# Patient Record
Sex: Male | Born: 1940
Health system: Southern US, Community
[De-identification: ages and names within clinical notes are randomized; demographics above are authoritative.]

## PROBLEM LIST (undated history)

## (undated) DIAGNOSIS — M549 Dorsalgia, unspecified: Secondary | ICD-10-CM

## (undated) DIAGNOSIS — J309 Allergic rhinitis, unspecified: Secondary | ICD-10-CM

## (undated) DIAGNOSIS — J449 Chronic obstructive pulmonary disease, unspecified: Secondary | ICD-10-CM

## (undated) DIAGNOSIS — N400 Enlarged prostate without lower urinary tract symptoms: Secondary | ICD-10-CM

## (undated) DIAGNOSIS — G4733 Obstructive sleep apnea (adult) (pediatric): Secondary | ICD-10-CM

## (undated) DIAGNOSIS — R220 Localized swelling, mass and lump, head: Secondary | ICD-10-CM

## (undated) DIAGNOSIS — R221 Localized swelling, mass and lump, neck: Secondary | ICD-10-CM

## (undated) DIAGNOSIS — D4102 Neoplasm of uncertain behavior of left kidney: Secondary | ICD-10-CM

## (undated) DIAGNOSIS — J45909 Unspecified asthma, uncomplicated: Secondary | ICD-10-CM

## (undated) DIAGNOSIS — M109 Gout, unspecified: Secondary | ICD-10-CM

## (undated) DIAGNOSIS — E119 Type 2 diabetes mellitus without complications: Secondary | ICD-10-CM

## (undated) DIAGNOSIS — I10 Essential (primary) hypertension: Secondary | ICD-10-CM

## (undated) DIAGNOSIS — C61 Malignant neoplasm of prostate: Secondary | ICD-10-CM

## (undated) DIAGNOSIS — R5381 Other malaise: Secondary | ICD-10-CM

## (undated) DIAGNOSIS — K219 Gastro-esophageal reflux disease without esophagitis: Secondary | ICD-10-CM

## (undated) DIAGNOSIS — R5383 Other fatigue: Secondary | ICD-10-CM

## (undated) DIAGNOSIS — Z86010 Personal history of colonic polyps: Secondary | ICD-10-CM

## (undated) DIAGNOSIS — E785 Hyperlipidemia, unspecified: Secondary | ICD-10-CM

## (undated) HISTORY — DX: Gastro-esophageal reflux disease without esophagitis: K21.9

## (undated) HISTORY — DX: Other malaise: R53.81

## (undated) HISTORY — DX: Chronic obstructive pulmonary disease, unspecified: J44.9

## (undated) HISTORY — DX: Allergic rhinitis, unspecified: J30.9

## (undated) HISTORY — DX: Type 2 diabetes mellitus without complications: E11.9

## (undated) HISTORY — PX: APPENDECTOMY: SHX54

## (undated) HISTORY — DX: Malignant neoplasm of prostate: C61

## (undated) HISTORY — DX: Hyperlipidemia, unspecified: E78.5

## (undated) HISTORY — DX: Other fatigue: R53.83

## (undated) HISTORY — DX: Essential (primary) hypertension: I10

## (undated) HISTORY — DX: Gout, unspecified: M10.9

## (undated) HISTORY — DX: Obstructive sleep apnea (adult) (pediatric): G47.33

## (undated) HISTORY — DX: Localized swelling, mass and lump, neck: R22.1

## (undated) HISTORY — DX: Personal history of colonic polyps: Z86.010

## (undated) HISTORY — DX: Benign prostatic hyperplasia without lower urinary tract symptoms: N40.0

## (undated) HISTORY — DX: Localized swelling, mass and lump, head: R22.0

## (undated) HISTORY — DX: Dorsalgia, unspecified: M54.9

## (undated) HISTORY — DX: Unspecified asthma, uncomplicated: J45.909

---

## 2003-11-19 ENCOUNTER — Encounter: Admission: RE | Admit: 2003-11-19 | Discharge: 2003-11-19 | Payer: Self-pay | Admitting: Internal Medicine

## 2003-11-21 ENCOUNTER — Encounter: Admission: RE | Admit: 2003-11-21 | Discharge: 2003-11-21 | Payer: Self-pay | Admitting: Internal Medicine

## 2004-04-01 ENCOUNTER — Emergency Department: Payer: Self-pay | Admitting: Emergency Medicine

## 2004-04-10 ENCOUNTER — Ambulatory Visit: Payer: Self-pay | Admitting: Gastroenterology

## 2004-04-24 ENCOUNTER — Ambulatory Visit: Payer: Self-pay | Admitting: Gastroenterology

## 2004-04-24 ENCOUNTER — Ambulatory Visit: Payer: Self-pay | Admitting: Internal Medicine

## 2004-05-16 ENCOUNTER — Emergency Department: Payer: Self-pay | Admitting: Emergency Medicine

## 2004-08-06 ENCOUNTER — Emergency Department: Payer: Self-pay | Admitting: Emergency Medicine

## 2004-09-09 ENCOUNTER — Emergency Department: Payer: Self-pay | Admitting: Emergency Medicine

## 2004-09-18 ENCOUNTER — Ambulatory Visit: Payer: Self-pay | Admitting: Internal Medicine

## 2005-01-02 ENCOUNTER — Emergency Department: Payer: Self-pay | Admitting: Emergency Medicine

## 2005-03-19 ENCOUNTER — Emergency Department: Payer: Self-pay | Admitting: Emergency Medicine

## 2005-03-25 ENCOUNTER — Ambulatory Visit: Payer: Self-pay | Admitting: Internal Medicine

## 2005-05-14 ENCOUNTER — Ambulatory Visit: Payer: Self-pay | Admitting: Internal Medicine

## 2005-07-02 ENCOUNTER — Ambulatory Visit: Payer: Self-pay | Admitting: Internal Medicine

## 2005-08-10 ENCOUNTER — Other Ambulatory Visit: Payer: Self-pay

## 2005-08-10 ENCOUNTER — Emergency Department: Payer: Self-pay | Admitting: Emergency Medicine

## 2005-09-25 ENCOUNTER — Ambulatory Visit: Payer: Self-pay | Admitting: Internal Medicine

## 2005-10-19 ENCOUNTER — Ambulatory Visit: Payer: Self-pay | Admitting: Internal Medicine

## 2005-11-24 ENCOUNTER — Ambulatory Visit: Payer: Self-pay | Admitting: Internal Medicine

## 2006-03-30 ENCOUNTER — Ambulatory Visit: Payer: Self-pay | Admitting: Internal Medicine

## 2006-03-30 LAB — CONVERTED CEMR LAB
ALT: 17 units/L (ref 0–40)
AST: 21 units/L (ref 0–37)
Albumin: 3.9 g/dL (ref 3.5–5.2)
Alkaline Phosphatase: 78 units/L (ref 39–117)
BUN: 21 mg/dL (ref 6–23)
Basophils Absolute: 0 10*3/uL (ref 0.0–0.1)
Basophils Relative: 1 % (ref 0.0–1.0)
CO2: 32 meq/L (ref 19–32)
Calcium: 9.5 mg/dL (ref 8.4–10.5)
Chloride: 105 meq/L (ref 96–112)
Chol/HDL Ratio, serum: 3.2
Cholesterol: 161 mg/dL (ref 0–200)
Creatinine, Ser: 0.9 mg/dL (ref 0.4–1.5)
Eosinophil percent: 6.6 % — ABNORMAL HIGH (ref 0.0–5.0)
GFR calc non Af Amer: 89 mL/min
Glomerular Filtration Rate, Af Am: 108 mL/min/{1.73_m2}
Glucose, Bld: 109 mg/dL — ABNORMAL HIGH (ref 70–99)
HCT: 42.9 % (ref 39.0–52.0)
HDL: 49.6 mg/dL (ref >39.0)
Hemoglobin: 13.8 g/dL (ref 13.0–17.0)
LDL Cholesterol: 102 mg/dL — ABNORMAL HIGH (ref 0–99)
Lymphocytes Relative: 35.9 % (ref 12.0–46.0)
MCHC: 32.2 g/dL (ref 30.0–36.0)
MCV: 91.6 fL (ref 78.0–100.0)
Monocytes Absolute: 0.5 10*3/uL (ref 0.2–0.7)
Monocytes Relative: 13.1 % — ABNORMAL HIGH (ref 3.0–11.0)
Neutro Abs: 1.5 10*3/uL (ref 1.4–7.7)
Neutrophils Relative %: 43.4 % (ref 43.0–77.0)
PSA: 2.11 ng/mL (ref 0.10–4.00)
Platelets: 240 10*3/uL (ref 150–400)
Potassium: 4.5 meq/L (ref 3.5–5.1)
RBC: 4.68 M/uL (ref 4.22–5.81)
RDW: 14.3 % (ref 11.5–14.6)
Sodium: 142 meq/L (ref 135–145)
TSH: 0.73 microintl units/mL (ref 0.35–5.50)
Total Bilirubin: 1 mg/dL (ref 0.3–1.2)
Total Protein: 6.4 g/dL (ref 6.0–8.3)
Triglyceride fasting, serum: 49 mg/dL (ref 0–149)
VLDL: 10 mg/dL (ref 0–40)
WBC: 3.5 10*3/uL — ABNORMAL LOW (ref 4.5–10.5)

## 2006-05-18 ENCOUNTER — Ambulatory Visit: Payer: Self-pay | Admitting: Internal Medicine

## 2006-07-21 ENCOUNTER — Ambulatory Visit: Payer: Self-pay | Admitting: Internal Medicine

## 2006-09-09 ENCOUNTER — Ambulatory Visit: Payer: Self-pay | Admitting: Internal Medicine

## 2006-09-09 LAB — CONVERTED CEMR LAB
BUN: 10 mg/dL (ref 6–23)
CO2: 34 meq/L — ABNORMAL HIGH (ref 19–32)
Calcium: 9 mg/dL (ref 8.4–10.5)
Chloride: 108 meq/L (ref 96–112)
Cholesterol: 130 mg/dL (ref 0–200)
Creatinine, Ser: 0.8 mg/dL (ref 0.4–1.5)
GFR calc Af Amer: 124 mL/min
GFR calc non Af Amer: 102 mL/min
Glucose, Bld: 85 mg/dL (ref 70–99)
HDL: 53.2 mg/dL (ref >39.0)
Hgb A1c MFr Bld: 6.3 % — ABNORMAL HIGH (ref 4.6–6.0)
LDL Cholesterol: 71 mg/dL (ref 0–99)
PSA: 2.11 ng/mL (ref 0.10–4.00)
Potassium: 4.4 meq/L (ref 3.5–5.1)
Sodium: 143 meq/L (ref 135–145)
Total CHOL/HDL Ratio: 2.4
Triglycerides: 28 mg/dL (ref 0–149)
VLDL: 6 mg/dL (ref 0–40)

## 2006-10-19 DIAGNOSIS — I10 Essential (primary) hypertension: Secondary | ICD-10-CM

## 2006-10-19 DIAGNOSIS — M109 Gout, unspecified: Secondary | ICD-10-CM | POA: Insufficient documentation

## 2006-10-19 DIAGNOSIS — E785 Hyperlipidemia, unspecified: Secondary | ICD-10-CM | POA: Insufficient documentation

## 2006-10-19 HISTORY — DX: Essential (primary) hypertension: I10

## 2006-10-19 HISTORY — DX: Hyperlipidemia, unspecified: E78.5

## 2006-10-19 HISTORY — DX: Gout, unspecified: M10.9

## 2007-03-09 ENCOUNTER — Ambulatory Visit: Payer: Self-pay | Admitting: Internal Medicine

## 2007-03-09 DIAGNOSIS — Z860100 Personal history of colon polyps, unspecified: Secondary | ICD-10-CM | POA: Insufficient documentation

## 2007-03-09 DIAGNOSIS — E119 Type 2 diabetes mellitus without complications: Secondary | ICD-10-CM

## 2007-03-09 DIAGNOSIS — N400 Enlarged prostate without lower urinary tract symptoms: Secondary | ICD-10-CM

## 2007-03-09 HISTORY — DX: Type 2 diabetes mellitus without complications: E11.9

## 2007-03-09 HISTORY — DX: Personal history of colon polyps, unspecified: Z86.0100

## 2007-03-09 HISTORY — DX: Personal history of colonic polyps: Z86.010

## 2007-03-09 HISTORY — DX: Benign prostatic hyperplasia without lower urinary tract symptoms: N40.0

## 2007-03-09 LAB — CONVERTED CEMR LAB
ALT: 25 units/L (ref 0–53)
AST: 22 units/L (ref 0–37)
Albumin: 3.9 g/dL (ref 3.5–5.2)
Alkaline Phosphatase: 71 units/L (ref 39–117)
BUN: 13 mg/dL (ref 6–23)
Bilirubin, Direct: 0.2 mg/dL (ref 0.0–0.3)
CO2: 33 meq/L — ABNORMAL HIGH (ref 19–32)
Calcium: 9.1 mg/dL (ref 8.4–10.5)
Chloride: 103 meq/L (ref 96–112)
Cholesterol: 140 mg/dL (ref 0–200)
Creatinine, Ser: 0.8 mg/dL (ref 0.4–1.5)
GFR calc Af Amer: 124 mL/min
GFR calc non Af Amer: 102 mL/min
Glucose, Bld: 114 mg/dL — ABNORMAL HIGH (ref 70–99)
HDL: 49.6 mg/dL (ref >39.0)
Hgb A1c MFr Bld: 6.1 % — ABNORMAL HIGH (ref 4.6–6.0)
LDL Cholesterol: 84 mg/dL (ref 0–99)
Potassium: 4.6 meq/L (ref 3.5–5.1)
Sodium: 140 meq/L (ref 135–145)
Total Bilirubin: 0.7 mg/dL (ref 0.3–1.2)
Total CHOL/HDL Ratio: 2.8
Total Protein: 6.7 g/dL (ref 6.0–8.3)
Triglycerides: 34 mg/dL (ref 0–149)
VLDL: 7 mg/dL (ref 0–40)

## 2007-10-05 ENCOUNTER — Ambulatory Visit: Payer: Self-pay | Admitting: Internal Medicine

## 2007-10-05 DIAGNOSIS — R5381 Other malaise: Secondary | ICD-10-CM

## 2007-10-05 DIAGNOSIS — R5383 Other fatigue: Secondary | ICD-10-CM | POA: Insufficient documentation

## 2007-10-05 HISTORY — DX: Other malaise: R53.81

## 2007-10-05 LAB — CONVERTED CEMR LAB
ALT: 25 units/L (ref 0–53)
AST: 20 units/L (ref 0–37)
Albumin: 4.1 g/dL (ref 3.5–5.2)
Alkaline Phosphatase: 64 units/L (ref 39–117)
BUN: 12 mg/dL (ref 6–23)
Basophils Absolute: 0 10*3/uL (ref 0.0–0.1)
Basophils Relative: 0.2 % (ref 0.0–3.0)
Bilirubin, Direct: 0.1 mg/dL (ref 0.0–0.3)
CO2: 34 meq/L — ABNORMAL HIGH (ref 19–32)
Calcium: 9.3 mg/dL (ref 8.4–10.5)
Chloride: 106 meq/L (ref 96–112)
Cholesterol: 128 mg/dL (ref 0–200)
Creatinine, Ser: 0.9 mg/dL (ref 0.4–1.5)
Creatinine,U: 102.1 mg/dL
Eosinophils Absolute: 0.1 10*3/uL (ref 0.0–0.7)
Eosinophils Relative: 3.5 % (ref 0.0–5.0)
GFR calc Af Amer: 108 mL/min
GFR calc non Af Amer: 89 mL/min
Glucose, Bld: 99 mg/dL (ref 70–99)
HCT: 42.8 % (ref 39.0–52.0)
HDL: 49.9 mg/dL (ref >39.0)
Hemoglobin: 14.1 g/dL (ref 13.0–17.0)
Hgb A1c MFr Bld: 6.1 % — ABNORMAL HIGH (ref 4.6–6.0)
LDL Cholesterol: 73 mg/dL (ref 0–99)
Lymphocytes Relative: 39 % (ref 12.0–46.0)
MCHC: 33 g/dL (ref 30.0–36.0)
MCV: 92.6 fL (ref 78.0–100.0)
Microalb Creat Ratio: 1 mg/g (ref 0.0–30.0)
Microalb, Ur: 0.1 mg/dL (ref 0.0–1.9)
Monocytes Absolute: 0.2 10*3/uL (ref 0.1–1.0)
Monocytes Relative: 8 % (ref 3.0–12.0)
Neutro Abs: 1.6 10*3/uL (ref 1.4–7.7)
Neutrophils Relative %: 49.3 % (ref 43.0–77.0)
PSA: 2.2 ng/mL (ref 0.10–4.00)
Platelets: 195 10*3/uL (ref 150–400)
Potassium: 4.8 meq/L (ref 3.5–5.1)
RBC: 4.62 M/uL (ref 4.22–5.81)
RDW: 13.8 % (ref 11.5–14.6)
Sodium: 144 meq/L (ref 135–145)
TSH: 1.02 microintl units/mL (ref 0.35–5.50)
Total Bilirubin: 1.1 mg/dL (ref 0.3–1.2)
Total CHOL/HDL Ratio: 2.6
Total Protein: 6.5 g/dL (ref 6.0–8.3)
Triglycerides: 25 mg/dL (ref 0–149)
VLDL: 5 mg/dL (ref 0–40)
WBC: 3.1 10*3/uL — ABNORMAL LOW (ref 4.5–10.5)

## 2007-10-20 ENCOUNTER — Ambulatory Visit: Payer: Self-pay | Admitting: Internal Medicine

## 2007-10-20 DIAGNOSIS — J449 Chronic obstructive pulmonary disease, unspecified: Secondary | ICD-10-CM

## 2007-10-20 DIAGNOSIS — J4489 Other specified chronic obstructive pulmonary disease: Secondary | ICD-10-CM

## 2007-10-20 HISTORY — DX: Chronic obstructive pulmonary disease, unspecified: J44.9

## 2007-10-20 HISTORY — DX: Other specified chronic obstructive pulmonary disease: J44.89

## 2007-10-27 ENCOUNTER — Encounter: Payer: Self-pay | Admitting: Internal Medicine

## 2007-10-27 ENCOUNTER — Ambulatory Visit: Payer: Self-pay | Admitting: Internal Medicine

## 2007-10-28 ENCOUNTER — Telehealth: Payer: Self-pay | Admitting: Internal Medicine

## 2007-11-17 ENCOUNTER — Telehealth (INDEPENDENT_AMBULATORY_CARE_PROVIDER_SITE_OTHER): Payer: Self-pay | Admitting: *Deleted

## 2007-12-27 ENCOUNTER — Ambulatory Visit: Payer: Self-pay | Admitting: Internal Medicine

## 2008-03-20 ENCOUNTER — Encounter: Payer: Self-pay | Admitting: Internal Medicine

## 2008-04-02 ENCOUNTER — Ambulatory Visit: Payer: Self-pay | Admitting: Internal Medicine

## 2008-04-03 LAB — CONVERTED CEMR LAB
BUN: 14 mg/dL (ref 6–23)
CO2: 31 meq/L (ref 19–32)
Calcium: 9.8 mg/dL (ref 8.4–10.5)
Chloride: 98 meq/L (ref 96–112)
Cholesterol: 132 mg/dL (ref 0–200)
Creatinine, Ser: 1.1 mg/dL (ref 0.4–1.5)
GFR calc Af Amer: 85 mL/min
GFR calc non Af Amer: 71 mL/min
Glucose, Bld: 93 mg/dL (ref 70–99)
HDL: 54.3 mg/dL (ref >39.0)
Hgb A1c MFr Bld: 6.4 % — ABNORMAL HIGH (ref 4.6–6.0)
LDL Cholesterol: 71 mg/dL (ref 0–99)
Potassium: 4.6 meq/L (ref 3.5–5.1)
Sodium: 139 meq/L (ref 135–145)
Total CHOL/HDL Ratio: 2.4
Triglycerides: 33 mg/dL (ref 0–149)
VLDL: 7 mg/dL (ref 0–40)

## 2008-04-18 ENCOUNTER — Ambulatory Visit: Payer: Self-pay | Admitting: Internal Medicine

## 2008-04-18 DIAGNOSIS — M549 Dorsalgia, unspecified: Secondary | ICD-10-CM | POA: Insufficient documentation

## 2008-04-18 HISTORY — DX: Dorsalgia, unspecified: M54.9

## 2008-05-07 ENCOUNTER — Ambulatory Visit: Payer: Self-pay | Admitting: Gastroenterology

## 2008-05-16 ENCOUNTER — Encounter: Payer: Self-pay | Admitting: Gastroenterology

## 2008-05-16 ENCOUNTER — Ambulatory Visit: Payer: Self-pay | Admitting: Gastroenterology

## 2008-05-16 LAB — HM COLONOSCOPY

## 2008-05-18 ENCOUNTER — Encounter: Payer: Self-pay | Admitting: Gastroenterology

## 2008-10-04 ENCOUNTER — Ambulatory Visit: Payer: Self-pay | Admitting: Internal Medicine

## 2008-10-04 DIAGNOSIS — J45909 Unspecified asthma, uncomplicated: Secondary | ICD-10-CM | POA: Insufficient documentation

## 2008-10-04 DIAGNOSIS — J309 Allergic rhinitis, unspecified: Secondary | ICD-10-CM | POA: Insufficient documentation

## 2008-10-04 HISTORY — DX: Allergic rhinitis, unspecified: J30.9

## 2008-10-04 HISTORY — DX: Unspecified asthma, uncomplicated: J45.909

## 2008-10-04 LAB — CONVERTED CEMR LAB
ALT: 24 units/L (ref 0–53)
AST: 21 units/L (ref 0–37)
Albumin: 4.1 g/dL (ref 3.5–5.2)
Alkaline Phosphatase: 57 units/L (ref 39–117)
BUN: 15 mg/dL (ref 6–23)
Basophils Absolute: 0 10*3/uL (ref 0.0–0.1)
Basophils Relative: 1.3 % (ref 0.0–3.0)
Bilirubin Urine: NEGATIVE
Bilirubin, Direct: 0.1 mg/dL (ref 0.0–0.3)
CO2: 32 meq/L (ref 19–32)
Calcium: 9 mg/dL (ref 8.4–10.5)
Chloride: 105 meq/L (ref 96–112)
Cholesterol: 128 mg/dL (ref 0–200)
Creatinine, Ser: 0.9 mg/dL (ref 0.4–1.5)
Creatinine,U: 154.1 mg/dL
Eosinophils Absolute: 0.2 10*3/uL (ref 0.0–0.7)
Eosinophils Relative: 5.2 % — ABNORMAL HIGH (ref 0.0–5.0)
Folate: 16.9 ng/mL
GFR calc non Af Amer: 107.29 mL/min (ref >60)
Glucose, Bld: 135 mg/dL — ABNORMAL HIGH (ref 70–99)
HCT: 42.1 % (ref 39.0–52.0)
HDL: 48.4 mg/dL (ref >39.00)
Hemoglobin, Urine: NEGATIVE
Hemoglobin: 13.9 g/dL (ref 13.0–17.0)
Hgb A1c MFr Bld: 6.4 % (ref 4.6–6.5)
Ketones, ur: NEGATIVE mg/dL
LDL Cholesterol: 74 mg/dL (ref 0–99)
Leukocytes, UA: NEGATIVE
Lymphocytes Relative: 36.1 % (ref 12.0–46.0)
Lymphs Abs: 1.2 10*3/uL (ref 0.7–4.0)
MCHC: 33.1 g/dL (ref 30.0–36.0)
MCV: 93.6 fL (ref 78.0–100.0)
Microalb Creat Ratio: 1.9 mg/g (ref 0.0–30.0)
Microalb, Ur: 0.3 mg/dL (ref 0.0–1.9)
Monocytes Absolute: 0.4 10*3/uL (ref 0.1–1.0)
Monocytes Relative: 12.4 % — ABNORMAL HIGH (ref 3.0–12.0)
Neutro Abs: 1.4 10*3/uL (ref 1.4–7.7)
Neutrophils Relative %: 45 % (ref 43.0–77.0)
Nitrite: NEGATIVE
PSA: 2.69 ng/mL (ref 0.10–4.00)
Platelets: 181 10*3/uL (ref 150.0–400.0)
Potassium: 4.6 meq/L (ref 3.5–5.1)
RBC: 4.5 M/uL (ref 4.22–5.81)
RDW: 13.7 % (ref 11.5–14.6)
Sodium: 141 meq/L (ref 135–145)
Specific Gravity, Urine: 1.01 (ref 1.000–1.030)
TSH: 1.23 microintl units/mL (ref 0.35–5.50)
Total Bilirubin: 0.7 mg/dL (ref 0.3–1.2)
Total CHOL/HDL Ratio: 3
Total Protein, Urine: NEGATIVE mg/dL
Total Protein: 6.4 g/dL (ref 6.0–8.3)
Triglycerides: 27 mg/dL (ref 0.0–149.0)
Uric Acid, Serum: 4.4 mg/dL (ref 4.0–7.8)
Urine Glucose: NEGATIVE mg/dL
Urobilinogen, UA: 0.2 (ref 0.0–1.0)
VLDL: 5.4 mg/dL (ref 0.0–40.0)
Vitamin B-12: 720 pg/mL (ref 211–911)
WBC: 3.2 10*3/uL — ABNORMAL LOW (ref 4.5–10.5)
pH: 7.5 (ref 5.0–8.0)

## 2008-10-08 ENCOUNTER — Telehealth (INDEPENDENT_AMBULATORY_CARE_PROVIDER_SITE_OTHER): Payer: Self-pay | Admitting: *Deleted

## 2008-10-18 ENCOUNTER — Telehealth: Payer: Self-pay | Admitting: Internal Medicine

## 2008-10-29 ENCOUNTER — Telehealth: Payer: Self-pay | Admitting: Internal Medicine

## 2009-04-01 ENCOUNTER — Ambulatory Visit: Payer: Self-pay | Admitting: Internal Medicine

## 2009-04-01 LAB — CONVERTED CEMR LAB
BUN: 8 mg/dL (ref 6–23)
CO2: 31 meq/L (ref 19–32)
Calcium: 9.2 mg/dL (ref 8.4–10.5)
Chloride: 102 meq/L (ref 96–112)
Cholesterol: 131 mg/dL (ref 0–200)
Creatinine, Ser: 1 mg/dL (ref 0.4–1.5)
GFR calc non Af Amer: 94.88 mL/min (ref >60)
Glucose, Bld: 98 mg/dL (ref 70–99)
HDL: 45.6 mg/dL (ref >39.00)
Hgb A1c MFr Bld: 5.9 % (ref 4.6–6.5)
LDL Cholesterol: 82 mg/dL (ref 0–99)
Potassium: 4.5 meq/L (ref 3.5–5.1)
Sodium: 139 meq/L (ref 135–145)
Total CHOL/HDL Ratio: 3
Triglycerides: 16 mg/dL (ref 0.0–149.0)
VLDL: 3.2 mg/dL (ref 0.0–40.0)

## 2009-10-08 ENCOUNTER — Ambulatory Visit: Payer: Self-pay | Admitting: Internal Medicine

## 2010-01-09 ENCOUNTER — Ambulatory Visit: Payer: Self-pay | Admitting: Internal Medicine

## 2010-01-09 DIAGNOSIS — R220 Localized swelling, mass and lump, head: Secondary | ICD-10-CM

## 2010-01-09 HISTORY — DX: Localized swelling, mass and lump, head: R22.0

## 2010-01-21 ENCOUNTER — Encounter: Payer: Self-pay | Admitting: Internal Medicine

## 2010-01-27 ENCOUNTER — Telehealth: Payer: Self-pay | Admitting: Internal Medicine

## 2010-02-21 ENCOUNTER — Telehealth: Payer: Self-pay | Admitting: Internal Medicine

## 2010-03-07 ENCOUNTER — Telehealth: Payer: Self-pay | Admitting: Internal Medicine

## 2010-04-20 LAB — CONVERTED CEMR LAB
ALT: 21 units/L (ref 0–53)
AST: 21 units/L (ref 0–37)
Albumin: 4.3 g/dL (ref 3.5–5.2)
Alkaline Phosphatase: 54 units/L (ref 39–117)
BUN: 23 mg/dL (ref 6–23)
Basophils Absolute: 0 10*3/uL (ref 0.0–0.1)
Basophils Relative: 0.7 % (ref 0.0–3.0)
Bilirubin Urine: NEGATIVE
Bilirubin, Direct: 0.2 mg/dL (ref 0.0–0.3)
CO2: 31 meq/L (ref 19–32)
Calcium: 9.1 mg/dL (ref 8.4–10.5)
Chloride: 108 meq/L (ref 96–112)
Cholesterol: 128 mg/dL (ref 0–200)
Creatinine, Ser: 0.9 mg/dL (ref 0.4–1.5)
Creatinine,U: 132.5 mg/dL
Eosinophils Absolute: 0.1 10*3/uL (ref 0.0–0.7)
Eosinophils Relative: 2.3 % (ref 0.0–5.0)
Folate: >20 ng/mL
GFR calc non Af Amer: 103.01 mL/min (ref >60)
Glucose, Bld: 89 mg/dL (ref 70–99)
HCT: 41.1 % (ref 39.0–52.0)
HDL: 48.2 mg/dL (ref >39.00)
Hemoglobin, Urine: NEGATIVE
Hemoglobin: 13.7 g/dL (ref 13.0–17.0)
Hgb A1c MFr Bld: 6.1 % (ref 4.6–6.5)
Iron: 151 ug/dL (ref 42–165)
Ketones, ur: NEGATIVE mg/dL
LDL Cholesterol: 73 mg/dL (ref 0–99)
Leukocytes, UA: NEGATIVE
Lymphocytes Relative: 29.5 % (ref 12.0–46.0)
Lymphs Abs: 1.1 10*3/uL (ref 0.7–4.0)
MCHC: 33.4 g/dL (ref 30.0–36.0)
MCV: 94.7 fL (ref 78.0–100.0)
Microalb Creat Ratio: 0.8 mg/g (ref 0.0–30.0)
Microalb, Ur: 1 mg/dL (ref 0.0–1.9)
Monocytes Absolute: 0.4 10*3/uL (ref 0.1–1.0)
Monocytes Relative: 9.3 % (ref 3.0–12.0)
Neutro Abs: 2.2 10*3/uL (ref 1.4–7.7)
Neutrophils Relative %: 58.2 % (ref 43.0–77.0)
Nitrite: NEGATIVE
PSA: 2.23 ng/mL (ref 0.10–4.00)
Platelets: 194 10*3/uL (ref 150.0–400.0)
Potassium: 4.8 meq/L (ref 3.5–5.1)
RBC: 4.34 M/uL (ref 4.22–5.81)
RDW: 14.8 % — ABNORMAL HIGH (ref 11.5–14.6)
Saturation Ratios: 56.5 % — ABNORMAL HIGH (ref 20.0–50.0)
Sed Rate: 9 mm/hr (ref 0–22)
Sodium: 140 meq/L (ref 135–145)
Specific Gravity, Urine: 1.015 (ref 1.000–1.030)
TSH: 0.98 microintl units/mL (ref 0.35–5.50)
Total Bilirubin: 0.8 mg/dL (ref 0.3–1.2)
Total CHOL/HDL Ratio: 3
Total Protein, Urine: NEGATIVE mg/dL
Total Protein: 6.5 g/dL (ref 6.0–8.3)
Transferrin: 191 mg/dL — ABNORMAL LOW (ref 212.0–360.0)
Triglycerides: 32 mg/dL (ref 0.0–149.0)
Urine Glucose: NEGATIVE mg/dL
Urobilinogen, UA: 2 (ref 0.0–1.0)
VLDL: 6.4 mg/dL (ref 0.0–40.0)
Vit D, 25-Hydroxy: 33 ng/mL (ref 30–89)
Vitamin B-12: 667 pg/mL (ref 211–911)
WBC: 3.8 10*3/uL — ABNORMAL LOW (ref 4.5–10.5)
pH: 7 (ref 5.0–8.0)

## 2010-04-23 ENCOUNTER — Ambulatory Visit (INDEPENDENT_AMBULATORY_CARE_PROVIDER_SITE_OTHER): Payer: Medicare Other | Admitting: Internal Medicine

## 2010-04-23 ENCOUNTER — Other Ambulatory Visit: Payer: Self-pay | Admitting: Internal Medicine

## 2010-04-23 ENCOUNTER — Ambulatory Visit: Admit: 2010-04-23 | Payer: Self-pay | Admitting: Internal Medicine

## 2010-04-23 ENCOUNTER — Encounter: Payer: Self-pay | Admitting: Internal Medicine

## 2010-04-23 DIAGNOSIS — E119 Type 2 diabetes mellitus without complications: Secondary | ICD-10-CM

## 2010-04-23 DIAGNOSIS — J45909 Unspecified asthma, uncomplicated: Secondary | ICD-10-CM

## 2010-04-23 DIAGNOSIS — E785 Hyperlipidemia, unspecified: Secondary | ICD-10-CM

## 2010-04-23 DIAGNOSIS — I10 Essential (primary) hypertension: Secondary | ICD-10-CM

## 2010-04-23 LAB — BASIC METABOLIC PANEL
BUN: 19 mg/dL (ref 6–23)
CO2: 31 mEq/L (ref 19–32)
Calcium: 9.6 mg/dL (ref 8.4–10.5)
Chloride: 106 mEq/L (ref 96–112)
Creatinine, Ser: 1.2 mg/dL (ref 0.4–1.5)
GFR: 79.7 mL/min (ref >60.00)
Glucose, Bld: 100 mg/dL — ABNORMAL HIGH (ref 70–99)
Potassium: 4.8 mEq/L (ref 3.5–5.1)
Sodium: 143 mEq/L (ref 135–145)

## 2010-04-23 LAB — LIPID PANEL
Cholesterol: 141 mg/dL (ref 0–200)
HDL: 51.5 mg/dL (ref >39.00)
LDL Cholesterol: 84 mg/dL (ref 0–99)
Total CHOL/HDL Ratio: 3
Triglycerides: 27 mg/dL (ref 0.0–149.0)
VLDL: 5.4 mg/dL (ref 0.0–40.0)

## 2010-04-23 LAB — HEMOGLOBIN A1C: Hgb A1c MFr Bld: 6.4 % (ref 4.6–6.5)

## 2010-04-24 NOTE — Progress Notes (Signed)
Summary: breathing test  Phone Note Call from Patient Call back at Home Phone [phone]   Caller: Patient/[phone] Call For: dr Jonny Ruiz Summary of Call: per pt walk in triage " Dr Jonny Ruiz Please give me  a hand copy of my breathing test Results ,and a brief explanation of what it means. I  will need it for the [place]'' Initial call taken by: Shelbie Proctor,  October 28, 2007 3:31 PM  Follow-up for Phone Call        to [facility] - please provide PFT results to pt when available - appears they are not availble yet Follow-up by: Corwin Levins MD,  October 28, 2007 3:52 PM  Additional Follow-up for Phone Call Additional follow up Details #1::        Results faxed from pulm. and placed on MD desk for review Additional Follow-up by: Rock Nephew CMA,  November 01, 2007 10:21 AM    Additional Follow-up for Phone Call Additional follow up Details #2::    test c/w moderate COPD - cont same meds - ok for copy to pt Follow-up by: Corwin Levins MD,  November 01, 2007 1:45 PM  Additional Follow-up for Phone Call Additional follow up Details #3:: Details for Additional Follow-up Action Taken: Patient notified and copy of results mailed to pt and sent to scanning Additional Follow-up by: Rock Nephew CMA,  November 01, 2007 2:58 PM

## 2010-04-24 NOTE — Assessment & Plan Note (Signed)
Summary: 6 MTH FU--STC   Vital Signs:  Patient profile:   70 year old male Height:      65 inches Weight:      196 pounds BMI:     32.73 O2 Sat:      97 % on Room air Temp:     97.2 degrees F oral Pulse rate:   74 / minute BP sitting:   102 / 58  (left arm) Cuff size:   regular  Vitals Entered By: Zella Ball Ewing CMA (AAMA) (October 08, 2009 8:00 AM)  O2 Flow:  Room air  CC: 6 month ROV/RE   CC:  6 month ROV/RE.  History of Present Illness: overall doing well, lost approx 1 lb /stable; Pt denies CP, sob, doe, wheezing, orthopnea, pnd, worsening LE edema, palps, dizziness or syncope  Pt denies new neuro symptoms such as headache, facial or extremity weakness  Pt denies polydipsia, polyuria, or low sugar symptoms such as shakiness improved with eating.  Overall good compliance with meds, trying to follow low chol, DM diet, wt stable, little excercise however  CBG's in the low 1010's.  Does c/o right lower back pain for 3 days, mild , without radiation, or LE pain, weak, numb , gait change or fall or injury  Here for wellness Diet: Heart Healthy or DM if diabetic Physical Activities:  daily gym - treadmill and elliptical Depression/mood screen: Negative Hearing: Intact bilateral Visual Acuity: Grossly normal, gets exam yearly - just saw 3 mo with new glasses ADL's: Capable  Fall Risk: None Home Safety: Good Cognitive Impairment:  Gen appearance, affect, speech, memory, attention & motor skills grossly intact End-of-Life Planning: Advance directive - Full code/I agree   Preventive Screening-Counseling & Management      Drug Use:  no.    Problems Prior to Update: 1)  Asthma  (ICD-493.90) 2)  Allergic Rhinitis  (ICD-477.9) 3)  Fatigue  (ICD-780.79) 4)  Back Pain  (ICD-724.5) 5)  COPD  (ICD-496) 6)  Special Screening Malig Neoplasms Other Sites  (ICD-V76.49) 7)  Fatigue  (ICD-780.79) 8)  Hepatotoxicity, Drug-induced, Risk of  (ICD-V58.69) 9)  Diabetes Mellitus, Type II   (ICD-250.00) 10)  Benign Prostatic Hypertrophy  (ICD-600.00) 11)  Colonic Polyps, Hx of  (ICD-V12.72) 12)  Hypertension  (ICD-401.9) 13)  Hyperlipidemia  (ICD-272.4) 14)  Gout  (ICD-274.9)  Medications Prior to Update: 1)  Indomethacin 50 Mg  Caps (Indomethacin) .Marland Kitchen.. 1 By Mouth Three Times A Day Prn 2)  Lipitor 10 Mg  Tabs (Atorvastatin Calcium) .Marland Kitchen.. 1 By Mouth Once Daily 3)  Amlodipine Besy-Benazepril Hcl 5-20 Mg  Caps (Amlodipine Besy-Benazepril Hcl) .Marland Kitchen.. 1 By Mouth Once Daily 4)  Allopurinol 300 Mg  Tabs (Allopurinol) .Marland Kitchen.. 1 By Mouth Once Daily 5)  Ecotrin Low Strength 81 Mg  Tbec (Aspirin) .Marland Kitchen.. 1 By Mouth Qd 6)  Levitra 20 Mg Tabs (Vardenafil Hcl) .Marland Kitchen.. 1 By Mouth Every Other Day As Needed 7)  Proair Hfa 108 (90 Base) Mcg/act  Aers (Albuterol Sulfate) .... 2 Puffs Qid As Needed 8)  Symbicort 160-4.5 Mcg/act Aero (Budesonide-Formoterol Fumarate) .... 2 Puffs Two Times A Day 9)  Uroxatral 10 Mg Xr24h-Tab (Alfuzosin Hcl) .Marland Kitchen.. 1 By Mouth Daily With A Meal 10)  Fish Oil 500 Mg Caps (Omega-3 Fatty Acids) .Marland Kitchen.. 1 By Mouth Once Daily 11)  Cetirizine Hcl 10 Mg Tabs (Cetirizine Hcl) .Marland Kitchen.. 1 By Mouth Once Daily As Needed Allergies  Current Medications (verified): 1)  Indomethacin 50 Mg  Caps (Indomethacin) .Marland KitchenMarland KitchenMarland Kitchen 1  By Mouth Three Times A Day Prn 2)  Lipitor 10 Mg  Tabs (Atorvastatin Calcium) .Marland Kitchen.. 1 By Mouth Once Daily 3)  Amlodipine Besy-Benazepril Hcl 5-20 Mg  Caps (Amlodipine Besy-Benazepril Hcl) .Marland Kitchen.. 1 By Mouth Once Daily 4)  Allopurinol 300 Mg  Tabs (Allopurinol) .Marland Kitchen.. 1 By Mouth Once Daily 5)  Ecotrin Low Strength 81 Mg  Tbec (Aspirin) .Marland Kitchen.. 1 By Mouth Qd 6)  Levitra 20 Mg Tabs (Vardenafil Hcl) .Marland Kitchen.. 1 By Mouth Every Other Day As Needed 7)  Proair Hfa 108 (90 Base) Mcg/act  Aers (Albuterol Sulfate) .... 2 Puffs Qid As Needed 8)  Symbicort 160-4.5 Mcg/act Aero (Budesonide-Formoterol Fumarate) .... 2 Puffs Two Times A Day 9)  Uroxatral 10 Mg Xr24h-Tab (Alfuzosin Hcl) .Marland Kitchen.. 1 By Mouth Daily With A  Meal 10)  Fish Oil 500 Mg Caps (Omega-3 Fatty Acids) .Marland Kitchen.. 1 By Mouth Once Daily 11)  Cetirizine Hcl 10 Mg Tabs (Cetirizine Hcl) .Marland Kitchen.. 1 By Mouth Once Daily As Needed Allergies  Allergies (verified): No Known Drug Allergies  Past History:  Past Surgical History: Last updated: 03/09/2007 Appendectomy  Family History: Last updated: 03/09/2007 mother with HTN uncle with cancer  Social History: Last updated: 10/08/2009 Former Smoker Alcohol use-no Single no children reitred - [place] [employer]  - aug 2009 Drug use-no  Risk Factors: Smoking Status: quit (03/09/2007)  Past Medical History: Gout Hyperlipidemia Hypertension Colonic polyps, hx of Benign prostatic hypertrophy E.D. Diabetes mellitus, type II - diet COPD Allergic rhinitis Asthma  MD roster:   urology/Dr Evelene Croon - [place]                    GI/Dr Arlyce Dice                    derm/ [place] physician                     eye/optometry - Ginette Otto  Social History: Reviewed history from 10/04/2008 and no changes required. Former Smoker Alcohol use-no Single no children reitred - [place] [employer]  - aug 2009 Drug use-no Drug Use:  no  Review of Systems  The patient denies anorexia, fever, weight loss, weight gain, vision loss, decreased hearing, hoarseness, chest pain, syncope, dyspnea on exertion, peripheral edema, prolonged cough, headaches, hemoptysis, abdominal pain, melena, hematochezia, severe indigestion/heartburn, hematuria, muscle weakness, suspicious skin lesions, transient blindness, difficulty walking, depression, unusual weight change, abnormal bleeding, enlarged lymph nodes, and angioedema.         all otherwise negative per pt -    Physical Exam  General:  overweight-appearing.   Head:  normocephalic and atraumatic.   Eyes:  vision grossly intact, pupils equal, and pupils round.   Ears:  R ear normal and L ear normal.   Nose:  no external deformity and no nasal discharge.   Mouth:  no  gingival abnormalities and pharynx pink and moist.   Neck:  supple and no masses.   Lungs:  normal respiratory effort and normal breath sounds.   Heart:  normal rate and regular rhythm.   Abdomen:  soft, non-tender, and normal bowel sounds.   Msk:  no joint tenderness and no joint swelling.  , mild right lumbar paravertebral tender Extremities:  no edema, no erythema  Neurologic:  cranial nerves II-XII intact and strength normal in all extremities.     Impression & Recommendations:  Problem # 1:  Preventive Health Care (ICD-V70.0)  Overall doing well, age appropriate education and counseling updated  and referral for appropriate preventive services done unless declined, immunizations up to date or declined, diet counseling done if overweight, urged to quit smoking if smokes , most recent labs reviewed and current ordered if appropriate, ecg reviewed or declined (interpretation per ECG scanned in the EMR if done); information regarding Medicare Prevention requirements given if appropriate; speciality referrals updated as appropriate   Orders: EKG w/ Interpretation (93000) Prescription Created Electronically [phone])  Problem # 2:  BACK PAIN (ICD-724.5)  His updated medication list for this problem includes:    Indomethacin 50 Mg Caps (Indomethacin) .Marland Kitchen... 1 by mouth three times a day prn    Ecotrin Low Strength 81 Mg Tbec (Aspirin) .Marland Kitchen... 1 by mouth qd c/w msk strain - Continue all previous medications as before this visit   Problem # 3:  DIABETES MELLITUS, TYPE II (ICD-250.00)  His updated medication list for this problem includes:    Amlodipine Besy-benazepril Hcl 5-20 Mg Caps (Amlodipine besy-benazepril hcl) .Marland Kitchen... 1 by mouth once daily    Ecotrin Low Strength 81 Mg Tbec (Aspirin) .Marland Kitchen... 1 by mouth qd  Labs Reviewed: Creat: 1.0 (04/01/2009)    Reviewed HgBA1c results: 5.9 (04/01/2009)  6.4 (10/04/2008) stable overall by hx and exam, ok to continue meds/tx as is    Orders: TLB-Microalbumin/Creat Ratio, Urine (82043-MALB) TLB-Lipid Panel (80061-LIPID) TLB-A1C / Hgb A1C (Glycohemoglobin) (83036-A1C)  Problem # 4:  HYPERTENSION (ICD-401.9)  His updated medication list for this problem includes:    Amlodipine Besy-benazepril Hcl 5-20 Mg Caps (Amlodipine besy-benazepril hcl) .Marland Kitchen... 1 by mouth once daily  BP today: 102/58 Prior BP: 132/82 (04/01/2009)  Labs Reviewed: K+: 4.5 (04/01/2009) Creat: : 1.0 (04/01/2009)   Chol: 131 (04/01/2009)   HDL: 45.60 (04/01/2009)   LDL: 82 (04/01/2009)   TG: 16.0 (04/01/2009) stable overall by hx and exam, ok to continue meds/tx as is   Problem # 5:  HYPERLIPIDEMIA (ICD-272.4)  His updated medication list for this problem includes:    Lipitor 10 Mg Tabs (Atorvastatin calcium) .Marland Kitchen... 1 by mouth once daily  Labs Reviewed: SGOT: 21 (10/04/2008)   SGPT: 24 (10/04/2008)   HDL:45.60 (04/01/2009), 48.40 (10/04/2008)  LDL:82 (04/01/2009), 74 (10/04/2008)  Chol:131 (04/01/2009), 128 (10/04/2008)  Trig:16.0 (04/01/2009), 27.0 (10/04/2008) stable overall by hx and exam, ok to continue meds/tx as is   Problem # 6:  FATIGUE (ICD-780.79)  mild,  but very active and denies OSA symptoms;  exam benign, to check labs below; follow with expectant management   Orders: TLB-BMP (Basic Metabolic Panel-BMET) (80048-METABOL) TLB-CBC Platelet - w/Differential (85025-CBCD) TLB-Hepatic/Liver Function Pnl (80076-HEPATIC) TLB-TSH (Thyroid Stimulating Hormone) (84443-TSH) TLB-Sedimentation Rate (ESR) (85652-ESR) TLB-IBC Pnl (Iron/FE;Transferrin) (83550-IBC) TLB-B12 + Folate Pnl (60454_09811-B14/NWG) TLB-Udip ONLY (81003-UDIP) T-Vitamin D (25-Hydroxy) (95621-30865)  Complete Medication List: 1)  Indomethacin 50 Mg Caps (Indomethacin) .Marland Kitchen.. 1 by mouth three times a day prn 2)  Lipitor 10 Mg Tabs (Atorvastatin calcium) .Marland Kitchen.. 1 by mouth once daily 3)  Amlodipine Besy-benazepril Hcl 5-20 Mg Caps (Amlodipine besy-benazepril hcl) .Marland Kitchen.. 1 by  mouth once daily 4)  Allopurinol 300 Mg Tabs (Allopurinol) .Marland Kitchen.. 1 by mouth once daily 5)  Ecotrin Low Strength 81 Mg Tbec (Aspirin) .Marland Kitchen.. 1 by mouth qd 6)  Levitra 20 Mg Tabs (Vardenafil hcl) .Marland Kitchen.. 1 by mouth every other day as needed 7)  Proair Hfa 108 (90 Base) Mcg/act Aers (Albuterol sulfate) .... 2 puffs qid as needed 8)  Symbicort 160-4.5 Mcg/act Aero (Budesonide-formoterol fumarate) .... 2 puffs two times a day 9)  Uroxatral 10 Mg Xr24h-tab (Alfuzosin hcl) .Marland KitchenMarland KitchenMarland Kitchen  1 by mouth daily with a meal 10)  Fish Oil 500 Mg Caps (Omega-3 fatty acids) .Marland Kitchen.. 1 by mouth once daily 11)  Cetirizine Hcl 10 Mg Tabs (Cetirizine hcl) .Marland Kitchen.. 1 by mouth once daily as needed allergies  Other Orders: TLB-PSA (Prostate Specific Antigen) (84153-PSA)  Patient Instructions: 1)  Continue all previous medications as before this visit  2)  Please go to the Lab in the basement for your blood and/or urine tests today 3)  Your EKG was good today 4)  Your prevention measures are up to date 5)  Please schedule a follow-up appointment in 6 months.

## 2010-04-24 NOTE — Letter (Signed)
Summary[facility]  [facility]   Imported By: Lester Cornelius 10/28/2007 11:21:12  _____________________________________________________________________  External Attachment:    Type:   Image     Comment:   External Document

## 2010-04-24 NOTE — Assessment & Plan Note (Signed)
Summary: 6 MO ROV /[facility] $50   Vital Signs:  Patient profile:   70 year old male Height:      65 inches Weight:      197 pounds BMI:     32.90 O2 Sat:      98 % on Room air Temp:     98.8 degrees F oral Pulse rate:   74 / minute BP sitting:   132 / 82  (left arm) Cuff size:   large  Vitals Entered ByMarland Kitchen Zella Ball Ewing (April 01, 2009 8:06 AM)  O2 Flow:  Room air  Preventive Care Screening  Last Flu Shot:    Date:  01/21/2009    Results:  given   CC: 6 Mo ROV/RE   CC:  6 Mo ROV/RE.  History of Present Illness: overall lsot 10 lbs since last visit;  Pt denies CP, sob, doe, wheezing, orthopnea, pnd, worsening LE edema, palps, dizziness or syncope  Pt denies new neuro symptoms such as headache, facial or extremity weakness .Pt denies polydipsia, polyuria, or low sugar symptoms such as shakiness improved with eating.  Overall good compliance with meds, trying to follow low chol, DM diet, trying to  excercise more - 2 to 3 times per wk at the gym.  CBG's running in the low 100's; no recent gout on the allopurino.l.  Asthma working out well pon current meds without incrdased inhaler use or night time awakenings.    Problems Prior to Update: 1)  Asthma  (ICD-493.90) 2)  Allergic Rhinitis  (ICD-477.9) 3)  Fatigue  (ICD-780.79) 4)  Back Pain  (ICD-724.5) 5)  COPD  (ICD-496) 6)  Special Screening Malig Neoplasms Other Sites  (ICD-V76.49) 7)  Fatigue  (ICD-780.79) 8)  Hepatotoxicity, Drug-induced, Risk of  (ICD-V58.69) 9)  Diabetes Mellitus, Type II  (ICD-250.00) 10)  Benign Prostatic Hypertrophy  (ICD-600.00) 11)  Colonic Polyps, Hx of  (ICD-V12.72) 12)  Hypertension  (ICD-401.9) 13)  Hyperlipidemia  (ICD-272.4) 14)  Gout  (ICD-274.9)  Medications Prior to Update: 1)  Indomethacin 50 Mg  Caps (Indomethacin) .Marland Kitchen.. 1 By Mouth Three Times A Day Prn 2)  Lipitor 10 Mg  Tabs (Atorvastatin Calcium) .Marland Kitchen.. 1 By Mouth Once Daily 3)  Amlodipine Besy-Benazepril Hcl 5-20 Mg  Caps (Amlodipine  Besy-Benazepril Hcl) .Marland Kitchen.. 1 By Mouth Once Daily 4)  Allopurinol 300 Mg  Tabs (Allopurinol) .Marland Kitchen.. 1 By Mouth Once Daily 5)  Ecotrin Low Strength 81 Mg  Tbec (Aspirin) .Marland Kitchen.. 1 By Mouth Qd 6)  Levitra 20 Mg Tabs (Vardenafil Hcl) .Marland Kitchen.. 1 By Mouth Every Other Day As Needed 7)  Proair Hfa 108 (90 Base) Mcg/act  Aers (Albuterol Sulfate) .... 2 Puffs Qid As Needed 8)  Symbicort 160-4.5 Mcg/act Aero (Budesonide-Formoterol Fumarate) .... 2 Puffs Two Times A Day 9)  Uroxatral 10 Mg Xr24h-Tab (Alfuzosin Hcl) .Marland Kitchen.. 1 By Mouth Daily With A Meal 10)  Fish Oil 500 Mg Caps (Omega-3 Fatty Acids) .Marland Kitchen.. 1 By Mouth Once Daily 11)  Cetirizine Hcl 10 Mg Tabs (Cetirizine Hcl) .Marland Kitchen.. 1 By Mouth Once Daily As Needed Allergies  Current Medications (verified): 1)  Indomethacin 50 Mg  Caps (Indomethacin) .Marland Kitchen.. 1 By Mouth Three Times A Day Prn 2)  Lipitor 10 Mg  Tabs (Atorvastatin Calcium) .Marland Kitchen.. 1 By Mouth Once Daily 3)  Amlodipine Besy-Benazepril Hcl 5-20 Mg  Caps (Amlodipine Besy-Benazepril Hcl) .Marland Kitchen.. 1 By Mouth Once Daily 4)  Allopurinol 300 Mg  Tabs (Allopurinol) .Marland Kitchen.. 1 By Mouth Once Daily 5)  Ecotrin Low Strength 81  Mg  Tbec (Aspirin) .Marland Kitchen.. 1 By Mouth Qd 6)  Levitra 20 Mg Tabs (Vardenafil Hcl) .Marland Kitchen.. 1 By Mouth Every Other Day As Needed 7)  Proair Hfa 108 (90 Base) Mcg/act  Aers (Albuterol Sulfate) .... 2 Puffs Qid As Needed 8)  Symbicort 160-4.5 Mcg/act Aero (Budesonide-Formoterol Fumarate) .... 2 Puffs Two Times A Day 9)  Uroxatral 10 Mg Xr24h-Tab (Alfuzosin Hcl) .Marland Kitchen.. 1 By Mouth Daily With A Meal 10)  Fish Oil 500 Mg Caps (Omega-3 Fatty Acids) .Marland Kitchen.. 1 By Mouth Once Daily 11)  Cetirizine Hcl 10 Mg Tabs (Cetirizine Hcl) .Marland Kitchen.. 1 By Mouth Once Daily As Needed Allergies  Allergies (verified): No Known Drug Allergies  Past History:  Past Medical History: Last updated: 10/04/2008 Gout Hyperlipidemia Hypertension Colonic polyps, hx of Benign prostatic hypertrophy E.D. Diabetes mellitus, type II - diet COPD Allergic  rhinitis Asthma  Past Surgical History: Last updated: 03/09/2007 Appendectomy  Social History: Last updated: 10/04/2008 Former Smoker Alcohol use-no Single no children reitred - [place] [employer]  - aug 2009  Risk Factors: Smoking Status: quit (03/09/2007)  Review of Systems       all otherwise negative per pt - 12 system review done  Physical Exam  General:  alert and overweight-appearing.   Head:  normocephalic and atraumatic.   Eyes:  vision grossly intact, pupils equal, and pupils round.   Ears:  R ear normal and L ear normal.   Nose:  no external deformity and no nasal discharge.   Mouth:  no gingival abnormalities and pharynx pink and moist.   Neck:  supple and no masses.   Lungs:  normal respiratory effort and normal breath sounds.   Heart:  normal rate and regular rhythm.   Extremities:  no edema, no erythema    Impression & Recommendations:  Problem # 1:  DIABETES MELLITUS, TYPE II (ICD-250.00)  His updated medication list for this problem includes:    Amlodipine Besy-benazepril Hcl 5-20 Mg Caps (Amlodipine besy-benazepril hcl) .Marland Kitchen... 1 by mouth once daily    Ecotrin Low Strength 81 Mg Tbec (Aspirin) .Marland Kitchen... 1 by mouth qd  Labs Reviewed: Creat: 0.9 (10/04/2008)    Reviewed HgBA1c results: 6.4 (10/04/2008)  6.4 (04/02/2008) stable overall by hx and exam, ok to continue meds/tx as is  - Pt to cont DM diet, excercise, wt loss efforts; to check labs today   Orders: TLB-BMP (Basic Metabolic Panel-BMET) (80048-METABOL) TLB-A1C / Hgb A1C (Glycohemoglobin) (83036-A1C) TLB-Lipid Panel (80061-LIPID)  Problem # 2:  HYPERTENSION (ICD-401.9)  His updated medication list for this problem includes:    Amlodipine Besy-benazepril Hcl 5-20 Mg Caps (Amlodipine besy-benazepril hcl) .Marland Kitchen... 1 by mouth once daily  BP today: 132/82 Prior BP: 122/64 (10/04/2008)  Labs Reviewed: K+: 4.6 (10/04/2008) Creat: : 0.9 (10/04/2008)   Chol: 128 (10/04/2008)   HDL: 48.40 (10/04/2008)    LDL: 74 (10/04/2008)   TG: 27.0 (10/04/2008) stable overall by hx and exam, ok to continue meds/tx as is    Problem # 3:  HYPERLIPIDEMIA (ICD-272.4)  His updated medication list for this problem includes:    Lipitor 10 Mg Tabs (Atorvastatin calcium) .Marland Kitchen... 1 by mouth once daily  Labs Reviewed: SGOT: 21 (10/04/2008)   SGPT: 24 (10/04/2008)   HDL:48.40 (10/04/2008), 54.3 (04/02/2008)  LDL:74 (10/04/2008), 71 (04/02/2008)  Chol:128 (10/04/2008), 132 (04/02/2008)  Trig:27.0 (10/04/2008), 33 (04/02/2008) stable overall by hx and exam, ok to continue meds/tx as is - Pt to continue diet efforts, good med tolerance; to check labs - goal LDL less  than 70   Problem # 4:  ASTHMA (ICD-493.90)  His updated medication list for this problem includes:    Proair Hfa 108 (90 Base) Mcg/act Aers (Albuterol sulfate) .Marland Kitchen... 2 puffs qid as needed    Symbicort 160-4.5 Mcg/act Aero (Budesonide-formoterol fumarate) .Marland Kitchen... 2 puffs two times a day stable overall by hx and exam, ok to continue meds/tx as is   Complete Medication List: 1)  Indomethacin 50 Mg Caps (Indomethacin) .Marland Kitchen.. 1 by mouth three times a day prn 2)  Lipitor 10 Mg Tabs (Atorvastatin calcium) .Marland Kitchen.. 1 by mouth once daily 3)  Amlodipine Besy-benazepril Hcl 5-20 Mg Caps (Amlodipine besy-benazepril hcl) .Marland Kitchen.. 1 by mouth once daily 4)  Allopurinol 300 Mg Tabs (Allopurinol) .Marland Kitchen.. 1 by mouth once daily 5)  Ecotrin Low Strength 81 Mg Tbec (Aspirin) .Marland Kitchen.. 1 by mouth qd 6)  Levitra 20 Mg Tabs (Vardenafil hcl) .Marland Kitchen.. 1 by mouth every other day as needed 7)  Proair Hfa 108 (90 Base) Mcg/act Aers (Albuterol sulfate) .... 2 puffs qid as needed 8)  Symbicort 160-4.5 Mcg/act Aero (Budesonide-formoterol fumarate) .... 2 puffs two times a day 9)  Uroxatral 10 Mg Xr24h-tab (Alfuzosin hcl) .Marland Kitchen.. 1 by mouth daily with a meal 10)  Fish Oil 500 Mg Caps (Omega-3 fatty acids) .Marland Kitchen.. 1 by mouth once daily 11)  Cetirizine Hcl 10 Mg Tabs (Cetirizine hcl) .Marland Kitchen.. 1 by mouth once daily as  needed allergies  Patient Instructions: 1)  Continue all previous medications as before this visit  2)  Please go to the Lab in the basement for your blood and/or urine tests today 3)  Please schedule a follow-up appointment in 6 months.

## 2010-04-24 NOTE — Progress Notes (Signed)
Summary: Prior authorization for  viagra  Phone Note From Pharmacy   Caller: [phone]/ id  [id number] Call For: Dr Jonny Ruiz  Summary of Call: pt insurance  is requiring a PA  for viagra 100 mg tab , must try  levitra first ,  PA requirred for the first  time users of viagra or cialis.   Should I proceed with PA or will you change medication  Initial call taken by: Shelbie Proctor,  October 08, 2008 8:45 AM  Follow-up for Phone Call        ok to change to levitra 20 mg by mouth every other day as needed - #5 with 11 ref Follow-up by: Corwin Levins MD,  October 08, 2008 1:17 PM  Additional Follow-up for Phone Call Additional follow up Details #1::        done new rx and sent to pharm tried to call pt and make aware for you no answer phone just rang will try again later   Just rec emr notification that the pharm on pt chart is no longer valid need another pharm for pt  Additional Follow-up by: Windell Norfolk,  October 08, 2008 1:21 PM    Additional Follow-up for Phone Call Additional follow up Details #2::    i called pt to inform at [phone] that meds were changed no answer  .Marland KitchenMarland KitchenJuly 19, 2010 2:28 PM pt called back informed pt that dr Jonny Ruiz nurse sent in new rx  , insurance would not pay for medication  viagra , dr Jonny Ruiz  prescribe new med ,  Follow-up by: Shelbie Proctor,  October 08, 2008 2:14 PM  New/Updated Medications: LEVITRA 20 MG TABS (VARDENAFIL HCL) 1 by mouth every other day as needed Prescriptions: LEVITRA 20 MG TABS (VARDENAFIL HCL) 1 by mouth every other day as needed  #5 x 11   Entered by:   Windell Norfolk   Authorized by:   Corwin Levins MD   Signed by:   Windell Norfolk on 10/08/2008   Method used:   Electronically to        [facility]. [phone]* (retail)       [address] [place], [place]  [address]       Ph: [phone]       Fax: [phone]   RxID:   [id number]

## 2010-04-24 NOTE — Consult Note (Signed)
Summary: [facility]  [facility]   Imported By: Sherian Rein 01/24/2010 10:46:50  _____________________________________________________________________  External Attachment:    Type:   Image     Comment:   External Document

## 2010-04-24 NOTE — Progress Notes (Signed)
Summary: PROMETHAZINE  Phone Note Refill Request Message from:  Fax from Pharmacy on November 17, 2007 8:21 AM  Refills Requested: Medication #1:  PROMETHAZINE-CODEINE 6.25-10 MG/5ML  SYRP Take 1 teaspoonful four times a day as needed [employer]. [phone]* (retail) [address] [place], [place]  [address] Ph: [phone] Fax: [phone]   Method Requested: Fax to Local Pharmacy Initial call taken by: Shelbie Proctor,  November 17, 2007 8:21 AM  Follow-up for Phone Call        should not need long term narcotic cough med - I would try the Delsym OTC at this point Follow-up by: Corwin Levins MD,  November 17, 2007 9:11 AM  Additional Follow-up for Phone Call Additional follow up Details #1::        November 17, 2007 9:29 AM  called to inform spoke with the pharmasist, left msg on pt cell phone to inform Additional Follow-up by: Shelbie Proctor,  November 17, 2007 9:31 AM

## 2010-04-24 NOTE — Procedures (Signed)
Summary: Colonoscopy   Colonoscopy  Procedure date:  05/16/2008  Findings:      Location:  [facility].    COLONOSCOPY PROCEDURE REPORT  PATIENT:  Allen Hunter, Allen Hunter  MR#:  [id number] BIRTHDATE:   08/08/1940   GENDER:   male  ENDOSCOPIST:   Barbette Hair. Arlyce Dice, MD Referred by: Oliver Barre, M.D.  PROCEDURE DATE:  05/16/2008 PROCEDURE:  Colon with cold biopsy polypectomy ASA CLASS:   Class II INDICATIONS: history of pre-cancerous (adenomatous) colon polyps   MEDICATIONS:    Fentanyl 50 mcg, Versed 6 mg  DESCRIPTION OF PROCEDURE:   After the risks benefits and alternatives of the procedure were thoroughly explained, informed consent was obtained.  Digital rectal exam was performed and revealed no abnormalities.   The [id number] endoscope was introduced through the anus and advanced to the cecum, which was identified by both the appendix and ileocecal valve, without limitations.  The quality of the prep was good, using MiraLax.  The instrument was then slowly withdrawn as the colon was fully examined. <<PROCEDUREIMAGES>>                    <<OLD IMAGES>>  FINDINGS:  Three polyps were found in the rectum. 3 1-8mm polyps just inside anal canal The polyps were removed using cold biopsy forceps (see image13).  This was otherwise a normal examination (see image2, image3, image4, image5, image6, image7, image9, image10, image11, and image12).   Retroflexed views in the rectum revealed no abnormalities.    The scope was then withdrawn from the patient and the procedure completed.  COMPLICATIONS:   None  ENDOSCOPIC IMPRESSION:  1) Three polyps in the rectum  2) Otherwise normal examination RECOMMENDATIONS:  1) If the polyp(s) removed today are proven to be adenomatous (pre-cancerous) polyps, you will need a repeat colonoscopy in 5 years. Otherwise you should continue to follow colorectal cancer screening guidelines for "routine risk" patients with colonoscopy in 10  years.  REPEAT EXAM:   In 5 year(s) for Colonoscopy.  if adenomas   _______________________________ Barbette Hair. Arlyce Dice, MD  CC: Oliver Barre, MD     REPORT OF SURGICAL PATHOLOGY   Case #: [id number] Patient Name: Allen Hunter, Allen Hunter Office Chart Number:  [id number]   MRN: [id number] Pathologist: Ferd Hibbs. Colonel Bald, MD DOB/Age  [birth year] (Age: 70)    Gender: M Date Taken:  05/16/2008 Date Received: 05/16/2008   FINAL DIAGNOSIS   ***MICROSCOPIC EXAMINATION AND DIAGNOSIS***   RECTUM, POLYPS (3):  - HYPERPLASTIC POLYPS (3). - THERE IS NO EVIDENCE OF MALIGNANCY.    mj Date Reported:  05/17/2008     Ferd Hibbs. Colonel Bald, MD   [address] [place], [place]  [address]    Dear [name],  I am pleased to inform you that the colon polyp(s) removed during your recent colonoscopy was (were) found to be hyperplastic.  These types of polyps are NOT pre-cancerous.  It is therefore my recommendation that you have a repeat colonoscopy examination in 10_ years for routine colorectal cancer screening.  Should you develop new or worsening symptoms of abdominal pain, bowel habit changes or bleeding from the rectum or bowels, please schedule an evaluation with either your primary care physician or with me.  Additional information/recommendations:  __No further action with gastroenterology is needed at this time.      Please follow-up with your primary care physician for your other      healthcare needs. __Please call [phone] to  schedule a return visit to review      your situation.  __Please keep your follow-up visit as already scheduled.  __Continue treatment plan as outlined the day of your exam.  Please call us if you are having persistent problems or have questions about your condition that have not been fully answered at this time.  Sincerely,  Louis Meckel MD This letter has been electronically signed by your physician.   Signed by Louis Meckel MD on 05/18/2008  at 11:23  This report was created from the original endoscopy report, which was reviewed and signed by the above listed endoscopist.

## 2010-04-24 NOTE — Assessment & Plan Note (Signed)
Summary: 6 MO ROV/NWS  Medications Added INDOMETHACIN 50 MG  CAPS (INDOMETHACIN) 1 by mouth three times a day prn LIPITOR 10 MG  TABS (ATORVASTATIN CALCIUM) 1 by mouth qd AMLODIPINE BESY-BENAZEPRIL HCL 5-20 MG  CAPS (AMLODIPINE BESY-BENAZEPRIL HCL) 1 by mouth qd ALLOPURINOL 300 MG  TABS (ALLOPURINOL) 1 by mouth qd ECOTRIN LOW STRENGTH 81 MG  TBEC (ASPIRIN) 1 by mouth qd CLARITHROMYCIN 500 MG  TABS (CLARITHROMYCIN) 1 by mouth bid PROMETHAZINE-CODEINE 6.25-10 MG/5ML  SYRP (PROMETHAZINE-CODEINE) 1 tsp by mouth qid prn PREDNISONE 10 MG  TABS (PREDNISONE) 1 by mouth once daily CIALIS 20 MG  TABS (TADALAFIL) 1 by mouth once daily prn      Allergies Added: NKDA  Preventive Care Screening  Colonoscopy:    Date:  05/26/2004    Next Due:  05/2008    Results:  Adenomatous Polyp    Vital Signs:  Patient Profile:   70 Years Old Male Weight:      185 pounds Temp:     97.7 degrees F oral Pulse rate:   71 / minute BP sitting:   115 / 67  Vitals Entered By: Maris Berger (March 09, 2007 8:17 AM)                 Chief Complaint:  f/u.  History of Present Illness: overall lost 15 lbs recently intentionally with improved diet, lower calorie; here with acute prod cough with mild wheezing but no sob, or cp - denies polys or low sugars, compliant with meds  Current Allergies (reviewed today): No known allergies  Updated/Current Medications (including changes made in today's visit):  INDOMETHACIN 50 MG  CAPS (INDOMETHACIN) 1 by mouth three times a day prn LIPITOR 10 MG  TABS (ATORVASTATIN CALCIUM) 1 by mouth qd AMLODIPINE BESY-BENAZEPRIL HCL 5-20 MG  CAPS (AMLODIPINE BESY-BENAZEPRIL HCL) 1 by mouth qd ALLOPURINOL 300 MG  TABS (ALLOPURINOL) 1 by mouth qd ECOTRIN LOW STRENGTH 81 MG  TBEC (ASPIRIN) 1 by mouth qd CLARITHROMYCIN 500 MG  TABS (CLARITHROMYCIN) 1 by mouth bid PROMETHAZINE-CODEINE 6.25-10 MG/5ML  SYRP (PROMETHAZINE-CODEINE) 1 tsp by mouth qid prn PREDNISONE 10 MG   TABS (PREDNISONE) 1 by mouth once daily CIALIS 20 MG  TABS (TADALAFIL) 1 by mouth once daily prn   Past Medical History:    Reviewed history from 10/19/2006 and no changes required:       Gout       Hyperlipidemia       Hypertension       Colonic polyps, hx of       Benign prostatic hypertrophy       E.D.       Diabetes mellitus, type II - diet  Past Surgical History:    Reviewed history and no changes required:       Appendectomy   Family History:    Reviewed history and no changes required:       mother with HTN       uncle with cancer  Social History:    Reviewed history and no changes required:       Former Smoker       Alcohol use-no   Risk Factors:  Tobacco use:  quit Alcohol use:  no  Colonoscopy History:     Date of Last Colonoscopy:  05/26/2004    Results:  Adenomatous Polyp     Physical Exam  General:     mild ill Head:     Normocephalic and atraumatic without obvious abnormalities. No  apparent alopecia or balding. Eyes:     No corneal or conjunctival inflammation noted. EOMI. Perrla.  Ears:     bilat tm's red Nose:     nasal dischargemucosal pallor.   Mouth:     pharyngeal erythema.   Neck:     cervical lymphadenopathy.   Lungs:     R decreased breath sounds, R wheezes, L decreased breath sounds, and L wheezes.   Heart:     Normal rate and regular rhythm. S1 and S2 normal without gallop, murmur, click, rub or other extra sounds. Extremities:     no edema    Impression & Recommendations:  Problem # 1:  BRONCHITIS-ACUTE (ICD-466.0) mild asthmatic - tx with prednisone low dose, and as below  His updated medication list for this problem includes:    Clarithromycin 500 Mg Tabs (Clarithromycin) .Marland Kitchen... 1 by mouth bid    Promethazine-codeine 6.25-10 Mg/67ml Syrp (Promethazine-codeine) .Marland Kitchen... 1 tsp by mouth qid prn   Problem # 2:  HYPERTENSION (ICD-401.9)  His updated medication list for this problem includes:    Amlodipine Besy-benazepril  Hcl 5-20 Mg Caps (Amlodipine besy-benazepril hcl) .Marland Kitchen... 1 by mouth qd  stable, cont meds as is  Problem # 3:  HYPERLIPIDEMIA (ICD-272.4)  His updated medication list for this problem includes:    Lipitor 10 Mg Tabs (Atorvastatin calcium) .Marland Kitchen... 1 by mouth qd  check f/u labs  Problem # 4:  DIABETES MELLITUS, TYPE II (ICD-250.00)  His updated medication list for this problem includes:    Amlodipine Besy-benazepril Hcl 5-20 Mg Caps (Amlodipine besy-benazepril hcl) .Marland Kitchen... 1 by mouth qd    Ecotrin Low Strength 81 Mg Tbec (Aspirin) .Marland Kitchen... 1 by mouth qd  cont diet, check a1c  Orders: TLB-A1C / Hgb A1C (Glycohemoglobin) (83036-A1C) TLB-BMP (Basic Metabolic Panel-BMET) (80048-METABOL) TLB-Lipid Panel (80061-LIPID)   Complete Medication List: 1)  Indomethacin 50 Mg Caps (Indomethacin) .Marland Kitchen.. 1 by mouth three times a day prn 2)  Lipitor 10 Mg Tabs (Atorvastatin calcium) .Marland Kitchen.. 1 by mouth qd 3)  Amlodipine Besy-benazepril Hcl 5-20 Mg Caps (Amlodipine besy-benazepril hcl) .Marland Kitchen.. 1 by mouth qd 4)  Allopurinol 300 Mg Tabs (Allopurinol) .Marland Kitchen.. 1 by mouth qd 5)  Ecotrin Low Strength 81 Mg Tbec (Aspirin) .Marland Kitchen.. 1 by mouth qd 6)  Clarithromycin 500 Mg Tabs (Clarithromycin) .Marland Kitchen.. 1 by mouth bid 7)  Promethazine-codeine 6.25-10 Mg/35ml Syrp (Promethazine-codeine) .Marland Kitchen.. 1 tsp by mouth qid prn 8)  Prednisone 10 Mg Tabs (Prednisone) .Marland Kitchen.. 1 by mouth once daily 9)  Cialis 20 Mg Tabs (Tadalafil) .Marland Kitchen.. 1 by mouth once daily prn  Other Orders: Flu Vaccine 26yrs + (36644) Admin 1st Vaccine (03474) Pneumococcal Vaccine (25956) Admin of Any Addtl Vaccine (38756) TLB-Hepatic/Liver Function Pnl (80076-HEPATIC)   Patient Instructions: 1)  Take antibiotic, cough medicine, and prednisone as prescribed 2)  Continue all other medications as is 3)  You will have flu shot and Pneumonia shot today 4)  You will have blood work today 5)  Start the coated aspirin 81 mg - 1 per day 6)  Please schedule a follow-up appointment in 6  months.    Prescriptions: CIALIS 20 MG  TABS (TADALAFIL) 1 by mouth once daily prn  #10 x 11   Entered and Authorized by:   Corwin Levins MD   Signed by:   Corwin Levins MD on 03/09/2007   Method used:   Print then Give to Patient   RxID:   [id number] PREDNISONE 10 MG  TABS (PREDNISONE) 1 by mouth  once daily  #7 x 0   Entered and Authorized by:   Corwin Levins MD   Signed by:   Corwin Levins MD on 03/09/2007   Method used:   Print then Give to Patient   RxID:   [id number] PROMETHAZINE-CODEINE 6.25-10 MG/5ML  SYRP (PROMETHAZINE-CODEINE) 1 tsp by mouth qid prn  #8 oz x 1   Entered and Authorized by:   Corwin Levins MD   Signed by:   Corwin Levins MD on 03/09/2007   Method used:   Print then Give to Patient   RxID:   [id number] CLARITHROMYCIN 500 MG  TABS (CLARITHROMYCIN) 1 by mouth bid  #20 x 0   Entered and Authorized by:   Corwin Levins MD   Signed by:   Corwin Levins MD on 03/09/2007   Method used:   Print then Give to Patient   RxID:   [id number]  ]  Pneumovax Vaccine    Vaccine Type: Pneumovax    Site: left deltoid    Mfr: Merck    Dose: 0.5 ml    Route: IM    Given by: Maris Berger    Exp. Date: 04/15/2008    Lot #: [id number]  Influenza Vaccine    Vaccine Type: Fluvax 3+    Site: right deltoid    Mfr: Sanofi Pasteur    Dose: 0.5 ml    Route: IM    Given by: Maris Berger    Exp. Date: 09/20/2007    Lot #: [id number]  Flu Vaccine Consent Questions    Do you have a history of severe allergic reactions to this vaccine? no    Any prior history of allergic reactions to egg and/or gelatin? no    Do you have a sensitivity to the preservative Thimersol? no    Do you have a past history of Guillan-Barre Syndrome? no    Do you currently have an acute febrile illness? no    Have you ever had a severe reaction to latex? no    Vaccine information given and explained to patient? yes

## 2010-04-24 NOTE — Progress Notes (Signed)
Summary: refill  Phone Note From Pharmacy Message from:  Fax from Pharmacy on October 18, 2008 10:53 AM  Caller: [facility] [address] [place] [phone] Summary of Call: sent fax stating that the pt has transferred to there pharmacy from [facility]... they were able xfer some of pt meds but not all of them so they are asking for refill on the meds that could not be xferred which are ; Indomethacin, lipitor, amlodipne, allopurinol, viagra, proair hfa..... is this ok to do   Initial call taken by: Windell Norfolk,  October 18, 2008 10:57 AM  Follow-up for Phone Call        yes, all of those should be fine per routine refills Follow-up by: Corwin Levins MD,  October 18, 2008 12:52 PM  Additional Follow-up for Phone Call Additional follow up Details #1::        done Additional Follow-up by: Windell Norfolk,  October 18, 2008 1:19 PM    Prescriptions: PROAIR HFA 108 (90 BASE) MCG/ACT  AERS (ALBUTEROL SULFATE) 2 puffs qid as needed  #1 x 5   Entered by:   Windell Norfolk   Authorized by:   Corwin Levins MD   Signed by:   Windell Norfolk on 10/18/2008   Method used:   Electronically to        [facility] [phone]* (retail)       [address]       [place], [place]  [address]       Ph: [phone] or [phone]       Fax: [phone]   RxID:   [id number] ALLOPURINOL 300 MG  TABS (ALLOPURINOL) 1 by mouth once daily  #30 x 5   Entered by:   Windell Norfolk   Authorized by:   Corwin Levins MD   Signed by:   Windell Norfolk on 10/18/2008   Method used:   Electronically to        [facility] [phone]* (retail)       [address]       [place], [place]  [address]       Ph: [phone] or [phone]       Fax: [phone]   RxID:   [id number] AMLODIPINE BESY-BENAZEPRIL HCL 5-20 MG  CAPS (AMLODIPINE BESY-BENAZEPRIL HCL) 1 by mouth once daily  #30 x 5   Entered by:   Windell Norfolk   Authorized by:   Corwin Levins MD   Signed by:   Windell Norfolk on 10/18/2008   Method  used:   Electronically to        [facility] [phone]* (retail)       [address] [place], [place]  [address]       Ph: [phone] or [phone]       Fax: [phone]   RxID:   [id number] LIPITOR 10 MG  TABS (ATORVASTATIN CALCIUM) 1 by mouth once daily  #30 x 5   Entered by:   Windell Norfolk   Authorized by:   Corwin Levins MD   Signed by:   Windell Norfolk on 10/18/2008   Method used:   Electronically to        [facility] [phone]* (retail)  [address]       [place], [place]  [address]       Ph: [phone] or [phone]       Fax: [phone]   RxID:   [id number] INDOMETHACIN 50 MG  CAPS (INDOMETHACIN) 1 by mouth three times a day prn  #90 x 5   Entered by:   Windell Norfolk   Authorized by:   Corwin Levins MD   Signed by:   Windell Norfolk on 10/18/2008   Method used:   Electronically to        [facility] [phone]* (retail)       [address]       [place], [place]  [address]       Ph: [phone] or [phone]       Fax: [phone]   RxID:   [id number]   Appended Document: refill  rec another fax from pharm stating they need the rx for the levitra as well   Clinical Lists Changes  Medications: Rx of LEVITRA 20 MG TABS (VARDENAFIL HCL) 1 by mouth every other day as needed;  #5 x 5;  Signed;  Entered by: Windell Norfolk;  Authorized by: Corwin Levins MD;  Method used: [facility] to [facility] [phone]*, [address], [place], [place], [place]  [address], Ph: [phone] or [phone], Fax: [phone]    Prescriptions: LEVITRA 20 MG TABS (VARDENAFIL HCL) 1 by mouth every other day as needed  #5 x 5   Entered by:   Windell Norfolk   Authorized by:   Corwin Levins MD   Signed by:   Windell Norfolk on 10/19/2008   Method used:   Electronically to        [facility] [phone]* (retail)       [address]       [place], [place]  [address]       Ph:  [phone] or [phone]       Fax: [phone]   RxID:   [id number]

## 2010-04-24 NOTE — Assessment & Plan Note (Signed)
Summary: bump back of head/cd   Vital Signs:  Patient profile:   70 year old male Height:      65 inches Weight:      196.50 pounds BMI:     32.82 O2 Sat:      96 % on Room air Temp:     98.5 degrees F oral Pulse rate:   66 / minute BP sitting:   120 / 62  (left arm) Cuff size:   regular  Vitals Entered By: Zella Ball Ewing CMA Duncan Dull) (January 09, 2010 9:59 AM)  O2 Flow:  Room air CC: Bump on back of head for 2 months/RE   CC:  Bump on back of head for 2 months/RE.  History of Present Illness: here today with enlarging area to the right occipital area for the past month;  didnt really bother the pt until he was at the barber who suggested he get it looked at ;  pt has one swollen area that is slightly enlarging that he has had for many yrs, but also a much larger area very near it still in the occipital area that is new and even larger in area (still doesnt bother him);  no pain, drainage, and No fever, wt loss, night sweats, loss of appetite or other constitutional symptoms  No recent trauma.  Pt denies CP, worsening sob, doe, wheezing, orthopnea, pnd, worsening LE edema, palps, dizziness or syncope  Pt denies new neuro symptoms such as headache, facial or extremity weakness  Pt denies polydipsia, polyuria.    Problems Prior to Update: 1)  Preventive Health Care  (ICD-V70.0) 2)  Asthma  (ICD-493.90) 3)  Allergic Rhinitis  (ICD-477.9) 4)  Fatigue  (ICD-780.79) 5)  Back Pain  (ICD-724.5) 6)  COPD  (ICD-496) 7)  Special Screening Malig Neoplasms Other Sites  (ICD-V76.49) 8)  Fatigue  (ICD-780.79) 9)  Hepatotoxicity, Drug-induced, Risk of  (ICD-V58.69) 10)  Diabetes Mellitus, Type II  (ICD-250.00) 11)  Benign Prostatic Hypertrophy  (ICD-600.00) 12)  Colonic Polyps, Hx of  (ICD-V12.72) 13)  Hypertension  (ICD-401.9) 14)  Hyperlipidemia  (ICD-272.4) 15)  Gout  (ICD-274.9)  Medications Prior to Update: 1)  Indomethacin 50 Mg  Caps (Indomethacin) .Marland Kitchen.. 1 By Mouth Three Times A Day  Prn 2)  Lipitor 10 Mg  Tabs (Atorvastatin Calcium) .Marland Kitchen.. 1 By Mouth Once Daily 3)  Amlodipine Besy-Benazepril Hcl 5-20 Mg  Caps (Amlodipine Besy-Benazepril Hcl) .Marland Kitchen.. 1 By Mouth Once Daily 4)  Allopurinol 300 Mg  Tabs (Allopurinol) .Marland Kitchen.. 1 By Mouth Once Daily 5)  Ecotrin Low Strength 81 Mg  Tbec (Aspirin) .Marland Kitchen.. 1 By Mouth Qd 6)  Levitra 20 Mg Tabs (Vardenafil Hcl) .Marland Kitchen.. 1 By Mouth Every Other Day As Needed 7)  Proair Hfa 108 (90 Base) Mcg/act  Aers (Albuterol Sulfate) .... 2 Puffs Qid As Needed 8)  Symbicort 160-4.5 Mcg/act Aero (Budesonide-Formoterol Fumarate) .... 2 Puffs Two Times A Day 9)  Uroxatral 10 Mg Xr24h-Tab (Alfuzosin Hcl) .Marland Kitchen.. 1 By Mouth Daily With A Meal 10)  Fish Oil 500 Mg Caps (Omega-3 Fatty Acids) .Marland Kitchen.. 1 By Mouth Once Daily 11)  Cetirizine Hcl 10 Mg Tabs (Cetirizine Hcl) .Marland Kitchen.. 1 By Mouth Once Daily As Needed Allergies  Current Medications (verified): 1)  Indomethacin 50 Mg  Caps (Indomethacin) .Marland Kitchen.. 1 By Mouth Three Times A Day Prn 2)  Lipitor 10 Mg  Tabs (Atorvastatin Calcium) .Marland Kitchen.. 1 By Mouth Once Daily 3)  Amlodipine Besy-Benazepril Hcl 5-20 Mg  Caps (Amlodipine Besy-Benazepril Hcl) .Marland Kitchen.. 1 By  Mouth Once Daily 4)  Allopurinol 300 Mg  Tabs (Allopurinol) .Marland Kitchen.. 1 By Mouth Once Daily 5)  Ecotrin Low Strength 81 Mg  Tbec (Aspirin) .Marland Kitchen.. 1 By Mouth Qd 6)  Levitra 20 Mg Tabs (Vardenafil Hcl) .Marland Kitchen.. 1 By Mouth Every Other Day As Needed 7)  Proair Hfa 108 (90 Base) Mcg/act  Aers (Albuterol Sulfate) .... 2 Puffs Qid As Needed 8)  Symbicort 160-4.5 Mcg/act Aero (Budesonide-Formoterol Fumarate) .... 2 Puffs Two Times A Day 9)  Uroxatral 10 Mg Xr24h-Tab (Alfuzosin Hcl) .Marland Kitchen.. 1 By Mouth Daily With A Meal 10)  Fish Oil 500 Mg Caps (Omega-3 Fatty Acids) .Marland Kitchen.. 1 By Mouth Once Daily 11)  Cetirizine Hcl 10 Mg Tabs (Cetirizine Hcl) .Marland Kitchen.. 1 By Mouth Once Daily As Needed Allergies  Allergies (verified): No Known Drug Allergies  Past History:  Past Medical History: Last updated:  10/08/2009 Gout Hyperlipidemia Hypertension Colonic polyps, hx of Benign prostatic hypertrophy E.D. Diabetes mellitus, type II - diet COPD Allergic rhinitis Asthma  MD roster:   urology/Dr Evelene Croon - [place]                    GI/Dr Arlyce Dice                    derm/  physician                     eye/optometry - Ginette Otto  Past Surgical History: Last updated: 03/09/2007 Appendectomy  Social History: Last updated: 10/08/2009 Former Smoker Alcohol use-no Single no children reitred - [place] [employer]  - aug 2009 Drug use-no  Risk Factors: Smoking Status: quit (03/09/2007)  Review of Systems       all otherwise negative per pt -    Physical Exam  General:  alert.   Head:  normocephalic and atraumatic.   Eyes:  vision grossly intact, pupils equal, and pupils round.   Ears:  R ear normal and L ear normal.   Nose:  no external deformity and no nasal discharge.   Mouth:  no gingival abnormalities and pharynx pink and moist.   Neck:  supple and no masses.   Lungs:  normal respiratory effort and normal breath sounds.   Heart:  normal rate and regular rhythm.   Extremities:  no edema, no erythema  Skin:  right occipital area with 2 probable nonmobiile cystic masses subq nontender , nondraining, 1 of which is smaller approx 1 cm diamate and 1/2 cm raised;  the other (? related) only 1/5 cm removed from the first but approx 3 cm in diameter/ 1/2 cm raised to area more lateral in the right occipiatal area and post to the right ear   Impression & Recommendations:  Problem # 1:  SWELLING MASS OR LUMP IN HEAD AND NECK (ICD-784.2) cystic mass x 2, enlarging, prob benign but are changing, doubt abscess or malignancy, but will need referral to ENT for definitive management Orders: ENT Referral (ENT)  Complete Medication List: 1)  Indomethacin 50 Mg Caps (Indomethacin) .Marland Kitchen.. 1 by mouth three times a day prn 2)  Lipitor 10 Mg Tabs (Atorvastatin calcium) .Marland Kitchen.. 1 by mouth  once daily 3)  Amlodipine Besy-benazepril Hcl 5-20 Mg Caps (Amlodipine besy-benazepril hcl) .Marland Kitchen.. 1 by mouth once daily 4)  Allopurinol 300 Mg Tabs (Allopurinol) .Marland Kitchen.. 1 by mouth once daily 5)  Ecotrin Low Strength 81 Mg Tbec (Aspirin) .Marland Kitchen.. 1 by mouth qd 6)  Levitra 20 Mg Tabs (Vardenafil hcl) .Marland Kitchen.. 1 by  mouth every other day as needed 7)  Proair Hfa 108 (90 Base) Mcg/act Aers (Albuterol sulfate) .... 2 puffs qid as needed 8)  Symbicort 160-4.5 Mcg/act Aero (Budesonide-formoterol fumarate) .... 2 puffs two times a day 9)  Uroxatral 10 Mg Xr24h-tab (Alfuzosin hcl) .Marland Kitchen.. 1 by mouth daily with a meal 10)  Fish Oil 500 Mg Caps (Omega-3 fatty acids) .Marland Kitchen.. 1 by mouth once daily 11)  Cetirizine Hcl 10 Mg Tabs (Cetirizine hcl) .Marland Kitchen.. 1 by mouth once daily as needed allergies  Patient Instructions: 1)  You will be contacted about the referral(s) to: ENT 2)  Continue all previous medications as before this visit 3)  Please schedule a follow-up appointment in 3 months. Prescriptions: LEVITRA 20 MG TABS (VARDENAFIL HCL) 1 by mouth every other day as needed  #5 x 11   Entered and Authorized by:   Corwin Levins MD   Signed by:   Corwin Levins MD on 01/09/2010   Method used:   Print then Give to Patient   RxID:   [id number]    Orders Added: 1)  ENT Referral [ENT] 2)  Est. Patient Level III [08657]

## 2010-04-24 NOTE — Progress Notes (Signed)
  Phone Note Refill Request Message from:  Fax from Pharmacy on February 21, 2010 8:11 AM  Refills Requested: Medication #1:  AMLODIPINE BESY-BENAZEPRIL HCL 5-20 MG  CAPS 1 by mouth once daily   Dosage confirmed as above?Dosage Confirmed   Last Refilled: 05/2009   Notes: [facility]  Medication #2:  ALLOPURINOL 300 MG  TABS 1 by mouth once daily   Dosage confirmed as above?Dosage Confirmed   Last Refilled: 05/2009   Notes: [facility] Initial call taken by: Robin Ewing CMA (AAMA),  February 21, 2010 8:12 AM    Prescriptions: ALLOPURINOL 300 MG  TABS (ALLOPURINOL) 1 by mouth once daily  #90 x 3   Entered by:   Zella Ball Ewing CMA (AAMA)   Authorized by:   Corwin Levins MD   Signed by:   Scharlene Gloss CMA (AAMA) on 02/21/2010   Method used:   Faxed to ...       [facility] (mail-order)             , [place]         Ph: [phone]       Fax: [phone]   RxID:   [id number] AMLODIPINE BESY-BENAZEPRIL HCL 5-20 MG  CAPS (AMLODIPINE BESY-BENAZEPRIL HCL) 1 by mouth once daily  #90 x 3   Entered by:   Scharlene Gloss CMA (AAMA)   Authorized by:   Corwin Levins MD   Signed by:   Scharlene Gloss CMA (AAMA) on 02/21/2010   Method used:   Faxed to ...       [facility] [employer])             , [place]         Ph: [phone]       Fax: [phone]   RxID:   [id number]

## 2010-04-24 NOTE — Miscellaneous (Signed)
Summary: [facility] PREVISIT/PREP  Clinical Lists Changes  Medications: Added new medication of DULCOLAX 5 MG  TBEC (BISACODYL) Day before procedure take 2 at 3pm and 2 at 8pm. - Signed Added new medication of METOCLOPRAMIDE HCL 10 MG  TABS (METOCLOPRAMIDE HCL) As per prep instructions. - Signed Added new medication of MIRALAX   POWD (POLYETHYLENE GLYCOL 3350) As per prep  instructions. - Signed Rx of DULCOLAX 5 MG  TBEC (BISACODYL) Day before procedure take 2 at 3pm and 2 at 8pm.;  #4 x 0;  Signed;  Entered by: Jennye Boroughs RN;  Authorized by: Louis Meckel MD;  Method used: Electronically to [facility]. [phone]*, [address] [place], [place], [place]  [address], Ph: [phone], Fax: [phone] Rx of METOCLOPRAMIDE HCL 10 MG  TABS (METOCLOPRAMIDE HCL) As per prep instructions.;  #2 x 0;  Signed;  Entered by: Jennye Boroughs RN;  Authorized by: Louis Meckel MD;  Method used: Electronically to [facility]. [phone]*, [address], [place], [place], [place]  [address], Ph: [phone], Fax: [phone] Rx of MIRALAX   POWD (POLYETHYLENE GLYCOL 3350) As per prep  instructions.;  #255gm x 0;  Signed;  Entered by: Jennye Boroughs RN;  Authorized by: Louis Meckel MD;  Method used: Electronically to [facility]. [phone]*, [address], [place], [place], [place]  [address], Ph: [phone], Fax: [phone] Observations: Added new observation of NKA: T (05/07/2008 13:51)    Prescriptions: MIRALAX   POWD (POLYETHYLENE GLYCOL 3350) As per prep  instructions.  #255gm x 0   Entered by:   Jennye Boroughs RN   Authorized by:   Louis Meckel MD   Signed by:   Jennye Boroughs RN on 05/07/2008   Method used:   Electronically to        [facility]. [phone]* (retail)       [address] [place], [place]  [address]       Ph: [phone]       Fax: [phone]   RxID:   [id number] METOCLOPRAMIDE HCL 10  MG  TABS (METOCLOPRAMIDE HCL) As per prep instructions.  #2 x 0   Entered by:   Jennye Boroughs RN   Authorized by:   Louis Meckel MD   Signed by:   Jennye Boroughs RN on 05/07/2008   Method used:   Electronically to        [facility]. [phone]* (retail)       [address] [place], [place]  [address]       Ph: [phone]       Fax: [phone]   RxID:   [id number] DULCOLAX 5 MG  TBEC (BISACODYL) Day before procedure take 2 at 3pm and 2 at 8pm.  #4 x 0   Entered by:   Jennye Boroughs RN   Authorized by:   Louis Meckel MD   Signed by:   Jennye Boroughs RN on 05/07/2008   Method used:   Electronically to        [facility]. [phone]* (retail)       [address] [place]  [place], [place]  [address]       Ph: [phone]       Fax: [phone]   RxID:   [id number]

## 2010-04-24 NOTE — Assessment & Plan Note (Signed)
Summary: back pain---$50---stc   Vital Signs:  Patient Profile:   70 Years Old Male Weight:      198.50 pounds Temp:     97.7 degrees F oral Pulse rate:   79 / minute BP sitting:   152 / 80  (right arm) Cuff size:   regular  Vitals Entered By: Windell Norfolk (April 18, 2008 11:19 AM)                  Chief Complaint:  Back Pain.  History of Present Illness: unfortunately gained 13 lbs since july 2009 - BP usually well controlled, not checked at home recetnly; not really watching the diet; has been active at the gy every other day but cant run b/c of pain; here with pain to the mid upper back bilat with tender spots near the shoulder blades, worse to move the arms and shoulders, no neuro symptoms such as LE weakness or numbness; has been trying to do more excercises lately; denies polys or low sugars, good compliance with meds, may consider more cardio with his excercises soon    Prior Medication List:  INDOMETHACIN 50 MG  CAPS (INDOMETHACIN) 1 by mouth three times a day prn LIPITOR 10 MG  TABS (ATORVASTATIN CALCIUM) 1 by mouth once daily AMLODIPINE BESY-BENAZEPRIL HCL 5-20 MG  CAPS (AMLODIPINE BESY-BENAZEPRIL HCL) 1 by mouth once daily ALLOPURINOL 300 MG  TABS (ALLOPURINOL) 1 by mouth once daily ECOTRIN LOW STRENGTH 81 MG  TBEC (ASPIRIN) 1 by mouth qd VIAGRA 100 MG  TABS (SILDENAFIL CITRATE) 1 by mouth once daily as needed PROAIR HFA 108 (90 BASE) MCG/ACT  AERS (ALBUTEROL SULFATE) 2 puffs qid as needed ASMANEX 60 METERED DOSES 220 MCG/INH  AEPB (MOMETASONE FUROATE) 1 puff qpm UROXATRAL 10 MG XR24H-TAB (ALFUZOSIN HCL) 1 by mouth daily with a meal   Updated Prior Medication List: INDOMETHACIN 50 MG  CAPS (INDOMETHACIN) 1 by mouth three times a day prn LIPITOR 10 MG  TABS (ATORVASTATIN CALCIUM) 1 by mouth once daily AMLODIPINE BESY-BENAZEPRIL HCL 5-20 MG  CAPS (AMLODIPINE BESY-BENAZEPRIL HCL) 1 by mouth once daily ALLOPURINOL 300 MG  TABS (ALLOPURINOL) 1 by mouth once  daily ECOTRIN LOW STRENGTH 81 MG  TBEC (ASPIRIN) 1 by mouth qd VIAGRA 100 MG  TABS (SILDENAFIL CITRATE) 1 by mouth once daily as needed PROAIR HFA 108 (90 BASE) MCG/ACT  AERS (ALBUTEROL SULFATE) 2 puffs qid as needed ASMANEX 60 METERED DOSES 220 MCG/INH  AEPB (MOMETASONE FUROATE) 1 puff qpm UROXATRAL 10 MG XR24H-TAB (ALFUZOSIN HCL) 1 by mouth daily with a meal  Current Allergies (reviewed today): No known allergies   Past Medical History:    Reviewed history from 10/20/2007 and no changes required:       Gout       Hyperlipidemia       Hypertension       Colonic polyps, hx of       Benign prostatic hypertrophy       E.D.       Diabetes mellitus, type II - diet       COPD  Past Surgical History:    Reviewed history from 03/09/2007 and no changes required:       Appendectomy   Social History:    Reviewed history from 04/02/2008 and no changes required:       Former Smoker       Alcohol use-no       Single       no children  reitred - [place] [employer]     Review of Systems       all otherwise negative    Physical Exam  General:     alert and overweight-appearing.   Head:     Normocephalic and atraumatic without obvious abnormalities. No apparent alopecia or balding. Eyes:     No corneal or conjunctival inflammation noted. EOMI. Perrla. Ears:     External ear exam shows no significant lesions or deformities.  Otoscopic examination reveals clear canals, tympanic membranes are intact bilaterally without bulging, retraction, inflammation or discharge. Hearing is grossly normal bilaterally. Nose:     External nasal examination shows no deformity or inflammation. Nasal mucosa are pink and moist without lesions or exudates. Neck:     supple and full ROM.   Lungs:     Normal respiratory effort, chest expands symmetrically. Lungs are clear to auscultation, no crackles or wheezes. Heart:     Normal rate and regular rhythm. S1 and S2 normal without gallop, murmur,  click, rub or other extra sounds. Msk:     mild tender paravertebral muscle bilat approx t4 level without erythema or visible swelling Extremities:     no edema, no ulcers  Neurologic:     cranial nerves II-XII intact and strength normal in all extremities.      Impression & Recommendations:  Problem # 1:  BACK PAIN (ICD-724.5)  His updated medication list for this problem includes:    Indomethacin 50 Mg Caps (Indomethacin) .Marland Kitchen... 1 by mouth three times a day prn    Ecotrin Low Strength 81 Mg Tbec (Aspirin) .Marland Kitchen... 1 by mouth qd c/w msk strain, ok to follow  Problem # 2:  DIABETES MELLITUS, TYPE II (ICD-250.00)  His updated medication list for this problem includes:    Amlodipine Besy-benazepril Hcl 5-20 Mg Caps (Amlodipine besy-benazepril hcl) .Marland Kitchen... 1 by mouth once daily    Ecotrin Low Strength 81 Mg Tbec (Aspirin) .Marland Kitchen... 1 by mouth qd  Labs Reviewed: HgBA1c: 6.4 (04/02/2008)   Creat: 1.1 (04/02/2008)   Microalbumin: 0.1 (10/05/2007) stable overall by hx and exam, ok to continue meds/tx as is   Problem # 3:  HYPERTENSION (ICD-401.9)  His updated medication list for this problem includes:    Amlodipine Besy-benazepril Hcl 5-20 Mg Caps (Amlodipine besy-benazepril hcl) .Marland Kitchen... 1 by mouth once daily  BP today: 152/80 Prior BP: 138/64 (04/02/2008)  Labs Reviewed: Creat: 1.1 (04/02/2008) Chol: 132 (04/02/2008)   HDL: 54.3 (04/02/2008)   LDL: 71 (04/02/2008)   TG: 33 (04/02/2008) mild elev today likely due to pain - ok to follow, cont to monitor BP at home  Problem # 4:  HYPERLIPIDEMIA (ICD-272.4)  His updated medication list for this problem includes:    Lipitor 10 Mg Tabs (Atorvastatin calcium) .Marland Kitchen... 1 by mouth once daily  Labs Reviewed: Chol: 132 (04/02/2008)   HDL: 54.3 (04/02/2008)   LDL: 71 (04/02/2008)   TG: 33 (04/02/2008) SGOT: 20 (10/05/2007)   SGPT: 25 (10/05/2007) stable overall by hx and exam, ok to continue meds/tx as is    Patient Instructions: 1)  please take  tylenol as needed for pain 2)  Continue all medications that you may have been taking previously  3)  keep trying to lose wt by being more active and limiting the calories 4)  Please schedule a follow-up appointment in 6 months. 5)  Check your Blood Pressure regularly. If it is above 140/90 : you should make an appointment sooner

## 2010-04-24 NOTE — Progress Notes (Signed)
Summary: prescription request  Phone Note From Pharmacy   [facility]. [phone]* Summary of Call: Patient requesting rx for test strips ( One touch ultra) and lancets. Pharmacy said pt. says that he was asked to test. [facility]. Initial call taken by: Zella Ball Ewing CMA Duncan Dull),  January 27, 2010 3:41 PM  Follow-up for Phone Call        to robin to handle Follow-up by: Corwin Levins MD,  January 27, 2010 4:46 PM    New/Updated Medications: ONETOUCH TEST  STRP (GLUCOSE BLOOD) test as directed ONETOUCH LANCETS  MISC (LANCETS) use as directed Prescriptions: ONETOUCH LANCETS  MISC (LANCETS) use as directed  #100 x 3   Entered by:   Scharlene Gloss CMA (AAMA)   Authorized by:   Corwin Levins MD   Signed by:   Scharlene Gloss CMA (AAMA) on 01/28/2010   Method used:   [facility] to        [facility] [phone]* (retail)       [address] [place], [place]  [address]       Ph: [phone] or [phone]       Fax: [phone]   RxID:   [id number] [facility] TEST  STRP (GLUCOSE BLOOD) test as directed  #100 x 3   Entered by:   Scharlene Gloss CMA (AAMA)   Authorized by:   Corwin Levins MD   Signed by:   Scharlene Gloss CMA (AAMA) on 01/28/2010   Method used:   [facility]ically to        [facility] [phone]* (retail)       [address] [place], [place]  [address]       Ph: [phone] or [phone]       Fax: [phone]   RxID:   [id number]

## 2010-04-24 NOTE — Progress Notes (Signed)
Summary: 90 day supply  Phone Note From Pharmacy   [facility] [address]. [phone]* Summary of Call: rec voicemail from pharmacy stating the pt is requesting a 90 day supply of lipitor Initial call taken by: Windell Norfolk,  October 29, 2008 11:17 AM    Prescriptions: LIPITOR 10 MG  TABS (ATORVASTATIN CALCIUM) 1 by mouth once daily  #90 x 1   Entered by:   Windell Norfolk   Authorized by:   Corwin Levins MD   Signed by:   Windell Norfolk on 10/29/2008   Method used:   [facility]ronically to        [facility] [phone]* (retail)       [address] [place], [place]  [address]       Ph: [phone] or [phone]       Fax: [phone]   RxID:   [id number]

## 2010-04-24 NOTE — Assessment & Plan Note (Signed)
Summary: 6 MO ROV /NWS $50   Vital Signs:  Patient profile:   70 year old male Height:      65 inches Weight:      207.25 pounds BMI:     34.61 O2 Sat:      95 % Temp:     97.7 degrees F oral Pulse rate:   78 / minute BP sitting:   122 / 64  (left arm) Cuff size:   regular  Vitals Entered By: Windell Norfolk (October 04, 2008 8:05 AM)  Preventive Care Screening  Colonoscopy:    Date:  05/02/2008    Next Due:  05/2018    Results:  Hyperplastic Polyp   CC: 6 MONTH ROV   CC:  6 MONTH ROV.  History of Present Illness: newly retired aug 31, gained 10 lbs in last 6 mo; has been less active but plans to go more to the gym more tan one or twice per wk, but back pain gets worse, but plans to do other excercise; trying to follow the diet 'for themost part", cbg's in the low 100's, good compliacne with meds, asmaxnex also jsut not working as well for the [employer] the past month; no fever but has increased non prod cough and has felt quite dizzy on several occasions with lightheadedness  but no synceop; no CP, sob, doe, orhtopnea, pnd or LE edema; states 30 min on the treadmill is ok despite the wheezing; does not feel it limiting  Problems Prior to Update: 1)  Asthma  (ICD-493.90) 2)  Allergic Rhinitis  (ICD-477.9) 3)  Fatigue  (ICD-780.79) 4)  Back Pain  (ICD-724.5) 5)  COPD  (ICD-496) 6)  Special Screening Malig Neoplasms Other Sites  (ICD-V76.49) 7)  Fatigue  (ICD-780.79) 8)  Hepatotoxicity, Drug-induced, Risk of  (ICD-V58.69) 9)  Diabetes Mellitus, Type II  (ICD-250.00) 10)  Benign Prostatic Hypertrophy  (ICD-600.00) 11)  Colonic Polyps, Hx of  (ICD-V12.72) 12)  Hypertension  (ICD-401.9) 13)  Hyperlipidemia  (ICD-272.4) 14)  Gout  (ICD-274.9)  Medications Prior to Update: 1)  Indomethacin 50 Mg  Caps (Indomethacin) .Marland Kitchen.. 1 By Mouth Three Times A Day Prn 2)  Lipitor 10 Mg  Tabs (Atorvastatin Calcium) .Marland Kitchen.. 1 By Mouth Once Daily 3)  Amlodipine Besy-Benazepril Hcl 5-20 Mg  Caps  (Amlodipine Besy-Benazepril Hcl) .Marland Kitchen.. 1 By Mouth Once Daily 4)  Allopurinol 300 Mg  Tabs (Allopurinol) .Marland Kitchen.. 1 By Mouth Once Daily 5)  Ecotrin Low Strength 81 Mg  Tbec (Aspirin) .Marland Kitchen.. 1 By Mouth Qd 6)  Viagra 100 Mg  Tabs (Sildenafil Citrate) .Marland Kitchen.. 1 By Mouth Once Daily As Needed 7)  Proair Hfa 108 (90 Base) Mcg/act  Aers (Albuterol Sulfate) .... 2 Puffs Qid As Needed 8)  Asmanex 60 Metered Doses 220 Mcg/inh  Aepb (Mometasone Furoate) .Marland Kitchen.. 1 Puff Qpm 9)  Uroxatral 10 Mg Xr24h-Tab (Alfuzosin Hcl) .Marland Kitchen.. 1 By Mouth Daily With A Meal  Current Medications (verified): 1)  Indomethacin 50 Mg  Caps (Indomethacin) .Marland Kitchen.. 1 By Mouth Three Times A Day Prn 2)  Lipitor 10 Mg  Tabs (Atorvastatin Calcium) .Marland Kitchen.. 1 By Mouth Once Daily 3)  Amlodipine Besy-Benazepril Hcl 5-20 Mg  Caps (Amlodipine Besy-Benazepril Hcl) .Marland Kitchen.. 1 By Mouth Once Daily 4)  Allopurinol 300 Mg  Tabs (Allopurinol) .Marland Kitchen.. 1 By Mouth Once Daily 5)  Ecotrin Low Strength 81 Mg  Tbec (Aspirin) .Marland Kitchen.. 1 By Mouth Qd 6)  Viagra 100 Mg  Tabs (Sildenafil Citrate) .Marland Kitchen.. 1 By Mouth Once Daily As Needed 7)  Proair Hfa 108 (90 Base) Mcg/act  Aers (Albuterol Sulfate) .... 2 Puffs Qid As Needed 8)  Symbicort 160-4.5 Mcg/act Aero (Budesonide-Formoterol Fumarate) .... 2 Puffs Two Times A Day 9)  Uroxatral 10 Mg Xr24h-Tab (Alfuzosin Hcl) .Marland Kitchen.. 1 By Mouth Daily With A Meal 10)  Fish Oil 500 Mg Caps (Omega-3 Fatty Acids) .Marland Kitchen.. 1 By Mouth Once Daily 11)  Cetirizine Hcl 10 Mg Tabs (Cetirizine Hcl) .Marland Kitchen.. 1 By Mouth Once Daily As Needed Allergies  Allergies (verified): No Known Drug Allergies  Past History:  Past Surgical History: Last updated: 03/09/2007 Appendectomy  Family History: Last updated: 03/09/2007 mother with HTN uncle with cancer  Social History: Last updated: 10/04/2008 Former Smoker Alcohol use-no Single no children reitred - [place] [employer]  - aug 2009  Risk Factors: Smoking Status: quit (03/09/2007)  Past Medical  History: Gout Hyperlipidemia Hypertension Colonic polyps, hx of Benign prostatic hypertrophy E.D. Diabetes mellitus, type II - diet COPD Allergic rhinitis Asthma  Social History: Reviewed history from 04/02/2008 and no changes required. Former Smoker Alcohol use-no Single no children reitred - [place] [employer]  - aug 2009  Review of Systems       The patient complains of weight gain.         all otherwise negative . no OSA symptoms, but some daytime fatigue, no CP, does some increase in nasal allergy type secretions as well with some post nasal gtt and cough  Physical Exam  General:  alert and overweight-appearing.   Head:  normocephalic and atraumatic.   Eyes:  vision grossly intact, pupils equal, and pupils round.   Ears:  R ear normal and L ear normal.   Nose:  no external deformity and no nasal discharge.   Mouth:  no gingival abnormalities and pharynx pink and moist.   Neck:  supple and no masses.   Lungs:  normal respiratory effort and normal breath sounds.   Heart:  normal rate, regular rhythm, and no murmur.   Abdomen:  soft, non-tender, and normal bowel sounds.   Msk:  no joint tenderness and no joint swelling.   Extremities:  no edema, no ulcers  Neurologic:  cranial nerves II-XII intact and strength normal in all extremities.     Impression & Recommendations:  Problem # 1:  FATIGUE (ICD-780.79) .exam benign, ok to follow, check routine labs Orders: TLB-B12 + Folate Pnl ([id number]) TLB-BMP (Basic Metabolic Panel-BMET) (80048-METABOL) TLB-CBC Platelet - w/Differential (85025-CBCD) TLB-Hepatic/Liver Function Pnl (80076-HEPATIC) TLB-TSH (Thyroid Stimulating Hormone) (84443-TSH)  Problem # 2:  DIABETES MELLITUS, TYPE II (ICD-250.00)  His updated medication list for this problem includes:    Amlodipine Besy-benazepril Hcl 5-20 Mg Caps (Amlodipine besy-benazepril hcl) .Marland Kitchen... 1 by mouth once daily    Ecotrin Low Strength 81 Mg Tbec (Aspirin) .Marland Kitchen... 1  by mouth qd  Labs Reviewed: Creat: 1.1 (04/02/2008)    Reviewed HgBA1c results: 6.4 (04/02/2008)  6.1 (10/05/2007)  Orders: TLB-Lipid Panel (80061-LIPID) TLB-A1C / Hgb A1C (Glycohemoglobin) (83036-A1C) TLB-Microalbumin/Creat Ratio, Urine (82043-MALB) stable overall by hx and exam, ok to continue meds/tx as is  - check labs as above  Problem # 3:  HYPERLIPIDEMIA (ICD-272.4)  His updated medication list for this problem includes:    Lipitor 10 Mg Tabs (Atorvastatin calcium) .Marland Kitchen... 1 by mouth once daily  Labs Reviewed: SGOT: 20 (10/05/2007)   SGPT: 25 (10/05/2007)   HDL:54.3 (04/02/2008), 49.9 (10/05/2007)  LDL:71 (04/02/2008), 73 (10/05/2007)  Chol:132 (04/02/2008), 128 (10/05/2007)  Trig:33 (04/02/2008), 25 (10/05/2007) stable overall by hx and exam,  ok to continue meds/tx as is - chekc labs, goal ldl less than 70  Problem # 4:  ASTHMA (ICD-493.90)  His updated medication list for this problem includes:    Proair Hfa 108 (90 Base) Mcg/act Aers (Albuterol sulfate) .Marland Kitchen... 2 puffs qid as needed    Symbicort 160-4.5 Mcg/act Aero (Budesonide-formoterol fumarate) .Marland Kitchen... 2 puffs two times a day change the asmanex to above  Problem # 5:  ALLERGIC RHINITIS (ICD-477.9)  His updated medication list for this problem includes:    Cetirizine Hcl 10 Mg Tabs (Cetirizine hcl) .Marland Kitchen... 1 by mouth once daily as needed allergies treat as above, f/u any worsening signs or symptoms   Problem # 6:  COPD (ICD-496)  His updated medication list for this problem includes:    Proair Hfa 108 (90 Base) Mcg/act Aers (Albuterol sulfate) .Marland Kitchen... 2 puffs qid as needed    Symbicort 160-4.5 Mcg/act Aero (Budesonide-formoterol fumarate) .Marland Kitchen... 2 puffs two times a day   stable overall by hx and exam, ok to continue meds/tx as is   Complete Medication List: 1)  Indomethacin 50 Mg Caps (Indomethacin) .Marland Kitchen.. 1 by mouth three times a day prn 2)  Lipitor 10 Mg Tabs (Atorvastatin calcium) .Marland Kitchen.. 1 by mouth once daily 3)   Amlodipine Besy-benazepril Hcl 5-20 Mg Caps (Amlodipine besy-benazepril hcl) .Marland Kitchen.. 1 by mouth once daily 4)  Allopurinol 300 Mg Tabs (Allopurinol) .Marland Kitchen.. 1 by mouth once daily 5)  Ecotrin Low Strength 81 Mg Tbec (Aspirin) .Marland Kitchen.. 1 by mouth qd 6)  Viagra 100 Mg Tabs (Sildenafil citrate) .Marland Kitchen.. 1 by mouth once daily as needed 7)  Proair Hfa 108 (90 Base) Mcg/act Aers (Albuterol sulfate) .... 2 puffs qid as needed 8)  Symbicort 160-4.5 Mcg/act Aero (Budesonide-formoterol fumarate) .... 2 puffs two times a day 9)  Uroxatral 10 Mg Xr24h-tab (Alfuzosin hcl) .Marland Kitchen.. 1 by mouth daily with a meal 10)  Fish Oil 500 Mg Caps (Omega-3 fatty acids) .Marland Kitchen.. 1 by mouth once daily 11)  Cetirizine Hcl 10 Mg Tabs (Cetirizine hcl) .Marland Kitchen.. 1 by mouth once daily as needed allergies  Other Orders: TLB-PSA (Prostate Specific Antigen) (84153-PSA) TLB-Udip ONLY (81003-UDIP) TLB-Uric Acid, Blood (84550-URIC)  Patient Instructions: 1)  stop the asmanex 2)  start the symbicort 2 puffs two times a day 3)  take the generic zyrtec for alllergies as needed 4)  Continue all medications that you may have been taking previously 5)  Please go to the Lab in the basement for your blood tests today 6)  Please schedule a follow-up appointment in 6 months. Prescriptions: CETIRIZINE HCL 10 MG TABS (CETIRIZINE HCL) 1 by mouth once daily as needed allergies  #30 x 11   Entered and Authorized by:   Corwin Levins MD   Signed by:   Corwin Levins MD on 10/04/2008   Method used:   Print then Give to Patient   RxID:   [id number] SYMBICORT 160-4.5 MCG/ACT AERO (BUDESONIDE-FORMOTEROL FUMARATE) 2 puffs two times a day  #1 x 11   Entered and Authorized by:   Corwin Levins MD   Signed by:   Corwin Levins MD on 10/04/2008   Method used:   Print then Give to Patient   RxID:   [id number] VIAGRA 100 MG  TABS (SILDENAFIL CITRATE) 1 by mouth once daily as needed  #10 x 11   Entered and Authorized by:   Corwin Levins MD   Signed by:   Corwin Levins MD  on 10/04/2008  Method used:   Print then Give to Patient   RxID:   [id number]

## 2010-04-24 NOTE — Miscellaneous (Signed)
Summary: Orders Update pft charges  Clinical Lists Changes  Orders: Added new Service order of Carbon Monoxide diffusing w/capacity (94720) - Signed Added new Service order of Lung Volumes (94240) - Signed Added new Service order of Spirometry (Pre & Post) (94060) - Signed 

## 2010-04-24 NOTE — Assessment & Plan Note (Signed)
Summary: FU---$50---STC   Vital Signs:  Patient Profile:   70 Years Old Male Weight:      185 pounds Temp:     97.9 degrees F oral Pulse rate:   65 / minute BP sitting:   129 / 72  (right arm) Cuff size:   regular  Vitals Entered By: Jerilynn Mages MA (October 05, 2007 2:46 PM)                  Chief Complaint:  follow-up.  History of Present Illness: overall doing well, no complatints, wt stable, trying to watch his diet, compliant with meds, denies polys or low sugars    Updated Prior Medication List: INDOMETHACIN 50 MG  CAPS (INDOMETHACIN) 1 by mouth three times a day prn LIPITOR 10 MG  TABS (ATORVASTATIN CALCIUM) 1 by mouth once daily AMLODIPINE BESY-BENAZEPRIL HCL 5-20 MG  CAPS (AMLODIPINE BESY-BENAZEPRIL HCL) 1 by mouth once daily ALLOPURINOL 300 MG  TABS (ALLOPURINOL) 1 by mouth once daily ECOTRIN LOW STRENGTH 81 MG  TBEC (ASPIRIN) 1 by mouth qd VIAGRA 100 MG  TABS (SILDENAFIL CITRATE) 1 by mouth once daily as needed  Current Allergies (reviewed today): No known allergies   Past Medical History:    Reviewed history from 03/09/2007 and no changes required:       Gout       Hyperlipidemia       Hypertension       Colonic polyps, hx of       Benign prostatic hypertrophy       E.D.       Diabetes mellitus, type II - diet  Past Surgical History:    Reviewed history from 03/09/2007 and no changes required:       Appendectomy   Social History:    Reviewed history from 03/09/2007 and no changes required:       Former Smoker       Alcohol use-no    Review of Systems       all otherwise negative - no CP or other CV symptoms, has some ongoing fatigue but denies hypersomnolence   Physical Exam  General:     Well-developed,well-nourished,in no acute distress; alert,appropriate and cooperative throughout examination Head:     Normocephalic and atraumatic without obvious abnormalities. No apparent alopecia or balding. Eyes:     No corneal or conjunctival  inflammation noted. EOMI. Perrla. Ears:     External ear exam shows no significant lesions or deformities.  Otoscopic examination reveals clear canals, tympanic membranes are intact bilaterally without bulging, retraction, inflammation or discharge. Hearing is grossly normal bilaterally. Nose:     External nasal examination shows no deformity or inflammation. Nasal mucosa are pink and moist without lesions or exudates. Mouth:     Oral mucosa and oropharynx without lesions or exudates.  Teeth in good repair. Neck:     No deformities, masses, or tenderness noted. Lungs:     Normal respiratory effort, chest expands symmetrically. Lungs are clear to auscultation, no crackles or wheezes. Heart:     Normal rate and regular rhythm. S1 and S2 normal without gallop, murmur, click, rub or other extra sounds. Extremities:     no edema, no ulcers     Impression & Recommendations:  Problem # 1:  DIABETES MELLITUS, TYPE II (ICD-250.00)  His updated medication list for this problem includes:    Amlodipine Besy-benazepril Hcl 5-20 Mg Caps (Amlodipine besy-benazepril hcl) .Marland Kitchen... 1 by mouth once daily    Ecotrin Low  Strength 81 Mg Tbec (Aspirin) .Marland Kitchen... 1 by mouth qd  Orders: TLB-A1C / Hgb A1C (Glycohemoglobin) (83036-A1C) TLB-Microalbumin/Creat Ratio, Urine (82043-MALB) stable overall by hx and exam, ok to continue meds/tx as is   Problem # 2:  FATIGUE (ICD-780.79) exam benign, check routine labs Orders: TLB-BMP (Basic Metabolic Panel-BMET) (80048-METABOL) TLB-CBC Platelet - w/Differential (85025-CBCD) TLB-TSH (Thyroid Stimulating Hormone) (84443-TSH)   Problem # 3:  HYPERTENSION (ICD-401.9)  His updated medication list for this problem includes:    Amlodipine Besy-benazepril Hcl 5-20 Mg Caps (Amlodipine besy-benazepril hcl) .Marland Kitchen... 1 by mouth once daily  BP today: 129/72 Prior BP: 115/67 (03/09/2007)  Labs Reviewed: Creat: 0.8 (03/09/2007) Chol: 140 (03/09/2007)   HDL: 49.6 (03/09/2007)    LDL: 84 (03/09/2007)   TG: 34 (03/09/2007) stable overall by hx and exam, ok to continue meds/tx as is   Problem # 4:  HYPERLIPIDEMIA (ICD-272.4)  His updated medication list for this problem includes:    Lipitor 10 Mg Tabs (Atorvastatin calcium) .Marland Kitchen... 1 by mouth once daily  Orders: TLB-Lipid Panel (80061-LIPID)  Labs Reviewed: Chol: 140 (03/09/2007)   HDL: 49.6 (03/09/2007)   LDL: 84 (03/09/2007)   TG: 34 (03/09/2007) SGOT: 22 (03/09/2007)   SGPT: 25 (03/09/2007) stable overall by hx and exam, ok to continue meds/tx as is   Complete Medication List: 1)  Indomethacin 50 Mg Caps (Indomethacin) .Marland Kitchen.. 1 by mouth three times a day prn 2)  Lipitor 10 Mg Tabs (Atorvastatin calcium) .Marland Kitchen.. 1 by mouth once daily 3)  Amlodipine Besy-benazepril Hcl 5-20 Mg Caps (Amlodipine besy-benazepril hcl) .Marland Kitchen.. 1 by mouth once daily 4)  Allopurinol 300 Mg Tabs (Allopurinol) .Marland Kitchen.. 1 by mouth once daily 5)  Ecotrin Low Strength 81 Mg Tbec (Aspirin) .Marland Kitchen.. 1 by mouth qd 6)  Viagra 100 Mg Tabs (Sildenafil citrate) .Marland Kitchen.. 1 by mouth once daily as needed  Other Orders: TLB-PSA (Prostate Specific Antigen) (84153-PSA) TLB-Hepatic/Liver Function Pnl (80076-HEPATIC)   Patient Instructions: 1)  Continue all medications that you may have been taking previously 2)  Please go to the Lab in the basement for your blood tests today 3)  Please schedule a follow-up appointment in 6 months.   Prescriptions: INDOMETHACIN 50 MG  CAPS (INDOMETHACIN) 1 by mouth three times a day prn  #90 x 5   Entered and Authorized by:   Corwin Levins MD   Signed by:   Corwin Levins MD on 10/05/2007   Method used:   Print then Give to Patient   RxID:   [id number] LIPITOR 10 MG  TABS (ATORVASTATIN CALCIUM) 1 by mouth once daily  #30 x 11   Entered and Authorized by:   Corwin Levins MD   Signed by:   Corwin Levins MD on 10/05/2007   Method used:   Print then Give to Patient   RxID:   [id number] AMLODIPINE BESY-BENAZEPRIL HCL 5-20 MG   CAPS (AMLODIPINE BESY-BENAZEPRIL HCL) 1 by mouth once daily  #30 x 11   Entered and Authorized by:   Corwin Levins MD   Signed by:   Corwin Levins MD on 10/05/2007   Method used:   Print then Give to Patient   RxID:   [id number] ALLOPURINOL 300 MG  TABS (ALLOPURINOL) 1 by mouth once daily  #30 x 11   Entered and Authorized by:   Corwin Levins MD   Signed by:   Corwin Levins MD on 10/05/2007   Method used:   Print then Give to Patient  RxID:   [id number] VIAGRA 100 MG  TABS (SILDENAFIL CITRATE) 1 by mouth once daily as needed  #10 x 11   Entered and Authorized by:   Corwin Levins MD   Signed by:   Corwin Levins MD on 10/05/2007   Method used:   Print then Give to Patient   RxID:   [id number]  ]

## 2010-04-24 NOTE — Progress Notes (Signed)
  Phone Note Refill Request Message from:  Fax from Pharmacy on February 21, 2010 4:30 PM  Refills Requested: Medication #1:  LIPITOR 10 MG  TABS 1 by mouth once daily   Dosage confirmed as above?Dosage Confirmed   Notes: [facility]  Medication #2:  ALLOPURINOL 300 MG  TABS 1 by mouth once daily   Dosage confirmed as above?Dosage Confirmed   Notes: [facility] Initial call taken by: Zella Ball Ewing CMA (AAMA),  February 21, 2010 4:30 PM    Prescriptions: ALLOPURINOL 300 MG  TABS (ALLOPURINOL) 1 by mouth once daily  #90 x 3   Entered by:   Zella Ball Ewing CMA (AAMA)   Authorized by:   Corwin Levins MD   Signed by:   Scharlene Gloss CMA (AAMA) on 02/21/2010   Method used:   Faxed to ...       [facility] education [facility])       [address] [address]       [place], [place]  [address]       Ph: [phone]       Fax: [phone]   RxID:   [id number] LIPITOR 10 MG  TABS (ATORVASTATIN CALCIUM) 1 by mouth once daily  #90 Tablet x 3   Entered by:   Zella Ball Ewing CMA (AAMA)   Authorized by:   Corwin Levins MD   Signed by:   Scharlene Gloss CMA (AAMA) on 02/21/2010   Method used:   Faxed to ...       [facility] Environmental education officer)       [address] [address]       [place], [place]  [address]       Ph: [phone]       Fax: [phone]   RxID:   [id number]

## 2010-04-24 NOTE — Assessment & Plan Note (Signed)
Summary: FLU SHOT/Ahja Martello/CD   Nurse Visit    Prior Medications: INDOMETHACIN 50 MG  CAPS (INDOMETHACIN) 1 by mouth three times a day prn LIPITOR 10 MG  TABS (ATORVASTATIN CALCIUM) 1 by mouth once daily AMLODIPINE BESY-BENAZEPRIL HCL 5-20 MG  CAPS (AMLODIPINE BESY-BENAZEPRIL HCL) 1 by mouth once daily ALLOPURINOL 300 MG  TABS (ALLOPURINOL) 1 by mouth once daily ECOTRIN LOW STRENGTH 81 MG  TBEC (ASPIRIN) 1 by mouth qd VIAGRA 100 MG  TABS (SILDENAFIL CITRATE) 1 by mouth once daily as needed PROMETHAZINE-CODEINE 6.25-10 MG/5ML  SYRP (PROMETHAZINE-CODEINE) Take 1 teaspoonful four times a day as needed PROAIR HFA 108 (90 BASE) MCG/ACT  AERS (ALBUTEROL SULFATE) 2 puffs qid as needed ASMANEX 60 METERED DOSES 220 MCG/INH  AEPB (MOMETASONE FUROATE) 1 puff qpm Current Allergies: No known allergies     Orders Added: 1)  Flu Vaccine 50yrs + [78295] 2)  Administration Flu vaccine [G0008]   Flu Vaccine Consent Questions     Do you have a history of severe allergic reactions to this vaccine? no    Any prior history of allergic reactions to egg and/or gelatin? no    Do you have a sensitivity to the preservative Thimersol? no    Do you have a past history of Guillan-Barre Syndrome? no    Do you currently have an acute febrile illness? no    Have you ever had a severe reaction to latex? no    Vaccine information given and explained to patient? yes    Are you currently pregnant? no    Lot Number:[id number]   Site Given  Left Deltoid IM] .medflu

## 2010-04-24 NOTE — Assessment & Plan Note (Signed)
Summary: 6 MO ROV /NWS  $50   Vital Signs:  Patient Profile:   70 Years Old Male Weight:      197 pounds O2 Sat:      97 % O2 treatment:    Room Air Temp:     97.5 degrees F oral Pulse rate:   77 / minute BP sitting:   138 / 64  (left arm) Cuff size:   regular  Vitals Entered By: Payton Spark CMA (April 02, 2008 3:23 PM)                 Preventive Care Screening  Last Pneumovax:    Next Due:  03/2012   Chief Complaint:  6 mo rov.  History of Present Illness: re-gained some wt now in retirmenet; hd recent PSA 2.8 but also tx for UTI  - has plans to go back for f/u later this wk; started in uroxatrlal 10 once daily; drinking about 8 glassess water per day with recent UTI but denies orthopena, pnd, or LE edema; denies polys or low sugars, cbg's at home in the lower 100's    Updated Prior Medication List: INDOMETHACIN 50 MG  CAPS (INDOMETHACIN) 1 by mouth three times a day prn LIPITOR 10 MG  TABS (ATORVASTATIN CALCIUM) 1 by mouth once daily AMLODIPINE BESY-BENAZEPRIL HCL 5-20 MG  CAPS (AMLODIPINE BESY-BENAZEPRIL HCL) 1 by mouth once daily ALLOPURINOL 300 MG  TABS (ALLOPURINOL) 1 by mouth once daily ECOTRIN LOW STRENGTH 81 MG  TBEC (ASPIRIN) 1 by mouth qd VIAGRA 100 MG  TABS (SILDENAFIL CITRATE) 1 by mouth once daily as needed PROAIR HFA 108 (90 BASE) MCG/ACT  AERS (ALBUTEROL SULFATE) 2 puffs qid as needed ASMANEX 60 METERED DOSES 220 MCG/INH  AEPB (MOMETASONE FUROATE) 1 puff qpm UROXATRAL 10 MG XR24H-TAB (ALFUZOSIN HCL) 1 by mouth daily with a meal  Current Allergies (reviewed today): No known allergies   Past Medical History:    Reviewed history from 10/20/2007 and no changes required:       Gout       Hyperlipidemia       Hypertension       Colonic polyps, hx of       Benign prostatic hypertrophy       E.D.       Diabetes mellitus, type II - diet       COPD  Past Surgical History:    Reviewed history from 03/09/2007 and no changes required:  Appendectomy   Social History:    Reviewed history from 03/09/2007 and no changes required:       Former Smoker       Alcohol use-no       Single       no children       [facility] - [place] [employer]     Review of Systems       all otherwise negative    Physical Exam  General:     alert and overweight-appearing.   Head:     Normocephalic and atraumatic without obvious abnormalities. No apparent alopecia or balding. Eyes:     No corneal or conjunctival inflammation noted. EOMI. Perrla Ears:     External ear exam shows no significant lesions or deformities.  Otoscopic examination reveals clear canals, tympanic membranes are intact bilaterally without bulging, retraction, inflammation or discharge. Hearing is grossly normal bilaterally. Nose:     External nasal examination shows no deformity or inflammation. Nasal mucosa are pink and moist without lesions or  exudates. Mouth:     Oral mucosa and oropharynx without lesions or exudates.  Teeth in good repair. Neck:     No deformities, masses, or tenderness noted. Lungs:     Normal respiratory effort, chest expands symmetrically. Lungs are clear to auscultation, no crackles or wheezes. Heart:     Normal rate and regular rhythm. S1 and S2 normal without gallop, murmur, click, rub or other extra sounds. Extremities:     no edema, no ulcers     Impression & Recommendations:  Problem # 1:  DIABETES MELLITUS, TYPE II (ICD-250.00)  His updated medication list for this problem includes:    Amlodipine Besy-benazepril Hcl 5-20 Mg Caps (Amlodipine besy-benazepril hcl) .Marland Kitchen... 1 by mouth once daily    Ecotrin Low Strength 81 Mg Tbec (Aspirin) .Marland Kitchen... 1 by mouth qd  Labs Reviewed: HgBA1c: 6.1 (10/05/2007)   Creat: 0.9 (10/05/2007)   Microalbumin: 0.1 (10/05/2007) stable overall by hx and exam, ok to continue meds/tx as is - cont cbg's at home Orders: TLB-BMP (Basic Metabolic Panel-BMET) (80048-METABOL) TLB-A1C / Hgb A1C (Glycohemoglobin)  (83036-A1C) TLB-Lipid Panel (80061-LIPID)   Problem # 2:  HYPERTENSION (ICD-401.9)  His updated medication list for this problem includes:    Amlodipine Besy-benazepril Hcl 5-20 Mg Caps (Amlodipine besy-benazepril hcl) .Marland Kitchen... 1 by mouth once daily  BP today: 138/64 Prior BP: 125/69 (10/20/2007)  Labs Reviewed: Creat: 0.9 (10/05/2007) Chol: 128 (10/05/2007)   HDL: 49.9 (10/05/2007)   LDL: 73 (10/05/2007)   TG: 25 (10/05/2007) stable overall by hx and exam, ok to continue meds/tx as is - cont meds   Problem # 3:  HYPERLIPIDEMIA (ICD-272.4)  His updated medication list for this problem includes:    Lipitor 10 Mg Tabs (Atorvastatin calcium) .Marland Kitchen... 1 by mouth once daily  Labs Reviewed: Chol: 128 (10/05/2007)   HDL: 49.9 (10/05/2007)   LDL: 73 (10/05/2007)   TG: 25 (10/05/2007) SGOT: 20 (10/05/2007)   SGPT: 25 (10/05/2007) .stable  - to check labs today  Complete Medication List: 1)  Indomethacin 50 Mg Caps (Indomethacin) .Marland Kitchen.. 1 by mouth three times a day prn 2)  Lipitor 10 Mg Tabs (Atorvastatin calcium) .Marland Kitchen.. 1 by mouth once daily 3)  Amlodipine Besy-benazepril Hcl 5-20 Mg Caps (Amlodipine besy-benazepril hcl) .Marland Kitchen.. 1 by mouth once daily 4)  Allopurinol 300 Mg Tabs (Allopurinol) .Marland Kitchen.. 1 by mouth once daily 5)  Ecotrin Low Strength 81 Mg Tbec (Aspirin) .Marland Kitchen.. 1 by mouth qd 6)  Viagra 100 Mg Tabs (Sildenafil citrate) .Marland Kitchen.. 1 by mouth once daily as needed 7)  Proair Hfa 108 (90 Base) Mcg/act Aers (Albuterol sulfate) .... 2 puffs qid as needed 8)  Asmanex 60 Metered Doses 220 Mcg/inh Aepb (Mometasone furoate) .Marland Kitchen.. 1 puff qpm 9)  Uroxatral 10 Mg Xr24h-tab (Alfuzosin hcl) .Marland Kitchen.. 1 by mouth daily with a meal  Other Orders: H1N1 vaccine G code (Z6109) Tdap => 58yrs IM (60454) Admin 1st Vaccine (09811)   Patient Instructions: 1)  you received the h1n1 flu shot today, and tetanus today 2)  Please go to the Lab in the basement for your blood tests today 3)  Continue all medications that you may  have been taking previously 4)  Please schedule a follow-up appointment in 6 months.     Tetanus/Td Vaccine    Vaccine Type: Tdap    Site: right deltoid    Mfr: [email or web address]    Dose: 0.5 ml    Route: IM    Given by: Payton Spark CMA    Exp. Date:  02/24/2010    Lot #: [id number]    VIS given: 02/08/07 version given April 02, 2008.  H1N1 # 1    Vaccine Type: H1N1 vaccine G code    Site: right deltoid    Mfr: [email or web address]    Dose: 0.5 ml    Route: IM    Given by: Payton Spark CMA    Exp. Date: 4/10    Lot #: [id number]    VIS given: 12/21/2008 given April 02, 2008.

## 2010-04-24 NOTE — Assessment & Plan Note (Signed)
Summary: BREATHING PROBLEM X 4 YRS---$50----STC   Vital Signs:  Patient Profile:   70 Years Old Male Weight:      186 pounds Temp:     98.0 degrees F oral Pulse rate:   82 / minute BP sitting:   125 / 69  (right arm) Cuff size:   regular  Vitals Entered By: Jerilynn Mages (October 20, 2007 8:09 AM)                 Chief Complaint:  breathing problem x 4 years.  History of Present Illness: here with onset 4 ks onset sob at night, seems to occur in the middle of thenight, some better to sit up; no problems during the day more than usual with doe ro excercise intolerance; no significnat fever, no hx of cardiac problems, last stress test approx 2000 - neg.  No hxof CHF.     Updated Prior Medication List: INDOMETHACIN 50 MG  CAPS (INDOMETHACIN) 1 by mouth three times a day prn LIPITOR 10 MG  TABS (ATORVASTATIN CALCIUM) 1 by mouth once daily AMLODIPINE BESY-BENAZEPRIL HCL 5-20 MG  CAPS (AMLODIPINE BESY-BENAZEPRIL HCL) 1 by mouth once daily ALLOPURINOL 300 MG  TABS (ALLOPURINOL) 1 by mouth once daily ECOTRIN LOW STRENGTH 81 MG  TBEC (ASPIRIN) 1 by mouth qd VIAGRA 100 MG  TABS (SILDENAFIL CITRATE) 1 by mouth once daily as needed PROMETHAZINE-CODEINE 6.25-10 MG/5ML  SYRP (PROMETHAZINE-CODEINE) Take 1 teaspoonful four times a day as needed PROAIR HFA 108 (90 BASE) MCG/ACT  AERS (ALBUTEROL SULFATE) 2 puffs qid as needed ASMANEX 60 METERED DOSES 220 MCG/INH  AEPB (MOMETASONE FUROATE) 1 puff qpm  Current Allergies (reviewed today): No known allergies   Past Medical History:    Reviewed history from 03/09/2007 and no changes required:       Gout       Hyperlipidemia       Hypertension       Colonic polyps, hx of       Benign prostatic hypertrophy       E.D.       Diabetes mellitus, type II - diet       COPD  Past Surgical History:    Reviewed history from 03/09/2007 and no changes required:       Appendectomy   Social History:    Reviewed history from 03/09/2007 and no changes  required:       Former Smoker       Alcohol use-no    Review of Systems       all otherwise negative    Physical Exam  General:     alert and overweight-appearing.   Head:     Normocephalic and atraumatic without obvious abnormalities. No apparent alopecia or balding. Eyes:     No corneal or conjunctival inflammation noted. EOMI. Perrla Ears:     External ear exam shows no significant lesions or deformities.  Otoscopic examination reveals clear canals, tympanic membranes are intact bilaterally without bulging, retraction, inflammation or discharge. Hearing is grossly normal bilaterally. Nose:     External nasal examination shows no deformity or inflammation. Nasal mucosa are pink and moist without lesions or exudates. Mouth:     Oral mucosa and oropharynx without lesions or exudates.  Teeth in good repair. Neck:     No deformities, masses, or tenderness noted. Lungs:     Normal respiratory effort, chest expands symmetrically. Lungs are clear to auscultation, no crackles or wheezes. Heart:     Normal rate  and regular rhythm. S1 and S2 normal without gallop, murmur, click, rub or other extra sounds. Extremities:     no edema, no ulcers     Impression & Recommendations:  Problem # 1:  COPD (ICD-496)  His updated medication list for this problem includes:    Proair Hfa 108 (90 Base) Mcg/act Aers (Albuterol sulfate) .Marland Kitchen... 2 puffs qid as needed    Asmanex 60 Metered Doses 220 Mcg/inh Aepb (Mometasone furoate) .Marland Kitchen... 1 puff qpm check PFT's, cxr, start asmanex at bedtime for asthmatic component, and proair hfa prn Orders: T-2 View CXR, Same Day (71020.5TC) Pulmonary Referral (Pulmonary)   Problem # 2:  DIABETES MELLITUS, TYPE II (ICD-250.00)  His updated medication list for this problem includes:    Amlodipine Besy-benazepril Hcl 5-20 Mg Caps (Amlodipine besy-benazepril hcl) .Marland Kitchen... 1 by mouth once daily    Ecotrin Low Strength 81 Mg Tbec (Aspirin) .Marland Kitchen... 1 by mouth  qd  Labs Reviewed: HgBA1c: 6.1 (10/05/2007)   Creat: 0.9 (10/05/2007)   Microalbumin: 0.1 (10/05/2007) stable overall by hx and exam, ok to continue meds/tx as is   Problem # 3:  HYPERTENSION (ICD-401.9)  His updated medication list for this problem includes:    Amlodipine Besy-benazepril Hcl 5-20 Mg Caps (Amlodipine besy-benazepril hcl) .Marland Kitchen... 1 by mouth once daily  BP today: 125/69 Prior BP: 129/72 (10/05/2007)  Labs Reviewed: Creat: 0.9 (10/05/2007) Chol: 128 (10/05/2007)   HDL: 49.9 (10/05/2007)   LDL: 73 (10/05/2007)   TG: 25 (10/05/2007) stable overall by hx and exam, ok to continue meds/tx as is   Complete Medication List: 1)  Indomethacin 50 Mg Caps (Indomethacin) .Marland Kitchen.. 1 by mouth three times a day prn 2)  Lipitor 10 Mg Tabs (Atorvastatin calcium) .Marland Kitchen.. 1 by mouth once daily 3)  Amlodipine Besy-benazepril Hcl 5-20 Mg Caps (Amlodipine besy-benazepril hcl) .Marland Kitchen.. 1 by mouth once daily 4)  Allopurinol 300 Mg Tabs (Allopurinol) .Marland Kitchen.. 1 by mouth once daily 5)  Ecotrin Low Strength 81 Mg Tbec (Aspirin) .Marland Kitchen.. 1 by mouth qd 6)  Viagra 100 Mg Tabs (Sildenafil citrate) .Marland Kitchen.. 1 by mouth once daily as needed 7)  Promethazine-codeine 6.25-10 Mg/31ml Syrp (Promethazine-codeine) .... Take 1 teaspoonful four times a day as needed 8)  Proair Hfa 108 (90 Base) Mcg/act Aers (Albuterol sulfate) .... 2 puffs qid as needed 9)  Asmanex 60 Metered Doses 220 Mcg/inh Aepb (Mometasone furoate) .Marland Kitchen.. 1 puff qpm   Patient Instructions: 1)  Please take all new medications as prescribed 2)  Continue all medications that you may have been taking previously 3)  Please go to Radology in the basement level for your X-Ray today 4)  You will be contacted about the referral(s) to: lung testing (pulmonary function tests) 5)  followup as recommended at your last office visit   Prescriptions: ASMANEX 60 METERED DOSES 220 MCG/INH  AEPB (MOMETASONE FUROATE) 1 puff qpm  #1 x 11   Entered and Authorized by:   Corwin Levins  MD   Signed by:   Corwin Levins MD on 10/20/2007   Method used:   Print then Give to Patient   RxID:   [id number] PROAIR HFA 108 (90 BASE) MCG/ACT  AERS (ALBUTEROL SULFATE) 2 puffs qid as needed  #1 x 11   Entered and Authorized by:   Corwin Levins MD   Signed by:   Corwin Levins MD on 10/20/2007   Method used:   Print then Give to Patient   RxID:   [id number]  ]

## 2010-04-24 NOTE — Letter (Signed)
Summary: Patient Notice-Hyperplastic Polyps  [facility]  [address] [place], [place] [address]   Phone: [phone]  Fax: [phone]        May 18, 2008 MRN: [id number]    Allen Hunter [address] [place], [place]  [address]    Dear Mr. SPICKLER,  I am pleased to inform you that the colon polyp(s) removed during your recent colonoscopy was (were) found to be hyperplastic.  These types of polyps are NOT pre-cancerous.  It is therefore my recommendation that you have a repeat colonoscopy examination in 10_ years for routine colorectal cancer screening.  Should you develop new or worsening symptoms of abdominal pain, bowel habit changes or bleeding from the rectum or bowels, please schedule an evaluation with either your primary care physician or with me.  Additional information/recommendations:  __No further action with gastroenterology is needed at this time.      Please follow-up with your primary care physician for your other      healthcare needs. __Please call [phone] to schedule a return visit to review      your situation.  __Please keep your follow-up visit as already scheduled.  __Continue treatment plan as outlined the day of your exam.  Please call us if you are having persistent problems or have questions about your condition that have not been fully answered at this time.  Sincerely,  Louis Meckel MD This letter has been electronically signed by your physician.

## 2010-04-24 NOTE — Progress Notes (Signed)
Summary: Rx refill req  Phone Note Refill Request Message from:  Patient on March 07, 2010 11:11 AM  Refills Requested: Medication #1:  INDOMETHACIN 50 MG  CAPS 1 by mouth three times a day prn   Dosage confirmed as above?Dosage Confirmed   Supply Requested: 6 months  Medication #2:  CETIRIZINE HCL 10 MG TABS 1 by mouth once daily as needed allergies   Dosage confirmed as above?Dosage Confirmed   Supply Requested: 6 months  Method Requested: Fax to [facility] Initial call taken by: Margaret Pyle, CMA,  March 07, 2010 11:11 AM    Prescriptions: CETIRIZINE HCL 10 MG TABS (CETIRIZINE HCL) 1 by mouth once daily as needed allergies  #90 x 2   Entered by:   Margaret Pyle, CMA   Authorized by:   Corwin Levins MD   Signed by:   Margaret Pyle, CMA on 03/07/2010   Method used:   Faxed to ...       [facility])             , [place]         Ph: [phone]       Fax: [phone]   RxID:   [id number] INDOMETHACIN 50 MG  CAPS (INDOMETHACIN) 1 by mouth three times a day prn  #270 x 2   Entered by:   Margaret Pyle, CMA   Authorized by:   Corwin Levins MD   Signed by:   Margaret Pyle, CMA on 03/07/2010   Method used:   Faxed to ...       [facility])             , [place]         Ph: [phone]       Fax: [phone]   RxID:   [id number]

## 2010-04-30 NOTE — Assessment & Plan Note (Signed)
Summary: 6 MTH FU/STC [facility] BMP/LB   Vital Signs:  Patient profile:   70 year old male Height:      65 inches Weight:      196.13 pounds BMI:     32.76 O2 Sat:      94 % on Room air Temp:     97.9 degrees F oral Pulse rate:   90 / minute BP sitting:   102 / 62  (left arm) Cuff size:   regular  Vitals Entered By: Zella Ball Ewing CMA (AAMA) (April 23, 2010 8:09 AM)  O2 Flow:  Room air CC: 6 month ROV/RE   CC:  6 month ROV/RE.  History of Present Illness: here with acute onset mild symptoms of URi in the past 2 wks, but much improved in the past 2 days with essentially resolved headache, fever, ST, earache and nonprod cough, but does ongoing mild wheezing he states is really baseline, and jsut does not want to use the symbicort.  Pt denies CP, worsening sob, doe, wheezing, orthopnea, pnd, worsening LE edema, palps, dizziness or syncope  Pt denies new neuro symptoms such as headache, facial or extremity weakness  Pt denies polydipsia, polyuria, or low sugar symptoms such as shakiness improved with eating.  Overall good compliance with meds, trying to follow low chol, DM diet, wt stable, little excercise however  CBG's in the lower 100's.  No  night sweats, loss of appetite or other constitutional symptoms but has been able to lose several lbs. Overall good compliance with meds, and good tolerability. Denies worsening depressive symptoms, suicidal ideation, or panic.    Preventive Screening-Counseling & Management      Drug Use:  no.    Problems Prior to Update: 1)  Swelling Mass or Lump in Head and Neck  (ICD-784.2) 2)  Preventive Health Care  (ICD-V70.0) 3)  Asthma  (ICD-493.90) 4)  Allergic Rhinitis  (ICD-477.9) 5)  Fatigue  (ICD-780.79) 6)  Back Pain  (ICD-724.5) 7)  COPD  (ICD-496) 8)  Special Screening Malig Neoplasms Other Sites  (ICD-V76.49) 9)  Fatigue  (ICD-780.79) 10)  Hepatotoxicity, Drug-induced, Risk of  (ICD-V58.69) 11)  Diabetes Mellitus, Type II  (ICD-250.00) 12)   Benign Prostatic Hypertrophy  (ICD-600.00) 13)  Colonic Polyps, Hx of  (ICD-V12.72) 14)  Hypertension  (ICD-401.9) 15)  Hyperlipidemia  (ICD-272.4) 16)  Gout  (ICD-274.9)  Medications Prior to Update: 1)  Indomethacin 50 Mg  Caps (Indomethacin) .Marland Kitchen.. 1 By Mouth Three Times A Day Prn 2)  Lipitor 10 Mg  Tabs (Atorvastatin Calcium) .Marland Kitchen.. 1 By Mouth Once Daily 3)  Amlodipine Besy-Benazepril Hcl 5-20 Mg  Caps (Amlodipine Besy-Benazepril Hcl) .Marland Kitchen.. 1 By Mouth Once Daily 4)  Allopurinol 300 Mg  Tabs (Allopurinol) .Marland Kitchen.. 1 By Mouth Once Daily 5)  Ecotrin Low Strength 81 Mg  Tbec (Aspirin) .Marland Kitchen.. 1 By Mouth Qd 6)  Levitra 20 Mg Tabs (Vardenafil Hcl) .Marland Kitchen.. 1 By Mouth Every Other Day As Needed 7)  Proair Hfa 108 (90 Base) Mcg/act  Aers (Albuterol Sulfate) .... 2 Puffs Qid As Needed 8)  Symbicort 160-4.5 Mcg/act Aero (Budesonide-Formoterol Fumarate) .... 2 Puffs Two Times A Day 9)  Uroxatral 10 Mg Xr24h-Tab (Alfuzosin Hcl) .Marland Kitchen.. 1 By Mouth Daily With A Meal 10)  Fish Oil 500 Mg Caps (Omega-3 Fatty Acids) .Marland Kitchen.. 1 By Mouth Once Daily 11)  Cetirizine Hcl 10 Mg Tabs (Cetirizine Hcl) .Marland Kitchen.. 1 By Mouth Once Daily As Needed Allergies 12)  Onetouch Test  Strp (Glucose Blood) .... Test  As Directed 13)  Onetouch Lancets  Misc (Lancets) .... Use As Directed  Current Medications (verified): 1)  Indomethacin 50 Mg  Caps (Indomethacin) .Marland Kitchen.. 1 By Mouth Three Times A Day Prn 2)  Lipitor 10 Mg  Tabs (Atorvastatin Calcium) .Marland Kitchen.. 1 By Mouth Once Daily 3)  Amlodipine Besy-Benazepril Hcl 5-20 Mg  Caps (Amlodipine Besy-Benazepril Hcl) .Marland Kitchen.. 1 By Mouth Once Daily 4)  Allopurinol 300 Mg  Tabs (Allopurinol) .Marland Kitchen.. 1 By Mouth Once Daily 5)  Ecotrin Low Strength 81 Mg  Tbec (Aspirin) .Marland Kitchen.. 1 By Mouth Qd 6)  Levitra 20 Mg Tabs (Vardenafil Hcl) .Marland Kitchen.. 1 By Mouth Every Other Day As Needed 7)  Proair Hfa 108 (90 Base) Mcg/act  Aers (Albuterol Sulfate) .... 2 Puffs Qid As Needed 8)  Symbicort 160-4.5 Mcg/act Aero (Budesonide-Formoterol Fumarate)  .... 2 Puffs Two Times A Day 9)  Uroxatral 10 Mg Xr24h-Tab (Alfuzosin Hcl) .Marland Kitchen.. 1 By Mouth Daily With A Meal 10)  Fish Oil 500 Mg Caps (Omega-3 Fatty Acids) .Marland Kitchen.. 1 By Mouth Once Daily 11)  Cetirizine Hcl 10 Mg Tabs (Cetirizine Hcl) .Marland Kitchen.. 1 By Mouth Once Daily As Needed Allergies 12)  Onetouch Test  Strp (Glucose Blood) .... Test As Directed 13)  Onetouch Lancets  Misc (Lancets) .... Use As Directed 14)  Singulair 10 Mg Tabs (Montelukast Sodium) .Marland Kitchen.. 1po Once Daily  Allergies (verified): No Known Drug Allergies  Past History:  Past Medical History: Last updated: 10/08/2009 Gout Hyperlipidemia Hypertension Colonic polyps, hx of Benign prostatic hypertrophy E.D. Diabetes mellitus, type II - diet COPD Allergic rhinitis Asthma  MD roster:   urology/Dr Evelene Croon - [place]                    GI/Dr Arlyce Dice                    derm/ [place] physician                     eye/optometry - Ginette Otto  Past Surgical History: Last updated: 03/09/2007 Appendectomy  Social History: Last updated: 10/08/2009 Former Smoker Alcohol use-no Single no children reitred - [employer]  - aug 2009 Drug use-no  Risk Factors: Smoking Status: quit (03/09/2007)  Review of Systems       all otherwise negative per pt -    Physical Exam  General:  alert and overweight-appearing.   Head:  normocephalic and atraumatic.   Eyes:  vision grossly intact, pupils equal, and pupils round.   Ears:  R ear normal and L ear normal.   Nose:  no external deformity and no nasal discharge.   Mouth:  no gingival abnormalities and pharynx pink and moist.   Neck:  supple and no masses.   Lungs:  normal respiratory effort, R wheezes, and L wheezes.   Heart:  normal rate and regular rhythm.   Msk:  no joint tenderness and no joint swelling.   Extremities:  no edema, no erythema  Psych:  not anxious appearing and not depressed appearing.     Impression & Recommendations:  Problem # 1:  ASTHMA  (ICD-493.90)  His updated medication list for this problem includes:    Proair Hfa 108 (90 Base) Mcg/act Aers (Albuterol sulfate) .Marland Kitchen... 2 puffs qid as needed    Symbicort 160-4.5 Mcg/act Aero (Budesonide-formoterol fumarate) .Marland Kitchen... 2 puffs two times a day    Singulair 10 Mg Tabs (Montelukast sodium) .Marland Kitchen... 1po once daily mild uncontroleed, does not want to use  the symbicort very often - will add singulair 10 mg once daily , o/w stable overall by hx and exam  Pulmonary Functions Reviewed: O2 sat: 94 (04/23/2010)  Problem # 2:  DIABETES MELLITUS, TYPE II (ICD-250.00)  His updated medication list for this problem includes:    Amlodipine Besy-benazepril Hcl 5-20 Mg Caps (Amlodipine besy-benazepril hcl) .Marland Kitchen... 1 by mouth once daily    Ecotrin Low Strength 81 Mg Tbec (Aspirin) .Marland Kitchen... 1 by mouth qd  Orders: TLB-BMP (Basic Metabolic Panel-BMET) (80048-METABOL) TLB-Lipid Panel (80061-LIPID) TLB-A1C / Hgb A1C (Glycohemoglobin) (83036-A1C)  Labs Reviewed: Creat: 0.9 (10/08/2009)    Reviewed HgBA1c results: 6.1 (10/08/2009)  5.9 (04/01/2009) stable overall by hx and exam, ok to continue meds/tx as is   Problem # 3:  HYPERTENSION (ICD-401.9)  His updated medication list for this problem includes:    Amlodipine Besy-benazepril Hcl 5-20 Mg Caps (Amlodipine besy-benazepril hcl) .Marland Kitchen... 1 by mouth once daily  BP today: 102/62 Prior BP: 120/62 (01/09/2010)  Labs Reviewed: K+: 4.8 (10/08/2009) Creat: : 0.9 (10/08/2009)   Chol: 128 (10/08/2009)   HDL: 48.20 (10/08/2009)   LDL: 73 (10/08/2009)   TG: 32.0 (10/08/2009) stable overall by hx and exam, ok to continue meds/tx as is   Problem # 4:  HYPERLIPIDEMIA (ICD-272.4)  His updated medication list for this problem includes:    Lipitor 10 Mg Tabs (Atorvastatin calcium) .Marland Kitchen... 1 by mouth once daily  Labs Reviewed: SGOT: 21 (10/08/2009)   SGPT: 21 (10/08/2009)   HDL:48.20 (10/08/2009), 45.60 (04/01/2009)  LDL:73 (10/08/2009), 82 (04/01/2009)   Chol:128 (10/08/2009), 131 (04/01/2009)  Trig:32.0 (10/08/2009), 16.0 (04/01/2009) stable overall by hx and exam, ok to continue meds/tx as is   Complete Medication List: 1)  Indomethacin 50 Mg Caps (Indomethacin) .Marland Kitchen.. 1 by mouth three times a day prn 2)  Lipitor 10 Mg Tabs (Atorvastatin calcium) .Marland Kitchen.. 1 by mouth once daily 3)  Amlodipine Besy-benazepril Hcl 5-20 Mg Caps (Amlodipine besy-benazepril hcl) .Marland Kitchen.. 1 by mouth once daily 4)  Allopurinol 300 Mg Tabs (Allopurinol) .Marland Kitchen.. 1 by mouth once daily 5)  Ecotrin Low Strength 81 Mg Tbec (Aspirin) .Marland Kitchen.. 1 by mouth qd 6)  Levitra 20 Mg Tabs (Vardenafil hcl) .Marland Kitchen.. 1 by mouth every other day as needed 7)  Proair Hfa 108 (90 Base) Mcg/act Aers (Albuterol sulfate) .... 2 puffs qid as needed 8)  Symbicort 160-4.5 Mcg/act Aero (Budesonide-formoterol fumarate) .... 2 puffs two times a day 9)  Uroxatral 10 Mg Xr24h-tab (Alfuzosin hcl) .Marland Kitchen.. 1 by mouth daily with a meal 10)  Fish Oil 500 Mg Caps (Omega-3 fatty acids) .Marland Kitchen.. 1 by mouth once daily 11)  Cetirizine Hcl 10 Mg Tabs (Cetirizine hcl) .Marland Kitchen.. 1 by mouth once daily as needed allergies 12)  Onetouch Test Strp (Glucose blood) .... Test as directed 13)  Onetouch Lancets Misc (Lancets) .... Use as directed 14)  Singulair 10 Mg Tabs (Montelukast sodium) .Marland Kitchen.. 1po once daily  Patient Instructions: 1)  Please take all new medications as prescribed 2)  Continue all previous medications as before this visit  3)  Please go to the Lab in the basement for your blood and/or urine tests today 4)  Please call the number on the [facility] Card for results of your testing 5)  Please schedule a follow-up appointment in 6 months. Prescriptions: SINGULAIR 10 MG TABS (MONTELUKAST SODIUM) 1po once daily  #90 x 3   Entered and Authorized by:   Corwin Levins MD   Signed by:   Corwin Levins MD on  04/23/2010   Method used:   Print then Give to Patient   RxID:   [id number]    Orders Added: 1)  TLB-BMP (Basic Metabolic  Panel-BMET) [80048-METABOL] 2)  TLB-Lipid Panel [80061-LIPID] 3)  TLB-A1C / Hgb A1C (Glycohemoglobin) [83036-A1C] 4)  Est. Patient Level IV [40102]

## 2010-06-06 ENCOUNTER — Telehealth: Payer: Self-pay | Admitting: Internal Medicine

## 2010-06-10 NOTE — Progress Notes (Signed)
Summary: Rx refill req  Phone Note Call from Patient Call back at Home Phone [phone]   Caller: Patient Call For: Corwin Levins MD Summary of Call: Pt states ne needs, Singulair tablets for his breathing.[facility] telling [facility] that they have contacted office many times with no response. Initial call taken by: Verdell Face,  June 06, 2010 12:49 PM  Follow-up for Phone Call        Pt advised that refill was sent on March 1st Follow-up by: Margaret Pyle, CMA,  June 06, 2010 1:30 PM

## 2010-07-31 ENCOUNTER — Telehealth: Payer: Self-pay | Admitting: *Deleted

## 2010-07-31 NOTE — Telephone Encounter (Signed)
PA requested for Singulair 10mg  tab PA form requested from [facility] @ [phone] [07/30/10] PA form recvd, completed & faxed for Approval [07/30/10] Approval recvd & faxed to pharmacy [07/31/10]

## 2011-01-18 ENCOUNTER — Encounter: Payer: Self-pay | Admitting: Internal Medicine

## 2011-01-18 DIAGNOSIS — Z0001 Encounter for general adult medical examination with abnormal findings: Secondary | ICD-10-CM | POA: Insufficient documentation

## 2011-01-18 DIAGNOSIS — Z0000 Encounter for general adult medical examination without abnormal findings: Secondary | ICD-10-CM | POA: Insufficient documentation

## 2011-01-20 ENCOUNTER — Other Ambulatory Visit (INDEPENDENT_AMBULATORY_CARE_PROVIDER_SITE_OTHER): Payer: Medicare Other

## 2011-01-20 ENCOUNTER — Encounter: Payer: Self-pay | Admitting: Internal Medicine

## 2011-01-20 ENCOUNTER — Ambulatory Visit (INDEPENDENT_AMBULATORY_CARE_PROVIDER_SITE_OTHER): Payer: Medicare Other | Admitting: Internal Medicine

## 2011-01-20 VITALS — BP 104/62 | HR 64 | Temp 98.0°F | Ht 66.0 in | Wt 206.0 lb

## 2011-01-20 DIAGNOSIS — N529 Male erectile dysfunction, unspecified: Secondary | ICD-10-CM

## 2011-01-20 DIAGNOSIS — N320 Bladder-neck obstruction: Secondary | ICD-10-CM

## 2011-01-20 DIAGNOSIS — M109 Gout, unspecified: Secondary | ICD-10-CM

## 2011-01-20 DIAGNOSIS — E785 Hyperlipidemia, unspecified: Secondary | ICD-10-CM

## 2011-01-20 DIAGNOSIS — R5383 Other fatigue: Secondary | ICD-10-CM

## 2011-01-20 DIAGNOSIS — E119 Type 2 diabetes mellitus without complications: Secondary | ICD-10-CM

## 2011-01-20 DIAGNOSIS — I10 Essential (primary) hypertension: Secondary | ICD-10-CM

## 2011-01-20 DIAGNOSIS — R5381 Other malaise: Secondary | ICD-10-CM

## 2011-01-20 LAB — CBC WITH DIFFERENTIAL/PLATELET
Basophils Absolute: 0 10*3/uL (ref 0.0–0.1)
Basophils Relative: 0.7 % (ref 0.0–3.0)
Eosinophils Absolute: 0.1 10*3/uL (ref 0.0–0.7)
Eosinophils Relative: 3.2 % (ref 0.0–5.0)
HCT: 43 % (ref 39.0–52.0)
Hemoglobin: 14 g/dL (ref 13.0–17.0)
Lymphocytes Relative: 41.7 % (ref 12.0–46.0)
Lymphs Abs: 1.7 10*3/uL (ref 0.7–4.0)
MCHC: 32.6 g/dL (ref 30.0–36.0)
MCV: 94.3 fl (ref 78.0–100.0)
Monocytes Absolute: 0.4 10*3/uL (ref 0.1–1.0)
Monocytes Relative: 9.7 % (ref 3.0–12.0)
Neutro Abs: 1.8 10*3/uL (ref 1.4–7.7)
Neutrophils Relative %: 44.7 % (ref 43.0–77.0)
Platelets: 200 10*3/uL (ref 150.0–400.0)
RBC: 4.56 Mil/uL (ref 4.22–5.81)
RDW: 15.2 % — ABNORMAL HIGH (ref 11.5–14.6)
WBC: 4.1 10*3/uL — ABNORMAL LOW (ref 4.5–10.5)

## 2011-01-20 LAB — MICROALBUMIN / CREATININE URINE RATIO
Creatinine,U: 78.1 mg/dL
Microalb Creat Ratio: 0.5 mg/g (ref 0.0–30.0)
Microalb, Ur: 0.4 mg/dL (ref 0.0–1.9)

## 2011-01-20 LAB — URINALYSIS, ROUTINE W REFLEX MICROSCOPIC
Bilirubin Urine: NEGATIVE
Hgb urine dipstick: NEGATIVE
Ketones, ur: NEGATIVE
Leukocytes, UA: NEGATIVE
Nitrite: NEGATIVE
Specific Gravity, Urine: 1.01 (ref 1.000–1.030)
Total Protein, Urine: NEGATIVE
Urine Glucose: NEGATIVE
Urobilinogen, UA: 0.2 (ref 0.0–1.0)
pH: 6.5 (ref 5.0–8.0)

## 2011-01-20 LAB — URIC ACID: Uric Acid, Serum: 4.6 mg/dL (ref 4.0–7.8)

## 2011-01-20 LAB — BASIC METABOLIC PANEL
BUN: 14 mg/dL (ref 6–23)
CO2: 32 mEq/L (ref 19–32)
Calcium: 9.5 mg/dL (ref 8.4–10.5)
Chloride: 104 mEq/L (ref 96–112)
Creatinine, Ser: 1.1 mg/dL (ref 0.4–1.5)
GFR: 88.25 mL/min (ref >60.00)
Glucose, Bld: 81 mg/dL (ref 70–99)
Potassium: 5 mEq/L (ref 3.5–5.1)
Sodium: 142 mEq/L (ref 135–145)

## 2011-01-20 LAB — HEPATIC FUNCTION PANEL
ALT: 26 U/L (ref 0–53)
AST: 29 U/L (ref 0–37)
Albumin: 4.3 g/dL (ref 3.5–5.2)
Alkaline Phosphatase: 56 U/L (ref 39–117)
Bilirubin, Direct: 0.1 mg/dL (ref 0.0–0.3)
Total Bilirubin: 0.8 mg/dL (ref 0.3–1.2)
Total Protein: 6.7 g/dL (ref 6.0–8.3)

## 2011-01-20 LAB — LIPID PANEL
Cholesterol: 142 mg/dL (ref 0–200)
HDL: 58.4 mg/dL (ref >39.00)
LDL Cholesterol: 77 mg/dL (ref 0–99)
Total CHOL/HDL Ratio: 2
Triglycerides: 33 mg/dL (ref 0.0–149.0)
VLDL: 6.6 mg/dL (ref 0.0–40.0)

## 2011-01-20 LAB — HEMOGLOBIN A1C: Hgb A1c MFr Bld: 6.3 % (ref 4.6–6.5)

## 2011-01-20 LAB — PSA: PSA: 3.52 ng/mL (ref 0.10–4.00)

## 2011-01-20 LAB — TSH: TSH: 2.04 u[IU]/mL (ref 0.35–5.50)

## 2011-01-20 MED ORDER — MONTELUKAST SODIUM 10 MG PO TABS: 10.0000 mg | ORAL_TABLET | Freq: Every day | ORAL | Status: DC

## 2011-01-20 MED ORDER — ALBUTEROL SULFATE HFA 108 (90 BASE) MCG/ACT IN AERS: 2.0000 | INHALATION_SPRAY | Freq: Four times a day (QID) | RESPIRATORY_TRACT | Status: DC

## 2011-01-20 MED ORDER — AMLODIPINE BESY-BENAZEPRIL HCL 5-20 MG PO CAPS: 1.0000 | ORAL_CAPSULE | Freq: Every day | ORAL | Status: DC

## 2011-01-20 MED ORDER — BUDESONIDE-FORMOTEROL FUMARATE 160-4.5 MCG/ACT IN AERO: 2.0000 | INHALATION_SPRAY | Freq: Two times a day (BID) | RESPIRATORY_TRACT | Status: DC

## 2011-01-20 MED ORDER — GLUCOSE BLOOD VI STRP: ORAL_STRIP | Status: DC

## 2011-01-20 MED ORDER — ALFUZOSIN HCL ER 10 MG PO TB24: 10.0000 mg | ORAL_TABLET | Freq: Every day | ORAL | Status: DC

## 2011-01-20 MED ORDER — ATORVASTATIN CALCIUM 10 MG PO TABS: 10.0000 mg | ORAL_TABLET | Freq: Every day | ORAL | Status: DC

## 2011-01-20 MED ORDER — ONETOUCH LANCETS MISC: 1.0000 "application " | Freq: Every day | Status: DC

## 2011-01-20 MED ORDER — ALLOPURINOL 300 MG PO TABS: 300.0000 mg | ORAL_TABLET | Freq: Every day | ORAL | Status: DC

## 2011-01-20 NOTE — Progress Notes (Signed)
Subjective:    Patient ID: Allen Acre., male    DOB: Dec 12, 1940, 70 y.o.   MRN: [id number]  HPI  Here to f/u; overall doing ok,  Pt denies chest pain, increased sob or doe, wheezing, orthopnea, PND, increased LE swelling, palpitations, dizziness or syncope.  Pt denies new neurological symptoms such as new headache, or facial or extremity weakness or numbness   Pt denies polydipsia, polyuria, or low sugar symptoms such as weakness or confusion improved with po intake.  Pt states overall good compliance with meds, trying to follow lower cholesterol, diabetic diet, wt overall stable but little exercise however  Goes to gym 6 days per wk, gained 10 lbs, thinks some may be muscle since he has been working with 2 trainers.  Has had some worsening ED symptoms, mild for over 6 months and asks for testosterone check.  Has some hemorrhoidal problem lately he thinks as before with discomfort, no blood.  Needs mult refills to [facility].  Does have sense of ongoing fatigue, but denies signficant hypersomnolence.  No recent gout attacks or similar joint pain.  Overall good compliance with treatment, and good medicine tolerability. Past Medical History  Diagnosis Date  . ALLERGIC RHINITIS 10/04/2008  . ASTHMA 10/04/2008  . BACK PAIN 04/18/2008  . BENIGN PROSTATIC HYPERTROPHY 03/09/2007  . COLONIC POLYPS, HX OF 03/09/2007  . COPD 10/20/2007  . DIABETES MELLITUS, TYPE II 03/09/2007  . FATIGUE 10/05/2007  . GOUT 10/19/2006  . HYPERLIPIDEMIA 10/19/2006  . HYPERTENSION 10/19/2006  . SWELLING MASS OR LUMP IN HEAD AND NECK 01/09/2010   Past Surgical History  Procedure Date  . Appendectomy     reports that he has quit smoking. He does not have any smokeless tobacco history on file. He reports that he does not drink alcohol. His drug history not on file. family history includes Cancer in his other and Hypertension in his mother. No Known Allergies Current Outpatient Prescriptions on File Prior to Visit  Medication Sig  Dispense Refill  . aspirin 81 MG tablet Take 81 mg by mouth daily.        . Omega-3 Fatty Acids (FISH OIL) 500 MG CAPS Take by mouth daily.        . vardenafil (LEVITRA) 20 MG tablet 1 by mouth every other day as needed        Review of Systems Review of Systems  Constitutional: Negative for diaphoresis and unexpected weight change.  HENT: Negative for drooling and tinnitus.   Eyes: Negative for photophobia and visual disturbance.  Respiratory: Negative for choking and stridor.   Gastrointestinal: Negative for vomiting and blood in stool.  Genitourinary: Negative for hematuria and decreased urine volume.  Musculoskeletal: Negative for gait problem. .      Objective:   Physical Exam BP 104/62  Pulse 64  Temp(Src) 98 F (36.7 C) (Oral)  Ht 5\' 6"  (1.676 m)  Wt 206 lb (93.441 kg)  BMI 33.25 kg/m2  SpO2 95% Physical Exam  VS noted, not ill appearing, morbid obese Constitutional: Pt appears well-developed and well-nourished.  HENT: Head: Normocephalic.  Right Ear: External ear normal.  Left Ear: External ear normal.  Eyes: Conjunctivae and EOM are normal. Pupils are equal, round, and reactive to light.  Neck: Normal range of motion. Neck supple.  Cardiovascular: Normal rate and regular rhythm.   Pulmonary/Chest: Effort normal and breath sounds normal.  Abd:  Soft, NT, non-distended, + BS Neurological: Pt is alert. No cranial nerve deficit.  Skin: Skin is  warm. No erythema.  Psychiatric: Pt behavior is normal. Thought content normal.     Assessment & Plan:

## 2011-01-20 NOTE — Patient Instructions (Signed)
Continue all other medications as before Please go to LAB in the Basement for the blood and/or urine tests to be done today Please call the phone number [phone] (the [employer] System) for results of testing in 2-3 days;  When calling, simply dial the number, and when prompted enter the MRN number above (the Medical Record Number) and the # key, then the message should start. Your medications were all refilled as discussed

## 2011-01-21 ENCOUNTER — Other Ambulatory Visit: Payer: Self-pay | Admitting: Internal Medicine

## 2011-01-21 DIAGNOSIS — R972 Elevated prostate specific antigen [PSA]: Secondary | ICD-10-CM

## 2011-01-21 LAB — TESTOSTERONE, FREE, TOTAL, SHBG
Sex Hormone Binding: 64 nmol/L (ref 13–71)
Testosterone, Free: 57.4 pg/mL (ref 47.0–244.0)
Testosterone-% Free: 1.3 % — ABNORMAL LOW (ref 1.6–2.9)
Testosterone: 440.91 ng/dL (ref 250–890)

## 2011-01-22 ENCOUNTER — Encounter: Payer: Self-pay | Admitting: Internal Medicine

## 2011-01-22 MED ORDER — HYDROCORTISONE ACETATE 25 MG RE SUPP: 25.0000 mg | Freq: Two times a day (BID) | RECTAL | Status: DC

## 2011-01-22 NOTE — Assessment & Plan Note (Signed)
Also for psa and UA as he is due,  to f/u any worsening symptoms or concerns, asympt

## 2011-01-22 NOTE — Assessment & Plan Note (Signed)
stable overall by hx and exam, most recent data reviewed with pt, and pt to continue medical treatment as before 

## 2011-01-22 NOTE — Assessment & Plan Note (Signed)
stable overall by hx and exam, most recent data reviewed with pt, and pt to continue medical treatment as before, for uric acid level

## 2011-01-22 NOTE — Assessment & Plan Note (Signed)
stable overall by hx and exam, most recent data reviewed with pt, and pt to continue medical treatment as before  BP Readings from Last 3 Encounters:  01/20/11 104/62  04/23/10 102/62  01/09/10 120/62

## 2011-01-22 NOTE — Assessment & Plan Note (Signed)
Etiology unclear, Exam otherwise benign, to check labs as documented, follow with expectant management  

## 2011-01-22 NOTE — Assessment & Plan Note (Signed)
For levitra prn,  to f/u any worsening symptoms or concerns  

## 2011-01-22 NOTE — Assessment & Plan Note (Signed)
stable overall by hx and exam, most recent data reviewed with pt, and pt to continue medical treatment as before  Lab Results  Component Value Date   HGBA1C 6.3 01/20/2011

## 2011-02-09 ENCOUNTER — Telehealth: Payer: Self-pay

## 2011-02-09 NOTE — Telephone Encounter (Signed)
The patient had recent Biopsy and would like those results, call back number is [phone]

## 2011-02-09 NOTE — Telephone Encounter (Signed)
I was not the attending MD for this and not sure what biopsy this pertains to;  Ideally the MD responsible for the biopsy is responsible for interpretation and notification of results, so I would defer to that MD

## 2011-02-09 NOTE — Telephone Encounter (Signed)
Called and informed the patient of MD's instructions. He informed he would schedule appointment with JWJ as he would like to change urologist and would like to discuss all with JWJ.

## 2011-02-11 ENCOUNTER — Encounter: Payer: Self-pay | Admitting: Internal Medicine

## 2011-02-11 ENCOUNTER — Ambulatory Visit (INDEPENDENT_AMBULATORY_CARE_PROVIDER_SITE_OTHER): Payer: Medicare Other | Admitting: Internal Medicine

## 2011-02-11 VITALS — BP 104/58 | HR 73 | Temp 98.0°F | Ht 65.0 in | Wt 206.5 lb

## 2011-02-11 DIAGNOSIS — E119 Type 2 diabetes mellitus without complications: Secondary | ICD-10-CM

## 2011-02-11 DIAGNOSIS — E785 Hyperlipidemia, unspecified: Secondary | ICD-10-CM

## 2011-02-11 DIAGNOSIS — R897 Abnormal histological findings in specimens from other organs, systems and tissues: Secondary | ICD-10-CM | POA: Insufficient documentation

## 2011-02-11 DIAGNOSIS — I10 Essential (primary) hypertension: Secondary | ICD-10-CM

## 2011-02-11 MED ORDER — GLUCOSE BLOOD VI STRP: ORAL_STRIP | Status: DC

## 2011-02-11 NOTE — Patient Instructions (Addendum)
Continue all other medications as before [facility]

## 2011-02-15 ENCOUNTER — Encounter: Payer: Self-pay | Admitting: Internal Medicine

## 2011-02-15 NOTE — Assessment & Plan Note (Signed)
stable overall by hx and exam, most recent data reviewed with pt, and pt to continue medical treatment as before Lab Results  Component Value Date   LDLCALC 77 01/20/2011

## 2011-02-15 NOTE — Assessment & Plan Note (Signed)
stable overall by hx and exam, most recent data reviewed with pt, and pt to continue medical treatment as before  BP Readings from Last 3 Encounters:  02/11/11 104/58  01/20/11 104/62  04/23/10 102/62

## 2011-02-15 NOTE — Assessment & Plan Note (Signed)
D/w pt regarding abnormal/atypical cell biopsy, recently per [employer];  Ok to cont the soy/fish oil/finasteride as per this urologist;  Pt still wishes second opinion with [place] u[facility] - will defer to pt request

## 2011-02-15 NOTE — Assessment & Plan Note (Signed)
stable overall by hx and exam, most recent data reviewed with pt, and pt to continue medical treatment as before  Lab Results  Component Value Date   HGBA1C 6.3 01/20/2011    

## 2011-02-15 NOTE — Progress Notes (Signed)
Subjective:    Patient ID: Allen Hunter., male    DOB: June 12, 1940, 70 y.o.   MRN: [id number]  HPI  Here to f/u; overall doing ok,  Pt denies chest pain, increased sob or doe, wheezing, orthopnea, PND, increased LE swelling, palpitations, dizziness or syncope.  Pt denies new neurological symptoms such as new headache, or facial or extremity weakness or numbness   Pt denies polydipsia, polyuria, or low sugar symptoms such as weakness or confusion improved with po intake.  Pt states overall good compliance with meds, trying to follow lower cholesterol, diabetic diet, wt overall stable but little exercise however. Did have recent abnormal but nonmalignant biopsy per urologist in [place], and pt concerned about recommendation for soy/fish oil/finasteride and watchful waiting. Past Medical History  Diagnosis Date  . ALLERGIC RHINITIS 10/04/2008  . ASTHMA 10/04/2008  . BACK PAIN 04/18/2008  . BENIGN PROSTATIC HYPERTROPHY 03/09/2007  . COLONIC POLYPS, HX OF 03/09/2007  . COPD 10/20/2007  . DIABETES MELLITUS, TYPE II 03/09/2007  . FATIGUE 10/05/2007  . GOUT 10/19/2006  . HYPERLIPIDEMIA 10/19/2006  . HYPERTENSION 10/19/2006  . SWELLING MASS OR LUMP IN HEAD AND NECK 01/09/2010   Past Surgical History  Procedure Date  . Appendectomy     reports that he has quit smoking. He does not have any smokeless tobacco history on file. He reports that he does not drink alcohol. His drug history not on file. family history includes Cancer in his other and Hypertension in his mother. No Known Allergies Current Outpatient Prescriptions on File Prior to Visit  Medication Sig Dispense Refill  . albuterol (PROVENTIL HFA;VENTOLIN HFA) 108 (90 BASE) MCG/ACT inhaler Inhale 2 puffs into the lungs 4 (four) times daily.  3 Inhaler  3  . alfuzosin (UROXATRAL) 10 MG 24 hr tablet Take 1 tablet (10 mg total) by mouth daily.  90 tablet  3  . allopurinol (ZYLOPRIM) 300 MG tablet Take 1 tablet (300 mg total) by mouth daily.  90  tablet  3  . amLODipine-benazepril (LOTREL) 5-20 MG per capsule Take 1 capsule by mouth daily.  90 capsule  3  . aspirin 81 MG tablet Take 81 mg by mouth daily.        Marland Kitchen atorvastatin (LIPITOR) 10 MG tablet Take 1 tablet (10 mg total) by mouth daily.  90 tablet  3  . budesonide-formoterol (SYMBICORT) 160-4.5 MCG/ACT inhaler Inhale 2 puffs into the lungs 2 (two) times daily.  3 Inhaler  3  . glucose blood (ONE TOUCH TEST STRIPS) test strip Use as directed  100 each  3  . montelukast (SINGULAIR) 10 MG tablet Take 1 tablet (10 mg total) by mouth at bedtime.  90 tablet  3  . ONE TOUCH LANCETS MISC Apply 1 application topically daily. Use as directed  100 each  3  . vardenafil (LEVITRA) 20 MG tablet 1 by mouth every other day as needed       . Omega-3 Fatty Acids (FISH OIL) 500 MG CAPS Take by mouth daily.         Review of Systems Review of Systems  Constitutional: Negative for diaphoresis and unexpected weight change.  HENT: Negative for drooling and tinnitus.   Eyes: Negative for photophobia and visual disturbance.  Respiratory: Negative for choking and stridor.   Gastrointestinal: Negative for vomiting and blood in stool.  Genitourinary: Negative for hematuria and decreased urine volume.        Objective:   Physical Exam BP 104/58  Pulse 73  Temp(Src) 98 F (36.7 C) (Oral)  Ht 5\' 5"  (1.651 m)  Wt 206 lb 8 oz (93.668 kg)  BMI 34.36 kg/m2  SpO2 96% Physical Exam  VS noted Constitutional: Pt appears well-developed and well-nourished.  HENT: Head: Normocephalic.  Right Ear: External ear normal.  Left Ear: External ear normal.  Eyes: Conjunctivae and EOM are normal. Pupils are equal, round, and reactive to light.  Neck: Normal range of motion. Neck supple.  Cardiovascular: Normal rate and regular rhythm.   Pulmonary/Chest: Effort normal and breath sounds normal.  Abd:  Soft, NT, non-distended, + BS Neurological: Pt is alert. No cranial nerve deficit.  Skin: Skin is warm. No  erythema.  Psychiatric: Pt behavior is normal. Thought content normal.     Assessment & Plan:

## 2011-02-27 ENCOUNTER — Other Ambulatory Visit: Payer: Self-pay

## 2011-02-27 MED ORDER — GLUCOSE BLOOD VI STRP: ORAL_STRIP | Status: AC

## 2011-04-03 DIAGNOSIS — R972 Elevated prostate specific antigen [PSA]: Secondary | ICD-10-CM | POA: Diagnosis not present

## 2011-06-11 DIAGNOSIS — B370 Candidal stomatitis: Secondary | ICD-10-CM | POA: Diagnosis not present

## 2011-06-24 DIAGNOSIS — B370 Candidal stomatitis: Secondary | ICD-10-CM | POA: Diagnosis not present

## 2011-07-16 DIAGNOSIS — B370 Candidal stomatitis: Secondary | ICD-10-CM | POA: Diagnosis not present

## 2011-07-20 ENCOUNTER — Ambulatory Visit (INDEPENDENT_AMBULATORY_CARE_PROVIDER_SITE_OTHER): Payer: Medicare Other | Admitting: Internal Medicine

## 2011-07-20 ENCOUNTER — Encounter: Payer: Self-pay | Admitting: Internal Medicine

## 2011-07-20 VITALS — BP 98/58 | HR 72 | Temp 97.4°F | Ht 65.0 in | Wt 207.4 lb

## 2011-07-20 DIAGNOSIS — E119 Type 2 diabetes mellitus without complications: Secondary | ICD-10-CM | POA: Diagnosis not present

## 2011-07-20 DIAGNOSIS — N529 Male erectile dysfunction, unspecified: Secondary | ICD-10-CM | POA: Diagnosis not present

## 2011-07-20 DIAGNOSIS — Q383 Other congenital malformations of tongue: Secondary | ICD-10-CM | POA: Diagnosis not present

## 2011-07-20 DIAGNOSIS — R972 Elevated prostate specific antigen [PSA]: Secondary | ICD-10-CM | POA: Diagnosis not present

## 2011-07-20 DIAGNOSIS — I10 Essential (primary) hypertension: Secondary | ICD-10-CM

## 2011-07-20 MED ORDER — SILDENAFIL CITRATE 100 MG PO TABS: 50.0000 mg | ORAL_TABLET | Freq: Every day | ORAL | Status: DC | PRN

## 2011-07-20 NOTE — Assessment & Plan Note (Signed)
stable overall by hx and exam, most recent data reviewed with pt, and pt to continue medical treatment as before ble  BP Readings from Last 3 Encounters:  07/20/11 98/58  02/11/11 104/58  01/20/11 104/62

## 2011-07-20 NOTE — Patient Instructions (Signed)
Continue all other medications as before, including the mouth wash per ENT. Your refill was done today as requested Please have the pharmacy call with any refills you may need. Please continue your efforts at being more active, low cholesterol diet, and weight control. Please return in 6 months, or sooner if needed

## 2011-07-20 NOTE — Assessment & Plan Note (Signed)
Overall benign appearance, for cont;d mouthwash prn

## 2011-07-20 NOTE — Assessment & Plan Note (Signed)
Has f/u with urology in July 2013 with f/u psa and possible repeat biopsy, pt to f./u with Dr Laverle Patter

## 2011-07-20 NOTE — Assessment & Plan Note (Signed)
stable overall by hx and exam, most recent data reviewed with pt, and pt to continue medical treatment as before  [facility]

## 2011-07-20 NOTE — Assessment & Plan Note (Signed)
stable overall by hx and exam, most recent data reviewed with pt, and pt to continue medical treatment as before Lab Results  Component Value Date   HGBA1C 6.3 01/20/2011   To cont diet control, work on wt loss and diet

## 2011-07-20 NOTE — Progress Notes (Signed)
Subjective:    Patient ID: Allen Hunter., male    DOB: 08-23-1940, 71 y.o.   MRN: [id number]  HPI  Here to f/u; overall doing well, Pt denies chest pain, increased sob or doe, wheezing, orthopnea, PND, increased LE swelling, palpitations, dizziness or syncope.  Pt denies new neurological symptoms such as new headache, or facial or extremity weakness or numbness   Pt denies polydipsia, polyuria.  Pt states overall good compliance with meds, trying to follow lower cholesterol, diabetic diet, wt overall stable but little exercise however.  S/p prior prostate bx per Dr Sheppard Penton in [place] with atypical cells o/w neg,.  Did see urology recently with increased PSA to 3.5, felt overall stable, no further biopsy needed, but for f/u appt with Dr Princess Bruins urology with repeat psa, with consideration and leaning towards repeat biopsy possibly at that time - July 2013.   Pt denies fever, wt loss, night sweats, loss of appetite, or other constitutional symptoms  Overall good compliance with treatment, and good medicine tolerability.  [facility].  Does have resolving discomfort and irriation to post tongue with papillae enlarged, wondering about cancer, gave up chewing tobacco, has already seen ENT x 2 and tx with mouthwash. Past Medical History  Diagnosis Date  . ALLERGIC RHINITIS 10/04/2008  . ASTHMA 10/04/2008  . BACK PAIN 04/18/2008  . BENIGN PROSTATIC HYPERTROPHY 03/09/2007  . COLONIC POLYPS, HX OF 03/09/2007  . COPD 10/20/2007  . DIABETES MELLITUS, TYPE II 03/09/2007  . FATIGUE 10/05/2007  . GOUT 10/19/2006  . HYPERLIPIDEMIA 10/19/2006  . HYPERTENSION 10/19/2006  . SWELLING MASS OR LUMP IN HEAD AND NECK 01/09/2010   Past Surgical History  Procedure Date  . Appendectomy     reports that he has quit smoking. He does not have any smokeless tobacco history on file. He reports that he does not drink alcohol. His drug history not on file. family history includes Cancer in his  other and Hypertension in his mother. No Known Allergies Current Outpatient Prescriptions on File Prior to Visit  Medication Sig Dispense Refill  . albuterol (PROVENTIL HFA;VENTOLIN HFA) 108 (90 BASE) MCG/ACT inhaler Inhale 2 puffs into the lungs 4 (four) times daily.  3 Inhaler  3  . alfuzosin (UROXATRAL) 10 MG 24 hr tablet Take 1 tablet (10 mg total) by mouth daily.  90 tablet  3  . allopurinol (ZYLOPRIM) 300 MG tablet Take 1 tablet (300 mg total) by mouth daily.  90 tablet  3  . amLODipine-benazepril (LOTREL) 5-20 MG per capsule Take 1 capsule by mouth daily.  90 capsule  3  . aspirin 81 MG tablet Take 81 mg by mouth daily.        Marland Kitchen atorvastatin (LIPITOR) 10 MG tablet Take 1 tablet (10 mg total) by mouth daily.  90 tablet  3  . budesonide-formoterol (SYMBICORT) 160-4.5 MCG/ACT inhaler Inhale 2 puffs into the lungs 2 (two) times daily.  3 Inhaler  3  . glucose blood (ONE TOUCH TEST STRIPS) test strip Use as directed  100 each  3  . glucose blood (ONE TOUCH ULTRA TEST) test strip Use as directed 1 per day  100 each  5  . montelukast (SINGULAIR) 10 MG tablet Take 1 tablet (10 mg total) by mouth at bedtime.  90 tablet  3  . Omega-3 Fatty Acids (FISH OIL) 500 MG CAPS Take by mouth daily.        . ONE TOUCH LANCETS MISC Apply 1 application  topically daily. Use as directed  100 each  3  . vardenafil (LEVITRA) 20 MG tablet 1 by mouth every other day as needed       . sildenafil (VIAGRA) 100 MG tablet Take 0.5-1 tablets (50-100 mg total) by mouth daily as needed for erectile dysfunction.  18 tablet  3   Review of Systems Review of Systems  Constitutional: Negative for diaphoresis and unexpected weight change.  HNT: Negative for drooling and tinnitus.   Eyes: Negative for photophobia and visual disturbance.  Respiratory: Negative for choking and stridor.   Gastrointestinal: Negative for vomiting and blood in stool.  Genitourinary: Negative for hematuria and decreased urine volume.    Musculoskeletal: Negative for gait problem.  Skin: Negative for color change and wound.  Neurological: Negative for tremors and numbness.  Psychiatric/Behavioral: Negative for decreased concentration. The patient is not hyperactive.       Objective:   Physical Exam BP 98/58  Pulse 72  Temp(Src) 97.4 F (36.3 C) (Oral)  Ht 5\' 5"  (1.651 m)  Wt 207 lb 6 oz (94.065 kg)  BMI 34.51 kg/m2  SpO2 96% Physical Exam  VS noted., not ill appearing Constitutional: Pt appears well-developed and well-nourished.  HENT: Head: Normocephalic.  Right Ear: External ear normal.  Left Ear: External ear normal.  Mouth; tongue with mild post tongue enlarged papillae/slight coating, no mass or ulcer Eyes: Conjunctivae and EOM are normal. Pupils are equal, round, and reactive to light.  Neck: Normal range of motion. Neck supple. No mass  Cardiovascular: Normal rate and regular rhythm.   Pulmonary/Chest: Effort normal and breath sounds normal.  Neurological: Pt is alert. Not confused Skin: Skin is warm. No erythema.  Psychiatric: Pt behavior is normal. Thought content normal.     Assessment & Plan:

## 2011-07-21 ENCOUNTER — Ambulatory Visit: Payer: Medicare Other | Admitting: Internal Medicine

## 2011-07-27 ENCOUNTER — Telehealth: Payer: Self-pay

## 2011-07-27 MED ORDER — INDOMETHACIN 50 MG PO CAPS: 50.0000 mg | ORAL_CAPSULE | Freq: Three times a day (TID) | ORAL | Status: AC

## 2011-07-27 NOTE — Telephone Encounter (Signed)
Done per emr 

## 2011-07-27 NOTE — Telephone Encounter (Signed)
[facility] refill on Indomethacin 50 mg. Please advise as was discontinued on 10/12.

## 2011-09-21 ENCOUNTER — Other Ambulatory Visit: Payer: Self-pay

## 2011-09-21 MED ORDER — MONTELUKAST SODIUM 10 MG PO TABS: 10.0000 mg | ORAL_TABLET | Freq: Every day | ORAL | Status: DC

## 2011-10-01 DIAGNOSIS — C4401 Basal cell carcinoma of skin of lip: Secondary | ICD-10-CM | POA: Diagnosis not present

## 2011-10-01 DIAGNOSIS — L821 Other seborrheic keratosis: Secondary | ICD-10-CM | POA: Diagnosis not present

## 2011-10-01 DIAGNOSIS — D485 Neoplasm of uncertain behavior of skin: Secondary | ICD-10-CM | POA: Diagnosis not present

## 2011-10-01 DIAGNOSIS — L819 Disorder of pigmentation, unspecified: Secondary | ICD-10-CM | POA: Diagnosis not present

## 2011-10-13 DIAGNOSIS — C4401 Basal cell carcinoma of skin of lip: Secondary | ICD-10-CM | POA: Diagnosis not present

## 2011-10-16 ENCOUNTER — Other Ambulatory Visit: Payer: Self-pay

## 2011-10-16 DIAGNOSIS — R972 Elevated prostate specific antigen [PSA]: Secondary | ICD-10-CM | POA: Diagnosis not present

## 2011-10-16 MED ORDER — HYDROCORTISONE ACETATE 25 MG RE SUPP: 25.0000 mg | Freq: Two times a day (BID) | RECTAL | Status: AC

## 2011-11-17 DIAGNOSIS — C4401 Basal cell carcinoma of skin of lip: Secondary | ICD-10-CM | POA: Diagnosis not present

## 2011-11-17 DIAGNOSIS — L905 Scar conditions and fibrosis of skin: Secondary | ICD-10-CM | POA: Diagnosis not present

## 2011-12-28 DIAGNOSIS — B370 Candidal stomatitis: Secondary | ICD-10-CM | POA: Diagnosis not present

## 2012-01-07 DIAGNOSIS — R972 Elevated prostate specific antigen [PSA]: Secondary | ICD-10-CM | POA: Diagnosis not present

## 2012-01-18 DIAGNOSIS — G479 Sleep disorder, unspecified: Secondary | ICD-10-CM | POA: Diagnosis not present

## 2012-01-18 DIAGNOSIS — B370 Candidal stomatitis: Secondary | ICD-10-CM | POA: Diagnosis not present

## 2012-01-19 ENCOUNTER — Encounter: Payer: Self-pay | Admitting: Internal Medicine

## 2012-01-19 ENCOUNTER — Other Ambulatory Visit (INDEPENDENT_AMBULATORY_CARE_PROVIDER_SITE_OTHER): Payer: Medicare Other

## 2012-01-19 ENCOUNTER — Ambulatory Visit (INDEPENDENT_AMBULATORY_CARE_PROVIDER_SITE_OTHER): Payer: Medicare Other | Admitting: Internal Medicine

## 2012-01-19 VITALS — BP 120/80 | HR 62 | Temp 97.8°F | Ht 65.0 in | Wt 189.0 lb

## 2012-01-19 DIAGNOSIS — E119 Type 2 diabetes mellitus without complications: Secondary | ICD-10-CM | POA: Diagnosis not present

## 2012-01-19 DIAGNOSIS — Q383 Other congenital malformations of tongue: Secondary | ICD-10-CM

## 2012-01-19 DIAGNOSIS — I10 Essential (primary) hypertension: Secondary | ICD-10-CM

## 2012-01-19 DIAGNOSIS — G4710 Hypersomnia, unspecified: Secondary | ICD-10-CM | POA: Insufficient documentation

## 2012-01-19 DIAGNOSIS — C61 Malignant neoplasm of prostate: Secondary | ICD-10-CM

## 2012-01-19 DIAGNOSIS — E785 Hyperlipidemia, unspecified: Secondary | ICD-10-CM | POA: Diagnosis not present

## 2012-01-19 HISTORY — DX: Malignant neoplasm of prostate: C61

## 2012-01-19 LAB — URINALYSIS, ROUTINE W REFLEX MICROSCOPIC
Bilirubin Urine: NEGATIVE
Hgb urine dipstick: NEGATIVE
Ketones, ur: NEGATIVE
Leukocytes, UA: NEGATIVE
Nitrite: NEGATIVE
Specific Gravity, Urine: <=1.005 (ref 1.000–1.030)
Total Protein, Urine: NEGATIVE
Urine Glucose: NEGATIVE
Urobilinogen, UA: 0.2 (ref 0.0–1.0)
WBC, UA: NONE SEEN (ref 0–?)
pH: 7 (ref 5.0–8.0)

## 2012-01-19 LAB — LIPID PANEL
Cholesterol: 135 mg/dL (ref 0–200)
HDL: 51.6 mg/dL (ref >39.00)
LDL Cholesterol: 75 mg/dL (ref 0–99)
Total CHOL/HDL Ratio: 3
Triglycerides: 40 mg/dL (ref 0.0–149.0)
VLDL: 8 mg/dL (ref 0.0–40.0)

## 2012-01-19 LAB — BASIC METABOLIC PANEL
BUN: 19 mg/dL (ref 6–23)
CO2: 31 mEq/L (ref 19–32)
Calcium: 8.9 mg/dL (ref 8.4–10.5)
Chloride: 102 mEq/L (ref 96–112)
Creatinine, Ser: 0.8 mg/dL (ref 0.4–1.5)
GFR: 120.04 mL/min (ref >60.00)
Glucose, Bld: 85 mg/dL (ref 70–99)
Potassium: 4.4 mEq/L (ref 3.5–5.1)
Sodium: 138 mEq/L (ref 135–145)

## 2012-01-19 LAB — CBC WITH DIFFERENTIAL/PLATELET
Basophils Absolute: 0 10*3/uL (ref 0.0–0.1)
Basophils Relative: 0.6 % (ref 0.0–3.0)
Eosinophils Absolute: 0.1 10*3/uL (ref 0.0–0.7)
Eosinophils Relative: 3.3 % (ref 0.0–5.0)
HCT: 41.3 % (ref 39.0–52.0)
Hemoglobin: 13.5 g/dL (ref 13.0–17.0)
Lymphocytes Relative: 35.7 % (ref 12.0–46.0)
Lymphs Abs: 1.6 10*3/uL (ref 0.7–4.0)
MCHC: 32.7 g/dL (ref 30.0–36.0)
MCV: 92.7 fl (ref 78.0–100.0)
Monocytes Absolute: 0.4 10*3/uL (ref 0.1–1.0)
Monocytes Relative: 9.3 % (ref 3.0–12.0)
Neutro Abs: 2.3 10*3/uL (ref 1.4–7.7)
Neutrophils Relative %: 51.1 % (ref 43.0–77.0)
Platelets: 196 10*3/uL (ref 150.0–400.0)
RBC: 4.45 Mil/uL (ref 4.22–5.81)
RDW: 14.8 % — ABNORMAL HIGH (ref 11.5–14.6)
WBC: 4.4 10*3/uL — ABNORMAL LOW (ref 4.5–10.5)

## 2012-01-19 LAB — MICROALBUMIN / CREATININE URINE RATIO
Creatinine,U: 54.6 mg/dL
Microalb Creat Ratio: 0.4 mg/g (ref 0.0–30.0)
Microalb, Ur: 0.2 mg/dL (ref 0.0–1.9)

## 2012-01-19 LAB — HEPATIC FUNCTION PANEL
ALT: 20 U/L (ref 0–53)
AST: 24 U/L (ref 0–37)
Albumin: 3.9 g/dL (ref 3.5–5.2)
Alkaline Phosphatase: 60 U/L (ref 39–117)
Bilirubin, Direct: 0 mg/dL (ref 0.0–0.3)
Total Bilirubin: 0.6 mg/dL (ref 0.3–1.2)
Total Protein: 6.6 g/dL (ref 6.0–8.3)

## 2012-01-19 LAB — HEMOGLOBIN A1C: Hgb A1c MFr Bld: 6.1 % (ref 4.6–6.5)

## 2012-01-19 LAB — TSH: TSH: 0.38 u[IU]/mL (ref 0.35–5.50)

## 2012-01-19 MED ORDER — NYSTATIN 100000 UNIT/ML MT SUSP: 500000.0000 [IU] | Freq: Four times a day (QID) | OROMUCOSAL | Status: DC

## 2012-01-19 NOTE — Assessment & Plan Note (Signed)
Ok to cont the nystatin as per ent, reassured

## 2012-01-19 NOTE — Assessment & Plan Note (Signed)
stable overall by hx and exam, most recent data reviewed with pt, and pt to continue medical treatment as before Lab Results  Component Value Date   LDLCALC 77 01/20/2011    

## 2012-01-19 NOTE — Progress Notes (Signed)
Subjective:    Patient ID: Allen Acre., male    DOB: 1940-11-19, 71 y.o.   MRN: [id number]  HPI  Here for f/u;  Overall doing ok;  Pt denies CP, worsening SOB, DOE, wheezing, orthopnea, PND, worsening LE edema, palpitations, dizziness or syncope.  Pt denies neurological change such as new Headache, facial or extremity weakness.  Pt denies polydipsia, polyuria, or low sugar symptoms. Pt states overall good compliance with treatment and medications, good tolerability, and trying to follow lower cholesterol diet.  Pt denies worsening depressive symptoms, suicidal ideation or panic. No fever, wt loss, night sweats, loss of appetite, or other constitutional symptoms.  Pt states good ability with ADL's, low fall risk, home safety reviewed and adequate, no significant changes in hearing or vision, and occasionally active with exercise.  CBG;s 115 today, most in the low 100's.  Wt down form 206 to 189 in one yr with better diet.  Has had dx prostate ca per Dr Borden/urology with f/u nov 12, likely to not need specfic tx to start. Has seen Dr Lahoma Rocker who has suggested sleep study with his dry mouth and tongue issue not improving with nystatin, and pt wanted to mention today as well before he committed to the sleep test. Despite his recent wt loss he still has daytime somnolence Past Medical History  Diagnosis Date  . ALLERGIC RHINITIS 10/04/2008  . ASTHMA 10/04/2008  . BACK PAIN 04/18/2008  . BENIGN PROSTATIC HYPERTROPHY 03/09/2007  . COLONIC POLYPS, HX OF 03/09/2007  . COPD 10/20/2007  . DIABETES MELLITUS, TYPE II 03/09/2007  . FATIGUE 10/05/2007  . GOUT 10/19/2006  . HYPERLIPIDEMIA 10/19/2006  . HYPERTENSION 10/19/2006  . SWELLING MASS OR LUMP IN HEAD AND NECK 01/09/2010   Past Surgical History  Procedure Date  . Appendectomy     reports that he has quit smoking. He does not have any smokeless tobacco history on file. He reports that he does not drink alcohol. His drug history not on file. family  history includes Cancer in his other and Hypertension in his mother. No Known Allergies Current Outpatient Prescriptions on File Prior to Visit  Medication Sig Dispense Refill  . albuterol (PROVENTIL HFA;VENTOLIN HFA) 108 (90 BASE) MCG/ACT inhaler Inhale 2 puffs into the lungs 4 (four) times daily.  3 Inhaler  3  . alfuzosin (UROXATRAL) 10 MG 24 hr tablet Take 1 tablet (10 mg total) by mouth daily.  90 tablet  3  . allopurinol (ZYLOPRIM) 300 MG tablet Take 1 tablet (300 mg total) by mouth daily.  90 tablet  3  . amLODipine-benazepril (LOTREL) 5-20 MG per capsule Take 1 capsule by mouth daily.  90 capsule  3  . aspirin 81 MG tablet Take 81 mg by mouth daily.        Marland Kitchen atorvastatin (LIPITOR) 10 MG tablet Take 1 tablet (10 mg total) by mouth daily.  90 tablet  3  . glucose blood (ONE TOUCH TEST STRIPS) test strip Use as directed  100 each  3  . glucose blood (ONE TOUCH ULTRA TEST) test strip Use as directed 1 per day  100 each  5  . Omega-3 Fatty Acids (FISH OIL) 500 MG CAPS Take by mouth daily.        . ONE TOUCH LANCETS MISC Apply 1 application topically daily. Use as directed  100 each  3  . vardenafil (LEVITRA) 20 MG tablet 1 by mouth every other day as needed       .  budesonide-formoterol (SYMBICORT) 160-4.5 MCG/ACT inhaler Inhale 2 puffs into the lungs 2 (two) times daily.  3 Inhaler  3  . montelukast (SINGULAIR) 10 MG tablet Take 1 tablet (10 mg total) by mouth at bedtime.  90 tablet  3  . sildenafil (VIAGRA) 100 MG tablet Take 0.5-1 tablets (50-100 mg total) by mouth daily as needed for erectile dysfunction.  18 tablet  3   Review of Systems  Constitutional: Negative for diaphoresis and unexpected weight change.  HENT: Negative for tinnitus.   Eyes: Negative for photophobia and visual disturbance.  Respiratory: Negative for choking and stridor.   Gastrointestinal: Negative for vomiting and blood in stool.  Genitourinary: Negative for hematuria and decreased urine volume.    Musculoskeletal: Negative for gait problem.  Skin: Negative for color change and wound.  Neurological: Negative for tremors and numbness.  Psychiatric/Behavioral: Negative for decreased concentration. The patient is not hyperactive.       Objective:   Physical Exam BP 120/80  Pulse 62  Temp 97.8 F (36.6 C) (Oral)  Ht 5\' 5"  (1.651 m)  Wt 189 lb (85.73 kg)  BMI 31.45 kg/m2  SpO2 96% Physical Exam  VS noted Constitutional: Pt appears well-developed and well-nourished.  HENT: Head: Normocephalic.  Right Ear: External ear normal.  Left Ear: External ear normal.  Eyes: Conjunctivae and EOM are normal. Pupils are equal, round, and reactive to light.  Neck: Normal range of motion. Neck supple.  Cardiovascular: Normal rate and regular rhythm.   Pulmonary/Chest: Effort normal and breath sounds normal.  Abd:  Soft, NT, non-distended, + BS Neurological: Pt is alert. Not confused motor/sens/dtr gait intact Skin: Skin is warm. No erythema.  Psychiatric: Pt behavior is normal. Thought content normal. Jovial today    Assessment & Plan:

## 2012-01-19 NOTE — Assessment & Plan Note (Signed)
stable overall by hx and exam, most recent data reviewed with pt, and pt to continue medical treatment as before BP Readings from Last 3 Encounters:  01/19/12 120/80  07/20/11 98/58  02/11/11 104/58

## 2012-01-19 NOTE — Assessment & Plan Note (Signed)
stable overall by hx and exam, most recent data reviewed with pt, and pt to continue medical treatment as before Lab Results  Component Value Date   HGBA1C 6.3 01/20/2011   For labs today

## 2012-01-19 NOTE — Assessment & Plan Note (Signed)
I encouraged pt to contact ENT, I would agree sleep study is appropriate for further evalaution

## 2012-01-19 NOTE — Patient Instructions (Addendum)
Continue all other medications as before  - no changes today Please have the pharmacy call with any refills you may need (none were done today) OK to take the Nystatin as per ENT Please call ENT to arrange for your Sleep Apnea test;  If this does not work out, please call and I can refer you to [facility] for this as well Please go to LAB in the Basement for the blood and/or urine tests to be done today You will be contacted by phone if any changes need to be made immediately.  Otherwise, you will receive a letter about your results with an explanation. Please remember to sign up for My Chart at your earliest convenience, as this will be important to you in the future with finding out test results. Please keep your appointments with your specialists as you have planned  - ENT, and Urology as you have planned Please continue your efforts at being more active, low cholesterol diabetic diet, and weight control! Please return in 1 year for your yearly visit, or sooner if needed

## 2012-02-02 DIAGNOSIS — C61 Malignant neoplasm of prostate: Secondary | ICD-10-CM | POA: Diagnosis not present

## 2012-02-10 ENCOUNTER — Other Ambulatory Visit: Payer: Self-pay | Admitting: Internal Medicine

## 2012-02-23 ENCOUNTER — Telehealth: Payer: Self-pay | Admitting: Internal Medicine

## 2012-02-23 DIAGNOSIS — K148 Other diseases of tongue: Secondary | ICD-10-CM

## 2012-02-23 NOTE — Telephone Encounter (Signed)
Done per emr 

## 2012-02-23 NOTE — Telephone Encounter (Signed)
Patient sent in [place] message requesting a referral to and ENT at [facility] for a tongue issue that is not clearing up, please advise

## 2012-02-25 DIAGNOSIS — R220 Localized swelling, mass and lump, head: Secondary | ICD-10-CM | POA: Diagnosis not present

## 2012-02-25 DIAGNOSIS — R221 Localized swelling, mass and lump, neck: Secondary | ICD-10-CM | POA: Diagnosis not present

## 2012-03-01 ENCOUNTER — Other Ambulatory Visit (INDEPENDENT_AMBULATORY_CARE_PROVIDER_SITE_OTHER): Payer: Medicare Other

## 2012-03-01 ENCOUNTER — Encounter: Payer: Self-pay | Admitting: Internal Medicine

## 2012-03-01 ENCOUNTER — Ambulatory Visit (INDEPENDENT_AMBULATORY_CARE_PROVIDER_SITE_OTHER): Payer: Medicare Other | Admitting: Internal Medicine

## 2012-03-01 ENCOUNTER — Other Ambulatory Visit: Payer: Self-pay | Admitting: *Deleted

## 2012-03-01 VITALS — BP 122/78 | HR 58 | Temp 97.3°F | Ht 65.0 in | Wt 196.2 lb

## 2012-03-01 DIAGNOSIS — I10 Essential (primary) hypertension: Secondary | ICD-10-CM

## 2012-03-01 DIAGNOSIS — R109 Unspecified abdominal pain: Secondary | ICD-10-CM

## 2012-03-01 DIAGNOSIS — K219 Gastro-esophageal reflux disease without esophagitis: Secondary | ICD-10-CM | POA: Diagnosis not present

## 2012-03-01 DIAGNOSIS — E119 Type 2 diabetes mellitus without complications: Secondary | ICD-10-CM | POA: Diagnosis not present

## 2012-03-01 DIAGNOSIS — R197 Diarrhea, unspecified: Secondary | ICD-10-CM

## 2012-03-01 HISTORY — DX: Gastro-esophageal reflux disease without esophagitis: K21.9

## 2012-03-01 LAB — URINALYSIS, ROUTINE W REFLEX MICROSCOPIC
Bilirubin Urine: NEGATIVE
Hgb urine dipstick: NEGATIVE
Ketones, ur: NEGATIVE
Leukocytes, UA: NEGATIVE
Nitrite: NEGATIVE
Specific Gravity, Urine: 1.015 (ref 1.000–1.030)
Total Protein, Urine: NEGATIVE
Urine Glucose: NEGATIVE
Urobilinogen, UA: 0.2 (ref 0.0–1.0)
pH: 7.5 (ref 5.0–8.0)

## 2012-03-01 LAB — LIPASE: Lipase: 41 U/L (ref 11.0–59.0)

## 2012-03-01 LAB — CBC WITH DIFFERENTIAL/PLATELET
Basophils Absolute: 0 10*3/uL (ref 0.0–0.1)
Basophils Relative: 1.2 % (ref 0.0–3.0)
Eosinophils Absolute: 0.1 10*3/uL (ref 0.0–0.7)
Eosinophils Relative: 3.5 % (ref 0.0–5.0)
HCT: 43 % (ref 39.0–52.0)
Hemoglobin: 14.1 g/dL (ref 13.0–17.0)
Lymphocytes Relative: 40.1 % (ref 12.0–46.0)
Lymphs Abs: 1.4 10*3/uL (ref 0.7–4.0)
MCHC: 32.7 g/dL (ref 30.0–36.0)
MCV: 93.6 fl (ref 78.0–100.0)
Monocytes Absolute: 0.4 10*3/uL (ref 0.1–1.0)
Monocytes Relative: 11.9 % (ref 3.0–12.0)
Neutro Abs: 1.5 10*3/uL (ref 1.4–7.7)
Neutrophils Relative %: 43.3 % (ref 43.0–77.0)
Platelets: 211 10*3/uL (ref 150.0–400.0)
RBC: 4.6 Mil/uL (ref 4.22–5.81)
RDW: 15.4 % — ABNORMAL HIGH (ref 11.5–14.6)
WBC: 3.5 10*3/uL — ABNORMAL LOW (ref 4.5–10.5)

## 2012-03-01 LAB — HEPATIC FUNCTION PANEL
ALT: 23 U/L (ref 0–53)
AST: 23 U/L (ref 0–37)
Albumin: 4 g/dL (ref 3.5–5.2)
Alkaline Phosphatase: 57 U/L (ref 39–117)
Bilirubin, Direct: 0.1 mg/dL (ref 0.0–0.3)
Total Bilirubin: 0.8 mg/dL (ref 0.3–1.2)
Total Protein: 6.7 g/dL (ref 6.0–8.3)

## 2012-03-01 LAB — BASIC METABOLIC PANEL
BUN: 21 mg/dL (ref 6–23)
CO2: 33 mEq/L — ABNORMAL HIGH (ref 19–32)
Calcium: 9 mg/dL (ref 8.4–10.5)
Chloride: 103 mEq/L (ref 96–112)
Creatinine, Ser: 0.9 mg/dL (ref 0.4–1.5)
GFR: 109.06 mL/min (ref >60.00)
Glucose, Bld: 97 mg/dL (ref 70–99)
Potassium: 4.4 mEq/L (ref 3.5–5.1)
Sodium: 140 mEq/L (ref 135–145)

## 2012-03-01 LAB — HEMOGLOBIN A1C: Hgb A1c MFr Bld: 6.2 % (ref 4.6–6.5)

## 2012-03-01 LAB — SEDIMENTATION RATE: Sed Rate: 10 mm/hr (ref 0–22)

## 2012-03-01 MED ORDER — PREDNISONE 10 MG PO TABS: ORAL_TABLET | ORAL | Status: DC

## 2012-03-01 MED ORDER — ESOMEPRAZOLE MAGNESIUM 40 MG PO CPDR: 40.0000 mg | DELAYED_RELEASE_CAPSULE | Freq: Every day | ORAL | Status: DC

## 2012-03-01 NOTE — Progress Notes (Signed)
Subjective:    Patient ID: Allen Hunter., male    DOB: May 19, 1940, 71 y.o.   MRN: [id number]  HPI  Here with 3-4 wks persistent mild loose stools with intermittent mild crampy pain, without fever, n/v, wt loss or blood, and nothing seems to make better or worse.  Also incidentally with 2-3 days onset mild wheezing/sob/tighness with breathing deeper.  Pt denies fever, wt loss, night sweats, loss of appetite, or other constitutional symptoms  Pt denies chest pain, increased sob or doe, wheezing, orthopnea, PND, increased LE swelling, palpitations, dizziness or syncope except for the above.  Pt denies new neurological symptoms such as new headache, or facial or extremity weakness or numbness  .  Also with worsening reflux in the past month, Denies dysphagia, bowel change or blood. Past Medical History  Diagnosis Date  . ALLERGIC RHINITIS 10/04/2008  . ASTHMA 10/04/2008  . BACK PAIN 04/18/2008  . BENIGN PROSTATIC HYPERTROPHY 03/09/2007  . COLONIC POLYPS, HX OF 03/09/2007  . COPD 10/20/2007  . DIABETES MELLITUS, TYPE II 03/09/2007  . FATIGUE 10/05/2007  . GOUT 10/19/2006  . HYPERLIPIDEMIA 10/19/2006  . HYPERTENSION 10/19/2006  . SWELLING MASS OR LUMP IN HEAD AND NECK 01/09/2010  . Prostate cancer 01/19/2012  . GERD (gastroesophageal reflux disease) 03/01/2012   Past Surgical History  Procedure Date  . Appendectomy     reports that he has quit smoking. He does not have any smokeless tobacco history on file. He reports that he does not drink alcohol. His drug history not on file. family history includes Cancer in his other and Hypertension in his mother. No Known Allergies Current Outpatient Prescriptions on File Prior to Visit  Medication Sig Dispense Refill  . albuterol (PROVENTIL HFA;VENTOLIN HFA) 108 (90 BASE) MCG/ACT inhaler Inhale 2 puffs into the lungs 4 (four) times daily.  3 Inhaler  3  . alfuzosin (UROXATRAL) 10 MG 24 hr tablet Take 1 tablet (10 mg total) by mouth daily.  90 tablet  3   . allopurinol (ZYLOPRIM) 300 MG tablet Take 1 tablet (300 mg total) by mouth daily.  90 tablet  3  . amLODipine-benazepril (LOTREL) 5-20 MG per capsule Take 1 capsule by mouth daily.  90 capsule  3  . aspirin 81 MG tablet Take 81 mg by mouth daily.        Marland Kitchen atorvastatin (LIPITOR) 10 MG tablet TAKE 1 TABLET (10MG ) BY MOUTH DAILY  90 tablet  3  . budesonide-formoterol (SYMBICORT) 160-4.5 MCG/ACT inhaler Inhale 2 puffs into the lungs 2 (two) times daily.  3 Inhaler  3  . glucose blood (ONE TOUCH TEST STRIPS) test strip Use as directed  100 each  3  . montelukast (SINGULAIR) 10 MG tablet Take 1 tablet (10 mg total) by mouth at bedtime.  90 tablet  3  . nystatin (MYCOSTATIN) 100000 UNIT/ML suspension Take 5 mLs (500,000 Units total) by mouth 4 (four) times daily.  60 mL  1  . Omega-3 Fatty Acids (FISH OIL) 500 MG CAPS Take by mouth daily.        . ONE TOUCH LANCETS MISC Apply 1 application topically daily. Use as directed  100 each  3  . vardenafil (LEVITRA) 20 MG tablet 1 by mouth every other day as needed       . esomeprazole (NEXIUM) 40 MG capsule Take 1 capsule (40 mg total) by mouth daily.  90 capsule  3  . sildenafil (VIAGRA) 100 MG tablet Take 0.5-1 tablets (50-100 mg total) by  mouth daily as needed for erectile dysfunction.  18 tablet  3   Review of Systems  Constitutional: Negative for diaphoresis and unexpected weight change.  HENT: Negative for tinnitus.   Eyes: Negative for photophobia and visual disturbance.  Respiratory: Negative for choking and stridor.   Gastrointestinal: Negative for vomiting and blood in stool.  Genitourinary: Negative for hematuria and decreased urine volume.  Musculoskeletal: Negative for gait problem.  Skin: Negative for color change and wound.  Neurological: Negative for tremors and numbness.  Psychiatric/Behavioral: Negative for decreased concentration. The patient is not hyperactive.       Objective:   Physical Exam BP 122/78  Pulse 58  Temp 97.3  F (36.3 C) (Oral)  Ht 5\' 5"  (1.651 m)  Wt 196 lb 4 oz (89.018 kg)  BMI 32.66 kg/m2  SpO2 97% Physical Exam  VS noted Constitutional: Pt appears well-developed and well-nourished.  HENT: Head: Normocephalic.  Right Ear: External ear normal.  Left Ear: External ear normal.  Eyes: Conjunctivae and EOM are normal. Pupils are equal, round, and reactive to light.  Neck: Normal range of motion. Neck supple.  Cardiovascular: Normal rate and regular rhythm.   Pulmonary/Chest: Effort normal and breath sounds mild decreased, bilat few wheezes.  Abd:  Soft, NT, non-distended, + BS except for mild LLQ tender, without guarding or rebound Neurological: Pt is alert. Not confused  Skin: Skin is warm. No erythema.  Psychiatric: Pt behavior is normal. Thought content normal.     Assessment & Plan:

## 2012-03-01 NOTE — Assessment & Plan Note (Signed)
stable overall by hx and exam, most recent data reviewed with pt, and pt to continue medical treatment as before BP Readings from Last 3 Encounters:  03/01/12 122/78  01/19/12 120/80  07/20/11 98/58

## 2012-03-01 NOTE — Assessment & Plan Note (Signed)
stable overall by hx and exam, most recent data reviewed with pt, and pt to continue medical treatment as before Lab Results  Component Value Date   HGBA1C 6.2 03/01/2012

## 2012-03-01 NOTE — Assessment & Plan Note (Signed)
Unclear etiology, for labs and stool studies, immoidium prn,  to f/u any worsening symptoms or concerns

## 2012-03-01 NOTE — Assessment & Plan Note (Signed)
Mild to mod, for nexium 40 qd, to f/u any worsening symptoms or concerns

## 2012-03-01 NOTE — Patient Instructions (Addendum)
Take all new medications as prescribed  - the prednisone, and nexium OK to take immodium as needed for the loose stools Please go to LAB in the Basement for the blood and/or stool tests to be done today You will be contacted by phone if any changes need to be made immediately.  Otherwise, you will receive a letter about your results with an explanation, but please check with MyChart first. Thank you for enrolling in MyChart. Please follow the instructions below to securely access your online medical record. MyChart allows you to send messages to your doctor, view your test results, renew your prescriptions, schedule appointments, and more. Please return in 6 months, or sooner if needed

## 2012-03-02 LAB — CLOSTRIDIUM DIFFICILE EIA: CDIFTX: NEGATIVE

## 2012-03-03 LAB — OVA AND PARASITE EXAMINATION: OP: NONE SEEN

## 2012-03-05 LAB — STOOL CULTURE

## 2012-03-17 DIAGNOSIS — R197 Diarrhea, unspecified: Secondary | ICD-10-CM | POA: Diagnosis not present

## 2012-03-17 DIAGNOSIS — E119 Type 2 diabetes mellitus without complications: Secondary | ICD-10-CM | POA: Diagnosis not present

## 2012-03-17 DIAGNOSIS — R7309 Other abnormal glucose: Secondary | ICD-10-CM | POA: Diagnosis not present

## 2012-03-17 DIAGNOSIS — R109 Unspecified abdominal pain: Secondary | ICD-10-CM | POA: Diagnosis not present

## 2012-04-11 ENCOUNTER — Other Ambulatory Visit: Payer: Self-pay | Admitting: Internal Medicine

## 2012-04-18 DIAGNOSIS — R1032 Left lower quadrant pain: Secondary | ICD-10-CM | POA: Diagnosis not present

## 2012-04-18 DIAGNOSIS — R198 Other specified symptoms and signs involving the digestive system and abdomen: Secondary | ICD-10-CM | POA: Diagnosis not present

## 2012-04-18 DIAGNOSIS — K5732 Diverticulitis of large intestine without perforation or abscess without bleeding: Secondary | ICD-10-CM | POA: Diagnosis not present

## 2012-05-02 ENCOUNTER — Other Ambulatory Visit: Payer: Self-pay | Admitting: Internal Medicine

## 2012-05-07 ENCOUNTER — Other Ambulatory Visit: Payer: Self-pay

## 2012-05-11 ENCOUNTER — Ambulatory Visit: Payer: Self-pay | Admitting: Gastroenterology

## 2012-05-11 DIAGNOSIS — Z79899 Other long term (current) drug therapy: Secondary | ICD-10-CM | POA: Diagnosis not present

## 2012-05-11 DIAGNOSIS — Z800 Family history of malignant neoplasm of digestive organs: Secondary | ICD-10-CM | POA: Diagnosis not present

## 2012-05-11 DIAGNOSIS — Z86010 Personal history of colonic polyps: Secondary | ICD-10-CM | POA: Diagnosis not present

## 2012-05-11 DIAGNOSIS — Z7982 Long term (current) use of aspirin: Secondary | ICD-10-CM | POA: Diagnosis not present

## 2012-05-11 DIAGNOSIS — Z87891 Personal history of nicotine dependence: Secondary | ICD-10-CM | POA: Diagnosis not present

## 2012-05-11 DIAGNOSIS — E785 Hyperlipidemia, unspecified: Secondary | ICD-10-CM | POA: Diagnosis not present

## 2012-05-11 DIAGNOSIS — R1032 Left lower quadrant pain: Secondary | ICD-10-CM | POA: Diagnosis not present

## 2012-05-11 DIAGNOSIS — Z8546 Personal history of malignant neoplasm of prostate: Secondary | ICD-10-CM | POA: Diagnosis not present

## 2012-05-11 DIAGNOSIS — K219 Gastro-esophageal reflux disease without esophagitis: Secondary | ICD-10-CM | POA: Diagnosis not present

## 2012-05-11 DIAGNOSIS — Z09 Encounter for follow-up examination after completed treatment for conditions other than malignant neoplasm: Secondary | ICD-10-CM | POA: Diagnosis not present

## 2012-05-11 DIAGNOSIS — R1012 Left upper quadrant pain: Secondary | ICD-10-CM | POA: Diagnosis not present

## 2012-05-11 DIAGNOSIS — E119 Type 2 diabetes mellitus without complications: Secondary | ICD-10-CM | POA: Diagnosis not present

## 2012-05-11 DIAGNOSIS — M545 Low back pain, unspecified: Secondary | ICD-10-CM | POA: Diagnosis not present

## 2012-05-11 DIAGNOSIS — Z809 Family history of malignant neoplasm, unspecified: Secondary | ICD-10-CM | POA: Diagnosis not present

## 2012-05-11 DIAGNOSIS — J449 Chronic obstructive pulmonary disease, unspecified: Secondary | ICD-10-CM | POA: Diagnosis not present

## 2012-05-11 DIAGNOSIS — R198 Other specified symptoms and signs involving the digestive system and abdomen: Secondary | ICD-10-CM | POA: Diagnosis not present

## 2012-06-30 ENCOUNTER — Other Ambulatory Visit: Payer: Self-pay

## 2012-06-30 MED ORDER — ESOMEPRAZOLE MAGNESIUM 40 MG PO CPDR: 40.0000 mg | DELAYED_RELEASE_CAPSULE | Freq: Every day | ORAL | Status: DC

## 2012-07-15 DIAGNOSIS — R197 Diarrhea, unspecified: Secondary | ICD-10-CM | POA: Diagnosis not present

## 2012-08-12 DIAGNOSIS — N401 Enlarged prostate with lower urinary tract symptoms: Secondary | ICD-10-CM | POA: Diagnosis not present

## 2012-08-12 DIAGNOSIS — C61 Malignant neoplasm of prostate: Secondary | ICD-10-CM | POA: Diagnosis not present

## 2012-08-17 ENCOUNTER — Other Ambulatory Visit: Payer: Self-pay | Admitting: Internal Medicine

## 2012-08-30 ENCOUNTER — Encounter: Payer: Self-pay | Admitting: Internal Medicine

## 2012-08-30 ENCOUNTER — Ambulatory Visit (INDEPENDENT_AMBULATORY_CARE_PROVIDER_SITE_OTHER): Payer: Medicare Other | Admitting: Internal Medicine

## 2012-08-30 VITALS — BP 108/70 | HR 64 | Temp 98.3°F | Ht 65.0 in | Wt 199.2 lb

## 2012-08-30 DIAGNOSIS — J449 Chronic obstructive pulmonary disease, unspecified: Secondary | ICD-10-CM

## 2012-08-30 DIAGNOSIS — I10 Essential (primary) hypertension: Secondary | ICD-10-CM | POA: Diagnosis not present

## 2012-08-30 DIAGNOSIS — E785 Hyperlipidemia, unspecified: Secondary | ICD-10-CM

## 2012-08-30 DIAGNOSIS — E119 Type 2 diabetes mellitus without complications: Secondary | ICD-10-CM | POA: Diagnosis not present

## 2012-08-30 NOTE — Progress Notes (Addendum)
  Subjective:    Patient ID: Allen Hunter., male    DOB: 04/30/1940, 72 y.o.   MRN: [id number]  HPI  Here to f/u; overall doing ok,  Pt denies chest pain, increased sob or doe, wheezing, orthopnea, PND, increased LE swelling, palpitations, dizziness or syncope.  Pt denies polydipsia, polyuria, or low sugar symptoms such as weakness or confusion improved with po intake.  Pt denies new neurological symptoms such as new headache, or facial or extremity weakness or numbness.   Pt states overall good compliance with meds, has been trying to follow lower cholesterol diet, with wt overall stable,  but little exercise however. Loose bowels from last visit resolved, using activia otc prn.  But has mild worsening reflux but better with nexium, no dysphagia, n/v, bowel change or blood, wt loss.  Would like the shingles shot but needs rx to go to [facility]. Past Medical History  Diagnosis Date  . ALLERGIC RHINITIS 10/04/2008  . ASTHMA 10/04/2008  . BACK PAIN 04/18/2008  . BENIGN PROSTATIC HYPERTROPHY 03/09/2007  . COLONIC POLYPS, HX OF 03/09/2007  . COPD 10/20/2007  . DIABETES MELLITUS, TYPE II 03/09/2007  . FATIGUE 10/05/2007  . GOUT 10/19/2006  . HYPERLIPIDEMIA 10/19/2006  . HYPERTENSION 10/19/2006  . SWELLING MASS OR LUMP IN HEAD AND NECK 01/09/2010  . Prostate cancer 01/19/2012  . GERD (gastroesophageal reflux disease) 03/01/2012   Past Surgical History  Procedure Laterality Date  . Appendectomy      reports that he has quit smoking. He does not have any smokeless tobacco history on file. He reports that he does not drink alcohol. His drug history is not on file. family history includes Cancer in his other and Hypertension in his mother. No Known Allergies  Review of Systems  Constitutional: Negative for unexpected weight change, or unusual diaphoresis  HENT: Negative for tinnitus.   Eyes: Negative for photophobia and visual disturbance.  Respiratory: Negative for choking and stridor.    Gastrointestinal: Negative for vomiting and blood in stool.  Genitourinary: Negative for hematuria and decreased urine volume.  Musculoskeletal: Negative for acute joint swelling Skin: Negative for color change and wound.  Neurological: Negative for tremors and numbness other than noted  Psychiatric/Behavioral: Negative for decreased concentration or  hyperactivity.       Objective:   Physical Exam BP 108/70  Pulse 64  Temp(Src) 98.3 F (36.8 C) (Oral)  Ht 5\' 5"  (1.651 m)  Wt 199 lb 4 oz (90.379 kg)  BMI 33.16 kg/m2  SpO2 94% VS noted,  Constitutional: Pt appears well-developed and well-nourished.  HENT: Head: NCAT.  Right Ear: External ear normal.  Left Ear: External ear normal.  Eyes: Conjunctivae and EOM are normal. Pupils are equal, round, and reactive to light.  Neck: Normal range of motion. Neck supple.  Cardiovascular: Normal rate and regular rhythm.   Pulmonary/Chest: Effort normal and breath sounds normal.  Abd:  Soft, NT, non-distended, + BS Neurological: Pt is alert. Not confused  Skin: Skin is warm. No erythema.  Psychiatric: Pt behavior is normal. Thought content normal.     Assessment & Plan:  Quality Measures addressed:  Pneumonia Vaccine: pt declines, may self-refer to local pharmacy

## 2012-08-30 NOTE — Patient Instructions (Signed)
Please continue all other medications as before, and refills have been done if requested. No blood work felt needed today You are given the prescription for Zostavax to take to your pharmacy Please have the pharmacy call with any other refills you may need. Please continue your efforts at being more active, low cholesterol diet, and weight control. Please keep your appointments with your specialists if you have planned Please return in 6 months, or sooner if needed

## 2012-08-31 NOTE — Assessment & Plan Note (Signed)
stable overall by history and exam, recent data reviewed with pt, and pt to continue medical treatment as before,  to f/u any worsening symptoms or concerns Lab Results  Component Value Date   LDLCALC 75 01/19/2012

## 2012-08-31 NOTE — Assessment & Plan Note (Signed)
stable overall by history and exam, recent data reviewed with pt, and pt to continue medical treatment as before,  to f/u any worsening symptoms or concerns Lab Results  Component Value Date   HGBA1C 6.2 03/01/2012

## 2012-08-31 NOTE — Assessment & Plan Note (Signed)
stable overall by history and exam, recent data reviewed with pt, and pt to continue medical treatment as before,  to f/u any worsening symptoms or concerns SpO2 Readings from Last 3 Encounters:  08/30/12 94%  03/01/12 97%  01/19/12 96%

## 2012-08-31 NOTE — Assessment & Plan Note (Signed)
stable overall by history and exam, recent data reviewed with pt, and pt to continue medical treatment as before,  to f/u any worsening symptoms or concerns BP Readings from Last 3 Encounters:  08/30/12 108/70  03/01/12 122/78  01/19/12 120/80

## 2012-11-03 ENCOUNTER — Encounter: Payer: Self-pay | Admitting: Internal Medicine

## 2013-01-18 ENCOUNTER — Other Ambulatory Visit: Payer: Self-pay | Admitting: Internal Medicine

## 2013-01-20 DIAGNOSIS — C61 Malignant neoplasm of prostate: Secondary | ICD-10-CM | POA: Diagnosis not present

## 2013-01-26 ENCOUNTER — Other Ambulatory Visit: Payer: Self-pay

## 2013-01-27 DIAGNOSIS — N401 Enlarged prostate with lower urinary tract symptoms: Secondary | ICD-10-CM | POA: Diagnosis not present

## 2013-01-27 DIAGNOSIS — C61 Malignant neoplasm of prostate: Secondary | ICD-10-CM | POA: Diagnosis not present

## 2013-01-27 DIAGNOSIS — N139 Obstructive and reflux uropathy, unspecified: Secondary | ICD-10-CM | POA: Diagnosis not present

## 2013-02-22 DIAGNOSIS — L0291 Cutaneous abscess, unspecified: Secondary | ICD-10-CM | POA: Diagnosis not present

## 2013-02-27 ENCOUNTER — Encounter: Payer: Self-pay | Admitting: Internal Medicine

## 2013-02-27 ENCOUNTER — Other Ambulatory Visit: Payer: Self-pay | Admitting: Internal Medicine

## 2013-02-27 NOTE — Telephone Encounter (Signed)
Refill done.  

## 2013-03-07 ENCOUNTER — Ambulatory Visit (INDEPENDENT_AMBULATORY_CARE_PROVIDER_SITE_OTHER): Payer: Medicare Other | Admitting: Internal Medicine

## 2013-03-07 ENCOUNTER — Encounter: Payer: Self-pay | Admitting: Internal Medicine

## 2013-03-07 ENCOUNTER — Other Ambulatory Visit (INDEPENDENT_AMBULATORY_CARE_PROVIDER_SITE_OTHER): Payer: Medicare Other

## 2013-03-07 VITALS — BP 120/78 | HR 81 | Temp 97.9°F | Wt 197.0 lb

## 2013-03-07 DIAGNOSIS — Z2911 Encounter for prophylactic immunotherapy for respiratory syncytial virus (RSV): Secondary | ICD-10-CM

## 2013-03-07 DIAGNOSIS — Z23 Encounter for immunization: Secondary | ICD-10-CM | POA: Diagnosis not present

## 2013-03-07 DIAGNOSIS — J4489 Other specified chronic obstructive pulmonary disease: Secondary | ICD-10-CM

## 2013-03-07 DIAGNOSIS — J449 Chronic obstructive pulmonary disease, unspecified: Secondary | ICD-10-CM

## 2013-03-07 DIAGNOSIS — I10 Essential (primary) hypertension: Secondary | ICD-10-CM

## 2013-03-07 DIAGNOSIS — E119 Type 2 diabetes mellitus without complications: Secondary | ICD-10-CM

## 2013-03-07 DIAGNOSIS — E785 Hyperlipidemia, unspecified: Secondary | ICD-10-CM

## 2013-03-07 DIAGNOSIS — C61 Malignant neoplasm of prostate: Secondary | ICD-10-CM

## 2013-03-07 LAB — LIPID PANEL
Cholesterol: 131 mg/dL (ref 0–200)
HDL: 49.2 mg/dL (ref >39.00)
LDL Cholesterol: 77 mg/dL (ref 0–99)
Total CHOL/HDL Ratio: 3
Triglycerides: 25 mg/dL (ref 0.0–149.0)
VLDL: 5 mg/dL (ref 0.0–40.0)

## 2013-03-07 LAB — CBC WITH DIFFERENTIAL/PLATELET
Basophils Absolute: 0 10*3/uL (ref 0.0–0.1)
Basophils Relative: 0.4 % (ref 0.0–3.0)
Eosinophils Absolute: 0.1 10*3/uL (ref 0.0–0.7)
Eosinophils Relative: 3.1 % (ref 0.0–5.0)
HCT: 42.9 % (ref 39.0–52.0)
Hemoglobin: 14.2 g/dL (ref 13.0–17.0)
Lymphocytes Relative: 35.9 % (ref 12.0–46.0)
Lymphs Abs: 1.4 10*3/uL (ref 0.7–4.0)
MCHC: 33.1 g/dL (ref 30.0–36.0)
MCV: 92.3 fl (ref 78.0–100.0)
Monocytes Absolute: 0.4 10*3/uL (ref 0.1–1.0)
Monocytes Relative: 10 % (ref 3.0–12.0)
Neutro Abs: 2 10*3/uL (ref 1.4–7.7)
Neutrophils Relative %: 50.6 % (ref 43.0–77.0)
Platelets: 189 10*3/uL (ref 150.0–400.0)
RBC: 4.65 Mil/uL (ref 4.22–5.81)
RDW: 14.6 % (ref 11.5–14.6)
WBC: 3.9 10*3/uL — ABNORMAL LOW (ref 4.5–10.5)

## 2013-03-07 LAB — BASIC METABOLIC PANEL
BUN: 21 mg/dL (ref 6–23)
CO2: 31 mEq/L (ref 19–32)
Calcium: 9.5 mg/dL (ref 8.4–10.5)
Chloride: 102 mEq/L (ref 96–112)
Creatinine, Ser: 1 mg/dL (ref 0.4–1.5)
GFR: 91.72 mL/min (ref >60.00)
Glucose, Bld: 88 mg/dL (ref 70–99)
Potassium: 4.6 mEq/L (ref 3.5–5.1)
Sodium: 138 mEq/L (ref 135–145)

## 2013-03-07 LAB — URINALYSIS, ROUTINE W REFLEX MICROSCOPIC
Bilirubin Urine: NEGATIVE
Hgb urine dipstick: NEGATIVE
Ketones, ur: NEGATIVE
Leukocytes, UA: NEGATIVE
Nitrite: NEGATIVE
Specific Gravity, Urine: <=1.005 (ref 1.000–1.030)
Total Protein, Urine: NEGATIVE
Urine Glucose: NEGATIVE
Urobilinogen, UA: 0.2 (ref 0.0–1.0)
WBC, UA: NONE SEEN (ref 0–?)
pH: 7 (ref 5.0–8.0)

## 2013-03-07 LAB — MICROALBUMIN / CREATININE URINE RATIO
Creatinine,U: 47.2 mg/dL
Microalb Creat Ratio: 0.4 mg/g (ref 0.0–30.0)
Microalb, Ur: 0.2 mg/dL (ref 0.0–1.9)

## 2013-03-07 LAB — HEPATIC FUNCTION PANEL
ALT: 27 U/L (ref 0–53)
AST: 26 U/L (ref 0–37)
Albumin: 4.4 g/dL (ref 3.5–5.2)
Alkaline Phosphatase: 58 U/L (ref 39–117)
Bilirubin, Direct: 0.1 mg/dL (ref 0.0–0.3)
Total Bilirubin: 0.8 mg/dL (ref 0.3–1.2)
Total Protein: 6.7 g/dL (ref 6.0–8.3)

## 2013-03-07 LAB — TSH: TSH: 0.8 u[IU]/mL (ref 0.35–5.50)

## 2013-03-07 LAB — HEMOGLOBIN A1C: Hgb A1c MFr Bld: 6.2 % (ref 4.6–6.5)

## 2013-03-07 NOTE — Assessment & Plan Note (Addendum)
stable overall by history and exam, recent data reviewed with pt, and pt to continue medical treatment as before,  to f/u any worsening symptoms or concerns BP Readings from Last 3 Encounters:  03/07/13 120/78  08/30/12 108/70  03/01/12 122/78   ECG reviewed as per emr

## 2013-03-07 NOTE — Progress Notes (Signed)
Pre-visit discussion using our clinic review tool. No additional management support is needed unless otherwise documented below in the visit note.  

## 2013-03-07 NOTE — Addendum Note (Signed)
Addended by: Scharlene Gloss B on: 03/07/2013 09:01 AM   Modules accepted: Orders

## 2013-03-07 NOTE — Assessment & Plan Note (Addendum)
stable overall by history and exam, recent data reviewed with pt, and pt to continue medical treatment as before,  to f/u any worsening symptoms or concerns SpO2 Readings from Last 3 Encounters:  03/07/13 94%  08/30/12 94%  03/01/12 97%   Note:  Total time for pt hx, exam, review of record with pt in the room, determination of diagnoses and plan for further eval and tx is > 40 min, with over 50% spent in coordination and counseling of patient

## 2013-03-07 NOTE — Assessment & Plan Note (Signed)
For f/u with urology as planned

## 2013-03-07 NOTE — Progress Notes (Signed)
Subjective:    Patient ID: Stephanie Acre., male    DOB: 03/24/1940, 72 y.o.   MRN: [id number]  HPI  Here for yearly f/u;  Overall doing ok;  Pt denies CP, worsening SOB, DOE, wheezing, orthopnea, PND, worsening LE edema, palpitations, dizziness or syncope.  Pt denies neurological change such as new headache, facial or extremity weakness.  Pt denies polydipsia, polyuria, or low sugar symptoms. Pt states overall good compliance with treatment and medications, good tolerability, and has been trying to follow lower cholesterol diet.  Pt denies worsening depressive symptoms, suicidal ideation or panic. No fever, night sweats, wt loss, loss of appetite, or other constitutional symptoms.  Pt states good ability with ADL's, has low fall risk, home safety reviewed and adequate, no other significant changes in hearing or vision, and only occasionally active with exercise. Has had some mild reflux breakthrough with ST in the AM, and occas loose stools but now improved with otc probiotics. Does not always take the nexium daily.Has seen Gi,  Up to date with colon screening. Due for PSA and DRE with urology about mar 2015 Past Medical History  Diagnosis Date  . ALLERGIC RHINITIS 10/04/2008  . ASTHMA 10/04/2008  . BACK PAIN 04/18/2008  . BENIGN PROSTATIC HYPERTROPHY 03/09/2007  . COLONIC POLYPS, HX OF 03/09/2007  . COPD 10/20/2007  . DIABETES MELLITUS, TYPE II 03/09/2007  . FATIGUE 10/05/2007  . GOUT 10/19/2006  . HYPERLIPIDEMIA 10/19/2006  . HYPERTENSION 10/19/2006  . SWELLING MASS OR LUMP IN HEAD AND NECK 01/09/2010  . Prostate cancer 01/19/2012  . GERD (gastroesophageal reflux disease) 03/01/2012   Past Surgical History  Procedure Laterality Date  . Appendectomy      reports that he has quit smoking. He does not have any smokeless tobacco history on file. He reports that he does not drink alcohol. His drug history is not on file. family history includes Cancer in his other; Hypertension in his  mother. No Known Allergies Current Outpatient Prescriptions on File Prior to Visit  Medication Sig Dispense Refill  . albuterol (PROVENTIL HFA;VENTOLIN HFA) 108 (90 BASE) MCG/ACT inhaler Inhale 2 puffs into the lungs 4 (four) times daily.  3 Inhaler  3  . allopurinol (ZYLOPRIM) 300 MG tablet TAKE 1 TABLET (300MG ) BY MOUTH DAILY  90 tablet  3  . amLODipine-benazepril (LOTREL) 5-20 MG per capsule TAKE 1 CAPSULE BY MOUTH DAILY  90 capsule  3  . aspirin 81 MG tablet Take 81 mg by mouth daily.        Marland Kitchen atorvastatin (LIPITOR) 10 MG tablet TAKE 1 TABLET DAILY  90 tablet  0  . budesonide-formoterol (SYMBICORT) 160-4.5 MCG/ACT inhaler Inhale 2 puffs into the lungs 2 (two) times daily.  3 Inhaler  3  . esomeprazole (NEXIUM) 40 MG capsule Take 1 capsule (40 mg total) by mouth daily.  90 capsule  3  . montelukast (SINGULAIR) 10 MG tablet TAKE 1 TABLET (10MG  TOTAL ) BY MOUTH AT BEDTIME  90 tablet  2  . Omega-3 Fatty Acids (FISH OIL) 500 MG CAPS Take by mouth daily.        . ONE TOUCH LANCETS MISC Apply 1 application topically daily. Use as directed  100 each  3  . ONE TOUCH ULTRA TEST test strip USE ONCE DAILY AS DIRECTED  100 each  11  . vardenafil (LEVITRA) 20 MG tablet 1 by mouth every other day as needed       . VIAGRA 100 MG tablet TAKE 1/2  TO 1 TABLET (50 TO 100MG ) BY MOUTH DAILY AS NEEDED FOR ERECTILE DYSFUNCTION  18 tablet  1   No current facility-administered medications on file prior to visit.   Review of Systems Constitutional: Negative for diaphoresis, activity change, appetite change or unexpected weight change.  HENT: Negative for hearing loss, ear pain, facial swelling, mouth sores and neck stiffness.   Eyes: Negative for pain, redness and visual disturbance.  Respiratory: Negative for shortness of breath and wheezing.   Cardiovascular: Negative for chest pain and palpitations.  Gastrointestinal: Negative for diarrhea, blood in stool, abdominal distention or other pain Genitourinary:  Negative for hematuria, flank pain or change in urine volume.  Musculoskeletal: Negative for myalgias and joint swelling.  Skin: Negative for color change and wound.  Neurological: Negative for syncope and numbness. other than noted Hematological: Negative for adenopathy.  Psychiatric/Behavioral: Negative for hallucinations, self-injury, decreased concentration and agitation.      Objective:   Physical Exam Blood pressure 120/78, pulse 81, temperature 97.9 F (36.6 C), temperature source Oral, weight 197 lb (89.359 kg), SpO2 94.00%. VS noted,  Constitutional: Pt is oriented to person, place, and time. Appears well-developed and well-nourished.  Head: Normocephalic and atraumatic.  Right Ear: External ear normal.  Left Ear: External ear normal.  Nose: Nose normal.  Mouth/Throat: Oropharynx is clear and moist.  Eyes: Conjunctivae and EOM are normal. Pupils are equal, round, and reactive to light.  Neck: Normal range of motion. Neck supple. No JVD present. No tracheal deviation present.  Cardiovascular: Normal rate, regular rhythm, normal heart sounds and intact distal pulses.   Pulmonary/Chest: Effort normal and breath sounds normal.  Abdominal: Soft. Bowel sounds are normal. There is no tenderness. No HSM  Musculoskeletal: Normal range of motion. Exhibits no edema.  Lymphadenopathy:  Has no cervical adenopathy.  Neurological: Pt is alert and oriented to person, place, and time. Pt has normal reflexes. No cranial nerve deficit.  Skin: Skin is warm and dry. No rash noted.  Psychiatric:  Has  normal mood and affect. Behavior is normal.         Assessment & Plan:

## 2013-03-07 NOTE — Addendum Note (Signed)
Addended by: Scharlene Gloss B on: 03/07/2013 09:07 AM   Modules accepted: Orders

## 2013-03-07 NOTE — Assessment & Plan Note (Signed)
Diet controlled, stable overall by history and exam, recent data reviewed with pt, and pt to continue medical treatment as before,  to f/u any worsening symptoms or concerns Lab Results  Component Value Date   HGBA1C 6.2 03/01/2012

## 2013-03-07 NOTE — Assessment & Plan Note (Signed)
stable overall by history and exam, recent data reviewed with pt, and pt to continue medical treatment as before,  to f/u any worsening symptoms or concerns Lab Results  Component Value Date   LDLCALC 75 01/19/2012

## 2013-03-07 NOTE — Patient Instructions (Addendum)
You had the shingles shot today since it is covered under [employer] Please return in 2 wks for a Nurse Visit for the new Prevnar pneumonia shot OK to restart the Nexium.  Your EKG was ok today Please continue all other medications as before, and refills have been done if requested. Please have the pharmacy call with any other refills you may need. Please continue your efforts at being more active, low cholesterol diet, and weight control. You are otherwise up to date with prevention measures today. Please go to the LAB in the Basement (turn left off the elevator) for the tests to be done today You will be contacted by phone if any changes need to be made immediately.  Otherwise, you will receive a letter about your results with an explanation, but please check with MyChart first.  Please remember to sign up for My Chart if you have not done so, as this will be important to you in the future with finding out test results, communicating by private email, and scheduling acute appointments online when needed.  Please return in 1 year for your yearly visit, or sooner if needed

## 2013-04-04 ENCOUNTER — Ambulatory Visit (INDEPENDENT_AMBULATORY_CARE_PROVIDER_SITE_OTHER): Payer: Medicare Other | Admitting: *Deleted

## 2013-04-04 DIAGNOSIS — Z23 Encounter for immunization: Secondary | ICD-10-CM

## 2013-04-04 MED ORDER — PNEUMOCOCCAL 13-VAL CONJ VACC IM SUSP: 0.5000 mL | INTRAMUSCULAR | Status: DC

## 2013-06-13 ENCOUNTER — Other Ambulatory Visit: Payer: Self-pay | Admitting: Internal Medicine

## 2013-07-03 DIAGNOSIS — J060 Acute laryngopharyngitis: Secondary | ICD-10-CM | POA: Diagnosis not present

## 2013-07-17 ENCOUNTER — Other Ambulatory Visit: Payer: Self-pay | Admitting: Internal Medicine

## 2013-07-19 LAB — HM DIABETES EYE EXAM

## 2013-08-09 DIAGNOSIS — C61 Malignant neoplasm of prostate: Secondary | ICD-10-CM | POA: Diagnosis not present

## 2013-08-16 DIAGNOSIS — N401 Enlarged prostate with lower urinary tract symptoms: Secondary | ICD-10-CM | POA: Diagnosis not present

## 2013-08-16 DIAGNOSIS — C61 Malignant neoplasm of prostate: Secondary | ICD-10-CM | POA: Diagnosis not present

## 2013-08-23 ENCOUNTER — Other Ambulatory Visit: Payer: Self-pay

## 2013-08-23 MED ORDER — BUDESONIDE-FORMOTEROL FUMARATE 160-4.5 MCG/ACT IN AERO
2.0000 | INHALATION_SPRAY | Freq: Two times a day (BID) | RESPIRATORY_TRACT | Status: DC
Start: 2013-08-23 — End: 2013-09-05

## 2013-09-05 ENCOUNTER — Ambulatory Visit (INDEPENDENT_AMBULATORY_CARE_PROVIDER_SITE_OTHER): Payer: Medicare Other | Admitting: Internal Medicine

## 2013-09-05 ENCOUNTER — Encounter: Payer: Self-pay | Admitting: Internal Medicine

## 2013-09-05 VITALS — BP 118/72 | HR 73 | Temp 98.4°F | Ht 65.0 in | Wt 203.0 lb

## 2013-09-05 DIAGNOSIS — E119 Type 2 diabetes mellitus without complications: Secondary | ICD-10-CM

## 2013-09-05 DIAGNOSIS — C61 Malignant neoplasm of prostate: Secondary | ICD-10-CM

## 2013-09-05 DIAGNOSIS — N529 Male erectile dysfunction, unspecified: Secondary | ICD-10-CM

## 2013-09-05 DIAGNOSIS — I10 Essential (primary) hypertension: Secondary | ICD-10-CM

## 2013-09-05 DIAGNOSIS — J45901 Unspecified asthma with (acute) exacerbation: Secondary | ICD-10-CM

## 2013-09-05 DIAGNOSIS — E785 Hyperlipidemia, unspecified: Secondary | ICD-10-CM | POA: Diagnosis not present

## 2013-09-05 MED ORDER — METHYLPREDNISOLONE ACETATE 80 MG/ML IJ SUSP
80.0000 mg | Freq: Once | INTRAMUSCULAR | Status: AC
  Administered 2013-09-05: 80 mg via INTRAMUSCULAR

## 2013-09-05 MED ORDER — BUDESONIDE-FORMOTEROL FUMARATE 160-4.5 MCG/ACT IN AERO: 2.0000 | INHALATION_SPRAY | Freq: Two times a day (BID) | RESPIRATORY_TRACT | Status: DC

## 2013-09-05 MED ORDER — SILDENAFIL CITRATE 100 MG PO TABS: ORAL_TABLET | ORAL | Status: DC

## 2013-09-05 MED ORDER — SILDENAFIL CITRATE 100 MG PO TABS
ORAL_TABLET | ORAL | Status: DC
Start: 2013-09-05 — End: 2013-09-05

## 2013-09-05 NOTE — Assessment & Plan Note (Signed)
Mild to mod, for depomedrol IM, declines predpac due to risk of wt gain,re-start symbicort - done erx,  to f/u any worsening symptoms or concerns

## 2013-09-05 NOTE — Patient Instructions (Signed)
Please continue all other medications as before, and refills have been done if requested.  Please have the pharmacy call with any other refills you may need.  Please continue your efforts at being more active, low cholesterol diet, and weight control.  You are otherwise up to date with prevention measures today.  Please keep your appointments with your specialists as you may have planned - urology  We can hold on further lab today  Please return in 6 months, or sooner if needed

## 2013-09-05 NOTE — Assessment & Plan Note (Signed)
Asympt, ,excellent control for several yrs, delcines further labs today Lab Results  Component Value Date   HGBA1C 6.2 03/07/2013

## 2013-09-05 NOTE — Addendum Note (Signed)
Addended by: [name] on: 09/05/2013 10:00 AM   Modules accepted: Orders

## 2013-09-05 NOTE — Assessment & Plan Note (Signed)
oko for refill viagra

## 2013-09-05 NOTE — Assessment & Plan Note (Signed)
Has f/u next mo with Dr Borden/urology, with repeat biopsy soon

## 2013-09-05 NOTE — Addendum Note (Signed)
Addended by: Sharon Seller B on: 09/05/2013 10:10 AM   Modules accepted: Orders

## 2013-09-05 NOTE — Assessment & Plan Note (Signed)
stable overall by history and exam, recent data reviewed with pt, and pt to continue medical treatment as before,  to f/u any worsening symptoms or concerns BP Readings from Last 3 Encounters:  09/05/13 118/72  03/07/13 120/78  08/30/12 108/70

## 2013-09-05 NOTE — Progress Notes (Signed)
Pre visit review using our clinic review tool, if applicable. No additional management support is needed unless otherwise documented below in the visit note. 

## 2013-09-05 NOTE — Assessment & Plan Note (Signed)
stable overall by history and exam, recent data reviewed with pt, and pt to continue medical treatment as before,  to f/u any worsening symptoms or concerns Lab Results  Component Value Date   LDLCALC 77 03/07/2013

## 2013-09-05 NOTE — Progress Notes (Signed)
Subjective:    Patient ID: Allen Dakin., male    DOB: 02-22-1941, 73 y.o.   MRN: [id number]  HPI Here to f/u; overall doing ok,  Pt denies chest pain, increased sob or doe, wheezing, orthopnea, PND, increased LE swelling, palpitations, dizziness or syncope.  Pt denies polydipsia, polyuria, or low sugar symptoms such as weakness or confusion improved with po intake.  Pt denies new neurological symptoms such as new headache, or facial or extremity weakness or numbness.   Pt states overall good compliance with meds, has been trying to follow lower cholesterol, diabetic diet,  Out of symbicort x 1 mo, mild wheezing and sob today, limits his treadmill ability, gained a few lbs recently.  Has appt for f/u prostate ca with Dr Alinda Money next month, on watch and wait plan for now, for repeat biopsy soon as well. Past Medical History  Diagnosis Date  . ALLERGIC RHINITIS 10/04/2008  . ASTHMA 10/04/2008  . BACK PAIN 04/18/2008  . BENIGN PROSTATIC HYPERTROPHY 03/09/2007  . COLONIC POLYPS, HX OF 03/09/2007  . COPD 10/20/2007  . DIABETES MELLITUS, TYPE II 03/09/2007  . FATIGUE 10/05/2007  . GOUT 10/19/2006  . HYPERLIPIDEMIA 10/19/2006  . HYPERTENSION 10/19/2006  . SWELLING MASS OR LUMP IN HEAD AND NECK 01/09/2010  . Prostate cancer 01/19/2012  . GERD (gastroesophageal reflux disease) 03/01/2012   Past Surgical History  Procedure Laterality Date  . Appendectomy      reports that he has quit smoking. He does not have any smokeless tobacco history on file. He reports that he does not drink alcohol. His drug history is not on file. family history includes Cancer in his other; Hypertension in his mother. No Known Allergies Current Outpatient Prescriptions on File Prior to Visit  Medication Sig Dispense Refill  . albuterol (PROVENTIL HFA;VENTOLIN HFA) 108 (90 BASE) MCG/ACT inhaler Inhale 2 puffs into the lungs 4 (four) times daily.  3 Inhaler  3  . allopurinol (ZYLOPRIM) 300 MG tablet TAKE 1 TABLET DAILY  90  tablet  2  . amLODipine-benazepril (LOTREL) 5-20 MG per capsule TAKE 1 CAPSULE DAILY  90 capsule  2  . aspirin 81 MG tablet Take 81 mg by mouth daily.        Marland Kitchen atorvastatin (LIPITOR) 10 MG tablet TAKE 1 TABLET DAILY  90 tablet  2  . montelukast (SINGULAIR) 10 MG tablet TAKE 1 TABLET (10MG  TOTAL ) BY MOUTH AT BEDTIME  90 tablet  2  . NEXIUM 40 MG capsule TAKE 1 CAPSULE DAILY  90 capsule  2  . Omega-3 Fatty Acids (FISH OIL) 500 MG CAPS Take by mouth daily.        . ONE TOUCH LANCETS MISC Apply 1 application topically daily. Use as directed  100 each  3  . ONE TOUCH ULTRA TEST test strip USE ONCE DAILY AS DIRECTED  100 each  11  . vardenafil (LEVITRA) 20 MG tablet 1 by mouth every other day as needed       . VIAGRA 100 MG tablet TAKE 1/2 TO 1 TABLET (50 TO 100MG ) BY MOUTH DAILY AS NEEDED FOR ERECTILE DYSFUNCTION  18 tablet  1   Current Facility-Administered Medications on File Prior to Visit  Medication Dose Route Frequency Provider Last Rate Last Dose  . pneumococcal 13-valent conjugate vaccine (PREVNAR 13) injection 0.5 mL  0.5 mL Intramuscular Tomorrow-1000 [name], MD        Review of Systems  Constitutional: Negative for unusual diaphoresis or other  sweats  HENT: Negative for ringing in ear Eyes: Negative for double vision or worsening visual disturbance.  Respiratory: Negative for choking and stridor.   Gastrointestinal: Negative for vomiting or other signifcant bowel change Genitourinary: Negative for hematuria or decreased urine volume.  Musculoskeletal: Negative for other MSK pain or swelling Skin: Negative for color change and worsening wound.  Neurological: Negative for tremors and numbness other than noted  Psychiatric/Behavioral: Negative for decreased concentration or agitation other than above        Objective:   Physical Exam BP 118/72  Pulse 73  Temp(Src) 98.4 F (36.9 C) (Oral)  Ht 5\' 5"  (1.651 m)  Wt 203 lb (92.08 kg)  BMI 33.78 kg/m2  SpO2 95% VS noted,    Constitutional: Pt appears well-developed, well-nourished.  HENT: Head: NCAT.  Right Ear: External ear normal.  Left Ear: External ear normal.  Eyes: . Pupils are equal, round, and reactive to light. Conjunctivae and EOM are normal Neck: Normal range of motion. Neck supple.  Cardiovascular: Normal rate and regular rhythm.   Pulmonary/Chest: Effort normal and breath sounds with decr BS, mild wheeze bilat  Abd:  Soft, NT, ND, + BS Neurological: Pt is alert. Not confused , motor grossly intact Skin: Skin is warm. No rash Psychiatric: Pt behavior is normal. No agitation.     Assessment & Plan:

## 2013-09-06 ENCOUNTER — Telehealth: Payer: Self-pay

## 2013-09-06 ENCOUNTER — Telehealth: Payer: Self-pay | Admitting: Internal Medicine

## 2013-09-06 MED ORDER — FLUTICASONE-SALMETEROL 250-50 MCG/DOSE IN AEPB: 1.0000 | INHALATION_SPRAY | Freq: Two times a day (BID) | RESPIRATORY_TRACT | Status: DC

## 2013-09-06 NOTE — Telephone Encounter (Signed)
Called the patient informed of change.  Patient understood and agreed to change.

## 2013-09-06 NOTE — Telephone Encounter (Signed)
[facility] request to change Symbicort to a covered alternative.  Advair Diskus 60's or Advair HFA INH. Advise

## 2013-09-06 NOTE — Telephone Encounter (Signed)
Ok to change to advair  Shirlean Mylar to inform pt of required change due to his insurance coverage please

## 2013-09-06 NOTE — Telephone Encounter (Signed)
Relevant patient education assigned to patient using Emmi. ° °

## 2013-10-04 DIAGNOSIS — C61 Malignant neoplasm of prostate: Secondary | ICD-10-CM | POA: Diagnosis not present

## 2013-10-25 ENCOUNTER — Telehealth: Payer: Self-pay | Admitting: Internal Medicine

## 2013-10-25 NOTE — Telephone Encounter (Signed)
Pt came by to request Rx for glucose strips. Pt states the pharmacy said they no longer provide the one touch test strips. Pt is seeking alternative test strip to be sent and a new meter that can read the new strips provided. Please contact pt when request is reviewed.

## 2013-10-25 NOTE — Telephone Encounter (Signed)
Not sure how to respond to this, except to ask pt to ask his pharmacist about which meter and strips are covered by his insurance (I would not be able to know this) thanks

## 2013-10-26 MED ORDER — LANCETS MISC
Status: AC
Start: 2013-10-26 — End: ?

## 2013-10-26 MED ORDER — FREESTYLE FREEDOM LITE W/DEVICE KIT: PACK | Status: DC

## 2013-10-26 MED ORDER — GLUCOSE BLOOD VI STRP: ORAL_STRIP | Status: DC

## 2013-10-26 NOTE — Telephone Encounter (Signed)
Pt return call bck he stated the two monitor that is covered are Freestyle light & Precision Xtra...Johny Chess

## 2013-10-26 NOTE — Telephone Encounter (Signed)
Called the patient informed of md instructions.  The patient was not presently at home and will call back with the list he got from the pharmacy

## 2013-10-26 NOTE — Telephone Encounter (Signed)
Patient informed supplies sent to the pharmacy.

## 2013-10-26 NOTE — Telephone Encounter (Signed)
Monitor, supplies sent to pharmacy

## 2013-10-30 ENCOUNTER — Telehealth: Payer: Self-pay | Admitting: Internal Medicine

## 2013-10-30 NOTE — Telephone Encounter (Signed)
Pt request renew for free style light test strip to be send [facility]. Please advise.

## 2013-10-30 NOTE — Telephone Encounter (Signed)
[phone] and reference # [id number]. If there is any question please cone that number.

## 2013-10-31 MED ORDER — GLUCOSE BLOOD VI STRP: ORAL_STRIP | Status: DC

## 2013-10-31 NOTE — Telephone Encounter (Signed)
Refill done.  

## 2013-11-03 DIAGNOSIS — C61 Malignant neoplasm of prostate: Secondary | ICD-10-CM | POA: Diagnosis not present

## 2013-11-03 DIAGNOSIS — N139 Obstructive and reflux uropathy, unspecified: Secondary | ICD-10-CM | POA: Diagnosis not present

## 2013-11-03 DIAGNOSIS — N401 Enlarged prostate with lower urinary tract symptoms: Secondary | ICD-10-CM | POA: Diagnosis not present

## 2013-12-06 ENCOUNTER — Other Ambulatory Visit: Payer: Self-pay

## 2013-12-06 MED ORDER — GLUCOSE BLOOD VI STRP: ORAL_STRIP | Status: DC

## 2014-01-17 ENCOUNTER — Other Ambulatory Visit (INDEPENDENT_AMBULATORY_CARE_PROVIDER_SITE_OTHER): Payer: Medicare Other

## 2014-01-17 ENCOUNTER — Encounter: Payer: Self-pay | Admitting: Internal Medicine

## 2014-01-17 ENCOUNTER — Ambulatory Visit (INDEPENDENT_AMBULATORY_CARE_PROVIDER_SITE_OTHER): Payer: Medicare Other | Admitting: Internal Medicine

## 2014-01-17 VITALS — BP 162/78 | HR 107 | Temp 98.3°F | Ht 65.0 in | Wt 204.2 lb

## 2014-01-17 DIAGNOSIS — E785 Hyperlipidemia, unspecified: Secondary | ICD-10-CM | POA: Diagnosis not present

## 2014-01-17 DIAGNOSIS — I10 Essential (primary) hypertension: Secondary | ICD-10-CM | POA: Diagnosis not present

## 2014-01-17 DIAGNOSIS — E119 Type 2 diabetes mellitus without complications: Secondary | ICD-10-CM | POA: Diagnosis not present

## 2014-01-17 LAB — HEMOGLOBIN A1C: Hgb A1c MFr Bld: 7 % — ABNORMAL HIGH (ref 4.6–6.5)

## 2014-01-17 LAB — LIPID PANEL
Cholesterol: 143 mg/dL (ref 0–200)
HDL: 52.1 mg/dL (ref >39.00)
LDL Cholesterol: 84 mg/dL (ref 0–99)
NonHDL: 90.9
Total CHOL/HDL Ratio: 3
Triglycerides: 36 mg/dL (ref 0.0–149.0)
VLDL: 7.2 mg/dL (ref 0.0–40.0)

## 2014-01-17 LAB — HEPATIC FUNCTION PANEL
ALT: 21 U/L (ref 0–53)
AST: 23 U/L (ref 0–37)
Albumin: 3.8 g/dL (ref 3.5–5.2)
Alkaline Phosphatase: 67 U/L (ref 39–117)
Bilirubin, Direct: 0.2 mg/dL (ref 0.0–0.3)
Total Bilirubin: 0.9 mg/dL (ref 0.2–1.2)
Total Protein: 7.3 g/dL (ref 6.0–8.3)

## 2014-01-17 LAB — BASIC METABOLIC PANEL
BUN: 14 mg/dL (ref 6–23)
CO2: 28 mEq/L (ref 19–32)
Calcium: 9.5 mg/dL (ref 8.4–10.5)
Chloride: 101 mEq/L (ref 96–112)
Creatinine, Ser: 1.1 mg/dL (ref 0.4–1.5)
GFR: 83.87 mL/min (ref >60.00)
Glucose, Bld: 151 mg/dL — ABNORMAL HIGH (ref 70–99)
Potassium: 4.1 mEq/L (ref 3.5–5.1)
Sodium: 139 mEq/L (ref 135–145)

## 2014-01-17 MED ORDER — GLUCOSE BLOOD VI STRP: ORAL_STRIP | Status: DC

## 2014-01-17 MED ORDER — AMLODIPINE BESY-BENAZEPRIL HCL 10-20 MG PO CAPS: 1.0000 | ORAL_CAPSULE | Freq: Every day | ORAL | Status: DC

## 2014-01-17 NOTE — Assessment & Plan Note (Signed)
stable overall by history and exam, recent data reviewed with pt, and pt to continue medical treatment as before,  to f/u any worsening symptoms or concerns BP Readings from Last 3 Encounters:  01/17/14 162/78  09/05/13 118/72  03/07/13 120/78

## 2014-01-17 NOTE — Addendum Note (Signed)
Addended by: Sharon Seller B on: 01/17/2014 03:28 PM   Modules accepted: Orders

## 2014-01-17 NOTE — Assessment & Plan Note (Signed)
stable overall by history and exam, recent data reviewed with pt, and pt to continue medical treatment as before,  to f/u any worsening symptoms or concerns Lab Results  Component Value Date   HGBA1C 6.2 03/07/2013   For lab f/u with recent mild wt gain

## 2014-01-17 NOTE — Progress Notes (Signed)
Pre visit review using our clinic review tool, if applicable. No additional management support is needed unless otherwise documented below in the visit note. 

## 2014-01-17 NOTE — Progress Notes (Signed)
Subjective:    Patient ID: Allen Hunter., male    DOB: 1941/01/12, 73 y.o.   MRN: [id number]  HPI  Here to f/u; overall doing ok,  Pt denies chest pain, increased sob or doe, wheezing, orthopnea, PND, increased LE swelling, palpitations, dizziness or syncope.  Pt denies polydipsia, polyuria, or low sugar symptoms such as weakness or confusion improved with po intake.  Pt denies new neurological symptoms such as new headache, or facial or extremity weakness or numbness.   Pt states overall good compliance with meds, has been trying to follow lower cholesterol, diabetic diet, with wt overall stable,  but little exercise however- plans to try to get back to more exercise liek before recent wt gain.  Has been trying the nutrisystem with more salt intake, Overall good compliance with treatment, and good medicine tolerability.  Gaines 7 lbs since dec 2014 Past Medical History  Diagnosis Date  . ALLERGIC RHINITIS 10/04/2008  . ASTHMA 10/04/2008  . BACK PAIN 04/18/2008  . BENIGN PROSTATIC HYPERTROPHY 03/09/2007  . COLONIC POLYPS, HX OF 03/09/2007  . COPD 10/20/2007  . DIABETES MELLITUS, TYPE II 03/09/2007  . FATIGUE 10/05/2007  . GOUT 10/19/2006  . HYPERLIPIDEMIA 10/19/2006  . HYPERTENSION 10/19/2006  . SWELLING MASS OR LUMP IN HEAD AND NECK 01/09/2010  . Prostate cancer 01/19/2012  . GERD (gastroesophageal reflux disease) 03/01/2012   Past Surgical History  Procedure Laterality Date  . Appendectomy      reports that he has quit smoking. He does not have any smokeless tobacco history on file. He reports that he does not drink alcohol. His drug history is not on file. family history includes Cancer in his other; Hypertension in his mother. No Known Allergies Current Outpatient Prescriptions on File Prior to Visit  Medication Sig Dispense Refill  . albuterol (PROVENTIL HFA;VENTOLIN HFA) 108 (90 BASE) MCG/ACT inhaler Inhale 2 puffs into the lungs 4 (four) times daily.  3 Inhaler  3  . allopurinol  (ZYLOPRIM) 300 MG tablet TAKE 1 TABLET DAILY  90 tablet  2  . amLODipine-benazepril (LOTREL) 5-20 MG per capsule TAKE 1 CAPSULE DAILY  90 capsule  2  . aspirin 81 MG tablet Take 81 mg by mouth daily.        Marland Kitchen atorvastatin (LIPITOR) 10 MG tablet TAKE 1 TABLET DAILY  90 tablet  2  . Blood Glucose Monitoring Suppl (FREESTYLE FREEDOM LITE) W/DEVICE KIT Use asd, 1 device, 250.02  1 each  0  . Fluticasone-Salmeterol (ADVAIR DISKUS) 250-50 MCG/DOSE AEPB Inhale 1 puff into the lungs 2 (two) times daily.  3 each  3  . glucose blood (FREESTYLE LITE) test strip Use as directed once daily to check blood sugar.  Diagnosis code 250.02  100 each  11  . glucose blood (ONE TOUCH ULTRA TEST) test strip Use as directed once daily to check blood sugar.  Diagnosis code 250.00.      Marland Kitchen Lancets MISC Use asd 1 per day 250.02  100 each  11  . montelukast (SINGULAIR) 10 MG tablet TAKE 1 TABLET (10MG TOTAL ) BY MOUTH AT BEDTIME  90 tablet  2  . NEXIUM 40 MG capsule TAKE 1 CAPSULE DAILY  90 capsule  2  . Omega-3 Fatty Acids (FISH OIL) 500 MG CAPS Take by mouth daily.        . sildenafil (VIAGRA) 100 MG tablet TAKE 1/2 TO 1 TABLET (50 TO 100MG) BY MOUTH DAILY AS NEEDED FOR ERECTILE DYSFUNCTION  18 tablet  5  . vardenafil (LEVITRA) 20 MG tablet 1 by mouth every other day as needed        Current Facility-Administered Medications on File Prior to Visit  Medication Dose Route Frequency Provider Last Rate Last Dose  . pneumococcal 13-valent conjugate vaccine (PREVNAR 13) injection 0.5 mL  0.5 mL Intramuscular Tomorrow-1000 [name], MD       Review of Systems  Constitutional: Negative for unusual diaphoresis or other sweats  HENT: Negative for ringing in ear Eyes: Negative for double vision or worsening visual disturbance.  Respiratory: Negative for choking and stridor.   Gastrointestinal: Negative for vomiting or other signifcant bowel change Genitourinary: Negative for hematuria or decreased urine volume.    Musculoskeletal: Negative for other MSK pain or swelling Skin: Negative for color change and worsening wound.  Neurological: Negative for tremors and numbness other than noted  Psychiatric/Behavioral: Negative for decreased concentration or agitation other than above       Objective:   Physical Exam BP 162/78  Pulse 107  Temp(Src) 98.3 F (36.8 C) (Oral)  Ht '5\' 5"'  (1.651 m)  Wt 204 lb 4 oz (92.647 kg)  BMI 33.99 kg/m2  SpO2 93% VS noted,  Constitutional: Pt appears well-developed, well-nourished.  HENT: Head: NCAT.  Right Ear: External ear normal.  Left Ear: External ear normal.  Eyes: . Pupils are equal, round, and reactive to light. Conjunctivae and EOM are normal Neck: Normal range of motion. Neck supple.  Cardiovascular: Normal rate and regular rhythm.   Pulmonary/Chest: Effort normal and breath sounds normal.  Abd:  Soft, NT, ND, + BS Neurological: Pt is alert. Not confused , motor grossly intact Skin: Skin is warm. No rash Psychiatric: Pt behavior is normal. No agitation.  Wt Readings from Last 3 Encounters:  01/17/14 204 lb 4 oz (92.647 kg)  09/05/13 203 lb (92.08 kg)  03/07/13 197 lb (89.359 kg)       Assessment & Plan:

## 2014-01-17 NOTE — Assessment & Plan Note (Signed)
stable overall by history and exam, recent data reviewed with pt, and pt to continue medical treatment as before,  to f/u any worsening symptoms or concerns Lab Results  Component Value Date   LDLCALC 77 03/07/2013

## 2014-01-17 NOTE — Patient Instructions (Signed)
OK to increase the Lotrel to 10/20 mg - 1 per day  Please continue all other medications as before, and refills have been done if requested.  Please have the pharmacy call with any other refills you may need.  Please continue your efforts at being more active, low cholesterol diet, and weight control.  You are otherwise up to date with prevention measures today.  Please keep your appointments with your specialists as you may have planned  Please go to the LAB in the Basement (turn left off the elevator) for the tests to be done today  You will be contacted by phone if any changes need to be made immediately.  Otherwise, you will receive a letter about your results with an explanation, but please check with MyChart first.  Please remember to sign up for MyChart if you have not done so, as this will be important to you in the future with finding out test results, communicating by private email, and scheduling acute appointments online when needed.  Please return in 6 months, or sooner if needed

## 2014-03-06 ENCOUNTER — Other Ambulatory Visit: Payer: Self-pay | Admitting: Internal Medicine

## 2014-03-07 ENCOUNTER — Other Ambulatory Visit: Payer: Self-pay

## 2014-03-07 MED ORDER — ESOMEPRAZOLE MAGNESIUM 40 MG PO CPDR: 40.0000 mg | DELAYED_RELEASE_CAPSULE | Freq: Every day | ORAL | Status: DC

## 2014-03-08 ENCOUNTER — Ambulatory Visit: Payer: Medicare Other | Admitting: Internal Medicine

## 2014-04-10 ENCOUNTER — Encounter: Payer: Self-pay | Admitting: Internal Medicine

## 2014-04-10 ENCOUNTER — Other Ambulatory Visit (INDEPENDENT_AMBULATORY_CARE_PROVIDER_SITE_OTHER): Payer: Medicare Other

## 2014-04-10 ENCOUNTER — Ambulatory Visit (INDEPENDENT_AMBULATORY_CARE_PROVIDER_SITE_OTHER): Payer: Medicare Other | Admitting: Internal Medicine

## 2014-04-10 VITALS — BP 142/70 | HR 68 | Temp 97.8°F | Ht 65.0 in | Wt 199.2 lb

## 2014-04-10 DIAGNOSIS — C61 Malignant neoplasm of prostate: Secondary | ICD-10-CM | POA: Diagnosis not present

## 2014-04-10 DIAGNOSIS — E119 Type 2 diabetes mellitus without complications: Secondary | ICD-10-CM

## 2014-04-10 DIAGNOSIS — I10 Essential (primary) hypertension: Secondary | ICD-10-CM

## 2014-04-10 DIAGNOSIS — J4521 Mild intermittent asthma with (acute) exacerbation: Secondary | ICD-10-CM | POA: Diagnosis not present

## 2014-04-10 DIAGNOSIS — J45901 Unspecified asthma with (acute) exacerbation: Secondary | ICD-10-CM | POA: Insufficient documentation

## 2014-04-10 DIAGNOSIS — E785 Hyperlipidemia, unspecified: Secondary | ICD-10-CM

## 2014-04-10 LAB — CBC WITH DIFFERENTIAL/PLATELET
Basophils Absolute: 0 10*3/uL (ref 0.0–0.1)
Basophils Relative: 0.7 % (ref 0.0–3.0)
Eosinophils Absolute: 0.1 10*3/uL (ref 0.0–0.7)
Eosinophils Relative: 3.1 % (ref 0.0–5.0)
HCT: 42.4 % (ref 39.0–52.0)
Hemoglobin: 13.6 g/dL (ref 13.0–17.0)
Lymphocytes Relative: 39 % (ref 12.0–46.0)
Lymphs Abs: 1.6 10*3/uL (ref 0.7–4.0)
MCHC: 32.2 g/dL (ref 30.0–36.0)
MCV: 92.9 fl (ref 78.0–100.0)
Monocytes Absolute: 0.3 10*3/uL (ref 0.1–1.0)
Monocytes Relative: 8 % (ref 3.0–12.0)
Neutro Abs: 2 10*3/uL (ref 1.4–7.7)
Neutrophils Relative %: 49.2 % (ref 43.0–77.0)
Platelets: 191 10*3/uL (ref 150.0–400.0)
RBC: 4.56 Mil/uL (ref 4.22–5.81)
RDW: 15.1 % (ref 11.5–15.5)
WBC: 4.1 10*3/uL (ref 4.0–10.5)

## 2014-04-10 LAB — HEMOGLOBIN A1C: Hgb A1c MFr Bld: 6.3 % (ref 4.6–6.5)

## 2014-04-10 MED ORDER — LEVOFLOXACIN 250 MG PO TABS: 250.0000 mg | ORAL_TABLET | Freq: Every day | ORAL | Status: DC

## 2014-04-10 MED ORDER — PREDNISONE 10 MG PO TABS: ORAL_TABLET | ORAL | Status: DC

## 2014-04-10 NOTE — Progress Notes (Signed)
Subjective:    Patient ID: Allen Dakin., male    DOB: Mar 16, 1941, 74 y.o.   MRN: [id number]    HPI  Here to f/u; overall doing ok,  Pt denies chest pain, increased sob or doe, orthopnea, PND, increased LE swelling, palpitations, dizziness or syncope, except for onset wheezing/sob in the past wk with increased inhaler use..  Pt denies polydipsia, polyuria, or low sugar symptoms such as weakness or confusion improved with po intake.  Pt denies new neurological symptoms such as new headache, or facial or extremity weakness or numbness.   Pt states overall good compliance with meds, has been trying to follow lower cholesterol, diabetic diet, with wt overall stable,  but little exercise however, trying to be more active and better diet since last a1c jumped to 7.0  Wt Readings from Last 3 Encounters:  04/10/14 199 lb 4 oz (90.379 kg)  01/17/14 204 lb 4 oz (92.647 kg)  09/05/13 203 lb (92.08 kg)    Past Medical History  Diagnosis Date  . ALLERGIC RHINITIS 10/04/2008  . ASTHMA 10/04/2008  . BACK PAIN 04/18/2008  . BENIGN PROSTATIC HYPERTROPHY 03/09/2007  . COLONIC POLYPS, HX OF 03/09/2007  . COPD 10/20/2007  . DIABETES MELLITUS, TYPE II 03/09/2007  . FATIGUE 10/05/2007  . GOUT 10/19/2006  . HYPERLIPIDEMIA 10/19/2006  . HYPERTENSION 10/19/2006  . SWELLING MASS OR LUMP IN HEAD AND NECK 01/09/2010  . Prostate cancer 01/19/2012  . GERD (gastroesophageal reflux disease) 03/01/2012   Past Surgical History  Procedure Laterality Date  . Appendectomy      reports that he has quit smoking. He does not have any smokeless tobacco history on file. He reports that he does not drink alcohol. His drug history is not on file. family history includes Cancer in his other; Hypertension in his mother. No Known Allergies Current Outpatient Prescriptions on File Prior to Visit  Medication Sig Dispense Refill  . albuterol (PROVENTIL HFA;VENTOLIN HFA) 108 (90 BASE) MCG/ACT inhaler Inhale 2 puffs into the lungs 4  (four) times daily. 3 Inhaler 3  . allopurinol (ZYLOPRIM) 300 MG tablet TAKE 1 TABLET DAILY 90 tablet 1  . amLODipine-benazepril (LOTREL) 10-20 MG per capsule Take 1 capsule by mouth daily. 90 capsule 3  . amLODipine-benazepril (LOTREL) 5-20 MG per capsule TAKE 1 CAPSULE DAILY 90 capsule 1  . aspirin 81 MG tablet Take 81 mg by mouth daily.      Marland Kitchen atorvastatin (LIPITOR) 10 MG tablet TAKE 1 TABLET DAILY 90 tablet 2  . Blood Glucose Monitoring Suppl (FREESTYLE FREEDOM LITE) W/DEVICE KIT Use asd, 1 device, 250.02 1 each 0  . esomeprazole (NEXIUM) 40 MG capsule Take 1 capsule (40 mg total) by mouth daily. 90 capsule 3  . Fluticasone-Salmeterol (ADVAIR DISKUS) 250-50 MCG/DOSE AEPB Inhale 1 puff into the lungs 2 (two) times daily. 3 each 3  . glucose blood (FREESTYLE LITE) test strip Use as directed once daily to check blood sugar.  Diagnosis code 250.02 100 each 11  . glucose blood (ONE TOUCH ULTRA TEST) test strip Use as directed once daily to check blood sugar.  ICD E11.9 300 each 3  . Lancets MISC Use asd 1 per day 250.02 100 each 11  . montelukast (SINGULAIR) 10 MG tablet TAKE 1 TABLET (10MG TOTAL ) BY MOUTH AT BEDTIME 90 tablet 2  . Omega-3 Fatty Acids (FISH OIL) 500 MG CAPS Take by mouth daily.      . sildenafil (VIAGRA) 100 MG tablet TAKE 1/2 TO  1 TABLET (50 TO 100MG) BY MOUTH DAILY AS NEEDED FOR ERECTILE DYSFUNCTION 18 tablet 5  . vardenafil (LEVITRA) 20 MG tablet 1 by mouth every other day as needed      No current facility-administered medications on file prior to visit.   Review of Systems  Constitutional: Negative for unusual diaphoresis or other sweats  HENT: Negative for ringing in ear Eyes: Negative for double vision or worsening visual disturbance.  Respiratory: Negative for choking and stridor.   Gastrointestinal: Negative for vomiting or other signifcant bowel change Genitourinary: Negative for hematuria or decreased urine volume.  Musculoskeletal: Negative for other MSK pain or  swelling Skin: Negative for color change and worsening wound.  Neurological: Negative for tremors and numbness other than noted  Psychiatric/Behavioral: Negative for decreased concentration or agitation other than above       Objective:   Physical Exam BP 142/70 mmHg  Pulse 68  Temp(Src) 97.8 F (36.6 C) (Oral)  Ht 5' 5" (1.651 m)  Wt 199 lb 4 oz (90.379 kg)  BMI 33.16 kg/m2  SpO2 96% VS noted,  Constitutional: Pt appears well-developed, well-nourished.  HENT: Head: NCAT.  Right Ear: External ear normal.  Left Ear: External ear normal.  Eyes: . Pupils are equal, round, and reactive to light. Conjunctivae and EOM are normal Neck: Normal range of motion. Neck supple.  Cardiovascular: Normal rate and regular rhythm.   Pulmonary/Chest: Effort normal and breath sounds without rales but + mild diffue wheezing.  Neurological: Pt is alert. Not confused , motor grossly intact Skin: Skin is warm. No rash Psychiatric: Pt behavior is normal. No agitation.     Assessment & Plan:

## 2014-04-10 NOTE — Assessment & Plan Note (Signed)
stable overall by history and exam, recent data reviewed with pt, and pt to continue medical treatment as before,  to f/u any worsening symptoms or concerns Lab Results  Component Value Date   LDLCALC 84 01/17/2014

## 2014-04-10 NOTE — Patient Instructions (Signed)
Please take all new medication as prescribed - the antibiotic and prednisone  Please continue all other medications as before, and refills have been done if requested.  Please have the pharmacy call with any other refills you may need.  Please continue your efforts at being more active, low cholesterol diet, and weight control.  You are otherwise up to date with prevention measures today.  Please keep your appointments with your specialists as you may have planned  Please go to the LAB in the Basement (turn left off the elevator) for the tests to be done today  You will be contacted by phone if any changes need to be made immediately.  Otherwise, you will receive a letter about your results with an explanation, but please check with MyChart first.  Please remember to sign up for MyChart if you have not done so, as this will be important to you in the future with finding out test results, communicating by private email, and scheduling acute appointments online when needed.  Please return in 6 months, or sooner if needed

## 2014-04-10 NOTE — Progress Notes (Signed)
Pre visit review using our clinic review tool, if applicable. No additional management support is needed unless otherwise documented below in the visit note. 

## 2014-04-10 NOTE — Assessment & Plan Note (Signed)
stable overall by history and exam, recent data reviewed with pt, and pt to continue medical treatment as before,  to f/u any worsening symptoms or concerns BP Readings from Last 3 Encounters:  04/10/14 142/70  01/17/14 162/78  09/05/13 118/72

## 2014-04-10 NOTE — Assessment & Plan Note (Addendum)
Mild to mod, for antibiotic, and predpac asd,  to f/u any worsening symptoms or concerns

## 2014-04-10 NOTE — Assessment & Plan Note (Signed)
stable overall by history and exam, recent data reviewed with pt, and pt to continue medical treatment as before,  to f/u any worsening symptoms or concerns Lab Results  Component Value Date   HGBA1C 7.0* 01/17/2014

## 2014-04-11 ENCOUNTER — Other Ambulatory Visit: Payer: Self-pay

## 2014-04-11 LAB — MICROALBUMIN / CREATININE URINE RATIO
Creatinine,U: 20.6 mg/dL
Microalb Creat Ratio: 1.5 mg/g (ref 0.0–30.0)
Microalb, Ur: 0.3 mg/dL (ref 0.0–1.9)

## 2014-04-11 LAB — URINALYSIS, ROUTINE W REFLEX MICROSCOPIC
Bilirubin Urine: NEGATIVE
Hgb urine dipstick: NEGATIVE
Ketones, ur: NEGATIVE
Leukocytes, UA: NEGATIVE
Nitrite: NEGATIVE
RBC / HPF: NONE SEEN (ref 0–?)
Specific Gravity, Urine: <=1.005 — AB (ref 1.000–1.030)
Total Protein, Urine: NEGATIVE
Urine Glucose: NEGATIVE
Urobilinogen, UA: 0.2 (ref 0.0–1.0)
WBC, UA: NONE SEEN (ref 0–?)
pH: 7 (ref 5.0–8.0)

## 2014-04-11 LAB — BASIC METABOLIC PANEL
BUN: 15 mg/dL (ref 6–23)
CO2: 31 mEq/L (ref 19–32)
Calcium: 9.5 mg/dL (ref 8.4–10.5)
Chloride: 104 mEq/L (ref 96–112)
Creatinine, Ser: 1.05 mg/dL (ref 0.40–1.50)
GFR: 88.44 mL/min (ref >60.00)
Glucose, Bld: 85 mg/dL (ref 70–99)
Potassium: 4.3 mEq/L (ref 3.5–5.1)
Sodium: 141 mEq/L (ref 135–145)

## 2014-04-11 LAB — HEPATIC FUNCTION PANEL
ALT: 18 U/L (ref 0–53)
AST: 21 U/L (ref 0–37)
Albumin: 4.1 g/dL (ref 3.5–5.2)
Alkaline Phosphatase: 51 U/L (ref 39–117)
Bilirubin, Direct: 0.1 mg/dL (ref 0.0–0.3)
Total Bilirubin: 0.4 mg/dL (ref 0.2–1.2)
Total Protein: 6.6 g/dL (ref 6.0–8.3)

## 2014-04-11 LAB — TSH: TSH: 1.5 u[IU]/mL (ref 0.35–4.50)

## 2014-04-11 LAB — LIPID PANEL
Cholesterol: 130 mg/dL (ref 0–200)
HDL: 52.8 mg/dL (ref >39.00)
LDL Cholesterol: 69 mg/dL (ref 0–99)
NonHDL: 77.2
Total CHOL/HDL Ratio: 2
Triglycerides: 43 mg/dL (ref 0.0–149.0)
VLDL: 8.6 mg/dL (ref 0.0–40.0)

## 2014-04-11 MED ORDER — AMLODIPINE BESY-BENAZEPRIL HCL 5-20 MG PO CAPS: 1.0000 | ORAL_CAPSULE | Freq: Every day | ORAL | Status: DC

## 2014-04-18 DIAGNOSIS — R3916 Straining to void: Secondary | ICD-10-CM | POA: Diagnosis not present

## 2014-04-18 DIAGNOSIS — C61 Malignant neoplasm of prostate: Secondary | ICD-10-CM | POA: Diagnosis not present

## 2014-04-18 DIAGNOSIS — N401 Enlarged prostate with lower urinary tract symptoms: Secondary | ICD-10-CM | POA: Diagnosis not present

## 2014-04-27 ENCOUNTER — Other Ambulatory Visit: Payer: Self-pay | Admitting: Internal Medicine

## 2014-07-19 ENCOUNTER — Encounter: Payer: Self-pay | Admitting: Internal Medicine

## 2014-07-19 ENCOUNTER — Ambulatory Visit (INDEPENDENT_AMBULATORY_CARE_PROVIDER_SITE_OTHER): Payer: Medicare Other | Admitting: Internal Medicine

## 2014-07-19 VITALS — BP 126/80 | HR 72 | Temp 98.1°F | Resp 18 | Ht 65.0 in | Wt 197.0 lb

## 2014-07-19 DIAGNOSIS — J4520 Mild intermittent asthma, uncomplicated: Secondary | ICD-10-CM

## 2014-07-19 DIAGNOSIS — I10 Essential (primary) hypertension: Secondary | ICD-10-CM

## 2014-07-19 DIAGNOSIS — E119 Type 2 diabetes mellitus without complications: Secondary | ICD-10-CM

## 2014-07-19 DIAGNOSIS — E785 Hyperlipidemia, unspecified: Secondary | ICD-10-CM

## 2014-07-19 NOTE — Progress Notes (Signed)
Pre visit review using our clinic review tool, if applicable. No additional management support is needed unless otherwise documented below in the visit note. 0.

## 2014-07-19 NOTE — Patient Instructions (Signed)
Please continue all other medications as before, and refills have been done if requested.  Please have the pharmacy call with any other refills you may need.  Please continue your efforts at being more active, low cholesterol diet, and weight control.  You are otherwise up to date with prevention measures today.  Please keep your appointments with your specialists as you may have planned  Please return in 6 months, or sooner if needed 

## 2014-07-19 NOTE — Assessment & Plan Note (Signed)
stable overall by history and exam, recent data reviewed with pt, and pt to continue medical treatment as before,  to f/u any worsening symptoms or concerns Lab Results  Component Value Date   LDLCALC 69 04/10/2014

## 2014-07-19 NOTE — Assessment & Plan Note (Signed)
stable overall by history and exam, recent data reviewed with pt, and pt to continue medical treatment as before,  to f/u any worsening symptoms or concerns BP Readings from Last 3 Encounters:  07/19/14 126/80  04/10/14 142/70  01/17/14 162/78

## 2014-07-19 NOTE — Assessment & Plan Note (Signed)
stable overall by history and exam, recent data reviewed with pt, and pt to continue medical treatment as before,  to f/u any worsening symptoms or concerns Lab Results  Component Value Date   HGBA1C 6.3 04/10/2014

## 2014-07-19 NOTE — Progress Notes (Signed)
Subjective:    Patient ID: Allen Hunter., male    DOB: [birth year], 74 y.o.   MRN: [id number]  HPI  Here for yearly f/u;  Overall doing ok;  Pt denies Chest pain, worsening SOB, DOE, wheezing, orthopnea, PND, worsening LE edema, palpitations, dizziness or syncope.  Pt denies neurological change such as new headache, facial or extremity weakness.  Pt denies polydipsia, polyuria, or low sugar symptoms. Pt states overall good compliance with treatment and medications, good tolerability, and has been trying to follow appropriate diet.  Pt denies worsening depressive symptoms, suicidal ideation or panic. No fever, night sweats, wt loss, loss of appetite, or other constitutional symptoms.  Pt states good ability with ADL's, has low fall risk, home safety reviewed and adequate, no other significant changes in hearing or vision, and only occasionally active with exercise. Overall asthma much improved on symbicort.  Does fast walks at 3-4 mph for 1 hour about 3-4 times per wk.  Has known prostate ca, for watchful waiting for now, has f/u aug3 with Dr Alinda Money, to consider repeat bisopy and need for tx if PSA or exam changes. Past Medical History  Diagnosis Date  . ALLERGIC RHINITIS 10/04/2008  . ASTHMA 10/04/2008  . BACK PAIN 04/18/2008  . BENIGN PROSTATIC HYPERTROPHY 03/09/2007  . COLONIC POLYPS, HX OF 03/09/2007  . COPD 10/20/2007  . DIABETES MELLITUS, TYPE II 03/09/2007  . FATIGUE 10/05/2007  . GOUT 10/19/2006  . HYPERLIPIDEMIA 10/19/2006  . HYPERTENSION 10/19/2006  . SWELLING MASS OR LUMP IN HEAD AND NECK 01/09/2010  . Prostate cancer 01/19/2012  . GERD (gastroesophageal reflux disease) 03/01/2012   Past Surgical History  Procedure Laterality Date  . Appendectomy      reports that he has quit smoking. He does not have any smokeless tobacco history on file. He reports that he does not drink alcohol. His drug history is not on file. family history includes Cancer in his other; Hypertension in his  mother. No Known Allergies Current Outpatient Prescriptions on File Prior to Visit  Medication Sig Dispense Refill  . albuterol (PROVENTIL HFA;VENTOLIN HFA) 108 (90 BASE) MCG/ACT inhaler Inhale 2 puffs into the lungs 4 (four) times daily. 3 Inhaler 3  . allopurinol (ZYLOPRIM) 300 MG tablet TAKE 1 TABLET DAILY 90 tablet 1  . amLODipine-benazepril (LOTREL) 10-20 MG per capsule Take 1 capsule by mouth daily. 90 capsule 3  . amLODipine-benazepril (LOTREL) 5-20 MG per capsule Take 1 capsule by mouth daily. 90 capsule 4  . aspirin 81 MG tablet Take 81 mg by mouth daily.      Marland Kitchen atorvastatin (LIPITOR) 10 MG tablet TAKE 1 TABLET DAILY 90 tablet 3  . Blood Glucose Monitoring Suppl (FREESTYLE FREEDOM LITE) W/DEVICE KIT Use asd, 1 device, 250.02 1 each 0  . esomeprazole (NEXIUM) 40 MG capsule Take 1 capsule (40 mg total) by mouth daily. 90 capsule 3  . Fluticasone-Salmeterol (ADVAIR DISKUS) 250-50 MCG/DOSE AEPB Inhale 1 puff into the lungs 2 (two) times daily. 3 each 3  . glucose blood (FREESTYLE LITE) test strip Use as directed once daily to check blood sugar.  Diagnosis code 250.02 100 each 11  . glucose blood (ONE TOUCH ULTRA TEST) test strip Use as directed once daily to check blood sugar.  ICD E11.9 300 each 3  . Lancets MISC Use asd 1 per day 250.02 100 each 11  . montelukast (SINGULAIR) 10 MG tablet TAKE 1 TABLET (10MG TOTAL ) BY MOUTH AT BEDTIME 90 tablet 2  .  Omega-3 Fatty Acids (FISH OIL) 500 MG CAPS Take by mouth daily.      . predniSONE (DELTASONE) 10 MG tablet 3 tabs by mouth per day for 3 days,2tabs per day for 3 days,1tab per day for 3 days 18 tablet 0  . sildenafil (VIAGRA) 100 MG tablet TAKE 1/2 TO 1 TABLET (50 TO 100MG) BY MOUTH DAILY AS NEEDED FOR ERECTILE DYSFUNCTION 18 tablet 5  . vardenafil (LEVITRA) 20 MG tablet 1 by mouth every other day as needed      No current facility-administered medications on file prior to visit.    Review of Systems  Constitutional: Negative for unusual  diaphoresis or night sweats HENT: Negative for ringing in ear or discharge Eyes: Negative for double vision or worsening visual disturbance.  Respiratory: Negative for choking and stridor.   Gastrointestinal: Negative for vomiting or other signifcant bowel change Genitourinary: Negative for hematuria or change in urine volume.  Musculoskeletal: Negative for other MSK pain or swelling Skin: Negative for color change and worsening wound.  Neurological: Negative for tremors and numbness other than noted  Psychiatric/Behavioral: Negative for decreased concentration or agitation other than above       Objective:   Physical Exam BP 126/80 mmHg  Pulse 72  Temp(Src) 98.1 F (36.7 C) (Oral)  Resp 18  Ht _0  (1.651 m)  Wt 197 lb (89.359 kg)  BMI 32.78 kg/m2  SpO2 95% VS noted,  Constitutional: Pt appears in no significant distress HENT: Head: NCAT.  Right Ear: External ear normal.  Left Ear: External ear normal.  Eyes: . Pupils are equal, round, and reactive to light. Conjunctivae and EOM are normal Neck: Normal range of motion. Neck supple.  Cardiovascular: Normal rate and regular rhythm.   Pulmonary/Chest: Effort normal and breath sounds without rales or wheezing.  Abd:  Soft, NT, ND, + BS Neurological: Pt is alert. Not confused , motor grossly intact Skin: Skin is warm. No rash, no LE edema Psychiatric: Pt behavior is normal. No agitation.     Assessment & Plan:

## 2014-07-19 NOTE — Assessment & Plan Note (Signed)
stable overall by history and exam, recent data reviewed with pt, and pt to continue medical treatment as before,  to f/u any worsening symptoms or concerns SpO2 Readings from Last 3 Encounters:  07/19/14 95%  04/10/14 96%  01/17/14 93%

## 2014-07-20 ENCOUNTER — Telehealth: Payer: Self-pay | Admitting: Internal Medicine

## 2014-08-28 ENCOUNTER — Encounter: Payer: Self-pay | Admitting: Gastroenterology

## 2014-10-02 ENCOUNTER — Other Ambulatory Visit: Payer: Self-pay

## 2014-10-02 MED ORDER — AMLODIPINE BESY-BENAZEPRIL HCL 5-20 MG PO CAPS: 1.0000 | ORAL_CAPSULE | Freq: Every day | ORAL | Status: DC

## 2014-10-24 DIAGNOSIS — C61 Malignant neoplasm of prostate: Secondary | ICD-10-CM | POA: Diagnosis not present

## 2014-10-31 DIAGNOSIS — R3916 Straining to void: Secondary | ICD-10-CM | POA: Diagnosis not present

## 2014-10-31 DIAGNOSIS — C61 Malignant neoplasm of prostate: Secondary | ICD-10-CM | POA: Diagnosis not present

## 2014-10-31 DIAGNOSIS — N401 Enlarged prostate with lower urinary tract symptoms: Secondary | ICD-10-CM | POA: Diagnosis not present

## 2014-11-07 ENCOUNTER — Other Ambulatory Visit: Payer: Self-pay | Admitting: Internal Medicine

## 2015-01-04 ENCOUNTER — Other Ambulatory Visit: Payer: Self-pay | Admitting: Internal Medicine

## 2015-01-18 ENCOUNTER — Ambulatory Visit (INDEPENDENT_AMBULATORY_CARE_PROVIDER_SITE_OTHER): Payer: Medicare Other | Admitting: Internal Medicine

## 2015-01-18 ENCOUNTER — Other Ambulatory Visit (INDEPENDENT_AMBULATORY_CARE_PROVIDER_SITE_OTHER): Payer: Medicare Other

## 2015-01-18 ENCOUNTER — Encounter: Payer: Self-pay | Admitting: Internal Medicine

## 2015-01-18 VITALS — BP 120/76 | HR 69 | Temp 97.9°F | Ht 65.0 in | Wt 201.0 lb

## 2015-01-18 DIAGNOSIS — E785 Hyperlipidemia, unspecified: Secondary | ICD-10-CM

## 2015-01-18 DIAGNOSIS — Z23 Encounter for immunization: Secondary | ICD-10-CM | POA: Diagnosis not present

## 2015-01-18 DIAGNOSIS — E119 Type 2 diabetes mellitus without complications: Secondary | ICD-10-CM | POA: Diagnosis not present

## 2015-01-18 DIAGNOSIS — I10 Essential (primary) hypertension: Secondary | ICD-10-CM | POA: Diagnosis not present

## 2015-01-18 DIAGNOSIS — C61 Malignant neoplasm of prostate: Secondary | ICD-10-CM

## 2015-01-18 LAB — BASIC METABOLIC PANEL
BUN: 17 mg/dL (ref 6–23)
CO2: 30 mEq/L (ref 19–32)
Calcium: 9.2 mg/dL (ref 8.4–10.5)
Chloride: 104 mEq/L (ref 96–112)
Creatinine, Ser: 1.13 mg/dL (ref 0.40–1.50)
GFR: 81.08 mL/min (ref >60.00)
Glucose, Bld: 107 mg/dL — ABNORMAL HIGH (ref 70–99)
Potassium: 4.4 mEq/L (ref 3.5–5.1)
Sodium: 142 mEq/L (ref 135–145)

## 2015-01-18 LAB — HEMOGLOBIN A1C: Hgb A1c MFr Bld: 6.5 % (ref 4.6–6.5)

## 2015-01-18 LAB — HEPATIC FUNCTION PANEL
ALT: 19 U/L (ref 0–53)
AST: 21 U/L (ref 0–37)
Albumin: 4.1 g/dL (ref 3.5–5.2)
Alkaline Phosphatase: 60 U/L (ref 39–117)
Bilirubin, Direct: 0.2 mg/dL (ref 0.0–0.3)
Total Bilirubin: 0.7 mg/dL (ref 0.2–1.2)
Total Protein: 6.7 g/dL (ref 6.0–8.3)

## 2015-01-18 LAB — LIPID PANEL
Cholesterol: 118 mg/dL (ref 0–200)
HDL: 47.2 mg/dL (ref >39.00)
LDL Cholesterol: 64 mg/dL (ref 0–99)
NonHDL: 71.14
Total CHOL/HDL Ratio: 3
Triglycerides: 36 mg/dL (ref 0.0–149.0)
VLDL: 7.2 mg/dL (ref 0.0–40.0)

## 2015-01-18 NOTE — Assessment & Plan Note (Signed)
stable overall by history and exam, recent data reviewed with pt, and pt to continue medical treatment as before,  to f/u any worsening symptoms or concerns BP Readings from Last 3 Encounters:  01/18/15 120/76  07/19/14 126/80  04/10/14 142/70

## 2015-01-18 NOTE — Progress Notes (Signed)
Subjective:    Patient ID: Allen Dakin., male    DOB: 01-15-1941, 74 y.o.   MRN: [id number]  HPI  Here to f/u; overall doing ok,  Pt denies chest pain, increasing sob or doe, wheezing, orthopnea, PND, increased LE swelling, palpitations, dizziness or syncope.  Pt denies new neurological symptoms such as new headache, or facial or extremity weakness or numbness.  Pt denies polydipsia, polyuria, or low sugar episode.   Pt denies new neurological symptoms such as new headache, or facial or extremity weakness or numbness.   Pt states overall good compliance with meds, mostly trying to follow appropriate diet, with wt overall stable,  but little exercise however. No current complaints Wt Readings from Last 3 Encounters:  01/18/15 201 lb (91.173 kg)  07/19/14 197 lb (89.359 kg)  04/10/14 199 lb 4 oz (90.379 kg)   Past Medical History  Diagnosis Date  . ALLERGIC RHINITIS 10/04/2008  . ASTHMA 10/04/2008  . BACK PAIN 04/18/2008  . BENIGN PROSTATIC HYPERTROPHY 03/09/2007  . COLONIC POLYPS, HX OF 03/09/2007  . COPD 10/20/2007  . DIABETES MELLITUS, TYPE II 03/09/2007  . FATIGUE 10/05/2007  . GOUT 10/19/2006  . HYPERLIPIDEMIA 10/19/2006  . HYPERTENSION 10/19/2006  . SWELLING MASS OR LUMP IN HEAD AND NECK 01/09/2010  . Prostate cancer ([place]) 01/19/2012  . GERD (gastroesophageal reflux disease) 03/01/2012   Past Surgical History  Procedure Laterality Date  . Appendectomy      reports that he has quit smoking. He does not have any smokeless tobacco history on file. He reports that he does not drink alcohol. His drug history is not on file. family history includes Cancer in his other; Hypertension in his mother. No Known Allergies Current Outpatient Prescriptions on File Prior to Visit  Medication Sig Dispense Refill  . allopurinol (ZYLOPRIM) 300 MG tablet TAKE 1 TABLET DAILY 90 tablet 1  . amLODipine-benazepril (LOTREL) 5-20 MG per capsule Take 1 capsule by mouth daily. 90 capsule 1  . aspirin 81  MG tablet Take 81 mg by mouth daily.      Marland Kitchen atorvastatin (LIPITOR) 10 MG tablet TAKE 1 TABLET DAILY 90 tablet 3  . Blood Glucose Monitoring Suppl (FREESTYLE FREEDOM LITE) W/DEVICE KIT Use asd, 1 device, 250.02 1 each 0  . esomeprazole (NEXIUM) 40 MG capsule Take 1 capsule (40 mg total) by mouth daily. 90 capsule 3  . Lancets MISC Use asd 1 per day 250.02 100 each 11  . montelukast (SINGULAIR) 10 MG tablet TAKE 1 TABLET (10MG TOTAL ) BY MOUTH AT BEDTIME 90 tablet 2  . Omega-3 Fatty Acids (FISH OIL) 500 MG CAPS Take by mouth daily.      . vardenafil (LEVITRA) 20 MG tablet 1 by mouth every other day as needed     . VIAGRA 100 MG tablet TAKE ONE-HALF (1/2) TO 1 TABLET (50 TO 100 MG) DAILY AS NEEDED FOR ERECTILE DYSFUNCTION 18 tablet 4  . albuterol (PROVENTIL HFA;VENTOLIN HFA) 108 (90 BASE) MCG/ACT inhaler Inhale 2 puffs into the lungs 4 (four) times daily. (Patient not taking: Reported on 01/18/2015) 3 Inhaler 3  . Fluticasone-Salmeterol (ADVAIR DISKUS) 250-50 MCG/DOSE AEPB Inhale 1 puff into the lungs 2 (two) times daily. (Patient not taking: Reported on 01/18/2015) 3 each 3  . predniSONE (DELTASONE) 10 MG tablet 3 tabs by mouth per day for 3 days,2tabs per day for 3 days,1tab per day for 3 days (Patient not taking: Reported on 01/18/2015) 18 tablet 0   No current facility-administered  medications on file prior to visit.   Review of Systems  Constitutional: Negative for unusual diaphoresis or night sweats HENT: Negative for ringing in ear or discharge Eyes: Negative for double vision or worsening visual disturbance.  Respiratory: Negative for choking and stridor.   Gastrointestinal: Negative for vomiting or other signifcant bowel change Genitourinary: Negative for hematuria or change in urine volume.  Musculoskeletal: Negative for other MSK pain or swelling Skin: Negative for color change and worsening wound.  Neurological: Negative for tremors and numbness other than noted    Psychiatric/Behavioral: Negative for decreased concentration or agitation other than above       Objective:   Physical Exam BP 120/76 mmHg  Pulse 69  Temp(Src) 97.9 F (36.6 C) (Oral)  Ht 5' 5" (1.651 m)  Wt 201 lb (91.173 kg)  BMI 33.45 kg/m2  SpO2 96% VS noted,  Constitutional: Pt appears in no significant distress HENT: Head: NCAT.  Right Ear: External ear normal.  Left Ear: External ear normal.  Eyes: . Pupils are equal, round, and reactive to light. Conjunctivae and EOM are normal Neck: Normal range of motion. Neck supple.  Cardiovascular: Normal rate and regular rhythm.   Pulmonary/Chest: Effort normal and breath sounds without rales or wheezing.  Abd:  Soft, NT, ND, + BS Neurological: Pt is alert. Not confused , motor grossly intact Skin: Skin is warm. No rash, no LE edema Psychiatric: Pt behavior is normal. No agitation.     Assessment & Plan:

## 2015-01-18 NOTE — Assessment & Plan Note (Signed)
stable overall by history and exam, recent data reviewed with pt, and pt to continue medical treatment as before,  to f/u any worsening symptoms or concerns Lab Results  Component Value Date   HGBA1C 6.3 04/10/2014   For f/u labs

## 2015-01-18 NOTE — Progress Notes (Signed)
Pre visit review using our clinic review tool, if applicable. No additional management support is needed unless otherwise documented below in the visit note. 

## 2015-01-18 NOTE — Assessment & Plan Note (Signed)
stable overall by history and exam, recent data reviewed with pt, and pt to continue medical treatment as before,  to f/u any worsening symptoms or concerns Lab Results  Component Value Date   LDLCALC 69 04/10/2014    

## 2015-01-18 NOTE — Assessment & Plan Note (Signed)
Has f/u every 3-6 mo with urology with PSA, to continue as is

## 2015-01-18 NOTE — Patient Instructions (Addendum)
Please continue all other medications as before, and refills have been done if requested.  Please have the pharmacy call with any other refills you may need.  Please continue your efforts at being more active, low cholesterol diet, and weight control.  You are otherwise up to date with prevention measures today.  Please keep your appointments with your specialists as you may have planned  Note: Mychart technical support line at [phone])  Please go to the LAB in the Basement (turn left off the elevator) for the tests to be done today  You will be contacted by phone if any changes need to be made immediately.  Otherwise, you will receive a letter about your results with an explanation, but please check with MyChart first.  Please remember to sign up for MyChart if you have not done so, as this will be important to you in the future with finding out test results, communicating by private email, and scheduling acute appointments online when needed.  Please return in 6 months, or sooner if needed

## 2015-02-09 ENCOUNTER — Other Ambulatory Visit: Payer: Self-pay | Admitting: Internal Medicine

## 2015-05-08 DIAGNOSIS — C61 Malignant neoplasm of prostate: Secondary | ICD-10-CM | POA: Diagnosis not present

## 2015-05-15 DIAGNOSIS — C61 Malignant neoplasm of prostate: Secondary | ICD-10-CM | POA: Diagnosis not present

## 2015-05-15 DIAGNOSIS — N401 Enlarged prostate with lower urinary tract symptoms: Secondary | ICD-10-CM | POA: Diagnosis not present

## 2015-05-15 DIAGNOSIS — Z0000 Encounter for general adult medical examination without abnormal findings: Secondary | ICD-10-CM | POA: Diagnosis not present

## 2015-05-15 DIAGNOSIS — R3916 Straining to void: Secondary | ICD-10-CM | POA: Diagnosis not present

## 2015-05-17 ENCOUNTER — Other Ambulatory Visit: Payer: Self-pay | Admitting: Internal Medicine

## 2015-05-17 NOTE — Telephone Encounter (Signed)
Pt also need temporary supply to be send to [facility] ([place]). Please help, he is out of this med and need [place] to send some in to [facility] and then 3 month supply to [facility]

## 2015-05-20 ENCOUNTER — Telehealth: Payer: Self-pay

## 2015-05-20 MED ORDER — ALLOPURINOL 300 MG PO TABS
300.0000 mg | ORAL_TABLET | Freq: Every day | ORAL | Status: DC
Start: 2015-05-20 — End: 2015-07-26

## 2015-05-20 MED ORDER — ATORVASTATIN CALCIUM 10 MG PO TABS: 10.0000 mg | ORAL_TABLET | Freq: Every day | ORAL | Status: DC

## 2015-05-20 NOTE — Telephone Encounter (Signed)
Medication sent to pharmacy for temp

## 2015-07-19 ENCOUNTER — Other Ambulatory Visit (INDEPENDENT_AMBULATORY_CARE_PROVIDER_SITE_OTHER): Payer: Medicare Other

## 2015-07-19 ENCOUNTER — Ambulatory Visit (INDEPENDENT_AMBULATORY_CARE_PROVIDER_SITE_OTHER): Payer: Medicare Other | Admitting: Internal Medicine

## 2015-07-19 ENCOUNTER — Encounter: Payer: Self-pay | Admitting: Internal Medicine

## 2015-07-19 VITALS — BP 118/68 | HR 62 | Temp 97.9°F | Resp 20 | Wt 200.0 lb

## 2015-07-19 DIAGNOSIS — E119 Type 2 diabetes mellitus without complications: Secondary | ICD-10-CM

## 2015-07-19 DIAGNOSIS — J449 Chronic obstructive pulmonary disease, unspecified: Secondary | ICD-10-CM

## 2015-07-19 DIAGNOSIS — J4520 Mild intermittent asthma, uncomplicated: Secondary | ICD-10-CM

## 2015-07-19 DIAGNOSIS — I10 Essential (primary) hypertension: Secondary | ICD-10-CM

## 2015-07-19 DIAGNOSIS — C61 Malignant neoplasm of prostate: Secondary | ICD-10-CM

## 2015-07-19 DIAGNOSIS — E785 Hyperlipidemia, unspecified: Secondary | ICD-10-CM

## 2015-07-19 DIAGNOSIS — K219 Gastro-esophageal reflux disease without esophagitis: Secondary | ICD-10-CM

## 2015-07-19 DIAGNOSIS — G4733 Obstructive sleep apnea (adult) (pediatric): Secondary | ICD-10-CM

## 2015-07-19 HISTORY — DX: Obstructive sleep apnea (adult) (pediatric): G47.33

## 2015-07-19 LAB — HEPATIC FUNCTION PANEL
ALT: 21 U/L (ref 0–53)
AST: 18 U/L (ref 0–37)
Albumin: 4.1 g/dL (ref 3.5–5.2)
Alkaline Phosphatase: 55 U/L (ref 39–117)
Bilirubin, Direct: 0.2 mg/dL (ref 0.0–0.3)
Total Bilirubin: 0.7 mg/dL (ref 0.2–1.2)
Total Protein: 6.4 g/dL (ref 6.0–8.3)

## 2015-07-19 LAB — TSH: TSH: 0.96 u[IU]/mL (ref 0.35–4.50)

## 2015-07-19 LAB — CBC WITH DIFFERENTIAL/PLATELET
Basophils Absolute: 0 10*3/uL (ref 0.0–0.1)
Basophils Relative: 0.7 % (ref 0.0–3.0)
Eosinophils Absolute: 0.1 10*3/uL (ref 0.0–0.7)
Eosinophils Relative: 2.9 % (ref 0.0–5.0)
HCT: 41 % (ref 39.0–52.0)
Hemoglobin: 13.4 g/dL (ref 13.0–17.0)
Lymphocytes Relative: 38 % (ref 12.0–46.0)
Lymphs Abs: 1.4 10*3/uL (ref 0.7–4.0)
MCHC: 32.7 g/dL (ref 30.0–36.0)
MCV: 90.5 fl (ref 78.0–100.0)
Monocytes Absolute: 0.5 10*3/uL (ref 0.1–1.0)
Monocytes Relative: 12.2 % — ABNORMAL HIGH (ref 3.0–12.0)
Neutro Abs: 1.8 10*3/uL (ref 1.4–7.7)
Neutrophils Relative %: 46.2 % (ref 43.0–77.0)
Platelets: 216 10*3/uL (ref 150.0–400.0)
RBC: 4.53 Mil/uL (ref 4.22–5.81)
RDW: 16 % — ABNORMAL HIGH (ref 11.5–15.5)
WBC: 3.8 10*3/uL — ABNORMAL LOW (ref 4.0–10.5)

## 2015-07-19 LAB — LIPID PANEL
Cholesterol: 134 mg/dL (ref 0–200)
HDL: 50.2 mg/dL (ref >39.00)
LDL Cholesterol: 77 mg/dL (ref 0–99)
NonHDL: 83.86
Total CHOL/HDL Ratio: 3
Triglycerides: 34 mg/dL (ref 0.0–149.0)
VLDL: 6.8 mg/dL (ref 0.0–40.0)

## 2015-07-19 LAB — BASIC METABOLIC PANEL
BUN: 19 mg/dL (ref 6–23)
CO2: 31 mEq/L (ref 19–32)
Calcium: 9 mg/dL (ref 8.4–10.5)
Chloride: 105 mEq/L (ref 96–112)
Creatinine, Ser: 0.99 mg/dL (ref 0.40–1.50)
GFR: 94.33 mL/min (ref >60.00)
Glucose, Bld: 101 mg/dL — ABNORMAL HIGH (ref 70–99)
Potassium: 4.2 mEq/L (ref 3.5–5.1)
Sodium: 141 mEq/L (ref 135–145)

## 2015-07-19 LAB — URINALYSIS, ROUTINE W REFLEX MICROSCOPIC
Bilirubin Urine: NEGATIVE
Hgb urine dipstick: NEGATIVE
Ketones, ur: NEGATIVE
Leukocytes, UA: NEGATIVE
Nitrite: NEGATIVE
RBC / HPF: NONE SEEN (ref 0–?)
Specific Gravity, Urine: <=1.005 — AB (ref 1.000–1.030)
Total Protein, Urine: NEGATIVE
Urine Glucose: NEGATIVE
Urobilinogen, UA: 0.2 (ref 0.0–1.0)
pH: 6.5 (ref 5.0–8.0)

## 2015-07-19 LAB — PSA: PSA: 3.19 ng/mL (ref 0.10–4.00)

## 2015-07-19 LAB — HEMOGLOBIN A1C: Hgb A1c MFr Bld: 6.7 % — ABNORMAL HIGH (ref 4.6–6.5)

## 2015-07-19 LAB — MICROALBUMIN / CREATININE URINE RATIO
Creatinine,U: 28.2 mg/dL
Microalb Creat Ratio: 2.5 mg/g (ref 0.0–30.0)
Microalb, Ur: <0.7 mg/dL (ref 0.0–1.9)

## 2015-07-19 NOTE — Progress Notes (Signed)
Pre visit review using our clinic review tool, if applicable. No additional management support is needed unless otherwise documented below in the visit note. 

## 2015-07-19 NOTE — Patient Instructions (Signed)

## 2015-07-19 NOTE — Progress Notes (Signed)
Subjective:    Patient ID: Allen Dakin., male    DOB: [birth year], 75 y.o.   MRN: [id number]  HPI  Here for wellness and f/u;  Overall doing ok;  Pt denies Chest pain, worsening SOB, DOE, wheezing, orthopnea, PND, worsening LE edema, palpitations, dizziness or syncope.  Pt denies neurological change such as new headache, facial or extremity weakness.  Pt denies polydipsia, polyuria, or low sugar symptoms. Pt states overall good compliance with treatment and medications, good tolerability, and has been trying to follow appropriate diet.  Pt denies worsening depressive symptoms, suicidal ideation or panic. No fever, night sweats, wt loss, loss of appetite, or other constitutional symptoms.  Pt states good ability with ADL's, has low fall risk, home safety reviewed and adequate, no other significant changes in hearing or vision, and only occasionally active with exercise. Wt Readings from Last 3 Encounters:  07/19/15 200 lb (90.719 kg)  01/18/15 201 lb (91.173 kg)  07/19/14 197 lb (89.359 kg)  Denies worsening reflux, abd pain, dysphagia, n/v, bowel change or blood., doing well with otc prilosec 20 mg. Asks for PSA f/u , has been having watchjful waiting with known hx of prostate ca, followup with urology planned for July 2017. Denies urinary symptoms such as dysuria, frequency, urgency, flank pain, hematuria or n/v, fever, chills. Past Medical History  Diagnosis Date  . ALLERGIC RHINITIS 10/04/2008  . ASTHMA 10/04/2008  . BACK PAIN 04/18/2008  . BENIGN PROSTATIC HYPERTROPHY 03/09/2007  . COLONIC POLYPS, HX OF 03/09/2007  . COPD 10/20/2007  . DIABETES MELLITUS, TYPE II 03/09/2007  . FATIGUE 10/05/2007  . GOUT 10/19/2006  . HYPERLIPIDEMIA 10/19/2006  . HYPERTENSION 10/19/2006  . SWELLING MASS OR LUMP IN HEAD AND NECK 01/09/2010  . Prostate cancer ([facility]) 01/19/2012  . GERD (gastroesophageal reflux disease) 03/01/2012  . OSA (obstructive sleep apnea) 07/19/2015   Past Surgical History  Procedure  Laterality Date  . Appendectomy      reports that he has quit smoking. He does not have any smokeless tobacco history on file. He reports that he does not drink alcohol. His drug history is not on file. family history includes Cancer in his other; Hypertension in his mother. No Known Allergies Current Outpatient Prescriptions on File Prior to Visit  Medication Sig Dispense Refill  . albuterol (PROVENTIL HFA;VENTOLIN HFA) 108 (90 BASE) MCG/ACT inhaler Inhale 2 puffs into the lungs 4 (four) times daily. 3 Inhaler 3  . allopurinol (ZYLOPRIM) 300 MG tablet Take 1 tablet (300 mg total) by mouth daily. 15 tablet 0  . amLODipine-benazepril (LOTREL) 5-20 MG capsule Take 1 capsule by mouth daily. 90 capsule 1  . aspirin 81 MG tablet Take 81 mg by mouth daily.      Marland Kitchen atorvastatin (LIPITOR) 10 MG tablet Take 1 tablet (10 mg total) by mouth daily. 15 tablet 0  . Blood Glucose Monitoring Suppl (FREESTYLE FREEDOM LITE) W/DEVICE KIT Use asd, 1 device, 250.02 1 each 0  . budesonide-formoterol (SYMBICORT) 80-4.5 MCG/ACT inhaler Inhale 2 puffs into the lungs 2 (two) times daily.    Marland Kitchen esomeprazole (NEXIUM) 40 MG capsule Take 1 capsule (40 mg total) by mouth daily. 90 capsule 3  . Fluticasone-Salmeterol (ADVAIR DISKUS) 250-50 MCG/DOSE AEPB Inhale 1 puff into the lungs 2 (two) times daily. 3 each 3  . Lancets MISC Use asd 1 per day 250.02 100 each 11  . montelukast (SINGULAIR) 10 MG tablet TAKE 1 TABLET (10MG TOTAL ) BY MOUTH AT BEDTIME 90 tablet 2  .  Omega-3 Fatty Acids (FISH OIL) 500 MG CAPS Take by mouth daily.      . predniSONE (DELTASONE) 10 MG tablet 3 tabs by mouth per day for 3 days,2tabs per day for 3 days,1tab per day for 3 days 18 tablet 0  . vardenafil (LEVITRA) 20 MG tablet 1 by mouth every other day as needed     . VIAGRA 100 MG tablet TAKE ONE-HALF (1/2) TO 1 TABLET (50 TO 100 MG) DAILY AS NEEDED FOR ERECTILE DYSFUNCTION 18 tablet 4   No current facility-administered medications on file prior to  visit.     Review of Systems  Constitutional: Negative for unusual diaphoresis or night sweats HENT: Negative for ear swelling or discharge Eyes: Negative for worsening visual haziness  Respiratory: Negative for choking and stridor.   Gastrointestinal: Negative for distension or worsening eructation Genitourinary: Negative for retention or change in urine volume.  Musculoskeletal: Negative for other MSK pain or swelling Skin: Negative for color change and worsening wound Neurological: Negative for tremors and numbness other than noted  Psychiatric/Behavioral: Negative for decreased concentration or agitation other than above       Objective:   Physical Exam BP 118/68 mmHg  Pulse 62  Temp(Src) 97.9 F (36.6 C) (Oral)  Resp 20  Wt 200 lb (90.719 kg)  SpO2 96% VS noted,  Constitutional: Pt appears in no apparent distress HENT: Head: NCAT.  Right Ear: External ear normal.  Left Ear: External ear normal.  Eyes: . Pupils are equal, round, and reactive to light. Conjunctivae and EOM are normal Neck: Normal range of motion. Neck supple.  Cardiovascular: Normal rate and regular rhythm.   Pulmonary/Chest: Effort normal and breath sounds without rales or wheezing.  Abd:  Soft, NT, ND, + BS Neurological: Pt is alert. Not confused , motor grossly intact Skin: Skin is warm. No rash, no LE edema Psychiatric: Pt behavior is normal. No agitation.     Assessment & Plan:

## 2015-07-21 NOTE — Assessment & Plan Note (Signed)
stable overall by history and exam, recent data reviewed with pt, and pt to continue medical treatment as before,  to f/u any worsening symptoms or concerns  

## 2015-07-21 NOTE — Assessment & Plan Note (Signed)
stable overall by history and exam, recent data reviewed with pt, and pt to continue medical treatment as before,  to f/u any worsening symptoms or concerns Lab Results  Component Value Date   HGBA1C 6.7* 07/19/2015

## 2015-07-21 NOTE — Assessment & Plan Note (Signed)
stable overall by history and exam, recent data reviewed with pt, and pt to continue medical treatment as before,  to f/u any worsening symptoms or concerns, pt asks for f/u psa Lab Results  Component Value Date   PSA 3.19 07/19/2015   PSA 3.52 01/20/2011   PSA 2.23 10/08/2009

## 2015-07-21 NOTE — Assessment & Plan Note (Signed)
stable overall by history and exam, recent data reviewed with pt, and pt to continue medical treatment as before,  to f/u any worsening symptoms or concerns SpO2 Readings from Last 3 Encounters:  07/19/15 96%  01/18/15 96%  07/19/14 95%

## 2015-07-21 NOTE — Assessment & Plan Note (Signed)
stable overall by history and exam, recent data reviewed with pt, and pt to continue medical treatment as before,  to f/u any worsening symptoms or concerns BP Readings from Last 3 Encounters:  07/19/15 118/68  01/18/15 120/76  07/19/14 126/80

## 2015-07-21 NOTE — Assessment & Plan Note (Signed)
stable overall by history and exam, recent data reviewed with pt, and pt to continue medical treatment as before,  to f/u any worsening symptoms or concerns Lab Results  Component Value Date   LDLCALC 77 07/19/2015

## 2015-07-26 ENCOUNTER — Other Ambulatory Visit: Payer: Self-pay | Admitting: Internal Medicine

## 2015-08-23 ENCOUNTER — Ambulatory Visit (INDEPENDENT_AMBULATORY_CARE_PROVIDER_SITE_OTHER): Payer: Medicare Other | Admitting: Internal Medicine

## 2015-08-23 ENCOUNTER — Encounter: Payer: Self-pay | Admitting: Internal Medicine

## 2015-08-23 VITALS — BP 130/76 | HR 69 | Temp 98.4°F | Resp 20 | Wt 199.0 lb

## 2015-08-23 DIAGNOSIS — E785 Hyperlipidemia, unspecified: Secondary | ICD-10-CM

## 2015-08-23 DIAGNOSIS — E119 Type 2 diabetes mellitus without complications: Secondary | ICD-10-CM | POA: Diagnosis not present

## 2015-08-23 DIAGNOSIS — I10 Essential (primary) hypertension: Secondary | ICD-10-CM

## 2015-08-23 MED ORDER — ATORVASTATIN CALCIUM 10 MG PO TABS: 10.0000 mg | ORAL_TABLET | Freq: Every day | ORAL | Status: DC

## 2015-08-23 NOTE — Patient Instructions (Addendum)
Please continue all other medications as before, and refills have been done if requested.  Please have the pharmacy call with any other refills you may need.  Please continue your efforts at being more active, low cholesterol diet, and weight control.  You are otherwise up to date with prevention measures today.  Please keep your appointments with your specialists as you may have planned  See you next Halloween!

## 2015-08-23 NOTE — Progress Notes (Signed)
Pre visit review using our clinic review tool, if applicable. No additional management support is needed unless otherwise documented below in the visit note. 

## 2015-08-23 NOTE — Assessment & Plan Note (Signed)
stable overall by history and exam, recent data reviewed with pt, and pt to continue medical treatment as before,  to f/u any worsening symptoms or concerns BP Readings from Last 3 Encounters:  08/23/15 130/76  07/19/15 118/68  01/18/15 120/76

## 2015-08-23 NOTE — Progress Notes (Signed)
 Subjective:    Patient ID: Allen A Flinchbaugh Jr., male    DOB: 03/24/1940, 75 y.o.   MRN: [id number]  HPI  Here with Am sugars recently at 190 and 200, reallizes now he had incresaed calories the night before, check cbg's daily, elev sugar only high those two times; has also joined gym recently; also realizes when he goes to the gym the night before his sugars are in low 100;s the next am.  Rarely CBG later in the day but usually in mid 100's.  Lab Results  Component Value Date   HGBA1C 6.7* 07/19/2015    Pt denies polydipsia, polyuria, or low sugar symptoms such as weakness or confusion improved with po intake.  Pt states overall good compliance with meds, trying to follow lower cholesterol, diabetic diet, wt overall stable but little exercise however.    Wt Readings from Last 3 Encounters:  08/23/15 199 lb (90.266 kg)  07/19/15 200 lb (90.719 kg)  01/18/15 201 lb (91.173 kg)  BP at home similar to today,  Trying to follow lower chol diet Past Medical History  Diagnosis Date  . ALLERGIC RHINITIS 10/04/2008  . ASTHMA 10/04/2008  . BACK PAIN 04/18/2008  . BENIGN PROSTATIC HYPERTROPHY 03/09/2007  . COLONIC POLYPS, HX OF 03/09/2007  . COPD 10/20/2007  . DIABETES MELLITUS, TYPE II 03/09/2007  . FATIGUE 10/05/2007  . GOUT 10/19/2006  . HYPERLIPIDEMIA 10/19/2006  . HYPERTENSION 10/19/2006  . SWELLING MASS OR LUMP IN HEAD AND NECK 01/09/2010  . Prostate cancer ([facility]) 01/19/2012  . GERD (gastroesophageal reflux disease) 03/01/2012  . OSA (obstructive sleep apnea) 07/19/2015   Past Surgical History  Procedure Laterality Date  . Appendectomy      reports that he has quit smoking. He does not have any smokeless tobacco history on file. He reports that he does not drink alcohol. His drug history is not on file. family history includes Cancer in his other; Hypertension in his mother. No Known Allergies Current Outpatient Prescriptions on File Prior to Visit  Medication Sig Dispense Refill  .  albuterol (PROVENTIL HFA;VENTOLIN HFA) 108 (90 BASE) MCG/ACT inhaler Inhale 2 puffs into the lungs 4 (four) times daily. 3 Inhaler 3  . allopurinol (ZYLOPRIM) 300 MG tablet TAKE 1 TABLET DAILY 90 tablet 0  . amLODipine-benazepril (LOTREL) 5-20 MG capsule Take 1 capsule by mouth daily. 90 capsule 1  . aspirin 81 MG tablet Take 81 mg by mouth daily.      . Blood Glucose Monitoring Suppl (FREESTYLE FREEDOM LITE) W/DEVICE KIT Use asd, 1 device, 250.02 1 each 0  . budesonide-formoterol (SYMBICORT) 80-4.5 MCG/ACT inhaler Inhale 2 puffs into the lungs 2 (two) times daily.    . esomeprazole (NEXIUM) 40 MG capsule Take 1 capsule (40 mg total) by mouth daily. 90 capsule 3  . Fluticasone-Salmeterol (ADVAIR DISKUS) 250-50 MCG/DOSE AEPB Inhale 1 puff into the lungs 2 (two) times daily. 3 each 3  . Lancets MISC Use asd 1 per day 250.02 100 each 11  . montelukast (SINGULAIR) 10 MG tablet TAKE 1 TABLET (10MG TOTAL ) BY MOUTH AT BEDTIME 90 tablet 2  . Omega-3 Fatty Acids (FISH OIL) 500 MG CAPS Take by mouth daily.      . predniSONE (DELTASONE) 10 MG tablet 3 tabs by mouth per day for 3 days,2tabs per day for 3 days,1tab per day for 3 days 18 tablet 0  . vardenafil (LEVITRA) 20 MG tablet 1 by mouth every other day as needed     .   VIAGRA 100 MG tablet TAKE ONE-HALF (1/2) TO 1 TABLET (50 TO 100 MG) DAILY AS NEEDED FOR ERECTILE DYSFUNCTION 18 tablet 4   No current facility-administered medications on file prior to visit.   Review of Systems  Constitutional: Negative for unusual diaphoresis or night sweats HENT: Negative for ear swelling or discharge Eyes: Negative for worsening visual haziness  Respiratory: Negative for choking and stridor.   Gastrointestinal: Negative for distension or worsening eructation Genitourinary: Negative for retention or change in urine volume.  Musculoskeletal: Negative for other MSK pain or swelling Skin: Negative for color change and worsening wound Neurological: Negative for  tremors and numbness other than noted  Psychiatric/Behavioral: Negative for decreased concentration or agitation other than above       Objective:   Physical Exam BP 130/76 mmHg  Pulse 69  Temp(Src) 98.4 F (36.9 C) (Oral)  Resp 20  Wt 199 lb (90.266 kg)  SpO2 93% VS noted,  Constitutional: Pt appears in no apparent distress HENT: Head: NCAT.  Right Ear: External ear normal.  Left Ear: External ear normal.  Eyes: . Pupils are equal, round, and reactive to light. Conjunctivae and EOM are normal Neck: Normal range of motion. Neck supple.  Cardiovascular: Normal rate and regular rhythm.   Pulmonary/Chest: Effort normal and breath sounds without rales or wheezing.  Neurological: Pt is alert. Not confused , motor grossly intact Skin: Skin is warm. No rash, no LE edema Psychiatric: Pt behavior is normal. No agitation.     Assessment & Plan:   

## 2015-08-23 NOTE — Assessment & Plan Note (Signed)
stable overall by history and exam, recent data reviewed with pt, and pt to continue medical treatment as before,  to f/u any worsening symptoms or concerns Lab Results  Component Value Date   LDLCALC 77 07/19/2015

## 2015-08-23 NOTE — Assessment & Plan Note (Signed)
stable overall by history and exam, recent data reviewed with pt, and pt to continue medical treatment as before,  to f/u any worsening symptoms or concerns Lab Results  Component Value Date   HGBA1C 6.7* 07/19/2015   To follow closer diet and cont wt loss efforts, no need change of med

## 2015-09-04 ENCOUNTER — Ambulatory Visit: Payer: TRICARE For Life (TFL) | Admitting: Internal Medicine

## 2015-09-11 ENCOUNTER — Other Ambulatory Visit: Payer: Self-pay | Admitting: Internal Medicine

## 2015-09-28 ENCOUNTER — Encounter: Payer: Self-pay | Admitting: Internal Medicine

## 2015-10-02 DIAGNOSIS — C61 Malignant neoplasm of prostate: Secondary | ICD-10-CM | POA: Diagnosis not present

## 2015-10-09 ENCOUNTER — Other Ambulatory Visit: Payer: Self-pay | Admitting: *Deleted

## 2015-10-09 DIAGNOSIS — M79674 Pain in right toe(s): Secondary | ICD-10-CM | POA: Diagnosis not present

## 2015-10-09 DIAGNOSIS — B351 Tinea unguium: Secondary | ICD-10-CM | POA: Diagnosis not present

## 2015-10-09 DIAGNOSIS — E119 Type 2 diabetes mellitus without complications: Secondary | ICD-10-CM | POA: Diagnosis not present

## 2015-10-09 DIAGNOSIS — M79675 Pain in left toe(s): Secondary | ICD-10-CM | POA: Diagnosis not present

## 2015-10-09 MED ORDER — ESOMEPRAZOLE MAGNESIUM 40 MG PO CPDR: 40.0000 mg | DELAYED_RELEASE_CAPSULE | Freq: Every day | ORAL | Status: DC

## 2015-10-10 ENCOUNTER — Telehealth: Payer: Self-pay

## 2015-10-10 DIAGNOSIS — C61 Malignant neoplasm of prostate: Secondary | ICD-10-CM | POA: Diagnosis not present

## 2015-10-10 NOTE — Telephone Encounter (Signed)
Please advise 

## 2015-10-10 NOTE — Telephone Encounter (Signed)
Patient came in and just wanted Dr. Jenny Reichmann to be aware that he did get his foot exam a [facility]. He wants to you to get the report, he says that you can go on "[employer]" to see the information. Please follow up if you need to. He just states he wanted Dr. Jenny Reichmann to be aware of the results.

## 2015-10-11 ENCOUNTER — Encounter: Payer: Self-pay | Admitting: Internal Medicine

## 2015-10-28 ENCOUNTER — Telehealth: Payer: Self-pay | Admitting: Emergency Medicine

## 2015-10-28 NOTE — Telephone Encounter (Signed)
[facility] stated [facility] brand NEXIUM specified brand name only no substitutions. Phone # [phone].  Please follow up thanks.

## 2015-10-29 ENCOUNTER — Other Ambulatory Visit: Payer: Self-pay

## 2015-10-29 MED ORDER — ALLOPURINOL 300 MG PO TABS: 300.0000 mg | ORAL_TABLET | Freq: Every day | ORAL | 0 refills | Status: DC

## 2015-10-29 MED ORDER — NEXIUM 40 MG PO CPDR: 40.0000 mg | DELAYED_RELEASE_CAPSULE | Freq: Every day | ORAL | 0 refills | Status: DC

## 2015-10-29 NOTE — Progress Notes (Signed)
Medication refill sent to pharmacy  

## 2015-10-29 NOTE — Telephone Encounter (Signed)
Resent script Jovahn Breit name NEXIUM...Johny Chess

## 2015-11-01 ENCOUNTER — Telehealth: Payer: Self-pay | Admitting: *Deleted

## 2015-11-01 ENCOUNTER — Other Ambulatory Visit: Payer: Self-pay | Admitting: *Deleted

## 2015-11-01 MED ORDER — SILDENAFIL CITRATE 100 MG PO TABS: ORAL_TABLET | ORAL | 1 refills | Status: DC

## 2015-11-01 NOTE — Telephone Encounter (Signed)
Rec'd call pt states [facility] states they have been trying to get in contact with MD. He is needing a PA on Pantoprazole 40 mg. Pls call  # [phone]..../LMB

## 2015-11-05 NOTE — Telephone Encounter (Signed)
PA initiated via CoverMyMeds key [id number]

## 2015-11-07 ENCOUNTER — Telehealth: Payer: Self-pay | Admitting: Internal Medicine

## 2015-11-07 NOTE — Telephone Encounter (Signed)
Please see previous phone note dated 11/01/2015

## 2015-11-07 NOTE — Telephone Encounter (Signed)
Patient is requesting call back on status of PA.

## 2015-11-07 NOTE — Telephone Encounter (Signed)
Patient is requesting refill on nexium 40mg  [facility]

## 2015-11-07 NOTE — Telephone Encounter (Signed)
Patient states pharmacy has script but needs PA processed.  Can call [phone]

## 2015-11-07 NOTE — Telephone Encounter (Signed)
Notified patient PA was processed on 15th and will receive phone call once our office receives a notification from his insurance company. Thanks!

## 2015-12-10 ENCOUNTER — Other Ambulatory Visit: Payer: Self-pay | Admitting: Internal Medicine

## 2015-12-12 ENCOUNTER — Other Ambulatory Visit: Payer: Self-pay | Admitting: Internal Medicine

## 2016-01-21 ENCOUNTER — Ambulatory Visit (INDEPENDENT_AMBULATORY_CARE_PROVIDER_SITE_OTHER): Payer: Medicare Other | Admitting: Internal Medicine

## 2016-01-21 ENCOUNTER — Other Ambulatory Visit: Payer: Self-pay | Admitting: Internal Medicine

## 2016-01-21 ENCOUNTER — Encounter: Payer: Self-pay | Admitting: Internal Medicine

## 2016-01-21 ENCOUNTER — Other Ambulatory Visit (INDEPENDENT_AMBULATORY_CARE_PROVIDER_SITE_OTHER): Payer: Medicare Other

## 2016-01-21 VITALS — BP 128/72 | HR 74 | Temp 98.0°F | Resp 20 | Wt 194.8 lb

## 2016-01-21 DIAGNOSIS — I10 Essential (primary) hypertension: Secondary | ICD-10-CM | POA: Diagnosis not present

## 2016-01-21 DIAGNOSIS — R972 Elevated prostate specific antigen [PSA]: Secondary | ICD-10-CM

## 2016-01-21 DIAGNOSIS — E785 Hyperlipidemia, unspecified: Secondary | ICD-10-CM | POA: Diagnosis not present

## 2016-01-21 DIAGNOSIS — E119 Type 2 diabetes mellitus without complications: Secondary | ICD-10-CM | POA: Diagnosis not present

## 2016-01-21 LAB — BASIC METABOLIC PANEL
BUN: 36 mg/dL — ABNORMAL HIGH (ref 6–23)
CO2: 27 mEq/L (ref 19–32)
Calcium: 9.7 mg/dL (ref 8.4–10.5)
Chloride: 105 mEq/L (ref 96–112)
Creatinine, Ser: 1.17 mg/dL (ref 0.40–1.50)
GFR: 77.69 mL/min (ref >60.00)
Glucose, Bld: 86 mg/dL (ref 70–99)
Potassium: 4.8 mEq/L (ref 3.5–5.1)
Sodium: 141 mEq/L (ref 135–145)

## 2016-01-21 LAB — PSA: PSA: 4.98 ng/mL — ABNORMAL HIGH (ref 0.10–4.00)

## 2016-01-21 LAB — LIPID PANEL
Cholesterol: 198 mg/dL (ref 0–200)
HDL: 55.2 mg/dL (ref >39.00)
LDL Cholesterol: 123 mg/dL — ABNORMAL HIGH (ref 0–99)
NonHDL: 142.93
Total CHOL/HDL Ratio: 4
Triglycerides: 102 mg/dL (ref 0.0–149.0)
VLDL: 20.4 mg/dL (ref 0.0–40.0)

## 2016-01-21 LAB — HEPATIC FUNCTION PANEL
ALT: 24 U/L (ref 0–53)
AST: 24 U/L (ref 0–37)
Albumin: 4.1 g/dL (ref 3.5–5.2)
Alkaline Phosphatase: 50 U/L (ref 39–117)
Bilirubin, Direct: 0.1 mg/dL (ref 0.0–0.3)
Total Bilirubin: 0.5 mg/dL (ref 0.2–1.2)
Total Protein: 6.3 g/dL (ref 6.0–8.3)

## 2016-01-21 LAB — HEMOGLOBIN A1C: Hgb A1c MFr Bld: 6.4 % (ref 4.6–6.5)

## 2016-01-21 MED ORDER — ATORVASTATIN CALCIUM 20 MG PO TABS: 20.0000 mg | ORAL_TABLET | Freq: Every day | ORAL | 3 refills | Status: DC

## 2016-01-21 NOTE — Progress Notes (Signed)
Subjective:    Patient ID: Allen Dakin., male    DOB: [birth year], 75 y.o.   MRN: [id number]  HPI    Here to f/u; overall doing ok,  Pt denies chest pain, increasing sob or doe, wheezing, orthopnea, PND, increased LE swelling, palpitations, dizziness or syncope.  Pt denies new neurological symptoms such as new headache, or facial or extremity weakness or numbness.  Pt denies polydipsia, polyuria, or low sugar episode.   Pt denies new neurological symptoms such as new headache, or facial or extremity weakness or numbness.   Pt states overall good compliance with meds,  Has lost wt with better diet and exercise with personal trainer.  Using app call [employer]fitnesspal which incorporates diet and hde has been eating somewhat different , wants to know if he is actually making lipids worse.  Has had fairly consistent LDL chol just over 70 for several yrs. Denies urinary symptoms such as dysuria, frequency, urgency, flank pain, hematuria or n/v, fever, chills. Wt Readings from Last 3 Encounters:  01/21/16 194 lb 12 oz (88.3 kg)  08/23/15 199 lb (90.3 kg)  07/19/15 200 lb (90.7 kg)   Past Medical History:  Diagnosis Date  . ALLERGIC RHINITIS 10/04/2008  . ASTHMA 10/04/2008  . BACK PAIN 04/18/2008  . BENIGN PROSTATIC HYPERTROPHY 03/09/2007  . COLONIC POLYPS, HX OF 03/09/2007  . COPD 10/20/2007  . DIABETES MELLITUS, TYPE II 03/09/2007  . FATIGUE 10/05/2007  . GERD (gastroesophageal reflux disease) 03/01/2012  . GOUT 10/19/2006  . HYPERLIPIDEMIA 10/19/2006  . HYPERTENSION 10/19/2006  . OSA (obstructive sleep apnea) 07/19/2015  . Prostate cancer ([facility]) 01/19/2012  . SWELLING MASS OR LUMP IN HEAD AND NECK 01/09/2010   Past Surgical History:  Procedure Laterality Date  . APPENDECTOMY      reports that he has quit smoking. He does not have any smokeless tobacco history on file. He reports that he does not drink alcohol. His drug history is not on file. family history includes Cancer in his other;  Hypertension in his mother. No Known Allergies Current Outpatient Prescriptions on File Prior to Visit  Medication Sig Dispense Refill  . albuterol (PROVENTIL HFA;VENTOLIN HFA) 108 (90 BASE) MCG/ACT inhaler Inhale 2 puffs into the lungs 4 (four) times daily. 3 Inhaler 3  . allopurinol (ZYLOPRIM) 300 MG tablet Take 1 tablet (300 mg total) by mouth daily. 90 tablet 0  . amLODipine-benazepril (LOTREL) 5-20 MG capsule TAKE 1 CAPSULE DAILY 90 capsule 0  . aspirin 81 MG tablet Take 81 mg by mouth daily.      . Blood Glucose Monitoring Suppl (FREESTYLE FREEDOM LITE) W/DEVICE KIT Use asd, 1 device, 250.02 1 each 0  . budesonide-formoterol (SYMBICORT) 80-4.5 MCG/ACT inhaler Inhale 2 puffs into the lungs 2 (two) times daily.    . Fluticasone-Salmeterol (ADVAIR DISKUS) 250-50 MCG/DOSE AEPB Inhale 1 puff into the lungs 2 (two) times daily. 3 each 3  . FREESTYLE LITE test strip USE TO CHECK BLOOD SUGAR ONCE DAILY AS DIRECTED 300 each 2  . Lancets MISC Use asd 1 per day 250.02 100 each 11  . montelukast (SINGULAIR) 10 MG tablet TAKE 1 TABLET (10MG TOTAL ) BY MOUTH AT BEDTIME 90 tablet 2  . NEXIUM 40 MG capsule Take 1 capsule (40 mg total) by mouth daily. Must keep Oct appt for future refills 90 capsule 0  . Omega-3 Fatty Acids (FISH OIL) 500 MG CAPS Take by mouth daily.      . predniSONE (DELTASONE) 10 MG tablet  3 tabs by mouth per day for 3 days,2tabs per day for 3 days,1tab per day for 3 days 18 tablet 0  . sildenafil (VIAGRA) 100 MG tablet TAKE ONE-HALF (1/2) TO 1 TABLET (50 TO 100 MG) DAILY AS NEEDED FOR ERECTILE DYSFUNCTION 18 tablet 1  . vardenafil (LEVITRA) 20 MG tablet 1 by mouth every other day as needed      No current facility-administered medications on file prior to visit.    Review of Systems  Constitutional: Negative for unusual diaphoresis or night sweats HENT: Negative for ear swelling or discharge Eyes: Negative for worsening visual haziness  Respiratory: Negative for choking and  stridor.   Gastrointestinal: Negative for distension or worsening eructation Genitourinary: Negative for retention or change in urine volume.  Musculoskeletal: Negative for other MSK pain or swelling Skin: Negative for color change and worsening wound Neurological: Negative for tremors and numbness other than noted  Psychiatric/Behavioral: Negative for decreased concentration or agitation other than above       Objective:   Physical Exam BP 128/72   Pulse 74   Temp 98 F (36.7 C) (Oral)   Resp 20   Wt 194 lb 12 oz (88.3 kg)   SpO2 96%   BMI 32.41 kg/m  VS noted,  Constitutional: Pt appears in no apparent distress HENT: Head: NCAT.  Right Ear: External ear normal.  Left Ear: External ear normal.  Eyes: . Pupils are equal, round, and reactive to light. Conjunctivae and EOM are normal Neck: Normal range of motion. Neck supple.  Cardiovascular: Normal rate and regular rhythm.   Pulmonary/Chest: Effort normal and breath sounds without rales or wheezing.  Abd:  Soft, NT, ND, + BS Neurological: Pt is alert. Not confused , motor grossly intact Skin: Skin is warm. No rash, no LE edema Psychiatric: Pt behavior is normal. No agitation.     Assessment & Plan:

## 2016-01-21 NOTE — Patient Instructions (Addendum)

## 2016-01-21 NOTE — Progress Notes (Signed)
Pre visit review using our clinic review tool, if applicable. No additional management support is needed unless otherwise documented below in the visit note. 

## 2016-01-27 NOTE — Assessment & Plan Note (Signed)
stable overall by history and exam, for cont;d DM low chol diet, and pt to continue medical treatment as before,  to f/u any worsening symptoms or concerns, f/u lab today, goal LDl < 70

## 2016-01-27 NOTE — Assessment & Plan Note (Signed)
stable overall by history and exam, recent data reviewed with pt, and pt to continue medical treatment as before,  to f/u any worsening symptoms or concerns Lab Results  Component Value Date   HGBA1C 6.4 01/21/2016

## 2016-01-27 NOTE — Assessment & Plan Note (Signed)
Asympt, Recent increased velocity, consider urology referral if further elvated, for f/u lab today

## 2016-01-27 NOTE — Assessment & Plan Note (Signed)
stable overall by history and exam, recent data reviewed with pt, and pt to continue medical treatment as before,  to f/u any worsening symptoms or concerns BP Readings from Last 3 Encounters:  01/21/16 128/72  08/23/15 130/76  07/19/15 118/68

## 2016-02-01 ENCOUNTER — Other Ambulatory Visit: Payer: Self-pay | Admitting: Internal Medicine

## 2016-02-03 DIAGNOSIS — R351 Nocturia: Secondary | ICD-10-CM | POA: Diagnosis not present

## 2016-02-03 DIAGNOSIS — J441 Chronic obstructive pulmonary disease with (acute) exacerbation: Secondary | ICD-10-CM | POA: Diagnosis not present

## 2016-02-03 DIAGNOSIS — R972 Elevated prostate specific antigen [PSA]: Secondary | ICD-10-CM | POA: Diagnosis not present

## 2016-02-10 DIAGNOSIS — R05 Cough: Secondary | ICD-10-CM | POA: Diagnosis not present

## 2016-02-18 ENCOUNTER — Telehealth: Payer: Self-pay | Admitting: Internal Medicine

## 2016-02-18 NOTE — Telephone Encounter (Signed)
Called Allen Hunter to scheduled [facility] appt. Left message for patient to call office to schedule appt.

## 2016-02-26 ENCOUNTER — Other Ambulatory Visit: Payer: Self-pay | Admitting: Internal Medicine

## 2016-04-16 ENCOUNTER — Other Ambulatory Visit: Payer: Self-pay | Admitting: Internal Medicine

## 2016-04-17 DIAGNOSIS — C61 Malignant neoplasm of prostate: Secondary | ICD-10-CM | POA: Diagnosis not present

## 2016-04-24 DIAGNOSIS — N401 Enlarged prostate with lower urinary tract symptoms: Secondary | ICD-10-CM | POA: Diagnosis not present

## 2016-04-24 DIAGNOSIS — C61 Malignant neoplasm of prostate: Secondary | ICD-10-CM | POA: Diagnosis not present

## 2016-04-24 DIAGNOSIS — R3912 Poor urinary stream: Secondary | ICD-10-CM | POA: Diagnosis not present

## 2016-05-26 ENCOUNTER — Other Ambulatory Visit: Payer: Self-pay | Admitting: Internal Medicine

## 2016-06-08 ENCOUNTER — Telehealth: Payer: Self-pay | Admitting: *Deleted

## 2016-06-08 MED ORDER — AMLODIPINE BESY-BENAZEPRIL HCL 5-20 MG PO CAPS: 1.0000 | ORAL_CAPSULE | Freq: Every day | ORAL | 1 refills | Status: DC

## 2016-06-08 NOTE — Telephone Encounter (Signed)
Rec'd fax pt requesting refill on his Lotrel 5/20 mg. Sent electronically...Johny Chess

## 2016-06-15 DIAGNOSIS — G4733 Obstructive sleep apnea (adult) (pediatric): Secondary | ICD-10-CM | POA: Diagnosis not present

## 2016-06-15 DIAGNOSIS — J441 Chronic obstructive pulmonary disease with (acute) exacerbation: Secondary | ICD-10-CM | POA: Diagnosis not present

## 2016-06-15 DIAGNOSIS — J449 Chronic obstructive pulmonary disease, unspecified: Secondary | ICD-10-CM | POA: Diagnosis not present

## 2016-06-15 DIAGNOSIS — R05 Cough: Secondary | ICD-10-CM | POA: Diagnosis not present

## 2016-06-17 ENCOUNTER — Other Ambulatory Visit: Payer: Self-pay | Admitting: Internal Medicine

## 2016-06-17 NOTE — Telephone Encounter (Signed)
NEEDS OV FOR ADDITIONAL REFILLS

## 2016-06-26 ENCOUNTER — Telehealth: Payer: Self-pay | Admitting: Internal Medicine

## 2016-06-26 NOTE — Telephone Encounter (Signed)
Requesting script refill on omeprazole 40mg  and 20mg .  Please escribe [facility] [address] [place] [address])  If need fax [phone].  Please call back if needed.

## 2016-06-29 MED ORDER — NEXIUM 40 MG PO CPDR: 40.0000 mg | DELAYED_RELEASE_CAPSULE | Freq: Every day | ORAL | 3 refills | Status: DC

## 2016-06-29 NOTE — Telephone Encounter (Signed)
Done

## 2016-07-09 ENCOUNTER — Ambulatory Visit: Payer: Medicare Other | Admitting: Internal Medicine

## 2016-07-16 NOTE — Progress Notes (Addendum)
Subjective:   Allen Hunter. is a 76 y.o. male who presents for Medicare Annual/Subsequent preventive examination.  Patient concerned that his AM blood sugar reading has elevated to the 130s-140s. Education provided, patient has upcoming appointment with PCP 07/21/16  Review of Systems:  No ROS.  Medicare Wellness Visit.    Sleep patterns: no sleep issues, feels rested on waking, gets up 2-3 times nightly to void and sleeps 7-8 hours nightly.   Home Safety/Smoke Alarms:  Feels safe in home. Smoke alarms in place.   Living environment; residence and Firearm Safety: 2-story house, no firearms. Lives alone, no needs for DME Seat Belt Safety/Bike Helmet: Wears seat belt.   Counseling:   Eye Exam- Last appointment 2016 through [place], discussed that Medicare will cover yearly eye exam, patient states he will call Medicare to obtain name of an opthamologist and will make an appointment. Dental- appointment every 6 months  Male:   CCS- Last 05/16/08, benign polyps, recall 10 years   PSA-  Lab Results  Component Value Date   PSA 4.98 (H) 01/21/2016   PSA 3.19 07/19/2015   PSA 3.52 01/20/2011      Objective:    Vitals: There were no vitals taken for this visit.  There is no height or weight on file to calculate BMI.  Tobacco History  Smoking Status  . Former Smoker  Smokeless Tobacco  . Not on file     Counseling given: Not Answered   Past Medical History:  Diagnosis Date  . ALLERGIC RHINITIS 10/04/2008  . ASTHMA 10/04/2008  . BACK PAIN 04/18/2008  . BENIGN PROSTATIC HYPERTROPHY 03/09/2007  . COLONIC POLYPS, HX OF 03/09/2007  . COPD 10/20/2007  . DIABETES MELLITUS, TYPE II 03/09/2007  . FATIGUE 10/05/2007  . GERD (gastroesophageal reflux disease) 03/01/2012  . GOUT 10/19/2006  . HYPERLIPIDEMIA 10/19/2006  . HYPERTENSION 10/19/2006  . OSA (obstructive sleep apnea) 07/19/2015  . Prostate cancer ([place]) 01/19/2012  . SWELLING MASS OR LUMP IN HEAD AND NECK 01/09/2010   Past  Surgical History:  Procedure Laterality Date  . APPENDECTOMY     Family History  Problem Relation Age of Onset  . Hypertension Mother   . Cancer Other    History  Sexual Activity  . Sexual activity: Not on file    Outpatient Encounter Prescriptions as of 07/17/2016  Medication Sig  . albuterol (PROVENTIL HFA;VENTOLIN HFA) 108 (90 BASE) MCG/ACT inhaler Inhale 2 puffs into the lungs 4 (four) times daily.  Marland Kitchen allopurinol (ZYLOPRIM) 300 MG tablet TAKE 1 TABLET DAILY  . amLODipine-benazepril (LOTREL) 5-20 MG capsule Take 1 capsule by mouth daily.  Marland Kitchen aspirin 81 MG tablet Take 81 mg by mouth daily.    Marland Kitchen atorvastatin (LIPITOR) 20 MG tablet Take 1 tablet (20 mg total) by mouth daily.  . Blood Glucose Monitoring Suppl (FREESTYLE FREEDOM LITE) W/DEVICE KIT Use asd, 1 device, 250.02  . budesonide-formoterol (SYMBICORT) 80-4.5 MCG/ACT inhaler Inhale 2 puffs into the lungs 2 (two) times daily.  . Fluticasone-Salmeterol (ADVAIR DISKUS) 250-50 MCG/DOSE AEPB Inhale 1 puff into the lungs 2 (two) times daily.  Marland Kitchen FREESTYLE LITE test strip USE TO CHECK BLOOD SUGAR ONCE DAILY AS DIRECTED  . Lancets MISC Use asd 1 per day 250.02  . montelukast (SINGULAIR) 10 MG tablet TAKE 1 TABLET (10MG TOTAL ) BY MOUTH AT BEDTIME  . NEXIUM 40 MG capsule Take 1 capsule (40 mg total) by mouth daily.  . Omega-3 Fatty Acids (FISH OIL) 500 MG CAPS Take  by mouth daily.    . predniSONE (DELTASONE) 10 MG tablet 3 tabs by mouth per day for 3 days,2tabs per day for 3 days,1tab per day for 3 days  . vardenafil (LEVITRA) 20 MG tablet 1 by mouth every other day as needed   . VIAGRA 100 MG tablet TAKE ONE-HALF (1/2) TO ONE TABLET (50 MG TO 100 MG) DAILY AS NEEDED FOR ERECTILE DYSFUNCTION   No facility-administered encounter medications on file as of 07/17/2016.     Activities of Daily Living No flowsheet data found.  Patient Care Team: [name], MD as PCP - General   Assessment:    Physical assessment deferred to  PCP.  Exercise Activities and Dietary recommendations   Diet (meal preparation, eat out, water intake, caffeinated beverages, dairy products, fruits and vegetables): in general, a "healthy" diet  , well balanced, diabetic, low fat/ cholesterol, low salt Reports eating a variety of fruits and vegetables daily, limits caffeine, drinks 2 glasses of water daily. Patient states he eats up to 5-6 apples daily  Encouraged patient to increase daily water intake. Discussed starting a diet journal and taking blood sugar in the evening hours and recording and to bring data to upcoming PCP visit 07/21/16. Discussed diabetic and heart healthy diet, diet education was attached to patient's AVS.  Goals    None     Fall Risk Fall Risk  07/19/2015 07/19/2014 09/05/2013 03/07/2013  Falls in the past year? No Yes Yes No  Number falls in past yr: - 1 1 -  Injury with Fall? - No No -   Depression Screen PHQ 2/9 Scores 07/19/2015 07/19/2014 09/05/2013 03/07/2013  PHQ - 2 Score 0 0 0 0    Cognitive Function       Ad8 score reviewed for issues:  Issues making decisions: no  Less interest in hobbies / activities: no  Repeats questions, stories (family complaining): no  Trouble using ordinary gadgets (microwave, computer, phone): no  Forgets the month or year: no  Mismanaging finances: no  Remembering appts: no  Daily problems with thinking and/or memory: no Ad8 score is= 0  Immunization History  Administered Date(s) Administered  . H1N1 04/02/2008  . Influenza Whole 03/09/2007, 12/27/2007, 01/21/2009  . Influenza, Seasonal, Injecte, Preservative Fre 02/02/2013  . Influenza,inj,Quad PF,36+ Mos 01/18/2015  . Influenza-Unspecified 01/15/2016  . Pneumococcal Conjugate-13 04/04/2013  . Pneumococcal Polysaccharide-23 03/09/2007  . Td 04/02/2008  . Zoster 03/07/2013   Screening Tests Health Maintenance  Topic Date Due  . FOOT EXAM  07/19/2015  . OPHTHALMOLOGY EXAM  04/23/2016  . HEMOGLOBIN A1C   07/20/2016  . INFLUENZA VACCINE  10/21/2016  . TETANUS/TDAP  04/02/2018  . PNA vac Low Risk Adult  Completed      Plan:    Continue to eat heart healthy diet (full of fruits, vegetables, whole grains, lean protein, water--limit salt, fat, and sugar intake) and increase physical activity as tolerated.  Continue doing brain stimulating activities (puzzles, reading, adult coloring books, staying active) to keep memory sharp.   Write a food journal to track blood sugar,  take blood sugar in evening as well and document, bring data to upcoming PCP appointment.  I have personally reviewed and noted the following in the patient's chart:   . Medical and social history . Use of alcohol, tobacco or illicit drugs  . Current medications and supplements . Functional ability and status . Nutritional status . Physical activity . Advanced directives . List of other physicians . Hospitalizations,  surgeries, and ER visits in previous 12 months . Vitals . Screenings to include cognitive, depression, and falls . Referrals and appointments  In addition, I have reviewed and discussed with patient certain preventive protocols, quality metrics, and best practice recommendations. A written personalized care plan for preventive services as well as general preventive health recommendations were provided to patient.     Michiel Cowboy, RN  07/16/2016  Medical screening examination/treatment/procedure(s) were performed by non-physician practitioner and as supervising physician I was immediately available for consultation/collaboration. I agree with above. Cathlean Cower, MD

## 2016-07-16 NOTE — Progress Notes (Signed)
Pre visit review using our clinic review tool, if applicable. No additional management support is needed unless otherwise documented below in the visit note. 

## 2016-07-17 ENCOUNTER — Ambulatory Visit (INDEPENDENT_AMBULATORY_CARE_PROVIDER_SITE_OTHER): Payer: Medicare Other | Admitting: *Deleted

## 2016-07-17 VITALS — BP 124/68 | HR 64 | Resp 20 | Ht 65.0 in | Wt 197.0 lb

## 2016-07-17 DIAGNOSIS — Z0000 Encounter for general adult medical examination without abnormal findings: Secondary | ICD-10-CM

## 2016-07-17 NOTE — Patient Instructions (Addendum)
Continue to eat heart healthy diet (full of fruits, vegetables, whole grains, lean protein, water--limit salt, fat, and sugar intake) and increase physical activity as tolerated.  Continue doing brain stimulating activities (puzzles, reading, adult coloring books, staying active) to keep memory sharp.   Write a food journal to track blood sugar,  take blood sugar in evening as well and document, bring data to upcoming PCP appointment.   [name] , Thank you for taking time to come for your Medicare Wellness Visit. I appreciate your ongoing commitment to your health goals. Please review the following plan we discussed and let me know if I can assist you in the future.   These are the goals we discussed: Goals    . lose 10 pounds          Continue to exercise,  eat healthy diet and Increase water intake.       This is a list of the screening recommended for you and due dates:  Health Maintenance  Topic Date Due  . Complete foot exam   07/19/2015  . Eye exam for diabetics  04/23/2016  . Hemoglobin A1C  07/20/2016  . Flu Shot  10/21/2016  . Tetanus Vaccine  04/02/2018  . Pneumonia vaccines  Completed    High-Fiber Diet Fiber, also called dietary fiber, is a type of carbohydrate found in fruits, vegetables, whole grains, and beans. A high-fiber diet can have many health benefits. Your health care provider may recommend a high-fiber diet to help:  Prevent constipation. Fiber can make your bowel movements more regular.  Lower your cholesterol.  Relieve hemorrhoids, uncomplicated diverticulosis, or irritable bowel syndrome.  Prevent overeating as part of a weight-loss plan.  Prevent heart disease, type 2 diabetes, and certain cancers. What is my plan? The recommended daily intake of fiber includes:  38 grams for men under age 40.  60 grams for men over age 46.  61 grams for women under age 36.  52 grams for women over age 104. You can get the recommended daily intake of  dietary fiber by eating a variety of fruits, vegetables, grains, and beans. Your health care provider may also recommend a fiber supplement if it is not possible to get enough fiber through your diet. What do I need to know about a high-fiber diet?  Fiber supplements have not been widely studied for their effectiveness, so it is better to get fiber through food sources.  Always check the fiber content on thenutrition facts label of any prepackaged food. Look for foods that contain at least 5 grams of fiber per serving.  Ask your dietitian if you have questions about specific foods that are related to your condition, especially if those foods are not listed in the following section.  Increase your daily fiber consumption gradually. Increasing your intake of dietary fiber too quickly may cause bloating, cramping, or gas.  Drink plenty of water. Water helps you to digest fiber. What foods can I eat? Grains  Whole-grain breads. Multigrain cereal. Oats and oatmeal. Brown rice. Barley. Bulgur wheat. [place]. Bran muffins. Popcorn. Rye wafer crackers. Vegetables  Sweet potatoes. Spinach. Kale. Artichokes. Cabbage. Broccoli. Green peas. Carrots. Squash. Fruits  Berries. Pears. Apples. Oranges. Avocados. Prunes and raisins. Dried figs. Meats and Other Protein Sources  [place], kidney, pinto, and soy beans. Split peas. Lentils. Nuts and seeds. Dairy  Fiber-fortified yogurt. Beverages  Fiber-fortified soy milk. Fiber-fortified orange juice. Other  Fiber bars. The items listed above may not be a complete list  of recommended foods or beverages. Contact your dietitian for more options.  What foods are not recommended? Grains  White bread. Pasta made with refined flour. White rice. Vegetables  Fried potatoes. Canned vegetables. Well-cooked vegetables. Fruits  Fruit juice. Cooked, strained fruit. Meats and Other Protein Sources  Fatty cuts of meat. Fried Sales executive or fried fish. Dairy  Milk. Yogurt.  Cream cheese. Sour cream. Beverages  Soft drinks. Other  Cakes and pastries. Butter and oils. The items listed above may not be a complete list of foods and beverages to avoid. Contact your dietitian for more information.  What are some tips for including high-fiber foods in my diet?  Eat a wide variety of high-fiber foods.  Make sure that half of all grains consumed each day are whole grains.  Replace breads and cereals made from refined flour or white flour with whole-grain breads and cereals.  Replace white rice with brown rice, bulgur wheat, or millet.  Start the day with a breakfast that is high in fiber, such as a cereal that contains at least 5 grams of fiber per serving.  Use beans in place of meat in soups, salads, or pasta.  Eat high-fiber snacks, such as berries, raw vegetables, nuts, or popcorn. This information is not intended to replace advice given to you by your health care provider. Make sure you discuss any questions you have with your health care provider. Document Released: 03/09/2005 Document Revised: 08/15/2015 Document Reviewed: 08/22/2013 Elsevier Interactive Patient Education  2017 [employer]   Fat and Cholesterol Restricted Diet Getting too much fat and cholesterol in your diet may cause health problems. Following this diet helps keep your fat and cholesterol at normal levels. This can keep you from getting sick. What types of fat should I choose?  Choose monosaturated and polyunsaturated fats. These are found in foods such as olive oil, canola oil, flaxseeds, walnuts, almonds, and seeds.  Eat more omega-3 fats. Good choices include salmon, mackerel, sardines, tuna, flaxseed oil, and ground flaxseeds.  Limit saturated fats. These are in animal products such as meats, butter, and cream. They can also be in plant products such as palm oil, palm kernel oil, and coconut oil.  Avoid foods with partially hydrogenated oils in them. These contain trans fats.  Examples of foods that have trans fats are stick margarine, some tub margarines, cookies, crackers, and other baked goods. What general guidelines do I need to follow?  Check food labels. Look for the words "trans fat" and "saturated fat."  When preparing a meal:  Fill half of your plate with vegetables and green salads.  Fill one fourth of your plate with whole grains. Look for the word "whole" as the first word in the ingredient list.  Fill one fourth of your plate with lean protein foods.  Eat more foods that have fiber, like apples, carrots, beans, peas, and barley.  Eat more home-cooked foods. Eat less at restaurants and buffets.  Limit or avoid alcohol.  Limit foods high in starch and sugar.  Limit fried foods.  Cook foods without frying them. Baking, boiling, grilling, and broiling are all great options.  Lose weight if you are overweight. Losing even a small amount of weight can help your overall health. It can also help prevent diseases such as diabetes and heart disease. What foods can I eat? Grains  Whole grains, such as whole wheat or whole grain breads, crackers, cereals, and pasta. Unsweetened oatmeal, bulgur, barley, quinoa, or brown rice. Corn or whole  wheat flour tortillas. Vegetables  Fresh or frozen vegetables (raw, steamed, roasted, or grilled). Green salads. Fruits  All fresh, canned (in natural juice), or frozen fruits. Meat and Other Protein Products  Ground beef (85% or leaner), grass-fed beef, or beef trimmed of fat. Skinless chicken or [place]. Ground chicken or [place]. Pork trimmed of fat. All fish and seafood. Eggs. Dried beans, peas, or lentils. Unsalted nuts or seeds. Unsalted canned or dry beans. Dairy  Low-fat dairy products, such as skim or 1% milk, 2% or reduced-fat cheeses, low-fat ricotta or cottage cheese, or plain low-fat yogurt. Fats and Oils  Tub margarines without trans fats. Light or reduced-fat mayonnaise and salad dressings. Avocado.  Olive, canola, sesame, or safflower oils. Natural peanut or almond butter (choose ones without added sugar and oil). The items listed above may not be a complete list of recommended foods or beverages. Contact your dietitian for more options.  What foods are not recommended? Grains  White bread. White pasta. White rice. Cornbread. Bagels, pastries, and croissants. Crackers that contain trans fat. Vegetables  White potatoes. Corn. Creamed or fried vegetables. Vegetables in a cheese sauce. Fruits  Dried fruits. Canned fruit in light or heavy syrup. Fruit juice. Meat and Other Protein Products  Fatty cuts of meat. Ribs, chicken wings, bacon, sausage, bologna, salami, chitterlings, fatback, hot dogs, bratwurst, and packaged luncheon meats. Liver and organ meats. Dairy  Whole or 2% milk, cream, half-and-half, and cream cheese. Whole milk cheeses. Whole-fat or sweetened yogurt. Full-fat cheeses. Nondairy creamers and whipped toppings. Processed cheese, cheese spreads, or cheese curds. Sweets and Desserts  Corn syrup, sugars, honey, and molasses. Candy. Jam and jelly. Syrup. Sweetened cereals. Cookies, pies, cakes, donuts, muffins, and ice cream. Fats and Oils  Butter, stick margarine, lard, shortening, ghee, or bacon fat. Coconut, palm kernel, or palm oils. Beverages  Alcohol. Sweetened drinks (such as sodas, lemonade, and fruit drinks or punches). The items listed above may not be a complete list of foods and beverages to avoid. Contact your dietitian for more information.  This information is not intended to replace advice given to you by your health care provider. Make sure you discuss any questions you have with your health care provider. Document Released: 09/08/2011 Document Revised: 11/14/2015 Document Reviewed: 06/08/2013 Elsevier Interactive Patient Education  2017 [place] that are packaged or in containers have a Nutrition Facts panel on the side or back.  This is commonly called the food label. The food label helps you make healthy food choices by providing information about serving size and the amount of calories and various nutrients in the food. You can check the food label to find out if the food contains high or low amounts of nutrients that you want to limit in your diet. You can also use the food label to see if the food is a good source of the nutrients that you want to make sure are included in your diet. How do I read the food label?  Begin by checking the serving size and number of servings in the container. All of the nutrition information listed on the food label is based on one serving. If you eat more than one serving, you must multiply the amounts (such as calories, grams of saturated fat, or milligrams of sodium) by the number of servings.  Check the calories. Choosing foods that are low in calories can help you manage your weight.  Look at the numbers in the % Daily Value column  for each listed nutrient. This gives you an idea of how much of the daily recommended amount for that nutrient is provided in one serving of the food. A daily value of 5% or less is considered low. A daily value of 20% or more is considered high.  Check the amounts for the items you should limit in your diet. These include:  Total fat.  Saturated fat.  Trans fat.  Cholesterol.  Sodium.  Check the amounts for the items you should make sure you get enough of. These include:  Dietary fiber.  Vitamins A and C.  Calcium.  Iron. What information is provided on the food label? Serving information   Serving size.  The serving size is listed in cups or pieces. The nutrient amounts listed on the food label apply to this amount of the food.  Servings per container or package.  This shows the number of servings you can expect to get from the container or package if you follow the suggested serving size. Amount per serving   Calories.  The  number of calories in one serving of the food. This information is helpful in managing weight. Low-calorie foods contain 40 calories or less. High-calorie foods contain 400 or more calories.  Calories from fat.  The number of calories that come from fat in one serving. Percent daily value  Percent daily value (shown on the label as % Daily Value) tells you what percent of the daily value for each nutrient one serving provides. The daily value is the recommended amount of the nutrient that you should get each day. For example, if 15% is listed next to dietary fiber, it means that one serving of the food will give you 15% of the recommended amount of fiber that you should get in a day. The daily values are based on a 2,000-calorie-per-day diet. You may get more or less than 2,000 calories in your diet each day, but the % Daily Value gives you an idea of whether the food contains a high or low amount of the listed nutrient. A daily value of 5% or less is low. A daily value of 20% or more is high. Total fat  Total fat shows you the total amount of fat in one serving (listed in grams). Foods with high amounts of fat usually have higher calories and may lead to weight gain. Two of the fats that make up a portion of the total fat are included on the label:  Saturated fat.  This number is the amount of saturated fat in one serving (listed in grams). Saturated fat increases the amount of blood cholesterol and should be limited to less than 7% of total calories each day. This means that if you eat 2,000 calories each day, you should eat less than 140 calories from saturated fat.   Cholesterol  The amount of cholesterol in one serving is listed in milligrams. Cholesterol should be limited to no more than 300 mg each day. Sodium  The amount of sodium in one serving is listed in milligrams. Most people should limit their sodium intake to 2,300 mg a day. Total carbohydrate  This number shows the amount of  total carbohydrate in one serving (listed in grams). This information can help people with diabetes manage the amount of carbohydrate they eat. Two of the carbohydrates that make up a portion of the total carbohydrate are included on the label:  Dietary fiber.  The amount of dietary fiber in one serving is listed  in grams. Most people should eat at least 25 g of dietary fiber each day.  Sugars.  The amount of sugar in one serving is also listed in grams. This value includes both naturally occurring sugars from fruit and milk and added sugars such as honey or table sugar. Protein  The amount of protein in one serving is listed in grams. What other important labeling is on the food package? Ingredients  Food labels will list each ingredient in the food. The first ingredient listed is the ingredient that the food has the most of. The ingredients are listed in the order of their amount by weight from highest to lowest. Food allergen labeling  Food labels may also include a food allergen warning. Listed here are ingredients that can cause allergic reactions in some people. The potential allergens are listed behind the word "Contains" or "May contain." Examples of ingredients that may be listed are wheat, dairy, eggs, soy, and nuts. If a person knows that he or she is allergic to one of these ingredients, he or she will know to avoid that food. Where to find more information:  [place] Food and Drug Administration: [place] This information is not intended to replace advice given to you by your health care provider. Make sure you discuss any questions you have with your health care provider. Document Released: 03/09/2005 Document Revised: 11/06/2015 Document Reviewed: 01/30/2013 Elsevier Interactive Patient Education  2017 [place] for Diabetes Mellitus, Adult Carbohydrate counting is a method for keeping track of how many carbohydrates you eat. Eating carbohydrates  naturally increases the amount of sugar (glucose) in the blood. Counting how many carbohydrates you eat helps keep your blood glucose within normal limits, which helps you manage your diabetes (diabetes mellitus). It is important to know how many carbohydrates you can safely have in each meal. This is different for every person. A diet and nutrition specialist (registered dietitian) can help you make a meal plan and calculate how many carbohydrates you should have at each meal and snack. Carbohydrates are found in the following foods:  Grains, such as breads and cereals.  Dried beans and soy products.  Starchy vegetables, such as potatoes, peas, and corn.  Fruit and fruit juices.  Milk and yogurt.  Sweets and snack foods, such as cake, cookies, candy, chips, and soft drinks. How do I count carbohydrates? There are two ways to count carbohydrates in food. You can use either of the methods or a combination of both. Reading "Nutrition Facts" on packaged food  The "Nutrition Facts" list is included on the labels of almost all packaged foods and beverages in the [place] It includes:  The serving size.  Information about nutrients in each serving, including the grams (g) of carbohydrate per serving. To use the "Nutrition Facts":  Decide how many servings you will have.  Multiply the number of servings by the number of carbohydrates per serving.  The resulting number is the total amount of carbohydrates that you will be having. Learning standard serving sizes of other foods  When you eat foods containing carbohydrates that are not packaged or do not include "Nutrition Facts" on the label, you need to measure the servings in order to count the amount of carbohydrates:  Measure the foods that you will eat with a food scale or measuring cup, if needed.  Decide how many standard-size servings you will eat.  Multiply the number of servings by 15. Most carbohydrate-rich foods have about 15 g of  carbohydrates per serving.  For example, if you eat 8 oz (170 g) of strawberries, you will have eaten 2 servings and 30 g of carbohydrates (2 servings x 15 g = 30 g).  For foods that have more than one food mixed, such as soups and casseroles, you must count the carbohydrates in each food that is included. The following list contains standard serving sizes of common carbohydrate-rich foods. Each of these servings has about 15 g of carbohydrates:   hamburger bun or  English muffin.   oz (15 mL) syrup.   oz (14 g) jelly.  1 slice of bread.  1 six-inch tortilla.  3 oz (85 g) cooked rice or pasta.  4 oz (113 g) cooked dried beans.  4 oz (113 g) starchy vegetable, such as peas, corn, or potatoes.  4 oz (113 g) hot cereal.  4 oz (113 g) mashed potatoes or  of a large baked potato.  4 oz (113 g) canned or frozen fruit.  4 oz (120 mL) fruit juice.  4-6 crackers.  6 chicken nuggets.  6 oz (170 g) unsweetened dry cereal.  6 oz (170 g) plain fat-free yogurt or yogurt sweetened with artificial sweeteners.  8 oz (240 mL) milk.  8 oz (170 g) fresh fruit or one small piece of fruit.  24 oz (680 g) popped popcorn. Example of carbohydrate counting Sample meal   3 oz (85 g) chicken breast.  6 oz (170 g) brown rice.  4 oz (113 g) corn.  8 oz (240 mL) milk.  8 oz (170 g) strawberries with sugar-free whipped topping. Carbohydrate calculation  1. Identify the foods that contain carbohydrates:  Rice.  Corn.  Milk.  Strawberries. 2. Calculate how many servings you have of each food:  2 servings rice.  1 serving corn.  1 serving milk.  1 serving strawberries. 3. Multiply each number of servings by 15 g:  2 servings rice x 15 g = 30 g.  1 serving corn x 15 g = 15 g.  1 serving milk x 15 g = 15 g.  1 serving strawberries x 15 g = 15 g. 4. Add together all of the amounts to find the total grams of carbohydrates eaten:  30 g + 15 g + 15 g + 15 g = 75 g of  carbohydrates total. This information is not intended to replace advice given to you by your health care provider. Make sure you discuss any questions you have with your health care provider. Document Released: 03/09/2005 Document Revised: 09/27/2015 Document Reviewed: 08/21/2015 Elsevier Interactive Patient Education  2017 [place] DASH stands for "Dietary Approaches to Stop Hypertension." The DASH eating plan is a healthy eating plan that has been shown to reduce high blood pressure (hypertension). It may also reduce your risk for type 2 diabetes, heart disease, and stroke. The DASH eating plan may also help with weight loss. What are tips for following this plan? General guidelines   Avoid eating more than 2,300 mg (milligrams) of salt (sodium) a day. If you have hypertension, you may need to reduce your sodium intake to 1,500 mg a day.  Limit alcohol intake to no more than 1 drink a day for nonpregnant women and 2 drinks a day for men. One drink equals 12 oz of beer, 5 oz of Jonnie Kubly, or 1 oz of hard liquor.  Work with your health care provider to maintain a healthy body weight or to lose weight. Ask what  an ideal weight is for you.  Get at least 30 minutes of exercise that causes your heart to beat faster (aerobic exercise) most days of the week. Activities may include walking, swimming, or biking.  Work with your health care provider or diet and nutrition specialist (dietitian) to adjust your eating plan to your individual calorie needs. Reading food labels   Check food labels for the amount of sodium per serving. Choose foods with less than 5 percent of the Daily Value of sodium. Generally, foods with less than 300 mg of sodium per serving fit into this eating plan.  To find whole grains, look for the word "whole" as the first word in the ingredient list. Shopping   Buy products labeled as "low-sodium" or "no salt added."  Buy fresh foods. Avoid canned foods and  premade or frozen meals. Cooking   Avoid adding salt when cooking. Use salt-free seasonings or herbs instead of table salt or sea salt. Check with your health care provider or pharmacist before using salt substitutes.  Do not fry foods. Cook foods using healthy methods such as baking, boiling, grilling, and broiling instead.  Cook with heart-healthy oils, such as olive, canola, soybean, or sunflower oil. Meal planning    Eat a balanced diet that includes:  5 or more servings of fruits and vegetables each day. At each meal, try to fill half of your plate with fruits and vegetables.  Up to 6-8 servings of whole grains each day.  Less than 6 oz of lean meat, poultry, or fish each day. A 3-oz serving of meat is about the same size as a deck of cards. One egg equals 1 oz.  2 servings of low-fat dairy each day.  A serving of nuts, seeds, or beans 5 times each week.  Heart-healthy fats. Healthy fats called Omega-3 fatty acids are found in foods such as flaxseeds and coldwater fish, like sardines, salmon, and mackerel.  Limit how much you eat of the following:  Canned or prepackaged foods.  Food that is high in trans fat, such as fried foods.  Food that is high in saturated fat, such as fatty meat.  Sweets, desserts, sugary drinks, and other foods with added sugar.  Full-fat dairy products.  Do not salt foods before eating.  Try to eat at least 2 vegetarian meals each week.  Eat more home-cooked food and less restaurant, buffet, and fast food.  When eating at a restaurant, ask that your food be prepared with less salt or no salt, if possible. What foods are recommended? The items listed may not be a complete list. Talk with your dietitian about what dietary choices are best for you. Grains  Whole-grain or whole-wheat bread. Whole-grain or whole-wheat pasta. Brown rice. [place]. Bulgur. Whole-grain and low-sodium cereals. Pita bread. Low-fat, low-sodium crackers.  Whole-wheat flour tortillas. Vegetables  Fresh or frozen vegetables (raw, steamed, roasted, or grilled). Low-sodium or reduced-sodium tomato and vegetable juice. Low-sodium or reduced-sodium tomato sauce and tomato paste. Low-sodium or reduced-sodium canned vegetables. Fruits  All fresh, dried, or frozen fruit. Canned fruit in natural juice (without added sugar). Meat and other protein foods  Skinless chicken or [place]. Ground chicken or [place]. Pork with fat trimmed off. Fish and seafood. Egg whites. Dried beans, peas, or lentils. Unsalted nuts, nut butters, and seeds. Unsalted canned beans. Lean cuts of beef with fat trimmed off. Low-sodium, lean deli meat. Dairy  Low-fat (1%) or fat-free (skim) milk. Fat-free, low-fat, or reduced-fat cheeses. Nonfat, low-sodium ricotta  or cottage cheese. Low-fat or nonfat yogurt. Low-fat, low-sodium cheese. Fats and oils  Soft margarine without trans fats. Vegetable oil. Low-fat, reduced-fat, or light mayonnaise and salad dressings (reduced-sodium). Canola, safflower, olive, soybean, and sunflower oils. Avocado. Seasoning and other foods  Herbs. Spices. Seasoning mixes without salt. Unsalted popcorn and pretzels. Fat-free sweets. What foods are not recommended? The items listed may not be a complete list. Talk with your dietitian about what dietary choices are best for you. Grains  Baked goods made with fat, such as croissants, muffins, or some breads. Dry pasta or rice meal packs. Vegetables  Creamed or fried vegetables. Vegetables in a cheese sauce. Regular canned vegetables (not low-sodium or reduced-sodium). Regular canned tomato sauce and paste (not low-sodium or reduced-sodium). Regular tomato and vegetable juice (not low-sodium or reduced-sodium). Angie Fava. Olives. Fruits  Canned fruit in a light or heavy syrup. Fried fruit. Fruit in cream or butter sauce. Meat and other protein foods  Fatty cuts of meat. Ribs. Fried meat. Berniece Salines. Sausage. Bologna and  other processed lunch meats. Salami. Fatback. Hotdogs. Bratwurst. Salted nuts and seeds. Canned beans with added salt. Canned or smoked fish. Whole eggs or egg yolks. Chicken or [place] with skin. Dairy  Whole or 2% milk, cream, and half-and-half. Whole or full-fat cream cheese. Whole-fat or sweetened yogurt. Full-fat cheese. Nondairy creamers. Whipped toppings. Processed cheese and cheese spreads. Fats and oils  Butter. Stick margarine. Lard. Shortening. Ghee. Bacon fat. Tropical oils, such as coconut, palm kernel, or palm oil. Seasoning and other foods  Salted popcorn and pretzels. Onion salt, garlic salt, seasoned salt, table salt, and sea salt. Worcestershire sauce. Tartar sauce. Barbecue sauce. Teriyaki sauce. Soy sauce, including reduced-sodium. Steak sauce. Canned and packaged gravies. Fish sauce. Oyster sauce. Cocktail sauce. Horseradish that you find on the shelf. Ketchup. Mustard. Meat flavorings and tenderizers. Bouillon cubes. Hot sauce and Tabasco sauce. Premade or packaged marinades. Premade or packaged taco seasonings. Relishes. Regular salad dressings. Where to find more information:  National Heart, Lung, and [place]: https://wilson-eaton.com/  American Heart Association: www.heart.org Summary  The DASH eating plan is a healthy eating plan that has been shown to reduce high blood pressure (hypertension). It may also reduce your risk for type 2 diabetes, heart disease, and stroke.  With the DASH eating plan, you should limit salt (sodium) intake to 2,300 mg a day. If you have hypertension, you may need to reduce your sodium intake to 1,500 mg a day.  When on the DASH eating plan, aim to eat more fresh fruits and vegetables, whole grains, lean proteins, low-fat dairy, and heart-healthy fats.  Work with your health care provider or diet and nutrition specialist (dietitian) to adjust your eating plan to your individual calorie needs. This information is not intended to replace advice  given to you by your health care provider. Make sure you discuss any questions you have with your health care provider. Document Released: 02/26/2011 Document Revised: 03/02/2016 Document Reviewed: 03/02/2016 Elsevier Interactive Patient Education  2017 [employer].

## 2016-07-21 ENCOUNTER — Other Ambulatory Visit (INDEPENDENT_AMBULATORY_CARE_PROVIDER_SITE_OTHER): Payer: Medicare Other

## 2016-07-21 ENCOUNTER — Ambulatory Visit (INDEPENDENT_AMBULATORY_CARE_PROVIDER_SITE_OTHER): Payer: Medicare Other | Admitting: Internal Medicine

## 2016-07-21 ENCOUNTER — Encounter: Payer: Self-pay | Admitting: Internal Medicine

## 2016-07-21 VITALS — BP 110/60 | HR 75 | Ht 65.0 in | Wt 198.0 lb

## 2016-07-21 DIAGNOSIS — I10 Essential (primary) hypertension: Secondary | ICD-10-CM | POA: Diagnosis not present

## 2016-07-21 DIAGNOSIS — C61 Malignant neoplasm of prostate: Secondary | ICD-10-CM | POA: Diagnosis not present

## 2016-07-21 DIAGNOSIS — E785 Hyperlipidemia, unspecified: Secondary | ICD-10-CM

## 2016-07-21 DIAGNOSIS — J309 Allergic rhinitis, unspecified: Secondary | ICD-10-CM | POA: Diagnosis not present

## 2016-07-21 DIAGNOSIS — E119 Type 2 diabetes mellitus without complications: Secondary | ICD-10-CM | POA: Diagnosis not present

## 2016-07-21 LAB — LIPID PANEL
Cholesterol: 141 mg/dL (ref 0–200)
HDL: 59.1 mg/dL (ref >39.00)
LDL Cholesterol: 73 mg/dL (ref 0–99)
NonHDL: 82.14
Total CHOL/HDL Ratio: 2
Triglycerides: 47 mg/dL (ref 0.0–149.0)
VLDL: 9.4 mg/dL (ref 0.0–40.0)

## 2016-07-21 LAB — CBC WITH DIFFERENTIAL/PLATELET
Basophils Absolute: 0 10*3/uL (ref 0.0–0.1)
Basophils Relative: 1.1 % (ref 0.0–3.0)
Eosinophils Absolute: 0.1 10*3/uL (ref 0.0–0.7)
Eosinophils Relative: 2.2 % (ref 0.0–5.0)
HCT: 40.6 % (ref 39.0–52.0)
Hemoglobin: 13.3 g/dL (ref 13.0–17.0)
Lymphocytes Relative: 32.4 % (ref 12.0–46.0)
Lymphs Abs: 1.5 10*3/uL (ref 0.7–4.0)
MCHC: 32.9 g/dL (ref 30.0–36.0)
MCV: 89.5 fl (ref 78.0–100.0)
Monocytes Absolute: 0.4 10*3/uL (ref 0.1–1.0)
Monocytes Relative: 9.2 % (ref 3.0–12.0)
Neutro Abs: 2.6 10*3/uL (ref 1.4–7.7)
Neutrophils Relative %: 55.1 % (ref 43.0–77.0)
Platelets: 240 10*3/uL (ref 150.0–400.0)
RBC: 4.53 Mil/uL (ref 4.22–5.81)
RDW: 15.4 % (ref 11.5–15.5)
WBC: 4.6 10*3/uL (ref 4.0–10.5)

## 2016-07-21 LAB — HEPATIC FUNCTION PANEL
ALT: 32 U/L (ref 0–53)
AST: 29 U/L (ref 0–37)
Albumin: 4.2 g/dL (ref 3.5–5.2)
Alkaline Phosphatase: 57 U/L (ref 39–117)
Bilirubin, Direct: 0.1 mg/dL (ref 0.0–0.3)
Total Bilirubin: 0.5 mg/dL (ref 0.2–1.2)
Total Protein: 6.5 g/dL (ref 6.0–8.3)

## 2016-07-21 LAB — URINALYSIS, ROUTINE W REFLEX MICROSCOPIC
Bilirubin Urine: NEGATIVE
Hgb urine dipstick: NEGATIVE
Ketones, ur: NEGATIVE
Leukocytes, UA: NEGATIVE
Nitrite: NEGATIVE
RBC / HPF: NONE SEEN (ref 0–?)
Specific Gravity, Urine: <=1.005 — AB (ref 1.000–1.030)
Total Protein, Urine: NEGATIVE
Urine Glucose: NEGATIVE
Urobilinogen, UA: 0.2 (ref 0.0–1.0)
WBC, UA: NONE SEEN (ref 0–?)
pH: 6 (ref 5.0–8.0)

## 2016-07-21 LAB — MICROALBUMIN / CREATININE URINE RATIO
Creatinine,U: 38.6 mg/dL
Microalb Creat Ratio: 1.8 mg/g (ref 0.0–30.0)
Microalb, Ur: <0.7 mg/dL (ref 0.0–1.9)

## 2016-07-21 LAB — BASIC METABOLIC PANEL
BUN: 33 mg/dL — ABNORMAL HIGH (ref 6–23)
CO2: 32 mEq/L (ref 19–32)
Calcium: 9.8 mg/dL (ref 8.4–10.5)
Chloride: 105 mEq/L (ref 96–112)
Creatinine, Ser: 1.29 mg/dL (ref 0.40–1.50)
GFR: 69.32 mL/min (ref >60.00)
Glucose, Bld: 80 mg/dL (ref 70–99)
Potassium: 4.1 mEq/L (ref 3.5–5.1)
Sodium: 143 mEq/L (ref 135–145)

## 2016-07-21 LAB — TSH: TSH: 1.2 u[IU]/mL (ref 0.35–4.50)

## 2016-07-21 LAB — HEMOGLOBIN A1C: Hgb A1c MFr Bld: 6.9 % — ABNORMAL HIGH (ref 4.6–6.5)

## 2016-07-21 LAB — PSA: PSA: 4.38 ng/mL — ABNORMAL HIGH (ref 0.10–4.00)

## 2016-07-21 MED ORDER — LOSARTAN POTASSIUM-HCTZ 50-12.5 MG PO TABS: 1.0000 | ORAL_TABLET | Freq: Every day | ORAL | 3 refills | Status: DC

## 2016-07-21 MED ORDER — OMEPRAZOLE 40 MG PO CPDR: 40.0000 mg | DELAYED_RELEASE_CAPSULE | Freq: Every day | ORAL | 3 refills | Status: DC

## 2016-07-21 MED ORDER — CETIRIZINE HCL 10 MG PO TABS: 10.0000 mg | ORAL_TABLET | Freq: Every day | ORAL | 11 refills | Status: DC

## 2016-07-21 MED ORDER — TRIAMCINOLONE ACETONIDE 55 MCG/ACT NA AERO: 2.0000 | INHALATION_SPRAY | Freq: Every day | NASAL | 12 refills | Status: DC

## 2016-07-21 NOTE — Assessment & Plan Note (Signed)
asympt, s/p surveil biopsy July 2017, pt requests f/u psa today

## 2016-07-21 NOTE — Assessment & Plan Note (Signed)
Mild to mod, for zyrtec/nasacort asd,  to f/u any worsening symptoms or concerns 

## 2016-07-21 NOTE — Assessment & Plan Note (Signed)
Unclear if cough was truly due to ACE, o/w stable overall by history and exam, recent data reviewed with pt, and pt to continue medical treatment as before,  to f/u any worsening symptoms or concerns

## 2016-07-21 NOTE — Patient Instructions (Signed)
OK to take the zyrtec and nasacort for allergies  Please continue all other medications as before, and refills have been done if requested.  Please have the pharmacy call with any other refills you may need.  Please continue your efforts at being more active, low cholesterol diet, and weight control.  You are otherwise up to date with prevention measures today.  Please keep your appointments with your specialists as you may have planned  Please go to the LAB in the Basement (turn left off the elevator) for the tests to be done today  You will be contacted by phone if any changes need to be made immediately.  Otherwise, you will receive a letter about your results with an explanation, but please check with MyChart first.  Please remember to sign up for MyChart if you have not done so, as this will be important to you in the future with finding out test results, communicating by private email, and scheduling acute appointments online when needed.  Please return in 6 months, or sooner if needed

## 2016-07-21 NOTE — Progress Notes (Signed)
Subjective:    Patient ID: Allen Hunter., male    DOB: [birth year], 76 y.o.   MRN: [id number]  HPI  Here for yearly f/u;  Overall doing ok;  Pt denies Chest pain, worsening SOB, DOE, wheezing, orthopnea, PND, worsening LE edema, palpitations, dizziness or syncope.  Pt denies neurological change such as new headache, facial or extremity weakness.  Pt denies polydipsia, polyuria, or low sugar symptoms. Pt states overall good compliance with treatment and medications, good tolerability, and has been trying to follow appropriate diet.  Pt denies worsening depressive symptoms, suicidal ideation or panic. No fever, night sweats, wt loss, loss of appetite, or other constitutional symptoms.  Pt states good ability with ADL's, has low fall risk, home safety reviewed and adequate, no other significant changes in hearing or vision, and active with exercise, at least 45 min per day. Blodo sugars still well controlled with wt control, diet - no OHA.. BP Readings from Last 3 Encounters:  07/21/16 110/60  07/17/16 124/68  01/21/16 128/72   Wt Readings from Last 3 Encounters:  07/21/16 198 lb (89.8 kg)  07/17/16 197 lb (89.4 kg)  01/21/16 194 lb 12 oz (88.3 kg)  Did have a cough recently, seen at [facility] clinic who suggested med may be causing the cough.  Has been ongoing for 6 mo, med changed to losartanHCT 50/12.5 from benazepril-amlodipine but cough persists, though he thinks overall less severe and less frequent, and non productive.   CXR was OK per pt there.  [facility]  Does have several wks ongoing nasal allergy symptoms with clearish congestion, itch and sneezing, without fever, pain, ST, cough, swelling or wheezing.  Also reports he had a colonoscopy approx 1 yr ago - no polyps, just cannot recall where he had it done Denies urinary symptoms such as dysuria, frequency, urgency, flank pain, hematuria or n/v, fever, chills.  Has seen Dr Borden/urology q 6 mo with f/u  psa. Past Medical History:  Diagnosis Date  . ALLERGIC RHINITIS 10/04/2008  . ASTHMA 10/04/2008  . BACK PAIN 04/18/2008  . BENIGN PROSTATIC HYPERTROPHY 03/09/2007  . COLONIC POLYPS, HX OF 03/09/2007  . COPD 10/20/2007  . DIABETES MELLITUS, TYPE II 03/09/2007  . FATIGUE 10/05/2007  . GERD (gastroesophageal reflux disease) 03/01/2012  . GOUT 10/19/2006  . HYPERLIPIDEMIA 10/19/2006  . HYPERTENSION 10/19/2006  . OSA (obstructive sleep apnea) 07/19/2015  . Prostate cancer ([place]) 01/19/2012  . SWELLING MASS OR LUMP IN HEAD AND NECK 01/09/2010   Past Surgical History:  Procedure Laterality Date  . APPENDECTOMY      reports that he has quit smoking. He has never used smokeless tobacco. He reports that he does not drink alcohol. His drug history is not on file. family history includes Cancer in his other; Hypertension in his mother. No Known Allergies Current Outpatient Prescriptions on File Prior to Visit  Medication Sig Dispense Refill  . albuterol (PROVENTIL HFA;VENTOLIN HFA) 108 (90 BASE) MCG/ACT inhaler Inhale 2 puffs into the lungs 4 (four) times daily. 3 Inhaler 3  . allopurinol (ZYLOPRIM) 300 MG tablet TAKE 1 TABLET DAILY 90 tablet 2  . aspirin 81 MG tablet Take 81 mg by mouth daily.      Marland Kitchen atorvastatin (LIPITOR) 20 MG tablet Take 1 tablet (20 mg total) by mouth daily. 90 tablet 3  . Blood Glucose Monitoring Suppl (FREESTYLE FREEDOM LITE) W/DEVICE KIT Use asd, 1 device, 250.02 1 each 0  . budesonide-formoterol (SYMBICORT) 80-4.5  MCG/ACT inhaler Inhale 2 puffs into the lungs 2 (two) times daily.    . Fluticasone-Salmeterol (ADVAIR DISKUS) 250-50 MCG/DOSE AEPB Inhale 1 puff into the lungs 2 (two) times daily. 3 each 3  . FREESTYLE LITE test strip USE TO CHECK BLOOD SUGAR ONCE DAILY AS DIRECTED 300 each 2  . Lancets MISC Use asd 1 per day 250.02 100 each 11  . losartan-hydrochlorothiazide (HYZAAR) 50-12.5 MG tablet 50 tablets daily.    . montelukast (SINGULAIR) 10 MG tablet TAKE 1 TABLET  (10MG TOTAL ) BY MOUTH AT BEDTIME 90 tablet 2  . NEXIUM 40 MG capsule Take 1 capsule (40 mg total) by mouth daily. 90 capsule 3  . Omega-3 Fatty Acids (FISH OIL) 500 MG CAPS Take by mouth daily.      . vardenafil (LEVITRA) 20 MG tablet 1 by mouth every other day as needed     . VIAGRA 100 MG tablet TAKE ONE-HALF (1/2) TO ONE TABLET (50 MG TO 100 MG) DAILY AS NEEDED FOR ERECTILE DYSFUNCTION 18 tablet 0   No current facility-administered medications on file prior to visit.    Review of Systems Constitutional: Negative for other unusual diaphoresis, sweats, appetite or weight changes HENT: Negative for other worsening hearing loss, ear pain, facial swelling, mouth sores or neck stiffness.   Eyes: Negative for other worsening pain, redness or other visual disturbance.  Respiratory: Negative for other stridor or swelling Cardiovascular: Negative for other palpitations or other chest pain  Gastrointestinal: Negative for worsening diarrhea or loose stools, blood in stool, distention or other pain Genitourinary: Negative for hematuria, flank pain or other change in urine volume.  Musculoskeletal: Negative for myalgias or other joint swelling.  Skin: Negative for other color change, or other wound or worsening drainage.  Neurological: Negative for other syncope or numbness. Hematological: Negative for other adenopathy or swelling Psychiatric/Behavioral: Negative for hallucinations, other worsening agitation, SI, self-injury, or new decreased concentration All other system neg per pt    Objective:   Physical Exam BP 110/60   Pulse 75   Ht '5\' 5"'  (1.651 m)   Wt 198 lb (89.8 kg)   SpO2 98%   BMI 32.95 kg/m  VS noted,  Constitutional: Pt is oriented to person, place, and time. Appears well-developed and well-nourished, in no significant distress and comfortable Head: Normocephalic and atraumatic  Eyes: Conjunctivae and EOM are normal. Pupils are equal, round, and reactive to light Right Ear:  External ear normal without discharge Left Ear: External ear normal without discharge Nose: Nose without discharge or deformity Mouth/Throat: Oropharynx is without other ulcerations and moist  Neck: Normal range of motion. Neck supple. No JVD present. No tracheal deviation present or significant neck LA or mass Cardiovascular: Normal rate, regular rhythm, normal heart sounds and intact distal pulses.   Pulmonary/Chest: WOB normal and breath sounds without rales or wheezing  Abdominal: Soft. Bowel sounds are normal. NT. No HSM  Musculoskeletal: Normal range of motion. Exhibits no edema Lymphadenopathy: Has no other cervical adenopathy.  Neurological: Pt is alert and oriented to person, place, and time. Pt has normal reflexes. No cranial nerve deficit. Motor grossly intact, Gait intact, but appears to have some cognitive slowing and few memory lapses in short term recall Skin: Skin is warm and dry. No rash noted or new ulcerations Psychiatric:  Has normal mood and affect. Behavior is normal without agitation No other exam findings    Assessment & Plan:

## 2016-07-21 NOTE — Progress Notes (Signed)
Pre visit review using our clinic review tool, if applicable. No additional management support is needed unless otherwise documented below in the visit note. 

## 2016-07-21 NOTE — Assessment & Plan Note (Signed)
On higher dose statin since last visit and back to DM Diet, for f/u lab today, cont same tx

## 2016-07-23 ENCOUNTER — Telehealth: Payer: Self-pay | Admitting: Internal Medicine

## 2016-07-23 NOTE — Telephone Encounter (Signed)
Pt called for test result from 5/1.

## 2016-07-28 DIAGNOSIS — G4733 Obstructive sleep apnea (adult) (pediatric): Secondary | ICD-10-CM | POA: Diagnosis not present

## 2016-07-28 DIAGNOSIS — R05 Cough: Secondary | ICD-10-CM | POA: Diagnosis not present

## 2016-07-28 DIAGNOSIS — T464X5A Adverse effect of angiotensin-converting-enzyme inhibitors, initial encounter: Secondary | ICD-10-CM | POA: Diagnosis not present

## 2016-08-03 DIAGNOSIS — L249 Irritant contact dermatitis, unspecified cause: Secondary | ICD-10-CM | POA: Diagnosis not present

## 2016-09-21 DIAGNOSIS — T550X1A Toxic effect of soaps, accidental (unintentional), initial encounter: Secondary | ICD-10-CM | POA: Diagnosis not present

## 2016-09-27 ENCOUNTER — Other Ambulatory Visit: Payer: Self-pay | Admitting: Internal Medicine

## 2016-09-30 ENCOUNTER — Ambulatory Visit (INDEPENDENT_AMBULATORY_CARE_PROVIDER_SITE_OTHER): Payer: Medicare Other | Admitting: Internal Medicine

## 2016-09-30 VITALS — BP 122/88 | HR 65 | Ht 65.0 in | Wt 197.0 lb

## 2016-09-30 DIAGNOSIS — E785 Hyperlipidemia, unspecified: Secondary | ICD-10-CM | POA: Diagnosis not present

## 2016-09-30 DIAGNOSIS — I10 Essential (primary) hypertension: Secondary | ICD-10-CM | POA: Diagnosis not present

## 2016-09-30 DIAGNOSIS — E119 Type 2 diabetes mellitus without complications: Secondary | ICD-10-CM | POA: Diagnosis not present

## 2016-09-30 MED ORDER — METFORMIN HCL ER 500 MG PO TB24: 500.0000 mg | ORAL_TABLET | Freq: Every day | ORAL | 3 refills | Status: DC

## 2016-09-30 NOTE — Assessment & Plan Note (Signed)
stable overall by history and exam, recent data reviewed with pt, and pt to continue medical treatment as before,  to f/u any worsening symptoms or concerns Lab Results  Component Value Date   LDLCALC 73 07/21/2016    

## 2016-09-30 NOTE — Assessment & Plan Note (Signed)
Mild uncontrolled for unclear reasons, no s/s infection or other acute illness,; to add metformin ER 500 qd, cont diet, wt control, exercise, cont to monitor cbg's, call if not improved, f/u a1c with next visti

## 2016-09-30 NOTE — Patient Instructions (Signed)
Please take all new medication as prescribed - the metformin ER 500 mg - 1 per day  Please call in 10- 14 days if your sugars are not improved, with the goal being having an average around 130 or less  Please continue all other medications as before, and refills have been done if requested.  Please have the pharmacy call with any other refills you may need.  Please continue your efforts at being more active, low cholesterol diabetic diet, and weight control.  Please keep your appointments with your specialists as you may have planned

## 2016-09-30 NOTE — Assessment & Plan Note (Signed)
stable overall by history and exam, recent data reviewed with pt, and pt to continue medical treatment as before,  to f/u any worsening symptoms or concerns BP Readings from Last 3 Encounters:  09/30/16 122/88  07/21/16 110/60  07/17/16 124/68

## 2016-09-30 NOTE — Progress Notes (Signed)
Subjective:    Patient ID: Allen Hunter., male    DOB: [birth year], 76 y.o.   MRN: [id number]  HPI  Here to f/u, states in the past wk for no apparent reason CBG's often 160, has seen a couple in the 200 and 300's.  Has been historically 130-140 and has not wanted tx to date, but now willing to start.  Denies change in diet, wt or activity level.  No fever or other acute illness symptoms.  Pt denies chest pain, increased sob or doe, wheezing, orthopnea, PND, increased LE swelling, palpitations, dizziness or syncope.  Pt denies new neurological symptoms such as new headache, or facial or extremity weakness or numbness   Wt Readings from Last 3 Encounters:  09/30/16 197 lb (89.4 kg)  07/21/16 198 lb (89.8 kg)  07/17/16 197 lb (89.4 kg)  Still going to the gym every day up to 1 hour.   Past Medical History:  Diagnosis Date  . ALLERGIC RHINITIS 10/04/2008  . ASTHMA 10/04/2008  . BACK PAIN 04/18/2008  . BENIGN PROSTATIC HYPERTROPHY 03/09/2007  . COLONIC POLYPS, HX OF 03/09/2007  . COPD 10/20/2007  . DIABETES MELLITUS, TYPE II 03/09/2007  . FATIGUE 10/05/2007  . GERD (gastroesophageal reflux disease) 03/01/2012  . GOUT 10/19/2006  . HYPERLIPIDEMIA 10/19/2006  . HYPERTENSION 10/19/2006  . OSA (obstructive sleep apnea) 07/19/2015  . Prostate cancer ([place]) 01/19/2012  . SWELLING MASS OR LUMP IN HEAD AND NECK 01/09/2010   Past Surgical History:  Procedure Laterality Date  . APPENDECTOMY      reports that he has quit smoking. He has never used smokeless tobacco. He reports that he does not drink alcohol. His drug history is not on file. family history includes Cancer in his other; Hypertension in his mother. No Known Allergies Current Outpatient Prescriptions on File Prior to Visit  Medication Sig Dispense Refill  . albuterol (PROVENTIL HFA;VENTOLIN HFA) 108 (90 BASE) MCG/ACT inhaler Inhale 2 puffs into the lungs 4 (four) times daily. 3 Inhaler 3  . allopurinol (ZYLOPRIM) 300 MG tablet TAKE 1  TABLET DAILY 90 tablet 2  . aspirin 81 MG tablet Take 81 mg by mouth daily.      Marland Kitchen atorvastatin (LIPITOR) 20 MG tablet Take 1 tablet (20 mg total) by mouth daily. 90 tablet 3  . Blood Glucose Monitoring Suppl (FREESTYLE FREEDOM LITE) W/DEVICE KIT Use asd, 1 device, 250.02 1 each 0  . budesonide-formoterol (SYMBICORT) 80-4.5 MCG/ACT inhaler Inhale 2 puffs into the lungs 2 (two) times daily.    . cetirizine (ZYRTEC) 10 MG tablet Take 1 tablet (10 mg total) by mouth daily. 30 tablet 11  . Fluticasone-Salmeterol (ADVAIR DISKUS) 250-50 MCG/DOSE AEPB Inhale 1 puff into the lungs 2 (two) times daily. 3 each 3  . FREESTYLE LITE test strip USE TO CHECK BLOOD SUGAR ONCE DAILY AS DIRECTED 300 each 2  . Lancets MISC Use asd 1 per day 250.02 100 each 11  . losartan-hydrochlorothiazide (HYZAAR) 50-12.5 MG tablet Take 1 tablet by mouth daily. 90 tablet 3  . montelukast (SINGULAIR) 10 MG tablet TAKE 1 TABLET (10MG TOTAL ) BY MOUTH AT BEDTIME 90 tablet 2  . NEXIUM 40 MG capsule Take 1 capsule (40 mg total) by mouth daily. 90 capsule 3  . Omega-3 Fatty Acids (FISH OIL) 500 MG CAPS Take by mouth daily.      Marland Kitchen omeprazole (PRILOSEC) 40 MG capsule Take 1 capsule (40 mg total) by mouth daily. 90 capsule 3  . triamcinolone (NASACORT  AQ) 55 MCG/ACT AERO nasal inhaler Place 2 sprays into the nose daily. 1 Inhaler 12  . vardenafil (LEVITRA) 20 MG tablet 1 by mouth every other day as needed     . VIAGRA 100 MG tablet TAKE ONE-HALF (1/2) TO ONE TABLET (50 MG TO 100 MG) DAILY AS NEEDED FOR ERECTILE DYSFUNCTION 18 tablet 0   No current facility-administered medications on file prior to visit.    Review of Systems  Constitutional: Negative for other unusual diaphoresis or sweats HENT: Negative for ear discharge or swelling Eyes: Negative for other worsening visual disturbances Respiratory: Negative for stridor or other swelling  Gastrointestinal: Negative for worsening distension or other blood Genitourinary: Negative for  retention or other urinary change Musculoskeletal: Negative for other MSK pain or swelling Skin: Negative for color change or other new lesions Neurological: Negative for worsening tremors and other numbness  Psychiatric/Behavioral: Negative for worsening agitation or other fatigue All other system neg per pt    Objective:   Physical Exam BP 122/88   Pulse 65   Ht '5\' 5"'  (1.651 m)   Wt 197 lb (89.4 kg)   SpO2 97%   BMI 32.78 kg/m  VS noted,  Constitutional: Pt appears in NAD HENT: Head: NCAT.  Right Ear: External ear normal.  Left Ear: External ear normal.  Eyes: . Pupils are equal, round, and reactive to light. Conjunctivae and EOM are normal Nose: without d/c or deformity Neck: Neck supple. Gross normal ROM Cardiovascular: Normal rate and regular rhythm.   Pulmonary/Chest: Effort normal and breath sounds without rales or wheezing.  Neurological: Pt is alert. At baseline orientation, motor grossly intact Skin: Skin is warm. No rashes, other new lesions, no LE edema Psychiatric: Pt behavior is normal without agitation  No other exam findings  Lab Results  Component Value Date   HGBA1C 6.9 (H) 07/21/2016   Lab Results  Component Value Date   WBC 4.6 07/21/2016   HGB 13.3 07/21/2016   HCT 40.6 07/21/2016   PLT 240.0 07/21/2016   GLUCOSE 80 07/21/2016   CHOL 141 07/21/2016   TRIG 47.0 07/21/2016   HDL 59.10 07/21/2016   LDLCALC 73 07/21/2016   ALT 32 07/21/2016   AST 29 07/21/2016   NA 143 07/21/2016   K 4.1 07/21/2016   CL 105 07/21/2016   CREATININE 1.29 07/21/2016   BUN 33 (H) 07/21/2016   CO2 32 07/21/2016   TSH 1.20 07/21/2016   PSA 4.38 (H) 07/21/2016   HGBA1C 6.9 (H) 07/21/2016   MICROALBUR <0.7 07/21/2016       Assessment & Plan:

## 2016-11-06 DIAGNOSIS — C61 Malignant neoplasm of prostate: Secondary | ICD-10-CM | POA: Diagnosis not present

## 2016-11-10 ENCOUNTER — Other Ambulatory Visit: Payer: Self-pay

## 2016-11-10 MED ORDER — ATORVASTATIN CALCIUM 20 MG PO TABS: 20.0000 mg | ORAL_TABLET | Freq: Every day | ORAL | 2 refills | Status: DC

## 2016-11-10 MED ORDER — GLUCOSE BLOOD VI STRP: ORAL_STRIP | 2 refills | Status: DC

## 2016-11-11 ENCOUNTER — Encounter: Payer: Self-pay | Admitting: Family Medicine

## 2016-11-11 ENCOUNTER — Ambulatory Visit (INDEPENDENT_AMBULATORY_CARE_PROVIDER_SITE_OTHER): Payer: Medicare Other | Admitting: Family Medicine

## 2016-11-11 ENCOUNTER — Ambulatory Visit: Payer: Medicare Other | Admitting: Family Medicine

## 2016-11-11 VITALS — BP 118/76 | HR 75 | Temp 98.0°F | Ht 65.0 in | Wt 195.0 lb

## 2016-11-11 DIAGNOSIS — E119 Type 2 diabetes mellitus without complications: Secondary | ICD-10-CM

## 2016-11-11 LAB — POCT GLYCOSYLATED HEMOGLOBIN (HGB A1C): Hemoglobin A1C: 10.3

## 2016-11-11 LAB — GLUCOSE, POCT (MANUAL RESULT ENTRY): POC Glucose: 372 mg/dl — AB (ref 70–99)

## 2016-11-11 MED ORDER — METFORMIN HCL 1000 MG PO TABS: 1000.0000 mg | ORAL_TABLET | Freq: Two times a day (BID) | ORAL | 1 refills | Status: DC

## 2016-11-11 NOTE — Progress Notes (Signed)
Allen Hunter. - 76 y.o. male MRN [id number]  Date of birth: Jun 22, 1940  SUBJECTIVE:  Including CC & ROS.  Chief Complaint  Patient presents with  . Hyperglycemia    patient states that the past three months his blood sugar has been good, but this morning it was 425 after the gym with no food. states he had unsweet tea this morning.   DIABETES Acute on chronic in nature.  Patient was seen on 7/11 for his diabetes. During that visit he has had blood sugars in the 200 to 300 range. Metformin ER 500 was added to the regimen. Reviewed is metabolic panel from 5/1 which was within normal kidney function and electrolytes. Last A1c was 6.9 on 5/1. Point of care glucose testing today shows blood sugar of 372. He reports he checks his blood sugar early in the mornings and it has been 300 range for most days. This is a change for him over the past couple months whereas increased in nature. He denies any changes in his diet or beverages. He is still exercising like normal. He feels like his normal self. He denies any recent illnesses or fevers. He reports drinking lots of water. He reports an increased amount of time to see up in the middle night to urinate.  Medication Compliance: yes  Medication Side Effects  Hypoglycemia: no  Review of Systems  Constitutional: Negative for fever.  Gastrointestinal: Negative for diarrhea.  Endocrine: Positive for polydipsia and polyuria.  Skin: Negative for rash.  Neurological: Negative for weakness.   otherwise negative  HISTORY: Past Medical, Surgical, Social, and Family History Reviewed & Updated per EMR.   Pertinent Historical Findings include:  Past Medical History:  Diagnosis Date  . ALLERGIC RHINITIS 10/04/2008  . ASTHMA 10/04/2008  . BACK PAIN 04/18/2008  . BENIGN PROSTATIC HYPERTROPHY 03/09/2007  . COLONIC POLYPS, HX OF 03/09/2007  . COPD 10/20/2007  . DIABETES MELLITUS, TYPE II 03/09/2007  . FATIGUE 10/05/2007  . GERD (gastroesophageal reflux  disease) 03/01/2012  . GOUT 10/19/2006  . HYPERLIPIDEMIA 10/19/2006  . HYPERTENSION 10/19/2006  . OSA (obstructive sleep apnea) 07/19/2015  . Prostate cancer ([place]) 01/19/2012  . SWELLING MASS OR LUMP IN HEAD AND NECK 01/09/2010    Past Surgical History:  Procedure Laterality Date  . APPENDECTOMY      No Known Allergies  Family History  Problem Relation Age of Onset  . Hypertension Mother   . Cancer Other      Social History   Social History  . Marital status: Single    Spouse name: N/A  . Number of children: N/A  . Years of education: N/A   Occupational History  . retired [employer] aug 2009    Social History Main Topics  . Smoking status: Former Research scientist (life sciences)  . Smokeless tobacco: Never Used  . Alcohol use No  . Drug use: Unknown  . Sexual activity: Not on file   Other Topics Concern  . Not on file   Social History Narrative  . No narrative on file     PHYSICAL EXAM:  VS: BP 118/76 (BP Location: Left Arm, Patient Position: Sitting, Cuff Size: Normal)   Pulse 75   Temp 98 F (36.7 C) (Oral)   Ht 5\' 5"  (1.651 m)   Wt 195 lb (88.5 kg)   SpO2 98%   BMI 32.45 kg/m  Physical Exam Gen: NAD, alert, cooperative with exam, well-appearing ENT: normal lips, normal nasal mucosa,  Eye: normal EOM, normal conjunctiva  and lids CV:  no edema, +2 pedal pulses, regular in rhythm, S1-S2   Resp: no accessory muscle use, non-labored, clear to auscultation bilaterally, no wheezes or crackles GI: no masses or tenderness, no hernia  Skin: no rashes, no areas of induration  Neuro: normal tone, normal sensation to touch Psych:  normal insight, alert and oriented MSK: Normal gait, normal strength      ASSESSMENT & PLAN:   Diabetes His blood sugars continue to range in the 300s without any changes to his regimen. He reports no illness and no new foods or beverages. He continues to work out. He feels like his normal self other than having urinate frequently. - Change  metformin ER 500 to metformin 1000 twice a day. - Advised to follow-up sooner than later if his blood sugars continue to range in the 300s. - A1c today. A1c is significantly changed from previous. Advise for them to follow-up in one week. Will need to have to add another therapy if his blood sugars remain elevated.

## 2016-11-11 NOTE — Assessment & Plan Note (Addendum)
His blood sugars continue to range in the 300s without any changes to his regimen. He reports no illness and no new foods or beverages. He continues to work out. He feels like his normal self other than having urinate frequently. - Change metformin ER 500 to metformin 1000 twice a day. - Advised to follow-up sooner than later if his blood sugars continue to range in the 300s. - A1c today. A1c is significantly changed from previous. Advise for them to follow-up in one week. Will need to have to add another therapy if his blood sugars remain elevated.

## 2016-11-11 NOTE — Patient Instructions (Addendum)
Thank you for coming in,   Please stop the current prescription for metformin. I have started you on metformin twice a day. Please let me know if you have any abdominal discomfort associated with this. Please follow-up with me her regular doctor to check on your blood sugars they are not improving in the next few weeks.   Please feel free to call with any questions or concerns at any time, at [phone]. --Dr. Raeford Razor

## 2016-11-13 DIAGNOSIS — C61 Malignant neoplasm of prostate: Secondary | ICD-10-CM | POA: Diagnosis not present

## 2016-11-17 DIAGNOSIS — M79675 Pain in left toe(s): Secondary | ICD-10-CM | POA: Diagnosis not present

## 2016-11-17 DIAGNOSIS — E119 Type 2 diabetes mellitus without complications: Secondary | ICD-10-CM | POA: Diagnosis not present

## 2016-11-17 DIAGNOSIS — B351 Tinea unguium: Secondary | ICD-10-CM | POA: Diagnosis not present

## 2016-11-17 DIAGNOSIS — M79674 Pain in right toe(s): Secondary | ICD-10-CM | POA: Diagnosis not present

## 2016-11-18 ENCOUNTER — Ambulatory Visit (INDEPENDENT_AMBULATORY_CARE_PROVIDER_SITE_OTHER): Payer: Medicare Other | Admitting: Internal Medicine

## 2016-11-18 ENCOUNTER — Encounter: Payer: Self-pay | Admitting: Internal Medicine

## 2016-11-18 VITALS — BP 124/76 | HR 70 | Temp 98.0°F | Ht 65.0 in | Wt 194.0 lb

## 2016-11-18 DIAGNOSIS — Z23 Encounter for immunization: Secondary | ICD-10-CM

## 2016-11-18 DIAGNOSIS — E785 Hyperlipidemia, unspecified: Secondary | ICD-10-CM

## 2016-11-18 DIAGNOSIS — E119 Type 2 diabetes mellitus without complications: Secondary | ICD-10-CM | POA: Diagnosis not present

## 2016-11-18 DIAGNOSIS — J449 Chronic obstructive pulmonary disease, unspecified: Secondary | ICD-10-CM | POA: Diagnosis not present

## 2016-11-18 DIAGNOSIS — I10 Essential (primary) hypertension: Secondary | ICD-10-CM

## 2016-11-18 MED ORDER — GLIPIZIDE ER 10 MG PO TB24: 10.0000 mg | ORAL_TABLET | Freq: Every day | ORAL | 3 refills | Status: DC

## 2016-11-18 NOTE — Progress Notes (Signed)
Subjective:    Patient ID: Allen Dakin., male    DOB: 1940-12-17, 76 y.o.   MRN: [id number]  HPI  Here to f/u; overall doing ok,  Pt denies chest pain, increasing sob or doe, wheezing, orthopnea, PND, increased LE swelling, palpitations, dizziness or syncope.  Pt denies new neurological symptoms such as new headache, or facial or extremity weakness or numbness.  Pt denies polydipsia, polyuria, or low sugar episode.  Pt states overall good compliance with meds, mostly trying to follow appropriate diet, with wt overall stable,  but little exercise however. Plans to try to do more soon.  CBG's have been persistently in the low 200's or higherd Wt Readings from Last 3 Encounters:  11/18/16 194 lb (88 kg)  11/11/16 195 lb (88.5 kg)  09/30/16 197 lb (89.4 kg)  CBG in the AM usually 130- 150,  But cbg this am 368.  Not really checking sugars in the PM, but has had severwal > 300 without diet med or activity change. Tolerating the metormin ok. Just had f/u PSA 3 days ago at [facility] - recalls he was told it was stable.   Past Medical History:  Diagnosis Date  . ALLERGIC RHINITIS 10/04/2008  . ASTHMA 10/04/2008  . BACK PAIN 04/18/2008  . BENIGN PROSTATIC HYPERTROPHY 03/09/2007  . COLONIC POLYPS, HX OF 03/09/2007  . COPD 10/20/2007  . DIABETES MELLITUS, TYPE II 03/09/2007  . FATIGUE 10/05/2007  . GERD (gastroesophageal reflux disease) 03/01/2012  . GOUT 10/19/2006  . HYPERLIPIDEMIA 10/19/2006  . HYPERTENSION 10/19/2006  . OSA (obstructive sleep apnea) 07/19/2015  . Prostate cancer ([place]) 01/19/2012  . SWELLING MASS OR LUMP IN HEAD AND NECK 01/09/2010   Past Surgical History:  Procedure Laterality Date  . APPENDECTOMY      reports that he has quit smoking. He has never used smokeless tobacco. He reports that he does not drink alcohol. His drug history is not on file. family history includes Cancer in his other; Hypertension in his mother. No Known Allergies Current Outpatient  Prescriptions on File Prior to Visit  Medication Sig Dispense Refill  . albuterol (PROVENTIL HFA;VENTOLIN HFA) 108 (90 BASE) MCG/ACT inhaler Inhale 2 puffs into the lungs 4 (four) times daily. 3 Inhaler 3  . allopurinol (ZYLOPRIM) 300 MG tablet TAKE 1 TABLET DAILY 90 tablet 2  . aspirin 81 MG tablet Take 81 mg by mouth daily.      Marland Kitchen atorvastatin (LIPITOR) 20 MG tablet Take 1 tablet (20 mg total) by mouth daily. 90 tablet 2  . Blood Glucose Monitoring Suppl (FREESTYLE FREEDOM LITE) W/DEVICE KIT Use asd, 1 device, 250.02 1 each 0  . budesonide-formoterol (SYMBICORT) 80-4.5 MCG/ACT inhaler Inhale 2 puffs into the lungs 2 (two) times daily.    . cetirizine (ZYRTEC) 10 MG tablet Take 1 tablet (10 mg total) by mouth daily. 30 tablet 11  . Fluticasone-Salmeterol (ADVAIR DISKUS) 250-50 MCG/DOSE AEPB Inhale 1 puff into the lungs 2 (two) times daily. 3 each 3  . glucose blood (FREESTYLE LITE) test strip USE TO CHECK BLOOD SUGAR ONCE DAILY AS DIRECTED 300 each 2  . Lancets MISC Use asd 1 per day 250.02 100 each 11  . losartan-hydrochlorothiazide (HYZAAR) 50-12.5 MG tablet Take 1 tablet by mouth daily. 90 tablet 3  . metFORMIN (GLUCOPHAGE) 1000 MG tablet Take 1 tablet (1,000 mg total) by mouth 2 (two) times daily with a meal. 60 tablet 1  . montelukast (SINGULAIR) 10 MG tablet TAKE 1 TABLET (10MG TOTAL )  BY MOUTH AT BEDTIME 90 tablet 2  . NEXIUM 40 MG capsule Take 1 capsule (40 mg total) by mouth daily. 90 capsule 3  . Omega-3 Fatty Acids (FISH OIL) 500 MG CAPS Take by mouth daily.      Marland Kitchen omeprazole (PRILOSEC) 40 MG capsule Take 1 capsule (40 mg total) by mouth daily. 90 capsule 3  . triamcinolone (NASACORT AQ) 55 MCG/ACT AERO nasal inhaler Place 2 sprays into the nose daily. 1 Inhaler 12  . vardenafil (LEVITRA) 20 MG tablet 1 by mouth every other day as needed     . VIAGRA 100 MG tablet TAKE ONE-HALF (1/2) TO ONE TABLET (50 MG TO 100 MG) DAILY AS NEEDED FOR ERECTILE DYSFUNCTION 18 tablet 0   No current  facility-administered medications on file prior to visit.    Review of Systems  Constitutional: Negative for other unusual diaphoresis or sweats HENT: Negative for ear discharge or swelling Eyes: Negative for other worsening visual disturbances Respiratory: Negative for stridor or other swelling  Gastrointestinal: Negative for worsening distension or other blood Genitourinary: Negative for retention or other urinary change Musculoskeletal: Negative for other MSK pain or swelling Skin: Negative for color change or other new lesions Neurological: Negative for worsening tremors and other numbness  Psychiatric/Behavioral: Negative for worsening agitation or other fatigue All other system neg per pt    Objective:   Physical Exam BP 124/76   Pulse 70   Temp 98 F (36.7 C) (Oral)   Ht '5\' 5"'  (1.651 m)   Wt 194 lb (88 kg)   SpO2 98%   BMI 32.28 kg/m  VS noted,  Constitutional: Pt appears in NAD HENT: Head: NCAT.  Right Ear: External ear normal.  Left Ear: External ear normal.  Eyes: . Pupils are equal, round, and reactive to light. Conjunctivae and EOM are normal Nose: without d/c or deformity Neck: Neck supple. Gross normal ROM Cardiovascular: Normal rate and regular rhythm.   Pulmonary/Chest: Effort normal and breath sounds without rales or wheezing.  Neurological: Pt is alert. At baseline orientation, motor grossly intact Skin: Skin is warm. No rashes, other new lesions, no LE edema Psychiatric: Pt behavior is normal without agitation  No other exam findings Lab Results  Component Value Date   HGBA1C 10.3 11/11/2016      Assessment & Plan:

## 2016-11-18 NOTE — Patient Instructions (Signed)
Please take all new medication as prescribed - the glipizide ER 10 mg per day  You will be contacted regarding the referral for: Endocrinology  Please continue all other medications as before, and refills have been done if requested.  Please have the pharmacy call with any other refills you may need.  Please continue your efforts at being more active, low cholesterol diabetic diet, and weight control.  Please keep your appointments with your specialists as you may have planned

## 2016-11-21 NOTE — Assessment & Plan Note (Signed)
stable overall by history and exam, recent data reviewed with pt, and pt to continue medical treatment as before,  to f/u any worsening symptoms or concerns BP Readings from Last 3 Encounters:  11/18/16 124/76  11/11/16 118/76  09/30/16 122/88

## 2016-11-21 NOTE — Assessment & Plan Note (Signed)
stable overall by history and exam, recent data reviewed with pt, and pt to continue medical treatment as before,  to f/u any worsening symptoms or concerns  

## 2016-11-21 NOTE — Assessment & Plan Note (Signed)
stable overall by history and exam, recent data reviewed with pt, and pt to continue medical treatment as before,  to f/u any worsening symptoms or concerns Lab Results  Component Value Date   LDLCALC 73 07/21/2016

## 2016-11-21 NOTE — Assessment & Plan Note (Addendum)
Mod uncontrolled, to add glipizide ER 10 qd, cont all other tx, to cont to work on diet, activity and wt control; also for endo referral due to unusual nature of sudden moderate to severe out of control sugars when o/w stable

## 2016-11-26 DIAGNOSIS — R0609 Other forms of dyspnea: Secondary | ICD-10-CM | POA: Diagnosis not present

## 2016-11-26 DIAGNOSIS — G4733 Obstructive sleep apnea (adult) (pediatric): Secondary | ICD-10-CM | POA: Diagnosis not present

## 2016-11-26 DIAGNOSIS — J439 Emphysema, unspecified: Secondary | ICD-10-CM | POA: Diagnosis not present

## 2016-12-21 ENCOUNTER — Other Ambulatory Visit: Payer: Self-pay | Admitting: Internal Medicine

## 2017-01-21 ENCOUNTER — Ambulatory Visit: Payer: Medicare Other | Admitting: Internal Medicine

## 2017-02-22 ENCOUNTER — Other Ambulatory Visit: Payer: Self-pay

## 2017-02-22 DIAGNOSIS — E119 Type 2 diabetes mellitus without complications: Secondary | ICD-10-CM

## 2017-02-22 MED ORDER — METFORMIN HCL 1000 MG PO TABS: 1000.0000 mg | ORAL_TABLET | Freq: Two times a day (BID) | ORAL | 2 refills | Status: DC

## 2017-03-06 ENCOUNTER — Other Ambulatory Visit: Payer: Self-pay | Admitting: Internal Medicine

## 2017-03-19 DIAGNOSIS — R0609 Other forms of dyspnea: Secondary | ICD-10-CM | POA: Diagnosis not present

## 2017-03-19 DIAGNOSIS — G4733 Obstructive sleep apnea (adult) (pediatric): Secondary | ICD-10-CM | POA: Diagnosis not present

## 2017-03-19 DIAGNOSIS — R05 Cough: Secondary | ICD-10-CM | POA: Diagnosis not present

## 2017-03-19 DIAGNOSIS — J439 Emphysema, unspecified: Secondary | ICD-10-CM | POA: Diagnosis not present

## 2017-03-22 ENCOUNTER — Other Ambulatory Visit: Payer: Self-pay | Admitting: Internal Medicine

## 2017-05-17 ENCOUNTER — Other Ambulatory Visit: Payer: Self-pay | Admitting: Internal Medicine

## 2017-05-17 DIAGNOSIS — E119 Type 2 diabetes mellitus without complications: Secondary | ICD-10-CM

## 2017-05-18 ENCOUNTER — Ambulatory Visit (INDEPENDENT_AMBULATORY_CARE_PROVIDER_SITE_OTHER): Payer: Medicare Other | Admitting: Internal Medicine

## 2017-05-18 ENCOUNTER — Other Ambulatory Visit (INDEPENDENT_AMBULATORY_CARE_PROVIDER_SITE_OTHER): Payer: Medicare Other

## 2017-05-18 VITALS — BP 128/82 | HR 95 | Temp 98.1°F | Ht 65.0 in | Wt 197.0 lb

## 2017-05-18 DIAGNOSIS — J449 Chronic obstructive pulmonary disease, unspecified: Secondary | ICD-10-CM | POA: Diagnosis not present

## 2017-05-18 DIAGNOSIS — E119 Type 2 diabetes mellitus without complications: Secondary | ICD-10-CM | POA: Diagnosis not present

## 2017-05-18 DIAGNOSIS — R5383 Other fatigue: Secondary | ICD-10-CM

## 2017-05-18 DIAGNOSIS — C61 Malignant neoplasm of prostate: Secondary | ICD-10-CM

## 2017-05-18 DIAGNOSIS — E785 Hyperlipidemia, unspecified: Secondary | ICD-10-CM | POA: Diagnosis not present

## 2017-05-18 DIAGNOSIS — I10 Essential (primary) hypertension: Secondary | ICD-10-CM

## 2017-05-18 LAB — LIPID PANEL
Cholesterol: 139 mg/dL (ref 0–200)
HDL: 51.7 mg/dL (ref >39.00)
LDL Cholesterol: 79 mg/dL (ref 0–99)
NonHDL: 87.51
Total CHOL/HDL Ratio: 3
Triglycerides: 41 mg/dL (ref 0.0–149.0)
VLDL: 8.2 mg/dL (ref 0.0–40.0)

## 2017-05-18 LAB — CBC WITH DIFFERENTIAL/PLATELET
Basophils Absolute: 0 10*3/uL (ref 0.0–0.1)
Basophils Relative: 0.4 % (ref 0.0–3.0)
Eosinophils Absolute: 0.1 10*3/uL (ref 0.0–0.7)
Eosinophils Relative: 1.6 % (ref 0.0–5.0)
HCT: 42.6 % (ref 39.0–52.0)
Hemoglobin: 13.7 g/dL (ref 13.0–17.0)
Lymphocytes Relative: 31.7 % (ref 12.0–46.0)
Lymphs Abs: 1.4 10*3/uL (ref 0.7–4.0)
MCHC: 32.3 g/dL (ref 30.0–36.0)
MCV: 88.9 fl (ref 78.0–100.0)
Monocytes Absolute: 0.4 10*3/uL (ref 0.1–1.0)
Monocytes Relative: 8.3 % (ref 3.0–12.0)
Neutro Abs: 2.6 10*3/uL (ref 1.4–7.7)
Neutrophils Relative %: 58 % (ref 43.0–77.0)
Platelets: 215 10*3/uL (ref 150.0–400.0)
RBC: 4.79 Mil/uL (ref 4.22–5.81)
RDW: 16.2 % — ABNORMAL HIGH (ref 11.5–15.5)
WBC: 4.6 10*3/uL (ref 4.0–10.5)

## 2017-05-18 LAB — MICROALBUMIN / CREATININE URINE RATIO
Creatinine,U: 94.7 mg/dL
Microalb Creat Ratio: 1.9 mg/g (ref 0.0–30.0)
Microalb, Ur: 1.8 mg/dL (ref 0.0–1.9)

## 2017-05-18 LAB — BASIC METABOLIC PANEL
BUN: 30 mg/dL — ABNORMAL HIGH (ref 6–23)
CO2: 33 mEq/L — ABNORMAL HIGH (ref 19–32)
Calcium: 10.3 mg/dL (ref 8.4–10.5)
Chloride: 102 mEq/L (ref 96–112)
Creatinine, Ser: 1.13 mg/dL (ref 0.40–1.50)
GFR: 80.59 mL/min (ref >60.00)
Glucose, Bld: 147 mg/dL — ABNORMAL HIGH (ref 70–99)
Potassium: 4.9 mEq/L (ref 3.5–5.1)
Sodium: 141 mEq/L (ref 135–145)

## 2017-05-18 LAB — URINALYSIS, ROUTINE W REFLEX MICROSCOPIC
Bilirubin Urine: NEGATIVE
Hgb urine dipstick: NEGATIVE
Ketones, ur: NEGATIVE
Leukocytes, UA: NEGATIVE
Nitrite: NEGATIVE
RBC / HPF: NONE SEEN (ref 0–?)
Specific Gravity, Urine: 1.02 (ref 1.000–1.030)
Total Protein, Urine: NEGATIVE
Urine Glucose: NEGATIVE
Urobilinogen, UA: 0.2 (ref 0.0–1.0)
WBC, UA: NONE SEEN (ref 0–?)
pH: 5.5 (ref 5.0–8.0)

## 2017-05-18 LAB — HEPATIC FUNCTION PANEL
ALT: 17 U/L (ref 0–53)
AST: 15 U/L (ref 0–37)
Albumin: 4.1 g/dL (ref 3.5–5.2)
Alkaline Phosphatase: 40 U/L (ref 39–117)
Bilirubin, Direct: 0.1 mg/dL (ref 0.0–0.3)
Total Bilirubin: 0.4 mg/dL (ref 0.2–1.2)
Total Protein: 6.4 g/dL (ref 6.0–8.3)

## 2017-05-18 LAB — PSA: PSA: 5.19 ng/mL — ABNORMAL HIGH (ref 0.10–4.00)

## 2017-05-18 LAB — TSH: TSH: 1.57 u[IU]/mL (ref 0.35–4.50)

## 2017-05-18 LAB — HEMOGLOBIN A1C: Hgb A1c MFr Bld: 7.6 % — ABNORMAL HIGH (ref 4.6–6.5)

## 2017-05-18 MED ORDER — TAMSULOSIN HCL 0.4 MG PO CAPS: 0.4000 mg | ORAL_CAPSULE | Freq: Every day | ORAL | 3 refills | Status: DC

## 2017-05-18 MED ORDER — FREESTYLE LIBRE 14 DAY READER DEVI: 1.0000 | Freq: Every day | 0 refills | Status: DC

## 2017-05-18 MED ORDER — FREESTYLE LIBRE 14 DAY SENSOR MISC: 1.0000 | 3 refills | Status: DC

## 2017-05-18 MED ORDER — ZOSTER VAC RECOMB ADJUVANTED 50 MCG/0.5ML IM SUSR: 0.5000 mL | Freq: Once | INTRAMUSCULAR | 1 refills | Status: AC

## 2017-05-18 MED ORDER — BUDESONIDE-FORMOTEROL FUMARATE 160-4.5 MCG/ACT IN AERO: 2.0000 | INHALATION_SPRAY | Freq: Two times a day (BID) | RESPIRATORY_TRACT | 3 refills | Status: DC

## 2017-05-18 NOTE — Progress Notes (Signed)
Subjective:    Patient ID: Allen Dakin., male    DOB: October 27, 1940, 77 y.o.   MRN: [id number]  HPI  Here to f/u; overall doing ok,  Pt denies chest pain, wheezing, orthopnea, PND, increased LE swelling, palpitations, dizziness or syncope.  Pt denies new neurological symptoms such as new headache, or facial or extremity weakness or numbness.  Pt denies polydipsia, polyuria, or low sugar episode.  Pt states overall good compliance with meds, mostly trying to follow appropriate diet, with wt overall stable.   Wt Readings from Last 3 Encounters:  05/18/17 197 lb (89.4 kg)  11/18/16 194 lb (88 kg)  11/11/16 195 lb (88.5 kg)  Also has some worsening sob and doe with good med compliance. Symbicort 80 helps with elipital at 45 min, but not quite as good recently, HR's get to 120 or over but no chest pain or dizziness.   Also has urinary freq and urgency ongoing for several months, now has to know where all the bathrooms are as he can have severe urgency and near wetting accidents even with shopping,  Getting more concerned about even leaving his home. Denies urinary symptoms such as dysuria, flank pain, hematuria or n/v, fever, chills. Also asks for [facility] device for sugar monitoring.  Does c/o ongoing fatigue and low energy, but denies signficant daytime hypersomnolence.   Past Medical History:  Diagnosis Date  . ALLERGIC RHINITIS 10/04/2008  . ASTHMA 10/04/2008  . BACK PAIN 04/18/2008  . BENIGN PROSTATIC HYPERTROPHY 03/09/2007  . COLONIC POLYPS, HX OF 03/09/2007  . COPD 10/20/2007  . DIABETES MELLITUS, TYPE II 03/09/2007  . FATIGUE 10/05/2007  . GERD (gastroesophageal reflux disease) 03/01/2012  . GOUT 10/19/2006  . HYPERLIPIDEMIA 10/19/2006  . HYPERTENSION 10/19/2006  . OSA (obstructive sleep apnea) 07/19/2015  . Pro[place] cancer ([place]) 01/19/2012  . SWELLING MASS OR LUMP IN HEAD AND NECK 01/09/2010   Past Surgical History:  Procedure Laterality Date  . APPENDECTOMY      reports that he has  quit smoking. he has never used smokeless tobacco. He reports that he does not drink alcohol. His drug history is not on file. family history includes Cancer in his other; Hypertension in his mother. No Known Allergies Current Outpatient Medications on File Prior to Visit  Medication Sig Dispense Refill  . albuterol (PROVENTIL HFA;VENTOLIN HFA) 108 (90 BASE) MCG/ACT inhaler Inhale 2 puffs into the lungs 4 (four) times daily. 3 Inhaler 3  . allopurinol (ZYLOPRIM) 300 MG tablet TAKE 1 TABLET DAILY 90 tablet 2  . aspirin 81 MG tablet Take 81 mg by mouth daily.      Marland Kitchen atorvastatin (LIPITOR) 20 MG tablet Take 1 tablet (20 mg total) by mouth daily. 90 tablet 2  . cetirizine (ZYRTEC) 10 MG tablet Take 1 tablet (10 mg total) by mouth daily. 30 tablet 11  . glipiZIDE (GLUCOTROL XL) 10 MG 24 hr tablet Take 1 tablet (10 mg total) by mouth daily with breakfast. 90 tablet 3  . Lancets MISC Use asd 1 per day 250.02 100 each 11  . losartan-hydrochlorothiazide (HYZAAR) 50-12.5 MG tablet Take 1 tablet by mouth daily. 90 tablet 3  . metFORMIN (GLUCOPHAGE) 1000 MG tablet TAKE 1 TABLET(1000 MG) BY MOUTH TWICE DAILY WITH A MEAL 60 tablet 2  . montelukast (SINGULAIR) 10 MG tablet TAKE 1 TABLET (10MG  TOTAL ) BY MOUTH AT BEDTIME 90 tablet 2  . NEXIUM 40 MG capsule TAKE 1 CAPSULE DAILY 90 capsule 1  . Omega-3 Fatty  Acids (FISH OIL) 500 MG CAPS Take by mouth daily.      Marland Kitchen omeprazole (PRILOSEC) 40 MG capsule Take 1 capsule (40 mg total) by mouth daily. 90 capsule 3  . triamcinolone (NASACORT AQ) 55 MCG/ACT AERO nasal inhaler Place 2 sprays into the nose daily. 1 Inhaler 12  . vardenafil (LEVITRA) 20 MG tablet 1 by mouth every other day as needed     . VIAGRA 100 MG tablet TAKE ONE-HALF (1/2) TO ONE TABLET (50 MG TO 100 MG) DAILY AS NEEDED FOR ERECTILE DYSFUNCTION 18 tablet 0   No current facility-administered medications on file prior to visit.    Review of Systems  Constitutional: Negative for other unusual  diaphoresis or sweats HENT: Negative for ear discharge or swelling Eyes: Negative for other worsening visual disturbances Respiratory: Negative for stridor or other swelling  Gastrointestinal: Negative for worsening distension or other blood Genitourinary: Negative for retention or other urinary change Musculoskeletal: Negative for other MSK pain or swelling Skin: Negative for color change or other new lesions Neurological: Negative for worsening tremors and other numbness  Psychiatric/Behavioral: Negative for worsening agitation or other fatigue All other system neg per pt    Objective:   Physical Exam BP 128/82   Pulse 95   Temp 98.1 F (36.7 C) (Oral)   Ht 5\' 5"  (1.651 m)   Wt 197 lb (89.4 kg)   SpO2 97%   BMI 32.78 kg/m  VS noted,  Constitutional: Pt appears in NAD HENT: Head: NCAT.  Right Ear: External ear normal.  Left Ear: External ear normal.  Eyes: . Pupils are equal, round, and reactive to light. Conjunctivae and EOM are normal Nose: without d/c or deformity Neck: Neck supple. Gross normal ROM Cardiovascular: Normal rate and regular rhythm.   Pulmonary/Chest: Effort normal and breath sounds decreased without rales or wheezing.  Abd:  Soft, NT, ND, + BS, no organomegaly, no flank tender Neurological: Pt is alert. At baseline orientation, motor grossly intact Skin: Skin is warm. No rashes, other new lesions, no LE edema Psychiatric: Pt behavior is normal without agitation  No other exam findings    Assessment & Plan:

## 2017-05-18 NOTE — Patient Instructions (Addendum)
The [facility] was sent to the pharmacy, and the Shingles shot as well  OK to increase the Symbicort to the 160  Please call if not better in 1-2 weeks to think about change to Trelegy inhaler  Please take all new medication as prescribed - the Flomax for prostate  Please call if not better in the next wk, to consider a medication for Overactive Bladder  Please continue all other medications as before, and refills have been done if requested.  Please have the pharmacy call with any other refills you may need.  Please continue your efforts at being more active, low cholesterol diet, and weight control.  You are otherwise up to date with prevention measures today.  Please keep your appointments with your specialists as you may have planned  Please go to the LAB in the Basement (turn left off the elevator) for the tests to be done today  You will be contacted by phone if any changes need to be made immediately.  Otherwise, you will receive a letter about your results with an explanation, but please check with MyChart first.  Please remember to sign up for MyChart if you have not done so, as this will be important to you in the future with finding out test results, communicating by private email, and scheduling acute appointments online when needed.  Please return in 6 months, or sooner if needed

## 2017-05-20 NOTE — Assessment & Plan Note (Signed)
Etiology unclear, Exam otherwise benign, to check labs as documented, follow with expectant management  

## 2017-05-20 NOTE — Assessment & Plan Note (Signed)
Also for psa with labs as he is due

## 2017-05-20 NOTE — Assessment & Plan Note (Signed)
With mild symptoms persistent, ok for increased symbicort to 160,  to f/u any worsening symptoms or concerns

## 2017-05-20 NOTE — Assessment & Plan Note (Signed)
stable overall by history and exam, recent data reviewed with pt, and pt to continue medical treatment as before,  to f/u any worsening symptoms or concerns, for f/u a1c with labs 

## 2017-05-20 NOTE — Assessment & Plan Note (Signed)
stable overall by history and exam, recent data reviewed with pt, and pt to continue medical treatment as before,  to f/u any worsening symptoms or concerns BP Readings from Last 3 Encounters:  05/18/17 128/82  11/18/16 124/76  11/11/16 118/76

## 2017-05-20 NOTE — Assessment & Plan Note (Signed)
Lab Results  Component Value Date   LDLCALC 79 05/18/2017  stable overall by history and exam, recent data reviewed with pt, and pt to continue medical treatment as before,  to f/u any worsening symptoms or concerns

## 2017-05-25 DIAGNOSIS — C61 Malignant neoplasm of prostate: Secondary | ICD-10-CM | POA: Diagnosis not present

## 2017-06-01 DIAGNOSIS — C61 Malignant neoplasm of prostate: Secondary | ICD-10-CM | POA: Diagnosis not present

## 2017-06-15 DIAGNOSIS — M79674 Pain in right toe(s): Secondary | ICD-10-CM | POA: Diagnosis not present

## 2017-06-15 DIAGNOSIS — B351 Tinea unguium: Secondary | ICD-10-CM | POA: Diagnosis not present

## 2017-06-15 DIAGNOSIS — M79675 Pain in left toe(s): Secondary | ICD-10-CM | POA: Diagnosis not present

## 2017-07-16 DIAGNOSIS — G4733 Obstructive sleep apnea (adult) (pediatric): Secondary | ICD-10-CM | POA: Diagnosis not present

## 2017-07-16 DIAGNOSIS — J449 Chronic obstructive pulmonary disease, unspecified: Secondary | ICD-10-CM | POA: Diagnosis not present

## 2017-07-16 DIAGNOSIS — J310 Chronic rhinitis: Secondary | ICD-10-CM | POA: Diagnosis not present

## 2017-07-16 DIAGNOSIS — J439 Emphysema, unspecified: Secondary | ICD-10-CM | POA: Diagnosis not present

## 2017-07-22 ENCOUNTER — Ambulatory Visit (INDEPENDENT_AMBULATORY_CARE_PROVIDER_SITE_OTHER): Payer: Medicare Other | Admitting: *Deleted

## 2017-07-22 VITALS — BP 146/72 | HR 94 | Resp 18 | Ht 65.0 in | Wt 198.0 lb

## 2017-07-22 DIAGNOSIS — Z0000 Encounter for general adult medical examination without abnormal findings: Secondary | ICD-10-CM

## 2017-07-22 NOTE — Patient Instructions (Addendum)
Please ask [facility] to send [facility] exam report to Dr. Jenny Reichmann fax# [phone] during your upcoming visit.   Continue to eat heart healthy diet (full of fruits, vegetables, whole grains, lean protein, water--limit salt, fat, and sugar intake) and increase physical activity as tolerated.  Continue doing brain stimulating activities (puzzles, reading, adult coloring books, staying active) to keep memory sharp.   Allen Hunter , Thank you for taking time to come for your Medicare Wellness Visit. I appreciate your ongoing commitment to your health goals. Please review the following plan we discussed and let me know if I can assist you in the future.   These are the goals we discussed: Goals    . Patient Stated     I will start to wear my C-PAP, continue to exercise and monitor my diet for fat, sugar, carbohydrates.       This is a list of the screening recommended for you and due dates:  Health Maintenance  Topic Date Due  . Eye exam for diabetics  05/21/2017  . Flu Shot  10/21/2017  . Tetanus Vaccine  04/02/2018  . Complete foot exam   05/18/2018  . Pneumonia vaccines  Completed    It is important to avoid accidents which may result in broken bones.  Here are a few ideas on how to make your home safer so you will be less likely to trip or fall.  1. Use nonskid mats or non slip strips in your shower or tub, on your bathroom floor and around sinks.  If you know that you have spilled water, wipe it up! 2. In the bathroom, it is important to have properly installed grab bars on the walls or on the edge of the tub.  Towel racks are NOT strong enough for you to hold onto or to pull on for support. 3. Stairs and hallways should have enough light.  Add lamps or night lights if you need ore light. 4. It is good to have handrails on both sides of the stairs if possible.  Always fix broken handrails right away. 5. It is important to see the edges of steps.  Paint the edges of outdoor steps white so you can see  them better.  Put colored tape on the edge of inside steps. 6. Throw-rugs are dangerous because they can slide.  Removing the rugs is the best idea, but if they must stay, add adhesive carpet tape to prevent slipping. 7. Do not keep things on stairs or in the halls.  Remove small furniture that blocks the halls as it may cause you to trip.  Keep telephone and electrical cords out of the way where you walk. 8. Always were sturdy, rubber-soled shoes for good support.  Never wear just socks, especially on the stairs.  Socks may cause you to slip or fall.  Do not wear full-length housecoats as you can easily trip on the bottom.  9. Place the things you use the most on the shelves that are the easiest to reach.  If you use a stepstool, make sure it is in good condition.  If you feel unsteady, DO NOT climb, ask for help. 10. If a health professional advises you to use a cane or walker, do not be ashamed.  These items can keep you from falling and breaking your bones. Health Maintenance, Male A healthy lifestyle and preventive care is important for your health and wellness. Ask your health care provider about what schedule of regular examinations is right  for you. What should I know about weight and diet? Eat a Healthy Diet  Eat plenty of vegetables, fruits, whole grains, low-fat dairy products, and lean protein.  Do not eat a lot of foods high in solid fats, added sugars, or salt.  Maintain a Healthy Weight Regular exercise can help you achieve or maintain a healthy weight. You should:  Do at least 150 minutes of exercise each week. The exercise should increase your heart rate and make you sweat (moderate-intensity exercise).  Do strength-training exercises at least twice a week.  Watch Your Levels of Cholesterol and Blood Lipids  Have your blood tested for lipids and cholesterol every 5 years starting at 79 years of age. If you are at high risk for heart disease, you should start having your blood  tested when you are 79 years old. You may need to have your cholesterol levels checked more often if: ? Your lipid or cholesterol levels are high. ? You are older than 79 years of age. ? You are at high risk for heart disease.  What should I know about cancer screening? Many types of cancers can be detected early and may often be prevented. Lung Cancer  You should be screened every year for lung cancer if: ? You are a current smoker who has smoked for at least 30 years. ? You are a former smoker who has quit within the past 15 years.  Talk to your health care provider about your screening options, when you should start screening, and how often you should be screened.  Colorectal Cancer  Routine colorectal cancer screening usually begins at 79 years of age and should be repeated every 5-10 years until you are 79 years old. You may need to be screened more often if early forms of precancerous polyps or small growths are found. Your health care provider may recommend screening at an earlier age if you have risk factors for colon cancer.  Your health care provider may recommend using home test kits to check for hidden blood in the stool.  A small camera at the end of a tube can be used to examine your colon (sigmoidoscopy or colonoscopy). This checks for the earliest forms of colorectal cancer.  Prostate and Testicular Cancer  Depending on your age and overall health, your health care provider may do certain tests to screen for prostate and testicular cancer.  Talk to your health care provider about any symptoms or concerns you have about testicular or prostate cancer.  Skin Cancer  Check your skin from head to toe regularly.  Tell your health care provider about any new moles or changes in moles, especially if: ? There is a change in a mole's size, shape, or color. ? You have a mole that is larger than a pencil eraser.  Always use sunscreen. Apply sunscreen liberally and repeat  throughout the day.  Protect yourself by wearing long sleeves, pants, a wide-brimmed hat, and sunglasses when outside.  What should I know about heart disease, diabetes, and high blood pressure?  If you are 70-65 years of age, have your blood pressure checked every 3-5 years. If you are 33 years of age or older, have your blood pressure checked every year. You should have your blood pressure measured twice-once when you are at a hospital or clinic, and once when you are not at a hospital or clinic. Record the average of the two measurements. To check your blood pressure when you are not at a hospital or  clinic, you can use: ? An automated blood pressure machine at a pharmacy. ? A home blood pressure monitor.  Talk to your health care provider about your target blood pressure.  If you are between 8-12 years old, ask your health care provider if you should take aspirin to prevent heart disease.  Have regular diabetes screenings by checking your fasting blood sugar level. ? If you are at a normal weight and have a low risk for diabetes, have this test once every three years after the age of 46. ? If you are overweight and have a high risk for diabetes, consider being tested at a younger age or more often.  A one-time screening for abdominal aortic aneurysm (AAA) by ultrasound is recommended for men aged 44-75 years who are current or former smokers. What should I know about preventing infection? Hepatitis B If you have a higher risk for hepatitis B, you should be screened for this virus. Talk with your health care provider to find out if you are at risk for hepatitis B infection. Hepatitis C Blood testing is recommended for:  Everyone born from [birth year] through 1967.  Anyone with known risk factors for hepatitis C.  Sexually Transmitted Diseases (STDs)  You should be screened each year for STDs including gonorrhea and chlamydia if: ? You are sexually active and are younger than 79 years of  age. ? You are older than 79 years of age and your health care provider tells you that you are at risk for this type of infection. ? Your sexual activity has changed since you were last screened and you are at an increased risk for chlamydia or gonorrhea. Ask your health care provider if you are at risk.  Talk with your health care provider about whether you are at high risk of being infected with HIV. Your health care provider may recommend a prescription medicine to help prevent HIV infection.  What else can I do?  Schedule regular health, dental, and eye exams.  Stay current with your vaccines (immunizations).  Do not use any tobacco products, such as cigarettes, chewing tobacco, and e-cigarettes. If you need help quitting, ask your health care provider.  Limit alcohol intake to no more than 2 drinks per day. One drink equals 12 ounces of beer, 5 ounces of Amaranta Mehl, or 1 ounces of hard liquor.  Do not use street drugs.  Do not share needles.  Ask your health care provider for help if you need support or information about quitting drugs.  Tell your health care provider if you often feel depressed.  Tell your health care provider if you have ever been abused or do not feel safe at home. This information is not intended to replace advice given to you by your health care provider. Make sure you discuss any questions you have with your health care provider. Document Released: 09/05/2007 Document Revised: 11/06/2015 Document Reviewed: 12/11/2014 Elsevier Interactive Patient Education  [employer].

## 2017-07-22 NOTE — Progress Notes (Addendum)
Subjective:   Allen Hunter. is a 77 y.o. male who presents for Medicare Annual/Subsequent preventive examination.  Review of Systems:  No ROS.  Medicare Wellness Visit. Additional risk factors are reflected in the social history.  Cardiac Risk Factors include: advanced age (>64men, >91 women);dyslipidemia;hypertension;male gender;diabetes mellitus Sleep patterns: feels rested on waking, gets up 2 times nightly to void and sleeps 7 hours nightly.    Home Safety/Smoke Alarms: Feels safe in home. Smoke alarms in place.  Living environment; residence and Firearm Safety: 2-story house, no firearms. Lives alone*, no needs for DME, good support system Seat Belt Safety/Bike Helmet: Wears seat belt.   PSA-  Lab Results  Component Value Date   PSA 5.19 (H) 05/18/2017   PSA 4.38 (H) 07/21/2016   PSA 4.98 (H) 01/21/2016       Objective:    Vitals: BP (!) 146/72   Pulse 94   Resp 18   Ht 5\' 5"  (1.651 m)   Wt 198 lb (89.8 kg)   SpO2 98%   BMI 32.95 kg/m   Body mass index is 32.95 kg/m.  Advanced Directives 07/22/2017 07/17/2016  Does Patient Have a Medical Advance Directive? No No  Would patient like information on creating a medical advance directive? Yes (ED - Information included in AVS) Yes (ED - Information included in AVS)    Tobacco Social History   Tobacco Use  Smoking Status Former Smoker  Smokeless Tobacco Never Used     Counseling given: Not Answered     Past Medical History:  Diagnosis Date  . ALLERGIC RHINITIS 10/04/2008  . ASTHMA 10/04/2008  . BACK PAIN 04/18/2008  . BENIGN PROSTATIC HYPERTROPHY 03/09/2007  . COLONIC POLYPS, HX OF 03/09/2007  . COPD 10/20/2007  . DIABETES MELLITUS, TYPE II 03/09/2007  . FATIGUE 10/05/2007  . GERD (gastroesophageal reflux disease) 03/01/2012  . GOUT 10/19/2006  . HYPERLIPIDEMIA 10/19/2006  . HYPERTENSION 10/19/2006  . OSA (obstructive sleep apnea) 07/19/2015  . Prostate cancer ([place]) 01/19/2012  . SWELLING MASS OR LUMP IN  HEAD AND NECK 01/09/2010   Past Surgical History:  Procedure Laterality Date  . APPENDECTOMY     Family History  Problem Relation Age of Onset  . Hypertension Mother   . Cancer Other    Social History   Socioeconomic History  . Marital status: Single    Spouse name: Not on file  . Number of children: Not on file  . Years of education: Not on file  . Highest education level: Not on file  Occupational History  . Occupation: retired [employer] aug 2009  Social Needs  . Financial resource strain: Not hard at all  . Food insecurity:    Worry: Never true    Inability: Never true  . Transportation needs:    Medical: No    Non-medical: No  Tobacco Use  . Smoking status: Former Research scientist (life sciences)  . Smokeless tobacco: Never Used  Substance and Sexual Activity  . Alcohol use: No  . Drug use: Never  . Sexual activity: Not Currently  Lifestyle  . Physical activity:    Days per week: 4 days    Minutes per session: 60 min  . Stress: Not at all  Relationships  . Social connections:    Talks on phone: Once a week    Gets together: Once a week    Attends religious service: 1 to 4 times per year    Active member of club or organization: No  Attends meetings of clubs or organizations: Never    Relationship status: Not on file  Other Topics Concern  . Not on file  Social History Narrative  . Not on file    Outpatient Encounter Medications as of 07/22/2017  Medication Sig  . albuterol (PROVENTIL HFA;VENTOLIN HFA) 108 (90 BASE) MCG/ACT inhaler Inhale 2 puffs into the lungs 4 (four) times daily.  Marland Kitchen allopurinol (ZYLOPRIM) 300 MG tablet TAKE 1 TABLET DAILY  . aspirin 81 MG tablet Take 81 mg by mouth daily.    Marland Kitchen atorvastatin (LIPITOR) 20 MG tablet Take 1 tablet (20 mg total) by mouth daily.  . budesonide-formoterol (SYMBICORT) 160-4.5 MCG/ACT inhaler Inhale 2 puffs into the lungs 2 (two) times daily.  . Continuous Blood Gluc Receiver (FREESTYLE LIBRE 14 DAY READER) DEVI Apply 1 Device  topically daily.  . Continuous Blood Gluc Sensor (FREESTYLE LIBRE 14 DAY SENSOR) MISC Apply 1 Device topically every 14 (fourteen) days.  Marland Kitchen glipiZIDE (GLUCOTROL XL) 10 MG 24 hr tablet Take 1 tablet (10 mg total) by mouth daily with breakfast.  . Lancets MISC Use asd 1 per day 250.02  . losartan-hydrochlorothiazide (HYZAAR) 50-12.5 MG tablet Take 1 tablet by mouth daily.  . metFORMIN (GLUCOPHAGE) 1000 MG tablet TAKE 1 TABLET(1000 MG) BY MOUTH TWICE DAILY WITH A MEAL  . montelukast (SINGULAIR) 10 MG tablet TAKE 1 TABLET (10MG  TOTAL ) BY MOUTH AT BEDTIME  . NEXIUM 40 MG capsule TAKE 1 CAPSULE DAILY  . Omega-3 Fatty Acids (FISH OIL) 500 MG CAPS Take by mouth daily.    . tamsulosin (FLOMAX) 0.4 MG CAPS capsule Take 1 capsule (0.4 mg total) by mouth daily.  Marland Kitchen triamcinolone (NASACORT AQ) 55 MCG/ACT AERO nasal inhaler Place 2 sprays into the nose daily.  Marland Kitchen VIAGRA 100 MG tablet TAKE ONE-HALF (1/2) TO ONE TABLET (50 MG TO 100 MG) DAILY AS NEEDED FOR ERECTILE DYSFUNCTION  . cetirizine (ZYRTEC) 10 MG tablet Take 1 tablet (10 mg total) by mouth daily.  . [DISCONTINUED] omeprazole (PRILOSEC) 40 MG capsule Take 1 capsule (40 mg total) by mouth daily. (Patient not taking: Reported on 07/22/2017)  . [DISCONTINUED] vardenafil (LEVITRA) 20 MG tablet 1 by mouth every other day as needed    No facility-administered encounter medications on file as of 07/22/2017.     Activities of Daily Living In your present state of health, do you have any difficulty performing the following activities: 07/22/2017  Hearing? N  Vision? N  Difficulty concentrating or making decisions? N  Walking or climbing stairs? N  Dressing or bathing? N  Doing errands, shopping? N  Preparing Food and eating ? N  Using the Toilet? N  In the past six months, have you accidently leaked urine? N  Do you have problems with loss of bowel control? N  Managing your Medications? N  Managing your Finances? N  Housekeeping or managing your  Housekeeping? N  Some recent data might be hidden    Patient Care Team: Allen Borg, MD as PCP - General   Assessment:   This is a routine wellness examination for Allen Hunter. Physical assessment deferred to PCP.   Exercise Activities and Dietary recommendations Current Exercise Habits: Structured exercise class, Type of exercise: walking;treadmill;calisthenics, Time (Minutes): 60, Frequency (Times/Week): 4, Weekly Exercise (Minutes/Week): 240, Intensity: Moderate, Exercise limited by: None identified  Diet (meal preparation, eat out, water intake, caffeinated beverages, dairy products, fruits and vegetables): in general, a "healthy" diet  , well balanced, eats a variety of fruits and  vegetables daily, limits salt, fat/cholesterol, sugar,carbohydrates,caffeine, drinks 6-8 glasses of water daily.  Reviewed heart healthy and diabetic diet, encouraged patient to increase daily water intake. Discussed weight loss strategies, Diet education was provided via handout.  Goals    . Patient Stated     I will start to wear my C-PAP, continue to exercise and monitor my diet for fat, sugar, carbohydrates.       Fall Risk Fall Risk  07/22/2017 05/18/2017 07/17/2016 07/19/2015 07/19/2014  Falls in the past year? No No No No Yes  Number falls in past yr: - - - - 1  Injury with Fall? - - - - No    Depression Screen PHQ 2/9 Scores 07/22/2017 05/18/2017 07/17/2016 07/19/2015  PHQ - 2 Score 1 0 0 0  PHQ- 9 Score 1 - - -    Cognitive Function MMSE - Mini Mental State Exam 07/22/2017  Orientation to time 5  Orientation to Place 5  Registration 3  Attention/ Calculation 3  Recall 3  Language- name 2 objects 2  Language- repeat 1  Language- follow 3 step command 3  Language- read & follow direction 1  Write a sentence 1  Copy design 1  Total score 28        Immunization History  Administered Date(s) Administered  . H1N1 04/02/2008  . Influenza Whole 03/09/2007, 12/27/2007, 01/21/2009  .  Influenza, High Dose Seasonal PF 11/18/2016  . Influenza, Seasonal, Injecte, Preservative Fre 02/02/2013  . Influenza,inj,Quad PF,6+ Mos 01/18/2015  . Influenza-Unspecified 01/15/2016  . Pneumococcal Conjugate-13 04/04/2013  . Pneumococcal Polysaccharide-23 03/09/2007  . Td 04/02/2008  . Zoster 03/07/2013   Screening Tests Health Maintenance  Topic Date Due  . OPHTHALMOLOGY EXAM  05/21/2017  . INFLUENZA VACCINE  10/21/2017  . TETANUS/TDAP  04/02/2018  . FOOT EXAM  05/18/2018  . PNA vac Low Risk Adult  Completed       Plan:    Please ask [facility] to send eye exam report to Dr. Jenny Reichmann fax# [phone] during your upcoming visit.   Continue to eat heart healthy diet (full of fruits, vegetables, whole grains, lean protein, water--limit salt, fat, and sugar intake) and increase physical activity as tolerated.  Continue doing brain stimulating activities (puzzles, reading, adult coloring books, staying active) to keep memory sharp.   I have personally reviewed and noted the following in the patient's chart:   . Medical and social history . Use of alcohol, tobacco or illicit drugs  . Current medications and supplements . Functional ability and status . Nutritional status . Physical activity . Advanced directives . List of other physicians . Vitals . Screenings to include cognitive, depression, and falls . Referrals and appointments  In addition, I have reviewed and discussed with patient certain preventive protocols, quality metrics, and best practice recommendations. A written personalized care plan for preventive services as well as general preventive health recommendations were provided to patient.     Michiel Cowboy, RN  07/22/2017 Medical screening examination/treatment/procedure(s) were performed by non-physician practitioner and as supervising physician I was immediately available for consultation/collaboration. I agree with above. Cathlean Cower, MD

## 2017-07-28 ENCOUNTER — Other Ambulatory Visit: Payer: Self-pay | Admitting: Internal Medicine

## 2017-08-02 ENCOUNTER — Other Ambulatory Visit: Payer: Self-pay | Admitting: Internal Medicine

## 2017-08-02 ENCOUNTER — Other Ambulatory Visit: Payer: Self-pay

## 2017-08-02 MED ORDER — ATORVASTATIN CALCIUM 20 MG PO TABS: 20.0000 mg | ORAL_TABLET | Freq: Every day | ORAL | 1 refills | Status: DC

## 2017-08-02 MED ORDER — LOSARTAN POTASSIUM-HCTZ 50-12.5 MG PO TABS
1.0000 | ORAL_TABLET | Freq: Every day | ORAL | 1 refills | Status: DC
Start: 2017-08-02 — End: 2018-04-04

## 2017-08-02 MED ORDER — GLIPIZIDE ER 10 MG PO TB24: 10.0000 mg | ORAL_TABLET | Freq: Every day | ORAL | 1 refills | Status: DC

## 2017-08-02 MED ORDER — LOSARTAN POTASSIUM-HCTZ 50-12.5 MG PO TABS: 1.0000 | ORAL_TABLET | Freq: Every day | ORAL | 1 refills | Status: DC

## 2017-08-02 MED ORDER — NEXIUM 40 MG PO CPDR: 40.0000 mg | DELAYED_RELEASE_CAPSULE | Freq: Every day | ORAL | 1 refills | Status: DC

## 2017-08-05 ENCOUNTER — Other Ambulatory Visit: Payer: Self-pay

## 2017-08-05 MED ORDER — NEXIUM 40 MG PO CPDR: 40.0000 mg | DELAYED_RELEASE_CAPSULE | Freq: Every day | ORAL | 1 refills | Status: DC

## 2017-08-07 ENCOUNTER — Other Ambulatory Visit: Payer: Self-pay | Admitting: Internal Medicine

## 2017-08-10 ENCOUNTER — Other Ambulatory Visit: Payer: Self-pay | Admitting: Internal Medicine

## 2017-08-10 DIAGNOSIS — E119 Type 2 diabetes mellitus without complications: Secondary | ICD-10-CM

## 2017-08-18 LAB — HM DIABETES EYE EXAM

## 2017-09-28 ENCOUNTER — Telehealth: Payer: Self-pay | Admitting: Internal Medicine

## 2017-09-28 NOTE — Telephone Encounter (Signed)
Copied from [place] [id number]. Topic: General - Other >> Sep 28, 2017  3:49 PM Cecelia Byars, NT wrote: Reason for CRM: Patient says the pharmacy  needs a prior authorization for  [place] 40 MG capsule  , sent to  [place], [place] - [address] [phone] (Phone) [phone] (Fax

## 2017-10-04 ENCOUNTER — Telehealth: Payer: Self-pay | Admitting: Internal Medicine

## 2017-10-04 NOTE — Telephone Encounter (Signed)
Pt has been informed and expressed understanding. He stated that during his inquiry about purchasing they informed his no lifting is involved with the particular one he is looking into.

## 2017-10-04 NOTE — Telephone Encounter (Signed)
Patient states he is thinking about purchasing a travel trailer.  Patient would like to know if Dr. Jenny Reichmann thinks he is well enough to handle a travel trailer?

## 2017-10-04 NOTE — Telephone Encounter (Signed)
Overall I would think he would be ok, but should find out how much lifting is involved during the hookup process, as overall strength I think may be the limiting factor

## 2017-10-25 ENCOUNTER — Other Ambulatory Visit: Payer: Self-pay | Admitting: Internal Medicine

## 2017-11-08 ENCOUNTER — Other Ambulatory Visit: Payer: Self-pay | Admitting: Internal Medicine

## 2017-11-08 NOTE — Telephone Encounter (Signed)
I doubt his insurance would pay for this with just changing the pharmacy  Please ask pt to check with his insurance to see if they will get this glucometer and sensors [facility], otherwise I have nothing to help this

## 2017-11-08 NOTE — Telephone Encounter (Signed)
Called pt, LVM.   

## 2017-11-08 NOTE — Telephone Encounter (Signed)
Copied from [place] [phone] [phone]. Topic: Quick Communication - Rx Refill/Question >> Nov 08, 2017 11:00 AM Scherrie Gerlach wrote: Medication: Continuous Blood Gluc Receiver (FREESTYLE LIBRE 14 DAY READER) DEVI  Continuous Blood Gluc Sensor (FREESTYLE LIBRE 14 DAY SENSOR) MISC  Pt states [facility] never filled this for him because they said they could not, insurance will not pay.  Pt states he need this sent to  [place], [place] [phone] (Phone) [phone] (Fax)  Pt states IF his insurance/ [facility] cannot fill this for him, pt needs  Freestyle lite strips (pt has a regular freestyle meter now)  but would prefer the [place] if he can get.  [place], [place] [phone] (Phone) [phone] (Fax)   Pt is going to be out of his strips so he needs something. Pt test BID.

## 2017-11-08 NOTE — Telephone Encounter (Signed)
Dr. Jenny Reichmann is this something that the pt would have to pay for since he is insisting on getting this particular brand since insurance will not cover it? Please advise.

## 2017-11-09 MED ORDER — FREESTYLE LIBRE 14 DAY SENSOR MISC: 1.0000 | 3 refills | Status: DC

## 2017-11-09 MED ORDER — GLUCOSE BLOOD VI STRP: 1.0000 | ORAL_STRIP | 0 refills | Status: DC | PRN

## 2017-11-09 MED ORDER — FREESTYLE LIBRE 14 DAY READER DEVI: 1.0000 | Freq: Every day | 0 refills | Status: DC

## 2017-11-09 NOTE — Addendum Note (Signed)
Addended by: [name] on: 11/09/2017 04:26 PM   Modules accepted: Orders

## 2017-11-09 NOTE — Addendum Note (Signed)
Addended by: Juliet Rude on: 11/09/2017 09:26 AM   Modules accepted: Orders

## 2017-11-09 NOTE — Telephone Encounter (Signed)
Pt states he just spoke with [employer] and medicare and they WILL pay/cover the Continuous Blood Gluc Receiver (FREESTYLE LIBRE 14 DAY READER) DEVI  Continuous Blood Gluc Sensor (FREESTYLE LIBRE 14 DAY SENSOR) MISC  As long as it is sent to [facility].  Pt does not want you to cancel the scripts. May need some.  Would like to get the new Elenor Legato and come into the office for instructions on how to use.  [place], [place] - [address] [phone] (Phone) [phone] (Fax)

## 2017-11-09 NOTE — Telephone Encounter (Signed)
Ok this is done 

## 2017-11-09 NOTE — Telephone Encounter (Signed)
done

## 2017-11-09 NOTE — Telephone Encounter (Signed)
Pt states he will check with them and wil worry about that later.  BUT please send in the Rx for his regular strips, Freestyle light (50 in a box) Pt checks BID.  Pt is out and needs to check his blood sugar. [facility] states [facility] request 10 days ago.   [place], [place] - [address] [phone] (Phone) [phone] (Fax)

## 2017-11-17 ENCOUNTER — Encounter: Payer: Self-pay | Admitting: Internal Medicine

## 2017-11-17 ENCOUNTER — Ambulatory Visit (INDEPENDENT_AMBULATORY_CARE_PROVIDER_SITE_OTHER): Payer: Medicare Other | Admitting: Internal Medicine

## 2017-11-17 ENCOUNTER — Other Ambulatory Visit: Payer: Self-pay | Admitting: Internal Medicine

## 2017-11-17 ENCOUNTER — Other Ambulatory Visit (INDEPENDENT_AMBULATORY_CARE_PROVIDER_SITE_OTHER): Payer: Medicare Other

## 2017-11-17 VITALS — BP 138/86 | HR 88 | Temp 98.4°F | Ht 65.0 in | Wt 198.0 lb

## 2017-11-17 DIAGNOSIS — J449 Chronic obstructive pulmonary disease, unspecified: Secondary | ICD-10-CM | POA: Diagnosis not present

## 2017-11-17 DIAGNOSIS — N320 Bladder-neck obstruction: Secondary | ICD-10-CM

## 2017-11-17 DIAGNOSIS — E785 Hyperlipidemia, unspecified: Secondary | ICD-10-CM

## 2017-11-17 DIAGNOSIS — E119 Type 2 diabetes mellitus without complications: Secondary | ICD-10-CM

## 2017-11-17 DIAGNOSIS — I10 Essential (primary) hypertension: Secondary | ICD-10-CM

## 2017-11-17 LAB — URINALYSIS, ROUTINE W REFLEX MICROSCOPIC
Bilirubin Urine: NEGATIVE
Hgb urine dipstick: NEGATIVE
Ketones, ur: NEGATIVE
Leukocytes, UA: NEGATIVE
Nitrite: NEGATIVE
RBC / HPF: NONE SEEN (ref 0–?)
Specific Gravity, Urine: 1.01 (ref 1.000–1.030)
Total Protein, Urine: NEGATIVE
Urine Glucose: NEGATIVE
Urobilinogen, UA: 0.2 (ref 0.0–1.0)
WBC, UA: NONE SEEN (ref 0–?)
pH: 6 (ref 5.0–8.0)

## 2017-11-17 LAB — CBC WITH DIFFERENTIAL/PLATELET
Basophils Absolute: 0.1 10*3/uL (ref 0.0–0.1)
Basophils Relative: 1.4 % (ref 0.0–3.0)
Eosinophils Absolute: 0.1 10*3/uL (ref 0.0–0.7)
Eosinophils Relative: 1.6 % (ref 0.0–5.0)
HCT: 39.6 % (ref 39.0–52.0)
Hemoglobin: 12.8 g/dL — ABNORMAL LOW (ref 13.0–17.0)
Lymphocytes Relative: 29.9 % (ref 12.0–46.0)
Lymphs Abs: 1.3 10*3/uL (ref 0.7–4.0)
MCHC: 32.3 g/dL (ref 30.0–36.0)
MCV: 88.2 fl (ref 78.0–100.0)
Monocytes Absolute: 0.4 10*3/uL (ref 0.1–1.0)
Monocytes Relative: 8.9 % (ref 3.0–12.0)
Neutro Abs: 2.6 10*3/uL (ref 1.4–7.7)
Neutrophils Relative %: 58.2 % (ref 43.0–77.0)
Platelets: 211 10*3/uL (ref 150.0–400.0)
RBC: 4.49 Mil/uL (ref 4.22–5.81)
RDW: 17 % — ABNORMAL HIGH (ref 11.5–15.5)
WBC: 4.5 10*3/uL (ref 4.0–10.5)

## 2017-11-17 LAB — LIPID PANEL
Cholesterol: 145 mg/dL (ref 0–200)
HDL: 49.4 mg/dL (ref >39.00)
LDL Cholesterol: 86 mg/dL (ref 0–99)
NonHDL: 95.75
Total CHOL/HDL Ratio: 3
Triglycerides: 49 mg/dL (ref 0.0–149.0)
VLDL: 9.8 mg/dL (ref 0.0–40.0)

## 2017-11-17 LAB — BASIC METABOLIC PANEL
BUN: 34 mg/dL — ABNORMAL HIGH (ref 6–23)
CO2: 31 mEq/L (ref 19–32)
Calcium: 9.9 mg/dL (ref 8.4–10.5)
Chloride: 103 mEq/L (ref 96–112)
Creatinine, Ser: 1.09 mg/dL (ref 0.40–1.50)
GFR: 83.9 mL/min (ref >60.00)
Glucose, Bld: 105 mg/dL — ABNORMAL HIGH (ref 70–99)
Potassium: 4.4 mEq/L (ref 3.5–5.1)
Sodium: 141 mEq/L (ref 135–145)

## 2017-11-17 LAB — PSA: PSA: 5.03 ng/mL — ABNORMAL HIGH (ref 0.10–4.00)

## 2017-11-17 LAB — HEPATIC FUNCTION PANEL
ALT: 21 U/L (ref 0–53)
AST: 18 U/L (ref 0–37)
Albumin: 4.2 g/dL (ref 3.5–5.2)
Alkaline Phosphatase: 40 U/L (ref 39–117)
Bilirubin, Direct: 0.1 mg/dL (ref 0.0–0.3)
Total Bilirubin: 0.5 mg/dL (ref 0.2–1.2)
Total Protein: 6.5 g/dL (ref 6.0–8.3)

## 2017-11-17 LAB — MICROALBUMIN / CREATININE URINE RATIO
Creatinine,U: 45.6 mg/dL
Microalb Creat Ratio: 1.5 mg/g (ref 0.0–30.0)
Microalb, Ur: <0.7 mg/dL (ref 0.0–1.9)

## 2017-11-17 LAB — TSH: TSH: 1.45 u[IU]/mL (ref 0.35–4.50)

## 2017-11-17 LAB — HEMOGLOBIN A1C: Hgb A1c MFr Bld: 8 % — ABNORMAL HIGH (ref 4.6–6.5)

## 2017-11-17 MED ORDER — EMPAGLIFLOZIN 10 MG PO TABS: 10.0000 mg | ORAL_TABLET | Freq: Every day | ORAL | 3 refills | Status: DC

## 2017-11-17 MED ORDER — FLUTICASONE-UMECLIDIN-VILANT 100-62.5-25 MCG/INH IN AEPB: 1.0000 | INHALATION_SPRAY | Freq: Every day | RESPIRATORY_TRACT | 3 refills | Status: DC

## 2017-11-17 NOTE — Progress Notes (Deleted)
   Subjective:    Patient ID: Allen Hunter., male    DOB: 1940/11/15, 77 y.o.   MRN: [id number]   Has overall been somewhat less active at gym, wt stable.  Wt Readings from Last 3 Encounters:  11/17/17 198 lb (89.8 kg)  07/22/17 198 lb (89.8 kg)  05/18/17 197 lb (89.4 kg)     Review of Systems     Objective:   Physical Exam        Assessment & Plan:

## 2017-11-17 NOTE — Assessment & Plan Note (Signed)
stable overall by history and exam, recent data reviewed with pt, and pt to continue medical treatment as before,  to f/u any worsening symptoms or concerns BP Readings from Last 3 Encounters:  11/17/17 138/86  07/22/17 (!) 146/72  05/18/17 128/82

## 2017-11-17 NOTE — Progress Notes (Signed)
Subjective:    Patient ID: [name], male    DOB: 10-15-1940, 77 y.o.   MRN: [id number]  HPI  Here to f/u; overall doing ok,  Pt denies chest pain, wheezing, orthopnea, PND, increased LE swelling, palpitations, dizziness or syncope, but has had mild worsening sob/doe not so well controlled now with symbicort.  No fever.  Pt denies new neurological symptoms such as new headache, or facial or extremity weakness or numbness.  Pt denies polydipsia, polyuria, or low sugar episode.  Pt states overall good compliance with meds, mostly trying to follow appropriate diet, with wt overall stable.  Has been going to the gym less lately but still several times per wk Past Medical History:  Diagnosis Date  . ALLERGIC RHINITIS 10/04/2008  . ASTHMA 10/04/2008  . BACK PAIN 04/18/2008  . BENIGN PROSTATIC HYPERTROPHY 03/09/2007  . COLONIC POLYPS, HX OF 03/09/2007  . COPD 10/20/2007  . DIABETES MELLITUS, TYPE II 03/09/2007  . FATIGUE 10/05/2007  . GERD (gastroesophageal reflux disease) 03/01/2012  . GOUT 10/19/2006  . HYPERLIPIDEMIA 10/19/2006  . HYPERTENSION 10/19/2006  . OSA (obstructive sleep apnea) 07/19/2015  . Prostate cancer ([place]) 01/19/2012  . SWELLING MASS OR LUMP IN HEAD AND NECK 01/09/2010   Past Surgical History:  Procedure Laterality Date  . APPENDECTOMY      reports that he has quit smoking. He has never used smokeless tobacco. He reports that he does not drink alcohol or use drugs. family history includes Cancer in his other; Hypertension in his mother. No Known Allergies Current Outpatient Medications on File Prior to Visit  Medication Sig Dispense Refill  . albuterol (PROVENTIL HFA;VENTOLIN HFA) 108 (90 BASE) MCG/ACT inhaler Inhale 2 puffs into the lungs 4 (four) times daily. 3 Inhaler 3  . allopurinol (ZYLOPRIM) 300 MG tablet TAKE 1 TABLET DAILY 90 tablet 2  . aspirin 81 MG tablet Take 81 mg by mouth daily.      Marland Kitchen atorvastatin (LIPITOR) 20 MG tablet Take 1 tablet (20 mg total) by  mouth daily. 90 tablet 1  . Continuous Blood Gluc Receiver (FREESTYLE LIBRE 14 DAY READER) DEVI Apply 1 Device topically daily. E11.9 1 Device 0  . Continuous Blood Gluc Sensor (FREESTYLE LIBRE 14 DAY SENSOR) MISC Apply 1 Device topically every 14 (fourteen) days. 6 each 3  . glipiZIDE (GLUCOTROL XL) 10 MG 24 hr tablet Take 1 tablet (10 mg total) by mouth daily with breakfast. 90 tablet 1  . glucose blood test strip 1 each by Other route as needed. Check sugar twice a day. E11.9 Freestyle Lite Strips 100 each 0  . Lancets MISC Use asd 1 per day 250.02 100 each 11  . losartan-hydrochlorothiazide (HYZAAR) 50-12.5 MG tablet Take 1 tablet by mouth daily. 90 tablet 1  . metFORMIN (GLUCOPHAGE) 1000 MG tablet TAKE 1 TABLET(1000 MG) BY MOUTH TWICE DAILY WITH A MEAL 60 tablet 2  . montelukast (SINGULAIR) 10 MG tablet TAKE 1 TABLET (10MG  TOTAL ) BY MOUTH AT BEDTIME 90 tablet 2  . NEXIUM 40 MG capsule TAKE 1 CAPSULE DAILY 90 capsule 1  . Omega-3 Fatty Acids (FISH OIL) 500 MG CAPS Take by mouth daily.      . tamsulosin (FLOMAX) 0.4 MG CAPS capsule Take 1 capsule (0.4 mg total) by mouth daily. 90 capsule 3  . triamcinolone (NASACORT AQ) 55 MCG/ACT AERO nasal inhaler Place 2 sprays into the nose daily. 1 Inhaler 12  . VIAGRA 100 MG tablet TAKE ONE-HALF (1/2) TO ONE TABLET (  50 MG TO 100 MG) DAILY AS NEEDED FOR ERECTILE DYSFUNCTION 18 tablet 0  . cetirizine (ZYRTEC) 10 MG tablet Take 1 tablet (10 mg total) by mouth daily. 30 tablet 11   No current facility-administered medications on file prior to visit.    Review of Systems  Constitutional: Negative for other unusual diaphoresis or sweats HENT: Negative for ear discharge or swelling Eyes: Negative for other worsening visual disturbances Respiratory: Negative for stridor or other swelling  Gastrointestinal: Negative for worsening distension or other blood Genitourinary: Negative for retention or other urinary change Musculoskeletal: Negative for other MSK  pain or swelling Skin: Negative for color change or other new lesions Neurological: Negative for worsening tremors and other numbness  Psychiatric/Behavioral: Negative for worsening agitation or other fatigue All other system neg per pt    Objective:   Physical Exam BP 138/86   Pulse 88   Temp 98.4 F (36.9 C) (Oral)   Ht 5\' 5"  (1.651 m)   Wt 198 lb (89.8 kg)   SpO2 96%   BMI 32.95 kg/m  VS noted,  Constitutional: Pt appears in NAD HENT: Head: NCAT.  Right Ear: External ear normal.  Left Ear: External ear normal.  Eyes: . Pupils are equal, round, and reactive to light. Conjunctivae and EOM are normal Nose: without d/c or deformity Neck: Neck supple. Gross normal ROM Cardiovascular: Normal rate and regular rhythm.   Pulmonary/Chest: Effort normal and breath sounds decreased without rales or wheezing.  Abd:  Soft, NT, ND, + BS, no organomegaly Neurological: Pt is alert. At baseline orientation, motor grossly intact Skin: Skin is warm. No rashes, other new lesions, no LE edema Psychiatric: Pt behavior is normal without agitation  No other exam findings Lab Results  Component Value Date   WBC 4.6 05/18/2017   HGB 13.7 05/18/2017   HCT 42.6 05/18/2017   PLT 215.0 05/18/2017   GLUCOSE 147 (H) 05/18/2017   CHOL 139 05/18/2017   TRIG 41.0 05/18/2017   HDL 51.70 05/18/2017   LDLCALC 79 05/18/2017   ALT 17 05/18/2017   AST 15 05/18/2017   NA 141 05/18/2017   K 4.9 05/18/2017   CL 102 05/18/2017   CREATININE 1.13 05/18/2017   BUN 30 (H) 05/18/2017   CO2 33 (H) 05/18/2017   TSH 1.57 05/18/2017   PSA 5.19 (H) 05/18/2017   HGBA1C 7.6 (H) 05/18/2017   MICROALBUR 1.8 05/18/2017       Assessment & Plan:

## 2017-11-17 NOTE — Assessment & Plan Note (Signed)
Not as well controlled recently, for change symbicort to trelegy asd,  to f/u any worsening symptoms or concerns

## 2017-11-17 NOTE — Assessment & Plan Note (Signed)
stable overall by history and exam, recent data reviewed with pt, and pt to continue medical treatment as before,  to f/u any worsening symptoms or concerns Lab Results  Component Value Date   LDLCALC 79 05/18/2017

## 2017-11-17 NOTE — Assessment & Plan Note (Signed)
stable overall by history and exam, recent data reviewed with pt, and pt to continue medical treatment as before,  to f/u any worsening symptoms or concerns Lab Results  Component Value Date   HGBA1C 7.6 (H) 05/18/2017

## 2017-11-17 NOTE — Patient Instructions (Signed)
OK to change the Symbicort to the Trelegy inhaler [facility])  Please continue all other medications as before, and refills have been done if requested.  Please have the pharmacy call with any other refills you may need.  Please continue your efforts at being more active, low cholesterol diet, and weight control.  You are otherwise up to date with prevention measures today.  Please keep your appointments with your specialists as you may have planned - Dr Alinda Money  Please go to the LAB in the Basement (turn left off the elevator) for the tests to be done today  You will be contacted by phone if any changes need to be made immediately.  Otherwise, you will receive a letter about your results with an explanation, but please check with MyChart first.  Please remember to sign up for MyChart if you have not done so, as this will be important to you in the future with finding out test results, communicating by private email, and scheduling acute appointments online when needed.  Please return in 6 months, or sooner if needed

## 2017-11-17 NOTE — Assessment & Plan Note (Signed)
For psa as well as he is due

## 2017-11-18 ENCOUNTER — Telehealth: Payer: Self-pay

## 2017-11-18 NOTE — Telephone Encounter (Signed)
Pt has viewed results via MyChart  

## 2017-11-18 NOTE — Telephone Encounter (Signed)
-----   Message from [name], MD sent at 11/17/2017  5:08 PM EDT ----- See below

## 2017-11-29 ENCOUNTER — Telehealth: Payer: Self-pay | Admitting: Internal Medicine

## 2017-11-29 NOTE — Telephone Encounter (Signed)
Pt called at night asking if he is supposed to take Metformin and Jardiance together.  Please advise and call back

## 2017-11-29 NOTE — Telephone Encounter (Signed)
Actually should be taking the jardiance, metformin and the glipizide  Hopefully he has been doing this, as each medication only works so much and his sugars have been going up recently

## 2017-11-30 NOTE — Telephone Encounter (Signed)
Tried contacting pt. Unable to LVM.

## 2017-12-02 ENCOUNTER — Emergency Department
Admission: EM | Admit: 2017-12-02 | Discharge: 2017-12-02 | Disposition: A | Discharge status: Home / Self Care | Payer: Medicare Other | Attending: Emergency Medicine | Admitting: Emergency Medicine

## 2017-12-02 ENCOUNTER — Other Ambulatory Visit: Payer: Self-pay

## 2017-12-02 ENCOUNTER — Telehealth: Payer: Self-pay | Admitting: Internal Medicine

## 2017-12-02 DIAGNOSIS — Z7982 Long term (current) use of aspirin: Secondary | ICD-10-CM | POA: Insufficient documentation

## 2017-12-02 DIAGNOSIS — J449 Chronic obstructive pulmonary disease, unspecified: Secondary | ICD-10-CM | POA: Insufficient documentation

## 2017-12-02 DIAGNOSIS — Z87891 Personal history of nicotine dependence: Secondary | ICD-10-CM | POA: Insufficient documentation

## 2017-12-02 DIAGNOSIS — Z79899 Other long term (current) drug therapy: Secondary | ICD-10-CM | POA: Insufficient documentation

## 2017-12-02 DIAGNOSIS — Z7984 Long term (current) use of oral hypoglycemic drugs: Secondary | ICD-10-CM | POA: Insufficient documentation

## 2017-12-02 DIAGNOSIS — E119 Type 2 diabetes mellitus without complications: Secondary | ICD-10-CM | POA: Diagnosis not present

## 2017-12-02 DIAGNOSIS — L239 Allergic contact dermatitis, unspecified cause: Secondary | ICD-10-CM | POA: Insufficient documentation

## 2017-12-02 DIAGNOSIS — I10 Essential (primary) hypertension: Secondary | ICD-10-CM | POA: Diagnosis not present

## 2017-12-02 DIAGNOSIS — Z8546 Personal history of malignant neoplasm of prostate: Secondary | ICD-10-CM | POA: Diagnosis not present

## 2017-12-02 DIAGNOSIS — R21 Rash and other nonspecific skin eruption: Secondary | ICD-10-CM | POA: Diagnosis present

## 2017-12-02 NOTE — Telephone Encounter (Signed)
This is the third note  Not sure why this keeps being a problem  Please take ALL medications as prescribed

## 2017-12-02 NOTE — ED Provider Notes (Signed)
[facility] Emergency Department Provider Note  ____________________________________________   First MD Initiated Contact with Patient 12/02/17 [phone]     (approximate)  I have reviewed the triage vital signs and the nursing notes.   HISTORY  Chief Complaint Rash   HPI Allen Hunter. is a 77 y.o. male self presents to the emergency department with several days of itching rash across his upper chest.  Symptoms came on slowly have been progressive and are now moderate severity.  He is used over-the-counter hydrocortisone with minimal relief.  He said this happened to him several months ago and he was prescribed an unknown other cream which "cleared everything right up".  He denies fevers or chills.  He denies pain.  It is quite itchy.    Past Medical History:  Diagnosis Date  . ALLERGIC RHINITIS 10/04/2008  . ASTHMA 10/04/2008  . BACK PAIN 04/18/2008  . BENIGN PROSTATIC HYPERTROPHY 03/09/2007  . COLONIC POLYPS, HX OF 03/09/2007  . COPD 10/20/2007  . DIABETES MELLITUS, TYPE II 03/09/2007  . FATIGUE 10/05/2007  . GERD (gastroesophageal reflux disease) 03/01/2012  . GOUT 10/19/2006  . HYPERLIPIDEMIA 10/19/2006  . HYPERTENSION 10/19/2006  . OSA (obstructive sleep apnea) 07/19/2015  . Prostate cancer ([place]) 01/19/2012  . SWELLING MASS OR LUMP IN HEAD AND NECK 01/09/2010    Patient Active Problem List   Diagnosis Date Noted  . OSA (obstructive sleep apnea) 07/19/2015  . Asthma with acute exacerbation 04/10/2014  . Unspecified asthma, with exacerbation 09/05/2013  . GERD (gastroesophageal reflux disease) 03/01/2012  . Diarrhea 03/01/2012  . Hypersomnolence 01/19/2012  . Prostate cancer ([place]) 01/19/2012  . 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 Increased prostate specific antigen (PSA) velocity 01/21/2011  . Erectile dysfunction 01/20/2011  . Bladder neck obstruction 01/20/2011  . Preventative health care 01/18/2011  . Allergic rhinitis 10/04/2008  . Asthma  10/04/2008  . BACK PAIN 04/18/2008  . COPD (chronic obstructive pulmonary disease) ([place]) 10/20/2007  . Fatigue 10/05/2007  . Diabetes ([place]) 03/09/2007  . BENIGN PROSTATIC HYPERTROPHY 03/09/2007  . COLONIC POLYPS, HX OF 03/09/2007  . Hyperlipidemia 10/19/2006  . GOUT 10/19/2006  . Essential hypertension 10/19/2006    Past Surgical History:  Procedure Laterality Date  . APPENDECTOMY      Prior to Admission medications   Medication Sig Start Date End Date Taking? Authorizing Provider  albuterol (PROVENTIL HFA;VENTOLIN HFA) 108 (90 BASE) MCG/ACT inhaler Inhale 2 puffs into the lungs 4 (four) times daily. 01/20/11   [name], MD  allopurinol (ZYLOPRIM) 300 MG tablet TAKE 1 TABLET DAILY 03/24/17   [name], MD  aspirin 81 MG tablet Take 81 mg by mouth daily.      [provider]  atorvastatin (LIPITOR) 20 MG tablet Take 1 tablet (20 mg total) by mouth daily. 08/02/17   [name], MD  cetirizine (ZYRTEC) 10 MG tablet Take 1 tablet (10 mg total) by mouth daily. 07/21/16 07/21/17  [name], MD  Continuous Blood Gluc Receiver (FREESTYLE LIBRE 14 DAY READER) DEVI Apply 1 Device topically daily. E11.9 11/09/17   [name], MD  Continuous Blood Gluc Sensor (FREESTYLE LIBRE 14 DAY SENSOR) MISC Apply 1 Device topically every 14 (fourteen) days. 11/09/17   [name], MD  empagliflozin (JARDIANCE) 10 MG TABS tablet Take 10 mg by mouth daily. 11/17/17   [name], MD  Fluticasone-Umeclidin-Vilant (TRELEGY ELLIPTA) 100-62.5-25 MCG/INH AEPB Inhale 1 puff into the lungs daily. 11/17/17   Cathlean Cower  W, MD  glipiZIDE (GLUCOTROL XL) 10 MG 24 hr tablet Take 1 tablet (10 mg total) by mouth daily with breakfast. 08/02/17   [name], MD  glucose blood test strip 1 each by Other route as needed. Check sugar twice a day. E11.9 Freestyle Lite Strips 11/09/17   [name], MD  Lancets MISC Use asd 1 per day 250.02 10/26/13   [name], MD  losartan-hydrochlorothiazide [facility])  50-12.5 MG tablet Take 1 tablet by mouth daily. 08/02/17   [name], MD  metFORMIN (GLUCOPHAGE) 1000 MG tablet TAKE 1 TABLET(1000 MG) BY MOUTH TWICE DAILY WITH A MEAL 08/10/17   [name], MD  montelukast (SINGULAIR) 10 MG tablet TAKE 1 TABLET (10MG  TOTAL ) BY MOUTH AT BEDTIME 01/18/13   [name], MD  NEXIUM 40 MG capsule TAKE 1 CAPSULE DAILY 10/25/17   [name], MD  Omega-3 Fatty Acids (FISH OIL) 500 MG CAPS Take by mouth daily.      [provider]  tamsulosin (FLOMAX) 0.4 MG CAPS capsule Take 1 capsule (0.4 mg total) by mouth daily. 05/18/17   [name], MD  triamcinolone (NASACORT AQ) 55 MCG/ACT AERO nasal inhaler Place 2 sprays into the nose daily. 07/21/16   [name], MD  VIAGRA 100 MG tablet TAKE ONE-HALF (1/2) TO ONE TABLET (50 MG TO 100 MG) DAILY AS NEEDED FOR ERECTILE DYSFUNCTION 08/09/17   [name], MD    Allergies Patient has no known allergies.  Family History  Problem Relation Age of Onset  . Hypertension Mother   . Cancer Other     Social History Social History   Tobacco Use  . Smoking status: Former Research scientist (life sciences)  . Smokeless tobacco: Never Used  Substance Use Topics  . Alcohol use: No  . Drug use: Never    Review of Systems Constitutional: No fever/chills Cardiovascular: Denies chest pain. Respiratory: Denies shortness of breath. Gastrointestinal: No abdominal pain.   Musculoskeletal: Negative for back pain. Skin: Positive for rash   ____________________________________________   PHYSICAL EXAM:  VITAL SIGNS: ED Triage Vitals  Enc Vitals Group     BP 12/02/17 0057 128/76     Pulse Rate 12/02/17 0057 81     Resp 12/02/17 0057 17     Temp 12/02/17 0057 98.8 F (37.1 C)     Temp Source 12/02/17 0057 Oral     SpO2 12/02/17 0057 95 %     Weight 12/02/17 0056 200 lb (90.7 kg)     Height 12/02/17 0056 4\' 7"  (1.397 m)     Head Circumference --      Peak Flow --      Pain Score 12/02/17 0056 0     Pain Loc --      Pain Edu?  --      Excl. in [place]? --     Constitutional: Alert and oriented x4 somewhat anxious appearing although nontoxic no diaphoresis  Cardiovascular: Regular rate and rhythm Respiratory: Normal respiratory effort.  No retractions. Neurologic:  Normal speech and language. No gross focal neurologic deficits are appreciated.  Skin: Mild urticarial rash across upper chest no evidence of infection    ____________________________________________  LABS (all labs ordered are listed, but only abnormal results are displayed)  Labs Reviewed - No data to display   __________________________________________  EKG   ____________________________________________  RADIOLOGY   ____________________________________________   DIFFERENTIAL includes but not limited to  Urticaria, allergic reaction, anaphylaxis, Stevens-Johnson syndrome,  folliculitis   PROCEDURES  Procedure(s) performed: no  Procedures  Critical Care performed: no  ____________________________________________   INITIAL IMPRESSION / ASSESSMENT AND PLAN / ED COURSE  Pertinent labs & imaging results that were available during my care of the patient were reviewed by me and considered in my medical decision making (see chart for details).   As part of my medical decision making, I reviewed the following data within the [place] History obtained from family if available, nursing notes, old chart and ekg, as well as notes from prior ED visits.  The patient's rash is exquisitely itching across his upper chest and appears to be allergic.  On chart review he was prescribed triamcinolone before so I will prescribe that once again along with a short course of oral prednisone.  Strict return precautions have been given.      ____________________________________________   FINAL CLINICAL IMPRESSION(S) / ED DIAGNOSES  Final diagnoses:  Allergic dermatitis      NEW MEDICATIONS STARTED DURING THIS  VISIT:  Discharge Medication List as of 12/02/2017  6:28 AM       Note:  This document was prepared using Dragon voice recognition software and may include unintentional dictation errors.      Darel Hong, MD 12/03/17 1005

## 2017-12-02 NOTE — ED Triage Notes (Addendum)
Pt arrives to ED via POV with c/o rash on his neck x3 weeks. Pt denies pain, just states, "it itches". Pt reports using OTC topical creams without relief.

## 2017-12-02 NOTE — Telephone Encounter (Signed)
Copied from [place] [id number]. Topic: Quick Communication - See Telephone Encounter >> Dec 02, 2017 10:48 AM Ahmed Prima L wrote: CRM for notification. See Telephone encounter for: 12/02/17.  Patient said that Dr Jenny Reichmann just prescribed him empagliflozin (JARDIANCE) 10 MG TABS tablet on 8/28. He wants to know does he need to take this along with his metFORMIN (GLUCOPHAGE) 1000 MG tablet or drop taking the metFORMIN (GLUCOPHAGE) 1000 MG tablet? He is not sure what to do. Call back @ [phone]

## 2017-12-02 NOTE — Telephone Encounter (Signed)
Called pt, LVM with below details.

## 2017-12-12 ENCOUNTER — Other Ambulatory Visit: Payer: Self-pay | Admitting: Internal Medicine

## 2017-12-13 NOTE — Telephone Encounter (Signed)
Routing to dr Jenny Reichmann, if patient is using this med for gout, I do not have recent office visit with dr Jenny Reichmann, if used because of cancer tx, ok to refill----please advise if you are ok with refill, thanks

## 2017-12-26 ENCOUNTER — Other Ambulatory Visit: Payer: Self-pay | Admitting: Internal Medicine

## 2017-12-26 DIAGNOSIS — E119 Type 2 diabetes mellitus without complications: Secondary | ICD-10-CM

## 2017-12-28 DIAGNOSIS — C61 Malignant neoplasm of prostate: Secondary | ICD-10-CM | POA: Diagnosis not present

## 2018-01-02 ENCOUNTER — Other Ambulatory Visit: Payer: Self-pay | Admitting: Internal Medicine

## 2018-01-03 ENCOUNTER — Other Ambulatory Visit: Payer: Self-pay

## 2018-01-03 DIAGNOSIS — E119 Type 2 diabetes mellitus without complications: Secondary | ICD-10-CM

## 2018-01-03 MED ORDER — METFORMIN HCL 1000 MG PO TABS: ORAL_TABLET | ORAL | 1 refills | Status: DC

## 2018-01-05 DIAGNOSIS — R3912 Poor urinary stream: Secondary | ICD-10-CM | POA: Diagnosis not present

## 2018-01-05 DIAGNOSIS — C61 Malignant neoplasm of prostate: Secondary | ICD-10-CM | POA: Diagnosis not present

## 2018-01-05 DIAGNOSIS — N401 Enlarged prostate with lower urinary tract symptoms: Secondary | ICD-10-CM | POA: Diagnosis not present

## 2018-01-14 DIAGNOSIS — R0609 Other forms of dyspnea: Secondary | ICD-10-CM | POA: Diagnosis not present

## 2018-01-14 DIAGNOSIS — G4733 Obstructive sleep apnea (adult) (pediatric): Secondary | ICD-10-CM | POA: Diagnosis not present

## 2018-01-14 DIAGNOSIS — J449 Chronic obstructive pulmonary disease, unspecified: Secondary | ICD-10-CM | POA: Diagnosis not present

## 2018-01-14 DIAGNOSIS — J310 Chronic rhinitis: Secondary | ICD-10-CM | POA: Diagnosis not present

## 2018-02-06 ENCOUNTER — Other Ambulatory Visit: Payer: Self-pay | Admitting: Internal Medicine

## 2018-03-02 DIAGNOSIS — M79674 Pain in right toe(s): Secondary | ICD-10-CM | POA: Diagnosis not present

## 2018-03-02 DIAGNOSIS — M79675 Pain in left toe(s): Secondary | ICD-10-CM | POA: Diagnosis not present

## 2018-03-02 DIAGNOSIS — B351 Tinea unguium: Secondary | ICD-10-CM | POA: Diagnosis not present

## 2018-03-11 DIAGNOSIS — B9689 Other specified bacterial agents as the cause of diseases classified elsewhere: Secondary | ICD-10-CM | POA: Diagnosis not present

## 2018-03-11 DIAGNOSIS — J441 Chronic obstructive pulmonary disease with (acute) exacerbation: Secondary | ICD-10-CM | POA: Diagnosis not present

## 2018-03-11 DIAGNOSIS — J209 Acute bronchitis, unspecified: Secondary | ICD-10-CM | POA: Diagnosis not present

## 2018-03-11 DIAGNOSIS — J019 Acute sinusitis, unspecified: Secondary | ICD-10-CM | POA: Diagnosis not present

## 2018-04-02 ENCOUNTER — Other Ambulatory Visit: Payer: Self-pay | Admitting: Internal Medicine

## 2018-04-17 ENCOUNTER — Other Ambulatory Visit: Payer: Self-pay | Admitting: Internal Medicine

## 2018-04-29 ENCOUNTER — Telehealth: Payer: Self-pay | Admitting: Internal Medicine

## 2018-04-29 MED ORDER — NEXIUM 40 MG PO CPDR: 40.0000 mg | DELAYED_RELEASE_CAPSULE | Freq: Every day | ORAL | 3 refills | Status: DC

## 2018-04-29 NOTE — Addendum Note (Signed)
Addended by: [name] on: 04/29/2018 02:38 PM   Modules accepted: Orders

## 2018-04-29 NOTE — Telephone Encounter (Signed)
This is same as nexium - done erx

## 2018-04-29 NOTE — Telephone Encounter (Signed)
Copied from [place] [id number]. Topic: Quick Communication - Rx Refill/Question >> Apr 29, 2018 11:39 AM [place], Lanice Schwab wrote: Medication:  Esomeprazole Magnesium 40 MG TAKE 1 CAPSULE DAILY  Has the patient contacted their pharmacy? ,no they contacted him [facility].  Nexium needs Prior Authorization and they will not fill until received. Preferred Pharmacy  Agent: Please be advised that RX refills may take up to 3 business days. We ask that you follow-up with your pharmacy.

## 2018-05-04 ENCOUNTER — Telehealth: Payer: Self-pay | Admitting: Internal Medicine

## 2018-05-04 MED ORDER — NEXIUM 40 MG PO CPDR: 40.0000 mg | DELAYED_RELEASE_CAPSULE | Freq: Every day | ORAL | 3 refills | Status: DC

## 2018-05-04 NOTE — Telephone Encounter (Signed)
[facility] 

## 2018-05-04 NOTE — Telephone Encounter (Signed)
Copied from [place] [id number]. Topic: Quick Communication - Rx Refill/Question >> May 04, 2018 11:37 AM Alanda Slim E wrote: Medication: NEXIUM 40 MG capsule _ trying to get medication through [facility] but it was out of stock but is now available but [facility] needs clarification from Dr. Jenny Reichmann please call clarification line# [phone] so Pt can get the medication.  Has the patient contacted their pharmacy? Yes    Preferred Pharmacy (with phone number or street name): [place], [place] [phone] (Phone) [phone] (Fax)    Agent: Please be advised that RX refills may take up to 3 business days. We ask that you follow-up with your pharmacy.

## 2018-05-04 NOTE — Addendum Note (Signed)
Addended by: [name] on: 05/04/2018 01:11 PM   Modules accepted: Orders

## 2018-05-17 ENCOUNTER — Other Ambulatory Visit: Payer: Self-pay | Admitting: Internal Medicine

## 2018-05-17 ENCOUNTER — Telehealth: Payer: Self-pay

## 2018-05-17 ENCOUNTER — Ambulatory Visit (INDEPENDENT_AMBULATORY_CARE_PROVIDER_SITE_OTHER): Payer: Medicare Other | Admitting: Internal Medicine

## 2018-05-17 ENCOUNTER — Other Ambulatory Visit (INDEPENDENT_AMBULATORY_CARE_PROVIDER_SITE_OTHER): Payer: Medicare Other

## 2018-05-17 ENCOUNTER — Encounter: Payer: Self-pay | Admitting: Internal Medicine

## 2018-05-17 VITALS — BP 128/76 | HR 102 | Temp 97.8°F | Ht <= 58 in | Wt 197.0 lb

## 2018-05-17 DIAGNOSIS — K219 Gastro-esophageal reflux disease without esophagitis: Secondary | ICD-10-CM

## 2018-05-17 DIAGNOSIS — J449 Chronic obstructive pulmonary disease, unspecified: Secondary | ICD-10-CM

## 2018-05-17 DIAGNOSIS — R0609 Other forms of dyspnea: Secondary | ICD-10-CM | POA: Diagnosis not present

## 2018-05-17 DIAGNOSIS — E119 Type 2 diabetes mellitus without complications: Secondary | ICD-10-CM | POA: Diagnosis not present

## 2018-05-17 DIAGNOSIS — E785 Hyperlipidemia, unspecified: Secondary | ICD-10-CM | POA: Diagnosis not present

## 2018-05-17 DIAGNOSIS — I10 Essential (primary) hypertension: Secondary | ICD-10-CM | POA: Diagnosis not present

## 2018-05-17 DIAGNOSIS — Z23 Encounter for immunization: Secondary | ICD-10-CM

## 2018-05-17 DIAGNOSIS — R0600 Dyspnea, unspecified: Secondary | ICD-10-CM | POA: Insufficient documentation

## 2018-05-17 DIAGNOSIS — N179 Acute kidney failure, unspecified: Secondary | ICD-10-CM

## 2018-05-17 DIAGNOSIS — R220 Localized swelling, mass and lump, head: Secondary | ICD-10-CM

## 2018-05-17 LAB — BASIC METABOLIC PANEL
BUN: 42 mg/dL — ABNORMAL HIGH (ref 6–23)
CO2: 29 mEq/L (ref 19–32)
Calcium: 10 mg/dL (ref 8.4–10.5)
Chloride: 100 mEq/L (ref 96–112)
Creatinine, Ser: 1.72 mg/dL — ABNORMAL HIGH (ref 0.40–1.50)
GFR: 46.57 mL/min — ABNORMAL LOW (ref >60.00)
Glucose, Bld: 197 mg/dL — ABNORMAL HIGH (ref 70–99)
Potassium: 4.1 mEq/L (ref 3.5–5.1)
Sodium: 139 mEq/L (ref 135–145)

## 2018-05-17 LAB — LIPID PANEL
Cholesterol: 192 mg/dL (ref 0–200)
HDL: 53.7 mg/dL (ref >39.00)
LDL Cholesterol: 115 mg/dL — ABNORMAL HIGH (ref 0–99)
NonHDL: 138.06
Total CHOL/HDL Ratio: 4
Triglycerides: 117 mg/dL (ref 0.0–149.0)
VLDL: 23.4 mg/dL (ref 0.0–40.0)

## 2018-05-17 LAB — HEPATIC FUNCTION PANEL
ALT: 21 U/L (ref 0–53)
AST: 17 U/L (ref 0–37)
Albumin: 4.5 g/dL (ref 3.5–5.2)
Alkaline Phosphatase: 42 U/L (ref 39–117)
Bilirubin, Direct: 0.1 mg/dL (ref 0.0–0.3)
Total Bilirubin: 0.5 mg/dL (ref 0.2–1.2)
Total Protein: 7.1 g/dL (ref 6.0–8.3)

## 2018-05-17 LAB — HEMOGLOBIN A1C: Hgb A1c MFr Bld: 7.4 % — ABNORMAL HIGH (ref 4.6–6.5)

## 2018-05-17 MED ORDER — FREESTYLE LIBRE 14 DAY SENSOR MISC: 1.0000 | 3 refills | Status: DC

## 2018-05-17 MED ORDER — ESOMEPRAZOLE MAGNESIUM 40 MG PO CPDR: 40.0000 mg | DELAYED_RELEASE_CAPSULE | Freq: Every day | ORAL | 3 refills | Status: DC

## 2018-05-17 MED ORDER — FREESTYLE LIBRE 14 DAY READER DEVI: 1.0000 | Freq: Every day | 0 refills | Status: DC

## 2018-05-17 MED ORDER — ATORVASTATIN CALCIUM 40 MG PO TABS: 40.0000 mg | ORAL_TABLET | Freq: Every day | ORAL | 3 refills | Status: DC

## 2018-05-17 NOTE — Telephone Encounter (Signed)
I called [facility] PA dept. Nexium 40 mg PA initiated Case # for medication: [id number] Generic is Approved 04/17/18- 03/22/2098 Case # for brand Nexium: [id number].  Brand name Nexium is denied. Need new script for generic.  New Rx sent for esomeprazole 40 mg. See meds. Pt informed.

## 2018-05-17 NOTE — Telephone Encounter (Signed)
-----   Message from [name], MD sent at 05/17/2018 12:14 PM EST ----- Left message on MyChart, pt to cont same tx except  The test results show that your current treatment is OK, except there is evidence for a new mild kidney slowing, which can be chronic but this is not clear as it is new.  Please drink more fluids in the next week and return for a repeat kidney testing only in 1 week.  Also the A1c is still slightly high, as well as the LDL cholesterol.  I think we can leave the sugar alone for now to avoid low sugars if the kidneys are slowed, but we should increase the lipitor to 40 mg per day.  I will send the prescriptions, and you should hear from the office as well  Allen Hunter to please inform pt, I will do do the lab order, and rx x 1

## 2018-05-17 NOTE — Assessment & Plan Note (Addendum)
As per pulmonary - ? Cardiac etiology, for echo, refer cardiology  Note:  Total time for pt hx, exam, review of record with pt in the room, determination of diagnoses and plan for further eval and tx is > 40 min, with over 50% spent in coordination and counseling of patient including the differential dx, tx, further evaluation and other management of DOE, HLD, DM, HTN, GERD, head lumps

## 2018-05-17 NOTE — Assessment & Plan Note (Signed)
stable overall by history and exam, recent data reviewed with pt, and pt to continue medical treatment as before,  to f/u any worsening symptoms or concerns  

## 2018-05-17 NOTE — Assessment & Plan Note (Signed)
Moderate, stable, cont same tx,  to f/u any worsening symptoms or concerns

## 2018-05-17 NOTE — Assessment & Plan Note (Signed)
stable overall by history and exam, recent data reviewed with pt, and pt to continue medical treatment as before,  to f/u any worsening symptoms or concerns, for f/u a1c 

## 2018-05-17 NOTE — Assessment & Plan Note (Signed)
?   [place] - for ENT referral

## 2018-05-17 NOTE — Addendum Note (Signed)
Addended by: Cresenciano Lick on: 05/17/2018 09:40 AM   Modules accepted: Orders

## 2018-05-17 NOTE — Patient Instructions (Addendum)
You had the Tdap tetanus shot today  Your Freestyle libre prescriptions were sent to the local pharmacy  Please continue all other medications as before, and refills have been done if requested.  Please have the pharmacy call with any other refills you may need.  Please continue your efforts at being more active, low cholesterol diet, and weight control.  You are otherwise up to date with prevention measures today.  Please keep your appointments with your specialists as you may have planned  You will be contacted regarding the referral for: Echocardiogram, Cardiology, and ENT  Please go to the LAB in the Basement (turn left off the elevator) for the tests to be done today  You will be contacted by phone if any changes need to be made immediately.  Otherwise, you will receive a letter about your results with an explanation, but please check with MyChart first.  Please remember to sign up for MyChart if you have not done so, as this will be important to you in the future with finding out test results, communicating by private email, and scheduling acute appointments online when needed.  Please return in 6 months, or sooner if needed

## 2018-05-17 NOTE — Progress Notes (Signed)
Subjective:    Patient ID: Allen Dakin., male    DOB: 1941/03/07, 78 y.o.   MRN: [id number]  HPI  Here to f/u; overall doing ok,  Pt denies chest pain, increasing sob or doe, wheezing, orthopnea, PND, increased LE swelling, palpitations, dizziness or syncope.  Pt denies new neurological symptoms such as new headache, or facial or extremity weakness or numbness.  Pt denies polydipsia, polyuria, or low sugar episode.  Pt states overall good compliance with meds, mostly trying to follow appropriate diet, with wt overall stable,  but little exercise however.  Trying to lose wt with better diet.  Denies worsening reflux, abd pain, dysphagia, n/v, bowel change or blood. Wt Readings from Last 3 Encounters:  05/17/18 197 lb (89.4 kg)  12/02/17 200 lb (90.7 kg)  11/17/17 198 lb (89.8 kg)  Per Care Everywhere - Last seen per pulmonary [facility] April 2019 with PFT's and conclusion:  Mod copd, exsmoker, increased dyspnea with o2, 50 lbs overwt - ? Cardiac rather than pulmonary; to cont current inhalers, singulair, referred to cardiology and f/u appt at 4 mo (the latter 2 pt states he was unaware).  Now prefers cardiology locally intead of [place].   Has appt with urology next wk to f/u prostate ca. Also c/o ? Enlarging right post head mass x 1 mo, now 2 sites with one smaller one slightly tender but non draining and no fever Past Medical History:  Diagnosis Date  . ALLERGIC RHINITIS 10/04/2008  . ASTHMA 10/04/2008  . BACK PAIN 04/18/2008  . BENIGN PROSTATIC HYPERTROPHY 03/09/2007  . COLONIC POLYPS, HX OF 03/09/2007  . COPD 10/20/2007  . DIABETES MELLITUS, TYPE II 03/09/2007  . FATIGUE 10/05/2007  . GERD (gastroesophageal reflux disease) 03/01/2012  . GOUT 10/19/2006  . HYPERLIPIDEMIA 10/19/2006  . HYPERTENSION 10/19/2006  . OSA (obstructive sleep apnea) 07/19/2015  . Prostate cancer ([place]) 01/19/2012  . SWELLING MASS OR LUMP IN HEAD AND NECK 01/09/2010   Past Surgical History:  Procedure Laterality Date  .  APPENDECTOMY      reports that he has quit smoking. He has never used smokeless tobacco. He reports that he does not drink alcohol or use drugs. family history includes Cancer in an other family member; Hypertension in his mother. No Known Allergies Current Outpatient Medications on File Prior to Visit  Medication Sig Dispense Refill  . albuterol (PROVENTIL HFA;VENTOLIN HFA) 108 (90 BASE) MCG/ACT inhaler Inhale 2 puffs into the lungs 4 (four) times daily. 3 Inhaler 3  . allopurinol (ZYLOPRIM) 300 MG tablet TAKE 1 TABLET DAILY 90 tablet 4  . aspirin 81 MG tablet Take 81 mg by mouth daily.      . empagliflozin (JARDIANCE) 10 MG TABS tablet Take 10 mg by mouth daily. 90 tablet 3  . Fluticasone-Umeclidin-Vilant (TRELEGY ELLIPTA) 100-62.5-25 MCG/INH AEPB Inhale 1 puff into the lungs daily. 3 each 3  . FREESTYLE LITE test strip USE 1 STRIP AS NEEDED TO CHECK SUGAR TWICE A DAY 100 each 2  . glipiZIDE (GLUCOTROL XL) 10 MG 24 hr tablet TAKE 1 TABLET DAILY WITH BREAKFAST 90 tablet 1  . Lancets MISC Use asd 1 per day 250.02 100 each 11  . losartan-hydrochlorothiazide (HYZAAR) 50-12.5 MG tablet TAKE 1 TABLET DAILY 90 tablet 1  . metFORMIN (GLUCOPHAGE) 1000 MG tablet TAKE 1 TABLET(1000 MG) BY MOUTH TWICE DAILY WITH A MEAL 180 tablet 1  . montelukast (SINGULAIR) 10 MG tablet TAKE 1 TABLET (10MG  TOTAL ) BY MOUTH AT BEDTIME 90  tablet 2  . Omega-3 Fatty Acids (FISH OIL) 500 MG CAPS Take by mouth daily.      . tamsulosin (FLOMAX) 0.4 MG CAPS capsule Take 1 capsule (0.4 mg total) by mouth daily. 90 capsule 3  . triamcinolone (NASACORT AQ) 55 MCG/ACT AERO nasal inhaler Place 2 sprays into the nose daily. 1 Inhaler 12  . VIAGRA 100 MG tablet TAKE ONE-HALF (1/2) TO ONE TABLET (50 MG TO 100 MG) DAILY AS NEEDED FOR ERECTILE DYSFUNCTION 18 tablet 0  . cetirizine (ZYRTEC) 10 MG tablet Take 1 tablet (10 mg total) by mouth daily. 30 tablet 11  . esomeprazole (NEXIUM) 40 MG capsule Take 1 capsule (40 mg total) by mouth  daily. 90 capsule 3   No current facility-administered medications on file prior to visit.    Review of Systems  Constitutional: Negative for other unusual diaphoresis or sweats HENT: Negative for ear discharge or swelling Eyes: Negative for other worsening visual disturbances Respiratory: Negative for stridor or other swelling  Gastrointestinal: Negative for worsening distension or other blood Genitourinary: Negative for retention or other urinary change Musculoskeletal: Negative for other MSK pain or swelling Skin: Negative for color change or other new lesions Neurological: Negative for worsening tremors and other numbness  Psychiatric/Behavioral: Negative for worsening agitation or other fatigue All other system neg per pt    Objective:   Physical Exam BP 128/76   Pulse (!) 102   Temp 97.8 F (36.6 C) (Oral)   Ht 4\' 7"  (1.397 m)   Wt 197 lb (89.4 kg)   SpO2 96%   BMI 45.79 kg/m  VS noted,  Constitutional: Pt appears in NAD HENT: Head: NCAT.  Right Ear: External ear normal.  Left Ear: External ear normal.  Eyes: . Pupils are equal, round, and reactive to light. Conjunctivae and EOM are normal Nose: without d/c or deformity Neck: Neck supple. Gross normal ROM Cardiovascular: Normal rate and regular rhythm.   Pulmonary/Chest: Effort normal and breath sounds without rales or wheezing.  Abd:  Soft, NT, ND, + BS, no organomegaly Neurological: Pt is alert. At baseline orientation, motor grossly intact Skin: Skin is warm. No rash, no LE edema, right occipital area with one large possibly lipomatous mass about 2.5 cm post auricular, and one smaller lesion aprox 1 cm just superomedial right occiput area Psychiatric: Pt behavior is normal without agitation  No other exam findings Lab Results  Component Value Date   WBC 4.5 11/17/2017   HGB 12.8 (L) 11/17/2017   HCT 39.6 11/17/2017   PLT 211.0 11/17/2017   GLUCOSE 105 (H) 11/17/2017   CHOL 145 11/17/2017   TRIG 49.0  11/17/2017   HDL 49.40 11/17/2017   LDLCALC 86 11/17/2017   ALT 21 11/17/2017   AST 18 11/17/2017   NA 141 11/17/2017   K 4.4 11/17/2017   CL 103 11/17/2017   CREATININE 1.09 11/17/2017   BUN 34 (H) 11/17/2017   CO2 31 11/17/2017   TSH 1.45 11/17/2017   PSA 5.03 (H) 11/17/2017   HGBA1C 8.0 (H) 11/17/2017   MICROALBUR <0.7 11/17/2017      Assessment & Plan:

## 2018-05-17 NOTE — Telephone Encounter (Signed)
Pt has viewed results via MyChart  

## 2018-05-20 DIAGNOSIS — R0602 Shortness of breath: Secondary | ICD-10-CM | POA: Diagnosis not present

## 2018-05-20 DIAGNOSIS — J439 Emphysema, unspecified: Secondary | ICD-10-CM | POA: Diagnosis not present

## 2018-05-20 DIAGNOSIS — G4733 Obstructive sleep apnea (adult) (pediatric): Secondary | ICD-10-CM | POA: Diagnosis not present

## 2018-05-23 ENCOUNTER — Ambulatory Visit (HOSPITAL_COMMUNITY): Payer: Medicare Other | Attending: Internal Medicine

## 2018-05-23 DIAGNOSIS — R0600 Dyspnea, unspecified: Secondary | ICD-10-CM

## 2018-05-23 DIAGNOSIS — R0609 Other forms of dyspnea: Secondary | ICD-10-CM | POA: Insufficient documentation

## 2018-05-25 ENCOUNTER — Other Ambulatory Visit (INDEPENDENT_AMBULATORY_CARE_PROVIDER_SITE_OTHER): Payer: Medicare Other

## 2018-05-25 DIAGNOSIS — N179 Acute kidney failure, unspecified: Secondary | ICD-10-CM | POA: Diagnosis not present

## 2018-05-25 LAB — BASIC METABOLIC PANEL
BUN: 40 mg/dL — ABNORMAL HIGH (ref 6–23)
CO2: 31 mEq/L (ref 19–32)
Calcium: 9.7 mg/dL (ref 8.4–10.5)
Chloride: 102 mEq/L (ref 96–112)
Creatinine, Ser: 1.51 mg/dL — ABNORMAL HIGH (ref 0.40–1.50)
GFR: 54.12 mL/min — ABNORMAL LOW (ref >60.00)
Glucose, Bld: 160 mg/dL — ABNORMAL HIGH (ref 70–99)
Potassium: 4.4 mEq/L (ref 3.5–5.1)
Sodium: 140 mEq/L (ref 135–145)

## 2018-05-27 ENCOUNTER — Other Ambulatory Visit: Payer: Self-pay | Admitting: Internal Medicine

## 2018-05-27 DIAGNOSIS — N183 Chronic kidney disease, stage 3 unspecified: Secondary | ICD-10-CM

## 2018-05-30 ENCOUNTER — Telehealth: Payer: Self-pay

## 2018-05-30 NOTE — Telephone Encounter (Signed)
-----   Message from [name], MD sent at 05/27/2018  4:57 PM EST ----- Left message on MyChart, pt to cont same tx except  The test results show that your current treatment is OK, as the kidney function is improved, but still quite a bit worse than the usual before.  We should refer you to Nephrology (renal) for further consideration    Minsa Weddington to please inform pt, I will do referral

## 2018-05-30 NOTE — Telephone Encounter (Signed)
Pt has viewed results via MyChart  

## 2018-06-01 ENCOUNTER — Other Ambulatory Visit: Payer: Self-pay | Admitting: Specialist

## 2018-06-01 DIAGNOSIS — R0602 Shortness of breath: Secondary | ICD-10-CM

## 2018-06-07 ENCOUNTER — Other Ambulatory Visit: Payer: Self-pay

## 2018-06-07 ENCOUNTER — Ambulatory Visit
Admission: RE | Admit: 2018-06-07 | Discharge: 2018-06-07 | Disposition: A | Discharge status: Home / Self Care | Payer: Medicare Other | Source: Ambulatory Visit | Attending: Specialist | Admitting: Specialist

## 2018-06-07 DIAGNOSIS — R0602 Shortness of breath: Secondary | ICD-10-CM | POA: Diagnosis not present

## 2018-06-07 DIAGNOSIS — J432 Centrilobular emphysema: Secondary | ICD-10-CM | POA: Diagnosis not present

## 2018-06-07 MED ORDER — IOHEXOL 300 MG/ML  SOLN
75.0000 mL | Freq: Once | INTRAMUSCULAR | Status: AC | PRN
  Administered 2018-06-07: 75 mL via INTRAVENOUS

## 2018-06-08 ENCOUNTER — Telehealth: Payer: Self-pay

## 2018-06-08 NOTE — Telephone Encounter (Signed)
Left message for patient to call back regarding appointment with Dr. Nahser on 3/23. Call placed due to restrictions enacted for Covid 19.   

## 2018-06-08 NOTE — Telephone Encounter (Signed)
Copied from [place] [phone]. Topic: General - Other >> Jun 08, 2018  2:14 PM Virl Axe D wrote: Reason for CRM: Pt stated that Dr, Jenny Reichmann scheduled him an appointment with Dr. Janace Hoard for today. Dr. Janace Hoard called pt and told him based on the Covid 19 he felt that the pt did not need to be seen at this time and they will schedule him for a later date. Please advise.

## 2018-06-13 ENCOUNTER — Ambulatory Visit: Payer: Medicare Other | Admitting: Cardiovascular Disease

## 2018-06-14 ENCOUNTER — Other Ambulatory Visit: Payer: Self-pay | Admitting: Internal Medicine

## 2018-06-14 DIAGNOSIS — E119 Type 2 diabetes mellitus without complications: Secondary | ICD-10-CM

## 2018-06-24 ENCOUNTER — Encounter: Payer: Self-pay | Admitting: Gastroenterology

## 2018-06-25 ENCOUNTER — Other Ambulatory Visit: Payer: Self-pay | Admitting: Internal Medicine

## 2018-06-29 ENCOUNTER — Telehealth: Payer: Self-pay | Admitting: Physician Assistant

## 2018-06-29 NOTE — Telephone Encounter (Signed)
   TELEPHONE CALL NOTE  This patient has been deemed a candidate for tele-health visit to limit community exposure during the Covid-19 pandemic. I spoke with the patient via phone to discuss instructions. He is interested in moving his next month appointment with Dr. Acie Fredrickson as virtual visit. I have sent (dotphrase: [email or web address])  via MyChart. The patient was advised to review the section on consent for treatment as well.   Please Schedule  Virtual Office Visit for Dyspnea (New patient) with Dr. Acie Fredrickson as "VIDEO" per patient's preference.  I have either confirmed the patient is active in MyChart or offered to send sign-up link to phone/email via Mychart icon beside patient's photo.  [place], [place] 06/29/2018 2:58 PM

## 2018-07-08 DIAGNOSIS — C61 Malignant neoplasm of prostate: Secondary | ICD-10-CM | POA: Diagnosis not present

## 2018-07-12 ENCOUNTER — Telehealth: Payer: Self-pay | Admitting: Cardiovascular Disease

## 2018-07-12 NOTE — Telephone Encounter (Signed)
Spoke with patient who confirmed all demographics.  He will have vitals ready for visit.  He is active on My Chart. Has smart phone.

## 2018-07-15 DIAGNOSIS — C61 Malignant neoplasm of prostate: Secondary | ICD-10-CM | POA: Diagnosis not present

## 2018-07-15 DIAGNOSIS — N401 Enlarged prostate with lower urinary tract symptoms: Secondary | ICD-10-CM | POA: Diagnosis not present

## 2018-07-15 DIAGNOSIS — R3912 Poor urinary stream: Secondary | ICD-10-CM | POA: Diagnosis not present

## 2018-07-18 ENCOUNTER — Encounter: Payer: Self-pay | Admitting: Cardiovascular Disease

## 2018-07-18 ENCOUNTER — Other Ambulatory Visit: Payer: Self-pay

## 2018-07-18 ENCOUNTER — Telehealth: Payer: Self-pay | Admitting: Cardiovascular Disease

## 2018-07-18 ENCOUNTER — Telehealth (INDEPENDENT_AMBULATORY_CARE_PROVIDER_SITE_OTHER): Payer: Medicare Other | Admitting: Cardiovascular Disease

## 2018-07-18 VITALS — BP 131/70 | Ht 65.0 in | Wt 194.0 lb

## 2018-07-18 DIAGNOSIS — I10 Essential (primary) hypertension: Secondary | ICD-10-CM

## 2018-07-18 DIAGNOSIS — R0609 Other forms of dyspnea: Secondary | ICD-10-CM

## 2018-07-18 DIAGNOSIS — R0600 Dyspnea, unspecified: Secondary | ICD-10-CM

## 2018-07-18 DIAGNOSIS — Z7189 Other specified counseling: Secondary | ICD-10-CM

## 2018-07-18 NOTE — Progress Notes (Signed)
Virtual Visit via Video Note   This visit type was conducted due to national recommendations for restrictions regarding the COVID-19 Pandemic (e.g. social distancing) in an effort to limit this patient's exposure and mitigate transmission in our community.  Due to his co-morbid illnesses, this patient is at least at moderate risk for complications without adequate follow up.  This format is felt to be most appropriate for this patient at this time.  All issues noted in this document were discussed and addressed.  A limited physical exam was performed with this format.  Please refer to the patient's chart for his consent to telehealth for [name].   Evaluation Performed:  Follow-up visit  Date:  07/18/2018   ID:  Allen Hunter., DOB 1941/01/09, MRN [id number]  Patient Location: Home Provider Location: Office  PCP:  [name], MD  Cardiologist:   Antoniette Peake  Electrophysiologist:  None   Chief Complaint:  Dyspnea on exertion   History of Present Illness:    Allen Hunter. is a 78 y.o. male with A history of shortness of breath with exertion.  Seen today via video chat for the first time.  He has a history of obstructive sleep apnea.  He has a history of moderate COPD.  He has not been wearing his CPAP.  He has been seen by the pulmonary department at [facility]. Primary is Cathlean Cower, MD   Several months ago, noticed he was very short of breath. Had to stop going to the gym. DOE walking through the street.  CT of the chest  Had mild coronary calcifications  Has noticed a significant reduction in his ability to exercise 8 months ago   Denies any chest tightnss or pressure with exertion    No dizziness, no sweats,   No blood in stool or black tarry stool  No hx of heart murmur.  No NVD, Has gained weight over the past 8 months  15 lbs  BP has been great for the past several years Has been eating more canned foods and lots of salty , preserved meats ,  (  [place] sausage,  Spam, bologna, package ham,  [place],  Hot dogs )     The patient does not have symptoms concerning for COVID-19 infection (fever, chills, cough, or new shortness of breath).  Normally coughs  No aches, no  Fever.    Past Medical History:  Diagnosis Date  . ALLERGIC RHINITIS 10/04/2008  . ASTHMA 10/04/2008  . BACK PAIN 04/18/2008  . BENIGN PROSTATIC HYPERTROPHY 03/09/2007  . COLONIC POLYPS, HX OF 03/09/2007  . COPD 10/20/2007  . DIABETES MELLITUS, TYPE II 03/09/2007  . FATIGUE 10/05/2007  . GERD (gastroesophageal reflux disease) 03/01/2012  . GOUT 10/19/2006  . HYPERLIPIDEMIA 10/19/2006  . HYPERTENSION 10/19/2006  . OSA (obstructive sleep apnea) 07/19/2015  . Prostate cancer ([place]) 01/19/2012  . SWELLING MASS OR LUMP IN HEAD AND NECK 01/09/2010   Past Surgical History:  Procedure Laterality Date  . APPENDECTOMY       Current Meds  Medication Sig  . allopurinol (ZYLOPRIM) 300 MG tablet TAKE 1 TABLET DAILY  . aspirin 81 MG tablet Take 81 mg by mouth daily.    Marland Kitchen atorvastatin (LIPITOR) 40 MG tablet Take 1 tablet (40 mg total) by mouth daily.  . Continuous Blood Gluc Receiver (FREESTYLE LIBRE 14 DAY READER) DEVI Apply 1 Device topically daily. E11.9  . Continuous Blood Gluc Sensor (FREESTYLE LIBRE 14 DAY SENSOR) MISC Apply 1  Device topically every 14 (fourteen) days.  . empagliflozin (JARDIANCE) 10 MG TABS tablet Take 10 mg by mouth daily.  Marland Kitchen esomeprazole (NEXIUM) 40 MG capsule Take 40 mg by mouth daily at 12 noon.  . Fluticasone-Umeclidin-Vilant (TRELEGY ELLIPTA) 100-62.5-25 MCG/INH AEPB Inhale 1 puff into the lungs daily.  Marland Kitchen FREESTYLE LITE test strip USE 1 STRIP AS NEEDED TO CHECK SUGAR TWICE A DAY  . glipiZIDE (GLUCOTROL XL) 10 MG 24 hr tablet TAKE 1 TABLET DAILY WITH BREAKFAST  . Lancets MISC Use asd 1 per day 250.02  . losartan-hydrochlorothiazide (HYZAAR) 50-12.5 MG tablet TAKE 1 TABLET DAILY  . metFORMIN (GLUCOPHAGE) 1000 MG tablet TAKE 1 TABLET TWICE A DAY WITH  A MEAL  . montelukast (SINGULAIR) 10 MG tablet TAKE 1 TABLET (10MG  TOTAL ) BY MOUTH AT BEDTIME  . Omega-3 Fatty Acids (FISH OIL) 500 MG CAPS Take by mouth daily.    . tamsulosin (FLOMAX) 0.4 MG CAPS capsule Take 1 capsule (0.4 mg total) by mouth daily.  Marland Kitchen VIAGRA 100 MG tablet TAKE ONE-HALF (1/2) TO ONE TABLET (50 MG TO 100 MG) DAILY AS NEEDED FOR ERECTILE DYSFUNCTION     Allergies:   Patient has no known allergies.   Social History   Tobacco Use  . Smoking status: Former Research scientist (life sciences)  . Smokeless tobacco: Never Used  Substance Use Topics  . Alcohol use: No  . Drug use: Never     Family Hx: The patient's family history includes Cancer in an other family member; Hypertension in his mother.  ROS:   Please see the history of present illness.     All other systems reviewed and are negative.   Prior CV studies:   The following studies were reviewed today:    Labs/Other Tests and Data Reviewed:    EKG:  No ECG reviewed.  Recent Labs: 11/17/2017: Hemoglobin 12.8; Platelets 211.0; TSH 1.45 05/17/2018: ALT 21 05/25/2018: BUN 40; Creatinine, Ser 1.51; Potassium 4.4; Sodium 140   Recent Lipid Panel Lab Results  Component Value Date/Time   CHOL 192 05/17/2018 09:45 AM   TRIG 117.0 05/17/2018 09:45 AM   TRIG 49 03/30/2006 10:21 AM   HDL 53.70 05/17/2018 09:45 AM   CHOLHDL 4 05/17/2018 09:45 AM   LDLCALC 115 (H) 05/17/2018 09:45 AM    Wt Readings from Last 3 Encounters:  07/18/18 194 lb (88 kg)  05/17/18 197 lb (89.4 kg)  12/02/17 200 lb (90.7 kg)     Objective:    Vital Signs:  BP 131/70 (BP Location: Left Arm, Patient Position: Sitting, Cuff Size: Normal)   Ht 5\' 5"  (1.651 m)   Wt 194 lb (88 kg)   BMI 32.28 kg/m    VITAL SIGNS:  reviewed GEN:  no acute distress EYES:  sclerae anicteric, EOMI - Extraocular Movements Intact RESPIRATORY:  normal respiratory effort, symmetric expansion CARDIOVASCULAR:  no peripheral edema SKIN:  no rash, lesions or ulcers.  MUSCULOSKELETAL:  no obvious deformities. NEURO:  alert and oriented x 3, no obvious focal deficit PSYCH:  normal affect  ASSESSMENT & PLAN:    1. DOE:   He eats a very salty diet.   Ive recommended that he greatly reduce the salt in his diet BP has remained normal.   He will reduce his salt intake and send [place] a message via mychart in a month If he is not feeling better will get an echo  Office visit in 3 months    COVID-19 Education: The signs and symptoms of COVID-19 were  discussed with the patient and how to seek care for testing (follow up with PCP or arrange E-visit).  The importance of social distancing was discussed today.  Time:   Today, I have spent  25  minutes with the patient with telehealth technology discussing the above problems.    additiona 12 minutes of precharting / charting    Medication Adjustments/Labs and Tests Ordered: Current medicines are reviewed at length with the patient today.  Concerns regarding medicines are outlined above.   Tests Ordered: No orders of the defined types were placed in this encounter.   Medication Changes: No orders of the defined types were placed in this encounter.   Disposition:  Follow up in 3 month(s)  Signed, Mertie Moores, MD  07/18/2018 10:52 AM    [facility]

## 2018-07-18 NOTE — Telephone Encounter (Signed)
New Message   Patient appointment is suppose to be right now would like a nurse to call him back.

## 2018-07-18 NOTE — Patient Instructions (Addendum)
Medication Instructions:  Your physician recommends that you continue on your current medications as directed. Please refer to the Current Medication list given to you today.  If you need a refill on your cardiac medications before your next appointment, please call your pharmacy.   Lab work: None Ordered  If you have labs (blood work) drawn today and your tests are completely normal, you will receive your results only by: Marland Kitchen MyChart Message (if you have MyChart) OR . A paper copy in the mail If you have any lab test that is abnormal or we need to change your treatment, we will call you to review the results.  Testing/Procedures: None Ordered   Follow-Up: Your physician recommends that you return for a follow-up appointment on Thursday August 27 at 9:00 am with Dr. Acie Fredrickson    Low-Sodium Eating Plan Sodium, which is an element that makes up salt, helps you maintain a healthy balance of fluids in your body. Too much sodium can increase your blood pressure and cause fluid and waste to be held in your body. Your health care provider or dietitian may recommend following this plan if you have high blood pressure (hypertension), kidney disease, liver disease, or heart failure. Eating less sodium can help lower your blood pressure, reduce swelling, and protect your heart, liver, and kidneys. What are tips for following this plan? General guidelines  Most people on this plan should limit their sodium intake to 1,500-2,000 mg (milligrams) of sodium each day. Reading food labels   The Nutrition Facts label lists the amount of sodium in one serving of the food. If you eat more than one serving, you must multiply the listed amount of sodium by the number of servings.  Choose foods with less than 140 mg of sodium per serving.  Avoid foods with 300 mg of sodium or more per serving. Shopping  Look for lower-sodium products, often labeled as "low-sodium" or "no salt added."  Always check the  sodium content even if foods are labeled as "unsalted" or "no salt added".  Buy fresh foods. ? Avoid canned foods and premade or frozen meals. ? Avoid canned, cured, or processed meats  Buy breads that have less than 80 mg of sodium per slice. Cooking  Eat more home-cooked food and less restaurant, buffet, and fast food.  Avoid adding salt when cooking. Use salt-free seasonings or herbs instead of table salt or sea salt. Check with your health care provider or pharmacist before using salt substitutes.  Cook with plant-based oils, such as canola, sunflower, or olive oil. Meal planning  When eating at a restaurant, ask that your food be prepared with less salt or no salt, if possible.  Avoid foods that contain MSG (monosodium glutamate). MSG is sometimes added to [place] food, bouillon, and some canned foods. What foods are recommended? The items listed may not be a complete list. Talk with your dietitian about what dietary choices are best for you. Grains Low-sodium cereals, including oats, puffed wheat and rice, and shredded wheat. Low-sodium crackers. Unsalted rice. Unsalted pasta. Low-sodium bread. Whole-grain breads and whole-grain pasta. Vegetables Fresh or frozen vegetables. "No salt added" canned vegetables. "No salt added" tomato sauce and paste. Low-sodium or reduced-sodium tomato and vegetable juice. Fruits Fresh, frozen, or canned fruit. Fruit juice. Meats and other protein foods Fresh or frozen (no salt added) meat, poultry, seafood, and fish. Low-sodium canned tuna and salmon. Unsalted nuts. Dried peas, beans, and lentils without added salt. Unsalted canned beans. Eggs. Unsalted nut butters.  Dairy Milk. Soy milk. Cheese that is naturally low in sodium, such as ricotta cheese, fresh mozzarella, or Swiss cheese Low-sodium or reduced-sodium cheese. Cream cheese. Yogurt. Fats and oils Unsalted butter. Unsalted margarine with no trans fat. Vegetable oils such as canola or olive  oils. Seasonings and other foods Fresh and dried herbs and spices. Salt-free seasonings. Low-sodium mustard and ketchup. Sodium-free salad dressing. Sodium-free light mayonnaise. Fresh or refrigerated horseradish. Lemon juice. Vinegar. Homemade, reduced-sodium, or low-sodium soups. Unsalted popcorn and pretzels. Low-salt or salt-free chips. What foods are not recommended? The items listed may not be a complete list. Talk with your dietitian about what dietary choices are best for you. Grains Instant hot cereals. Bread stuffing, pancake, and biscuit mixes. Croutons. Seasoned rice or pasta mixes. Noodle soup cups. Boxed or frozen macaroni and cheese. Regular salted crackers. Self-rising flour. Vegetables Sauerkraut, pickled vegetables, and relishes. Olives. [place] fries. Onion rings. Regular canned vegetables (not low-sodium or reduced-sodium). Regular canned tomato sauce and paste (not low-sodium or reduced-sodium). Regular tomato and vegetable juice (not low-sodium or reduced-sodium). Frozen vegetables in sauces. Meats and other protein foods Meat or fish that is salted, canned, smoked, spiced, or pickled. Bacon, ham, sausage, hotdogs, corned beef, chipped beef, packaged lunch meats, salt pork, jerky, pickled herring, anchovies, regular canned tuna, sardines, salted nuts. Dairy Processed cheese and cheese spreads. Cheese curds. Blue cheese. Feta cheese. String cheese. Regular cottage cheese. Buttermilk. Canned milk. Fats and oils Salted butter. Regular margarine. Ghee. Bacon fat. Seasonings and other foods Onion salt, garlic salt, seasoned salt, table salt, and sea salt. Canned and packaged gravies. Worcestershire sauce. Tartar sauce. Barbecue sauce. Teriyaki sauce. Soy sauce, including reduced-sodium. Steak sauce. Fish sauce. Oyster sauce. Cocktail sauce. Horseradish that you find on the shelf. Regular ketchup and mustard. Meat flavorings and tenderizers. Bouillon cubes. Hot sauce and Tabasco sauce.  Premade or packaged marinades. Premade or packaged taco seasonings. Relishes. Regular salad dressings. Salsa. Potato and tortilla chips. Corn chips and puffs. Salted popcorn and pretzels. Canned or dried soups. Pizza. Frozen entrees and pot pies. Summary  Eating less sodium can help lower your blood pressure, reduce swelling, and protect your heart, liver, and kidneys.  Most people on this plan should limit their sodium intake to 1,500-2,000 mg (milligrams) of sodium each day.  Canned, boxed, and frozen foods are high in sodium. Restaurant foods, fast foods, and pizza are also very high in sodium. You also get sodium by adding salt to food.  Try to cook at home, eat more fresh fruits and vegetables, and eat less fast food, canned, processed, or prepared foods. This information is not intended to replace advice given to you by your health care provider. Make sure you discuss any questions you have with your health care provider. Document Released: 08/29/2001 Document Revised: 03/02/2016 Document Reviewed: 03/02/2016 Elsevier Interactive Patient Education  2019 [employer].

## 2018-07-18 NOTE — Telephone Encounter (Signed)
Attempted to call pt 4 times this morning for his appt. Pt never answered the phone and a message was left.

## 2018-07-21 MED ORDER — LIDOCAINE-EPINEPHRINE (PF) 2 %-1:200000 IJ SOLN
INTRAMUSCULAR | Status: AC
  Filled 2018-07-21: qty 10

## 2018-07-21 MED ORDER — MORPHINE SULFATE (PF) 0.5 MG/ML IJ SOLN
INTRAMUSCULAR | Status: AC
  Filled 2018-07-21: qty 10

## 2018-07-21 MED ORDER — PHENYLEPHRINE 40 MCG/ML (10ML) SYRINGE FOR IV PUSH (FOR BLOOD PRESSURE SUPPORT)
PREFILLED_SYRINGE | INTRAVENOUS | Status: AC
  Filled 2018-07-21: qty 10

## 2018-07-21 MED ORDER — FENTANYL CITRATE (PF) 100 MCG/2ML IJ SOLN
INTRAMUSCULAR | Status: AC
  Filled 2018-07-21: qty 2

## 2018-07-31 ENCOUNTER — Other Ambulatory Visit: Payer: Self-pay | Admitting: Internal Medicine

## 2018-08-01 NOTE — Progress Notes (Addendum)
Subjective:   Allen Gloster. is a 78 y.o. male who presents for Medicare Annual/Subsequent preventive examination.  I connected with patient 08/02/18 at  9:00 AM EDT by a video/audio enabled telemedicine application and verified that I am speaking with the correct person using two identifiers. Patient stated full name and DOB. Patient gave permission to continue with virtual visit. Patient's location was at home and Nurse's location was at [place] office.   Review of Systems:  No ROS.  Medicare Wellness Virtual Visit.  Visual/audio telehealth visit, UTA vital signs.   See social history for additional risk factors.  Cardiac Risk Factors include: advanced age (>33men, >49 women);diabetes mellitus;dyslipidemia;male gender;hypertension  Sleep patterns: feels rested on waking, gets up 2-3 times nightly to void and sleeps 7 hours nightly.   Home Safety/Smoke Alarms: Feels safe in home. Smoke alarms in place.  Living environment; residence and Firearm Safety: 1-story house/ trailer. Lives alone, no needs for DME, good support system Seat Belt Safety/Bike Helmet: Wears seat belt.     Objective:    Vitals: There were no vitals taken for this visit.  There is no height or weight on file to calculate BMI.  Advanced Directives 08/02/2018 12/02/2017 07/22/2017 07/17/2016  Does Patient Have a Medical Advance Directive? No No No No  Would patient like information on creating a medical advance directive? Yes (ED - Information included in AVS) - Yes (ED - Information included in AVS) Yes (ED - Information included in AVS)    Tobacco Social History   Tobacco Use  Smoking Status Former Smoker  Smokeless Tobacco Never Used     Counseling given: Not Answered  Past Medical History:  Diagnosis Date   ALLERGIC RHINITIS 10/04/2008   ASTHMA 10/04/2008   BACK PAIN 04/18/2008   BENIGN PROSTATIC HYPERTROPHY 03/09/2007   COLONIC POLYPS, HX OF 03/09/2007   COPD 10/20/2007   DIABETES MELLITUS, TYPE  II 03/09/2007   FATIGUE 10/05/2007   GERD (gastroesophageal reflux disease) 03/01/2012   GOUT 10/19/2006   HYPERLIPIDEMIA 10/19/2006   HYPERTENSION 10/19/2006   OSA (obstructive sleep apnea) 07/19/2015   Prostate cancer ([place]) 01/19/2012   SWELLING MASS OR LUMP IN HEAD AND NECK 01/09/2010   Past Surgical History:  Procedure Laterality Date   APPENDECTOMY     Family History  Problem Relation Age of Onset   Hypertension Mother    Cancer Other    Social History   Socioeconomic History   Marital status: Single    Spouse name: Not on file   Number of children: Not on file   Years of education: Not on file   Highest education level: Not on file  Occupational History   Occupation: retired [employer] aug 2009  Social Needs   Financial resource strain: Not hard at all   Food insecurity:    Worry: Never true    Inability: Never true   Transportation needs:    Medical: No    Non-medical: No  Tobacco Use   Smoking status: Former Smoker   Smokeless tobacco: Never Used  Substance and Sexual Activity   Alcohol use: No   Drug use: Never   Sexual activity: Not Currently  Lifestyle   Physical activity:    Days per week: 3 days    Minutes per session: 40 min   Stress: Not at all  Relationships   Social connections:    Talks on phone: Once a week    Gets together: Once a week  Attends religious service: 1 to 4 times per year    Active member of club or organization: No    Attends meetings of clubs or organizations: Never    Relationship status: Not on file  Other Topics Concern   Not on file  Social History Narrative   Not on file    Outpatient Encounter Medications as of 08/02/2018  Medication Sig   allopurinol (ZYLOPRIM) 300 MG tablet TAKE 1 TABLET DAILY   aspirin 81 MG tablet Take 81 mg by mouth daily.     atorvastatin (LIPITOR) 40 MG tablet Take 1 tablet (40 mg total) by mouth daily.   Continuous Blood Gluc Receiver (FREESTYLE LIBRE  14 DAY READER) DEVI Apply 1 Device topically daily. E11.9   empagliflozin (JARDIANCE) 10 MG TABS tablet Take 10 mg by mouth daily.   esomeprazole (NEXIUM) 40 MG capsule Take 40 mg by mouth daily at 12 noon.   fluticasone (FLONASE) 50 MCG/ACT nasal spray Place 1 spray into both nostrils daily.   Fluticasone-Umeclidin-Vilant (TRELEGY ELLIPTA) 100-62.5-25 MCG/INH AEPB Inhale 1 puff into the lungs daily.   FREESTYLE LITE test strip USE 1 STRIP AS NEEDED TO CHECK SUGAR TWICE A DAY   glipiZIDE (GLUCOTROL XL) 10 MG 24 hr tablet TAKE 1 TABLET DAILY WITH BREAKFAST   ibuprofen (ADVIL) 200 MG tablet Take 200 mg by mouth every 6 (six) hours as needed.   Lancets MISC Use asd 1 per day 250.02   losartan-hydrochlorothiazide (HYZAAR) 50-12.5 MG tablet TAKE 1 TABLET BY MOUTH EVERY DAY   metFORMIN (GLUCOPHAGE) 1000 MG tablet TAKE 1 TABLET TWICE A DAY WITH A MEAL   montelukast (SINGULAIR) 10 MG tablet TAKE 1 TABLET (10MG  TOTAL ) BY MOUTH AT BEDTIME   Multiple Vitamins-Minerals (CENTRUM MEN PO) Take 1 tablet by mouth daily.   Omega-3 Fatty Acids (FISH OIL) 500 MG CAPS Take by mouth daily.     tamsulosin (FLOMAX) 0.4 MG CAPS capsule Take 1 capsule (0.4 mg total) by mouth daily.   VIAGRA 100 MG tablet TAKE ONE-HALF (1/2) TO ONE TABLET (50 MG TO 100 MG) DAILY AS NEEDED FOR ERECTILE DYSFUNCTION   vitamin B-12 (CYANOCOBALAMIN) 1000 MCG tablet Take 1,000 mcg by mouth daily.   [DISCONTINUED] Continuous Blood Gluc Sensor (FREESTYLE LIBRE 14 DAY SENSOR) MISC Apply 1 Device topically every 14 (fourteen) days. (Patient not taking: Reported on 08/02/2018)   No facility-administered encounter medications on file as of 08/02/2018.     Activities of Daily Living In your present state of health, do you have any difficulty performing the following activities: 08/02/2018  Hearing? N  Vision? N  Difficulty concentrating or making decisions? N  Walking or climbing stairs? N  Dressing or bathing? N  Doing errands,  shopping? N  Preparing Food and eating ? N  Using the Toilet? N  In the past six months, have you accidently leaked urine? N  Do you have problems with loss of bowel control? N  Managing your Medications? N  Managing your Finances? N  Housekeeping or managing your Housekeeping? N  Some recent data might be hidden    Patient Care Team: [name], MD as PCP - General   Assessment:   This is a routine wellness examination for Allen Hunter. Physical assessment deferred to PCP.  Exercise Activities and Dietary recommendations Current Exercise Habits: Home exercise routine, Type of exercise: walking, Time (Minutes): 35, Frequency (Times/Week): 3, Weekly Exercise (Minutes/Week): 105, Intensity: Mild, Exercise limited by: Other - see comments  Diet (meal preparation,  eat out, water intake, caffeinated beverages, dairy products, fruits and vegetables): in general, a "healthy" diet     Reviewed heart healthy and diabetic diet. Encouraged patient to increase daily water and healthy fluid intake.  Goals     lose 10 pounds     Continue to exercise,  eat healthy diet and Increase water intake.       Fall Risk Fall Risk  08/02/2018 07/22/2017 05/18/2017 07/17/2016 07/19/2015  Falls in the past year? 0 No No No No  Number falls in past yr: 0 - - - -  Injury with Fall? 0 - - - -   Depression Screen PHQ 2/9 Scores 08/02/2018 11/17/2017 07/22/2017 05/18/2017  PHQ - 2 Score 0 0 1 0  PHQ- 9 Score - - 1 -    Cognitive Function MMSE - Mini Mental State Exam 07/22/2017  Orientation to time 5  Orientation to Place 5  Registration 3  Attention/ Calculation 3  Recall 3  Language- name 2 objects 2  Language- repeat 1  Language- follow 3 step command 3  Language- read & follow direction 1  Write a sentence 1  Copy design 1  Total score 28       Ad8 score reviewed for issues:  Issues making decisions: no  Less interest in hobbies / activities: no  Repeats questions, stories (family complaining):  no  Trouble using ordinary gadgets (microwave, computer, phone):no  Forgets the month or year: no  Mismanaging finances: no  Remembering appts: no  Daily problems with thinking and/or memory: no Ad8 score is= 0  Immunization History  Administered Date(s) Administered   H1N1 04/02/2008   Influenza Whole 03/09/2007, 12/27/2007, 01/21/2009   Influenza, High Dose Seasonal PF 11/18/2016   Influenza, Seasonal, Injecte, Preservative Fre 02/02/2013   Influenza,inj,Quad PF,6+ Mos 01/18/2015   Influenza-Unspecified 01/15/2016   Pneumococcal Conjugate-13 04/04/2013   Pneumococcal Polysaccharide-23 03/09/2007   Td 04/02/2008   Tdap 05/17/2018   Zoster 03/07/2013   Screening Tests Health Maintenance  Topic Date Due   OPHTHALMOLOGY EXAM  08/19/2018   INFLUENZA VACCINE  10/22/2018   FOOT EXAM  05/18/2019   TETANUS/TDAP  05/17/2028   PNA vac Low Risk Adult  Completed       Plan:     I have personally reviewed and noted the following in the patients chart:    Medical and social history  Use of alcohol, tobacco or illicit drugs   Current medications and supplements  Functional ability and status  Nutritional status  Physical activity  Advanced directives  List of other physicians  Screenings to include cognitive, depression, and falls  Referrals and appointments  In addition, I have reviewed and discussed with patient certain preventive protocols, quality metrics, and best practice recommendations. A written personalized care plan for preventive services as well as general preventive health recommendations were provided to patient.     Michiel Cowboy, RN  08/02/2018  Medical screening examination/treatment/procedure(s) were performed by non-physician practitioner and as supervising physician I was immediately available for consultation/collaboration. I agree with above. Cathlean Cower, MD

## 2018-08-02 ENCOUNTER — Ambulatory Visit (INDEPENDENT_AMBULATORY_CARE_PROVIDER_SITE_OTHER): Payer: Medicare Other | Admitting: *Deleted

## 2018-08-02 DIAGNOSIS — Z0000 Encounter for general adult medical examination without abnormal findings: Secondary | ICD-10-CM | POA: Diagnosis not present

## 2018-08-02 NOTE — Patient Instructions (Addendum)
Continue doing brain stimulating activities (puzzles, reading, adult coloring books, staying active) to keep memory sharp.   Continue to eat heart healthy diet (full of fruits, vegetables, whole grains, lean protein, water--limit salt, fat, and sugar intake) and increase physical activity as tolerated.  The [facility] is available Monday through Friday, 9:00 a.m. - 5:00 p.m. Call Center Specialists provide information and referral services to aging adults 65+ in [place]. If there are waiting lists for community services, or if services are not available, [facility] connects clients with [facility] volunteers, or other caring individuals in the community who provide services such as respite care, wheelchair ramp construction, friendly visits, or transportation assistance.  [facility] Specialists consider it an honor and privilege to pray with callers. Contact the Call Center at [phone] or email [email or web address].  [name] , Thank you for taking time to come for your Medicare Wellness Visit. I appreciate your ongoing commitment to your health goals. Please review the following plan we discussed and let me know if I can assist you in the future.   These are the goals we discussed: Goals    . lose 10 pounds     Continue to exercise,  eat healthy diet and Increase water intake.       This is a list of the screening recommended for you and due dates:  Health Maintenance  Topic Date Due  . Eye exam for diabetics  08/19/2018  . Flu Shot  10/22/2018  . Complete foot exam   05/18/2019  . Tetanus Vaccine  05/17/2028  . Pneumonia vaccines  Completed    Preventive Care 69 Years and Older, Male Preventive care refers to lifestyle choices and visits with your health care provider that can promote health and wellness. What does preventive care include?   A yearly physical exam. This is also called an annual well check.  Dental exams once or twice a year.  Routine eye exams. Ask your  health care provider how often you should have your eyes checked.  Personal lifestyle choices, including: ? Daily care of your teeth and gums. ? Regular physical activity. ? Eating a healthy diet. ? Avoiding tobacco and drug use. ? Limiting alcohol use. ? Practicing safe sex. ? Taking low doses of aspirin every day. ? Taking vitamin and mineral supplements as recommended by your health care provider. What happens during an annual well check? The services and screenings done by your health care provider during your annual well check will depend on your age, overall health, lifestyle risk factors, and family history of disease. Counseling Your health care provider may ask you questions about your:  Alcohol use.  Tobacco use.  Drug use.  Emotional well-being.  Home and relationship well-being.  Sexual activity.  Eating habits.  History of falls.  Memory and ability to understand (cognition).  Work and work Statistician. Screening You may have the following tests or measurements:  Height, weight, and BMI.  Blood pressure.  Lipid and cholesterol levels. These may be checked every 5 years, or more frequently if you are over 71 years old.  Skin check.  Lung cancer screening. You may have this screening every year starting at age 70 if you have a 30-pack-year history of smoking and currently smoke or have quit within the past 15 years.  Colorectal cancer screening. All adults should have this screening starting at age 38 and continuing until age 23. You will have tests every 1-10 years, depending on your results and the  type of screening test. People at increased risk should start screening at an earlier age. Screening tests may include: ? Guaiac-based fecal occult blood testing. ? Fecal immunochemical test (FIT). ? Stool DNA test. ? Virtual colonoscopy. ? Sigmoidoscopy. During this test, a flexible tube with a tiny camera (sigmoidoscope) is used to examine your rectum and  lower colon. The sigmoidoscope is inserted through your anus into your rectum and lower colon. ? Colonoscopy. During this test, a long, thin, flexible tube with a tiny camera (colonoscope) is used to examine your entire colon and rectum.  Prostate cancer screening. Recommendations will vary depending on your family history and other risks.  Hepatitis C blood test.  Hepatitis B blood test.  Sexually transmitted disease (STD) testing.  Diabetes screening. This is done by checking your blood sugar (glucose) after you have not eaten for a while (fasting). You may have this done every 1-3 years.  Abdominal aortic aneurysm (AAA) screening. You may need this if you are a current or former smoker.  Osteoporosis. You may be screened starting at age 32 if you are at high risk. Talk with your health care provider about your test results, treatment options, and if necessary, the need for more tests. Vaccines Your health care provider may recommend certain vaccines, such as:  Influenza vaccine. This is recommended every year.  Tetanus, diphtheria, and acellular pertussis (Tdap, Td) vaccine. You may need a Td booster every 10 years.  Varicella vaccine. You may need this if you have not been vaccinated.  Zoster vaccine. You may need this after age 46.  Measles, mumps, and rubella (MMR) vaccine. You may need at least one dose of MMR if you were born in 1957 or later. You may also need a second dose.  Pneumococcal 13-valent conjugate (PCV13) vaccine. One dose is recommended after age 16.  Pneumococcal polysaccharide (PPSV23) vaccine. One dose is recommended after age 77.  Meningococcal vaccine. You may need this if you have certain conditions.  Hepatitis A vaccine. You may need this if you have certain conditions or if you travel or work in places where you may be exposed to hepatitis A.  Hepatitis B vaccine. You may need this if you have certain conditions or if you travel or work in places  where you may be exposed to hepatitis B.  Haemophilus influenzae type b (Hib) vaccine. You may need this if you have certain risk factors. Talk to your health care provider about which screenings and vaccines you need and how often you need them. This information is not intended to replace advice given to you by your health care provider. Make sure you discuss any questions you have with your health care provider. Document Released: 04/05/2015 Document Revised: 04/29/2017 Document Reviewed: 01/08/2015 Elsevier Interactive Patient Education  2019 [employer].

## 2018-08-09 DIAGNOSIS — N183 Chronic kidney disease, stage 3 (moderate): Secondary | ICD-10-CM | POA: Diagnosis not present

## 2018-08-09 DIAGNOSIS — C61 Malignant neoplasm of prostate: Secondary | ICD-10-CM | POA: Diagnosis not present

## 2018-08-09 DIAGNOSIS — E669 Obesity, unspecified: Secondary | ICD-10-CM | POA: Diagnosis not present

## 2018-08-09 DIAGNOSIS — I129 Hypertensive chronic kidney disease with stage 1 through stage 4 chronic kidney disease, or unspecified chronic kidney disease: Secondary | ICD-10-CM | POA: Diagnosis not present

## 2018-08-10 ENCOUNTER — Ambulatory Visit: Payer: Medicare Other

## 2018-08-11 ENCOUNTER — Other Ambulatory Visit: Payer: Self-pay | Admitting: Nephrology

## 2018-08-11 DIAGNOSIS — N183 Chronic kidney disease, stage 3 unspecified: Secondary | ICD-10-CM

## 2018-08-16 ENCOUNTER — Ambulatory Visit: Payer: Medicare Other | Admitting: Cardiovascular Disease

## 2018-08-25 ENCOUNTER — Ambulatory Visit
Admission: RE | Admit: 2018-08-25 | Discharge: 2018-08-25 | Disposition: A | Discharge status: Home / Self Care | Payer: Medicare Other | Source: Ambulatory Visit | Attending: Nephrology | Admitting: Nephrology

## 2018-08-25 DIAGNOSIS — N183 Chronic kidney disease, stage 3 unspecified: Secondary | ICD-10-CM

## 2018-08-25 DIAGNOSIS — N289 Disorder of kidney and ureter, unspecified: Secondary | ICD-10-CM | POA: Diagnosis not present

## 2018-08-26 DIAGNOSIS — H2513 Age-related nuclear cataract, bilateral: Secondary | ICD-10-CM | POA: Diagnosis not present

## 2018-08-26 DIAGNOSIS — E119 Type 2 diabetes mellitus without complications: Secondary | ICD-10-CM | POA: Diagnosis not present

## 2018-08-26 DIAGNOSIS — H524 Presbyopia: Secondary | ICD-10-CM | POA: Diagnosis not present

## 2018-08-26 DIAGNOSIS — E089 Diabetes mellitus due to underlying condition without complications: Secondary | ICD-10-CM | POA: Diagnosis not present

## 2018-08-26 LAB — HM DIABETES EYE EXAM

## 2018-09-08 ENCOUNTER — Other Ambulatory Visit: Payer: Self-pay | Admitting: Nephrology

## 2018-09-08 DIAGNOSIS — N2889 Other specified disorders of kidney and ureter: Secondary | ICD-10-CM

## 2018-09-09 ENCOUNTER — Other Ambulatory Visit: Payer: Self-pay | Admitting: Internal Medicine

## 2018-09-13 ENCOUNTER — Ambulatory Visit
Admission: RE | Admit: 2018-09-13 | Discharge: 2018-09-13 | Disposition: A | Discharge status: Home / Self Care | Payer: Medicare Other | Source: Ambulatory Visit | Attending: Nephrology | Admitting: Nephrology

## 2018-09-13 DIAGNOSIS — N2889 Other specified disorders of kidney and ureter: Secondary | ICD-10-CM

## 2018-09-13 MED ORDER — IOPAMIDOL (ISOVUE-300) INJECTION 61%
80.0000 mL | Freq: Once | INTRAVENOUS | Status: AC | PRN
  Administered 2018-09-13: 80 mL via INTRAVENOUS

## 2018-09-27 DIAGNOSIS — D49512 Neoplasm of unspecified behavior of left kidney: Secondary | ICD-10-CM | POA: Diagnosis not present

## 2018-09-27 DIAGNOSIS — N401 Enlarged prostate with lower urinary tract symptoms: Secondary | ICD-10-CM | POA: Diagnosis not present

## 2018-09-27 DIAGNOSIS — R3912 Poor urinary stream: Secondary | ICD-10-CM | POA: Diagnosis not present

## 2018-10-11 DIAGNOSIS — N2889 Other specified disorders of kidney and ureter: Secondary | ICD-10-CM | POA: Diagnosis not present

## 2018-10-11 DIAGNOSIS — G4733 Obstructive sleep apnea (adult) (pediatric): Secondary | ICD-10-CM | POA: Diagnosis not present

## 2018-10-11 DIAGNOSIS — J439 Emphysema, unspecified: Secondary | ICD-10-CM | POA: Diagnosis not present

## 2018-10-25 ENCOUNTER — Other Ambulatory Visit: Payer: Self-pay | Admitting: Internal Medicine

## 2018-10-25 DIAGNOSIS — S86912A Strain of unspecified muscle(s) and tendon(s) at lower leg level, left leg, initial encounter: Secondary | ICD-10-CM | POA: Diagnosis not present

## 2018-11-01 DIAGNOSIS — Z23 Encounter for immunization: Secondary | ICD-10-CM | POA: Diagnosis not present

## 2018-11-14 NOTE — Progress Notes (Signed)
Cardiology Office Note:    Date:  11/15/2018   ID:  Allen Hunter., DOB 1940-11-05, MRN [id number]  PCP:  [name], MD  Cardiologist:  Tacia Hindley  Electrophysiologist:  None   Referring MD: [name], MD   Chief Complaint  Patient presents with  . COPD  . Hypertension     Aug. 25, 2020    Allen Hunter. is a 78 y.o. male with a hx of COPD  ,HTN.  OSA. I met him in April during a telemedicine visit .  Has been eating more canned foods and lots of salty , preserved meats ,  ( [place] sausage,  Spam, bologna, package ham,  [place]ees,  Hot dogs )   he is seen back today for follow up visit Echo from May 23, 2018 revealed normal LV systolic function,  Grade 1 diastolic dysfunction  Seeing him in person for the first time today - first visit was telemedicine     Overall feels well.   Has some DOE , DOE has worsened over the past 18 months .  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     Past Medical History:  Diagnosis Date  . ALLERGIC RHINITIS 10/04/2008  . ASTHMA 10/04/2008  . BACK PAIN 04/18/2008  . BENIGN PROSTATIC HYPERTROPHY 03/09/2007  . COLONIC POLYPS, HX OF 03/09/2007  . COPD 10/20/2007  . DIABETES MELLITUS, TYPE II 03/09/2007  . FATIGUE 10/05/2007  . GERD (gastroesophageal reflux disease) 03/01/2012  . GOUT 10/19/2006  . HYPERLIPIDEMIA 10/19/2006  . HYPERTENSION 10/19/2006  . OSA (obstructive sleep apnea) 07/19/2015  . Prostate cancer ([place]) 01/19/2012  . SWELLING MASS OR LUMP IN HEAD AND NECK 01/09/2010    Past Surgical History:  Procedure Laterality Date  . APPENDECTOMY      Current Medications: Current Meds  Medication Sig  . allopurinol (ZYLOPRIM) 300 MG tablet TAKE 1 TABLET DAILY  . aspirin 81 MG tablet Take 81 mg by mouth daily.    . Continuous Blood Gluc Receiver (FREESTYLE LIBRE 14 DAY READER) DEVI Apply 1 Device topically daily. E11.9  . esomeprazole  (NEXIUM) 40 MG capsule Take 40 mg by mouth daily at 12 noon.  . fluticasone (FLONASE) 50 MCG/ACT nasal spray Place 1 spray into both nostrils daily.  . Fluticasone-Umeclidin-Vilant (TRELEGY ELLIPTA) 100-62.5-25 MCG/INH AEPB Inhale 1 puff into the lungs daily.  Marland Kitchen FREESTYLE LITE test strip USE 1 STRIP AS NEEDED TO CHECK SUGAR TWICE A DAY  . glipiZIDE (GLUCOTROL XL) 10 MG 24 hr tablet TAKE 1 TABLET DAILY WITH BREAKFAST  . Lancets MISC Use asd 1 per day 250.02  . losartan-hydrochlorothiazide (HYZAAR) 50-12.5 MG tablet TAKE 1 TABLET BY MOUTH EVERY DAY  . metFORMIN (GLUCOPHAGE) 1000 MG tablet TAKE 1 TABLET TWICE A DAY WITH A MEAL  . montelukast (SINGULAIR) 10 MG tablet TAKE 1 TABLET (10MG TOTAL ) BY MOUTH AT BEDTIME  . Multiple Vitamins-Minerals (CENTRUM MEN PO) Take 1 tablet by mouth daily.  . Omega-3 Fatty Acids (FISH OIL) 500 MG CAPS Take by mouth daily.    . tamsulosin (FLOMAX) 0.4 MG CAPS capsule Take 1 capsule (0.4 mg total) by mouth daily.  Marland Kitchen VIAGRA 100 MG tablet TAKE ONE-HALF (1/2) TO ONE TABLET (50 MG TO 100 MG) DAILY AS NEEDED FOR ERECTILE DYSFUNCTION  . vitamin B-12 (CYANOCOBALAMIN) 1000 MCG tablet Take 1,000 mcg by mouth daily.  . [  DISCONTINUED] atorvastatin (LIPITOR) 40 MG tablet Take 1 tablet (40 mg total) by mouth daily.  . [DISCONTINUED] JARDIANCE 10 MG TABS tablet TAKE 1 TABLET DAILY     Allergies:   Patient has no known allergies.   Social History   Socioeconomic History  . Marital status: Single    Spouse name: Not on file  . Number of children: Not on file  . Years of education: Not on file  . Highest education level: Not on file  Occupational History  . Occupation: retired [employer] aug 2009  Social Needs  . Financial resource strain: Not hard at all  . Food insecurity    Worry: Never true    Inability: Never true  . Transportation needs    Medical: No    Non-medical: No  Tobacco Use  . Smoking status: Former Research scientist (life sciences)  . Smokeless tobacco: Never Used   Substance and Sexual Activity  . Alcohol use: No  . Drug use: Never  . Sexual activity: Not Currently  Lifestyle  . Physical activity    Days per week: 3 days    Minutes per session: 40 min  . Stress: Not at all  Relationships  . Social Herbalist on phone: Once a week    Gets together: Once a week    Attends religious service: 1 to 4 times per year    Active member of club or organization: No    Attends meetings of clubs or organizations: Never    Relationship status: Not on file  Other Topics Concern  . Not on file  Social History Narrative  . Not on file     Family History: The patient's family history includes Cancer in an other family member; Hypertension in his mother.  ROS:   Please see the history of present illness.     All other systems reviewed and are negative.  EKGs/Labs/Other Studies Reviewed:    The following studies were reviewed today:   EKG:  Aug. 25, 2020: NSR at 65.   No ST or T wave abn.    Recent Labs: 11/15/2018: ALT 14; BUN 42; Creatinine, Ser 1.56; Hemoglobin 13.1; Platelets 238.0; Potassium 4.4; Sodium 142; TSH 2.05  Recent Lipid Panel    Component Value Date/Time   CHOL 199 11/15/2018 0910   TRIG 72.0 11/15/2018 0910   TRIG 49 03/30/2006 1021   HDL 48.70 11/15/2018 0910   CHOLHDL 4 11/15/2018 0910   VLDL 14.4 11/15/2018 0910   LDLCALC 136 (H) 11/15/2018 0910    Physical Exam:    VS:  BP 118/80   Pulse 65   Ht '5\' 5"'  (1.651 m)   Wt 197 lb 12.8 oz (89.7 kg)   SpO2 94%   BMI 32.92 kg/m     Wt Readings from Last 3 Encounters:  11/15/18 197 lb 12.8 oz (89.7 kg)  11/15/18 197 lb (89.4 kg)  07/18/18 194 lb (88 kg)     GEN:   Elderly male, mildly obese  HEENT: Normal NECK: No JVD; No carotid bruits LYMPHATICS: No lymphadenopathy CARDIAC: RRR, no murmurs, rubs, gallops RESPIRATORY:  Clear to auscultation without rales, wheezing or rhonchi  ABDOMEN: Soft, non-tender, non-distended MUSCULOSKELETAL:  No edema; No  deformity  SKIN: Warm and dry NEUROLOGIC:  Alert and oriented x 3 PSYCHIATRIC:  Normal affect   ASSESSMENT:    1. Hyperlipidemia, unspecified hyperlipidemia type   2. Shortness of breath   3. Type 2 diabetes mellitus without complication, without long-term  current use of insulin ([place])   4. Coronary artery calcification    PLAN:    In order of problems listed above:  1. DOE:   Has progressive DOE for the past year.  He has grade 1 diastolic dysfunction.  I doubt that this degree of mild diastolic CHF will cause such shortness of breath with exertion.  Has coronary art. Calcifications by CT scan.   Long hx of DM, HLD, HTN.   I would like to get a Coronary CT angiogram for further evaluation   Advised him to restart his exercise program .  I also advised him to work on a weight loss program.  I think both of these are likely to help with his exertional capacity.   Medication Adjustments/Labs and Tests Ordered: Current medicines are reviewed at length with the patient today.  Concerns regarding medicines are outlined above.  Orders Placed This Encounter  Procedures  . CT CORONARY MORPH W/CTA COR W/SCORE W/CA W/CM &/OR WO/CM  . CT CORONARY FRACTIONAL FLOW RESERVE DATA PREP  . CT CORONARY FRACTIONAL FLOW RESERVE FLUID ANALYSIS  . EKG 12-Lead   Meds ordered this encounter  Medications  . metoprolol tartrate (LOPRESSOR) 50 MG tablet    Sig: Take 1 tablet (50 mg) two hours prior to your CT appointment.    Dispense:  1 tablet    Refill:  0    Patient Instructions  Your cardiac CT will be scheduled at one of the below locations:   [facility] [address] [place], [place] [address] [phone]  [place] [address] [place], [place] [address] [phone]  If your test is scheduled at [facility], please arrive at the [facility] main entrance 30-45 minutes prior to test start time. Proceed to the [facility] Radiology Department (first floor) to check-in and test prep.  Please follow these instructions carefully (unless otherwise directed):  Hold all erectile dysfunction medications at least 48 hours prior to test.  On the Night Before the Test: . Be sure to Drink plenty of water. . Do not consume any caffeinated/decaffeinated beverages or chocolate 12 hours prior to your test. . Do not take any antihistamines 12 hours prior to your test. . If you take Metformin do not take 24 hours prior to test.  On the Day of the Test: . Drink plenty of water. Do not drink any water within one hour of the test. . Do not eat any food 4 hours prior to the test. . You may take your regular medications prior to the test.  . Take metoprolol (Lopressor) 50 mg two hours prior to test. This has been called in to your pharmacy. Marland Kitchen HOLD HYZAAR the morning of the test.  After the Test: . Drink plenty of water. . After receiving IV contrast, you may experience a mild flushed feeling. This is normal. . On occasion, you may experience a mild rash up to 24 hours after the test. This is not dangerous. If this occurs, you can take Benadryl 25 mg and increase your fluid intake. . If you experience trouble breathing, this can be serious. If it is severe call 911 IMMEDIATELY. If it is mild, please call our office. . If you take any of these medications: Glipizide/Metformin, Avandament, Glucavance, please do not take 48 hours after completing test.    Please contact the cardiac imaging nurse navigator should you have any questions/concerns Marchia Bond, RN Navigator Cardiac  Imaging [facility] Heart and Vascular Services [phone] Office  [phone] Cell      Signed, Mertie Moores, MD  11/15/2018 12:57 PM    [place]

## 2018-11-15 ENCOUNTER — Ambulatory Visit (INDEPENDENT_AMBULATORY_CARE_PROVIDER_SITE_OTHER): Payer: Medicare Other | Admitting: Internal Medicine

## 2018-11-15 ENCOUNTER — Encounter: Payer: Self-pay | Admitting: Internal Medicine

## 2018-11-15 ENCOUNTER — Other Ambulatory Visit (INDEPENDENT_AMBULATORY_CARE_PROVIDER_SITE_OTHER): Payer: Medicare Other

## 2018-11-15 ENCOUNTER — Encounter: Payer: Self-pay | Admitting: Cardiovascular Disease

## 2018-11-15 ENCOUNTER — Ambulatory Visit (INDEPENDENT_AMBULATORY_CARE_PROVIDER_SITE_OTHER): Payer: Medicare Other | Admitting: Cardiovascular Disease

## 2018-11-15 ENCOUNTER — Other Ambulatory Visit: Payer: Self-pay

## 2018-11-15 ENCOUNTER — Telehealth: Payer: Self-pay

## 2018-11-15 ENCOUNTER — Telehealth: Payer: Self-pay | Admitting: Internal Medicine

## 2018-11-15 VITALS — BP 132/86 | HR 74 | Temp 97.9°F | Ht 65.0 in | Wt 197.0 lb

## 2018-11-15 VITALS — BP 118/80 | HR 65 | Ht 65.0 in | Wt 197.8 lb

## 2018-11-15 DIAGNOSIS — E785 Hyperlipidemia, unspecified: Secondary | ICD-10-CM | POA: Diagnosis not present

## 2018-11-15 DIAGNOSIS — E559 Vitamin D deficiency, unspecified: Secondary | ICD-10-CM

## 2018-11-15 DIAGNOSIS — I2584 Coronary atherosclerosis due to calcified coronary lesion: Secondary | ICD-10-CM | POA: Diagnosis not present

## 2018-11-15 DIAGNOSIS — I2510 Atherosclerotic heart disease of native coronary artery without angina pectoris: Secondary | ICD-10-CM

## 2018-11-15 DIAGNOSIS — E119 Type 2 diabetes mellitus without complications: Secondary | ICD-10-CM

## 2018-11-15 DIAGNOSIS — R0602 Shortness of breath: Secondary | ICD-10-CM | POA: Diagnosis not present

## 2018-11-15 DIAGNOSIS — I10 Essential (primary) hypertension: Secondary | ICD-10-CM

## 2018-11-15 DIAGNOSIS — E611 Iron deficiency: Secondary | ICD-10-CM | POA: Diagnosis not present

## 2018-11-15 DIAGNOSIS — R0600 Dyspnea, unspecified: Secondary | ICD-10-CM

## 2018-11-15 DIAGNOSIS — E538 Deficiency of other specified B group vitamins: Secondary | ICD-10-CM

## 2018-11-15 DIAGNOSIS — R0609 Other forms of dyspnea: Secondary | ICD-10-CM | POA: Diagnosis not present

## 2018-11-15 DIAGNOSIS — I5032 Chronic diastolic (congestive) heart failure: Secondary | ICD-10-CM | POA: Diagnosis not present

## 2018-11-15 DIAGNOSIS — J449 Chronic obstructive pulmonary disease, unspecified: Secondary | ICD-10-CM | POA: Diagnosis not present

## 2018-11-15 DIAGNOSIS — C61 Malignant neoplasm of prostate: Secondary | ICD-10-CM | POA: Diagnosis not present

## 2018-11-15 LAB — CBC WITH DIFFERENTIAL/PLATELET
Basophils Absolute: 0 10*3/uL (ref 0.0–0.1)
Basophils Relative: 0.3 % (ref 0.0–3.0)
Eosinophils Absolute: 0.1 10*3/uL (ref 0.0–0.7)
Eosinophils Relative: 2.9 % (ref 0.0–5.0)
HCT: 41.2 % (ref 39.0–52.0)
Hemoglobin: 13.1 g/dL (ref 13.0–17.0)
Lymphocytes Relative: 27.3 % (ref 12.0–46.0)
Lymphs Abs: 1 10*3/uL (ref 0.7–4.0)
MCHC: 31.7 g/dL (ref 30.0–36.0)
MCV: 86.9 fl (ref 78.0–100.0)
Monocytes Absolute: 0.4 10*3/uL (ref 0.1–1.0)
Monocytes Relative: 10.4 % (ref 3.0–12.0)
Neutro Abs: 2.2 10*3/uL (ref 1.4–7.7)
Neutrophils Relative %: 59.1 % (ref 43.0–77.0)
Platelets: 238 10*3/uL (ref 150.0–400.0)
RBC: 4.75 Mil/uL (ref 4.22–5.81)
RDW: 18.3 % — ABNORMAL HIGH (ref 11.5–15.5)
WBC: 3.8 10*3/uL — ABNORMAL LOW (ref 4.0–10.5)

## 2018-11-15 LAB — BASIC METABOLIC PANEL
BUN: 42 mg/dL — ABNORMAL HIGH (ref 6–23)
CO2: 29 mEq/L (ref 19–32)
Calcium: 9.5 mg/dL (ref 8.4–10.5)
Chloride: 105 mEq/L (ref 96–112)
Creatinine, Ser: 1.56 mg/dL — ABNORMAL HIGH (ref 0.40–1.50)
GFR: 52.06 mL/min — ABNORMAL LOW (ref >60.00)
Glucose, Bld: 150 mg/dL — ABNORMAL HIGH (ref 70–99)
Potassium: 4.4 mEq/L (ref 3.5–5.1)
Sodium: 142 mEq/L (ref 135–145)

## 2018-11-15 LAB — IBC PANEL
Iron: 66 ug/dL (ref 42–165)
Saturation Ratios: 15.2 % — ABNORMAL LOW (ref 20.0–50.0)
Transferrin: 310 mg/dL (ref 212.0–360.0)

## 2018-11-15 LAB — URINALYSIS, ROUTINE W REFLEX MICROSCOPIC
Bilirubin Urine: NEGATIVE
Hgb urine dipstick: NEGATIVE
Ketones, ur: NEGATIVE
Leukocytes,Ua: NEGATIVE
Nitrite: NEGATIVE
RBC / HPF: NONE SEEN (ref 0–?)
Specific Gravity, Urine: 1.015 (ref 1.000–1.030)
Total Protein, Urine: NEGATIVE
Urine Glucose: >=1000 — AB
Urobilinogen, UA: 0.2 (ref 0.0–1.0)
WBC, UA: NONE SEEN (ref 0–?)
pH: 7 (ref 5.0–8.0)

## 2018-11-15 LAB — LIPID PANEL
Cholesterol: 199 mg/dL (ref 0–200)
HDL: 48.7 mg/dL (ref >39.00)
LDL Cholesterol: 136 mg/dL — ABNORMAL HIGH (ref 0–99)
NonHDL: 150.02
Total CHOL/HDL Ratio: 4
Triglycerides: 72 mg/dL (ref 0.0–149.0)
VLDL: 14.4 mg/dL (ref 0.0–40.0)

## 2018-11-15 LAB — MICROALBUMIN / CREATININE URINE RATIO
Creatinine,U: 79.5 mg/dL
Microalb Creat Ratio: 2.7 mg/g (ref 0.0–30.0)
Microalb, Ur: 2.2 mg/dL — ABNORMAL HIGH (ref 0.0–1.9)

## 2018-11-15 LAB — VITAMIN B12: Vitamin B-12: >1500 pg/mL — ABNORMAL HIGH (ref 211–911)

## 2018-11-15 LAB — HEPATIC FUNCTION PANEL
ALT: 14 U/L (ref 0–53)
AST: 14 U/L (ref 0–37)
Albumin: 4.4 g/dL (ref 3.5–5.2)
Alkaline Phosphatase: 46 U/L (ref 39–117)
Bilirubin, Direct: 0.1 mg/dL (ref 0.0–0.3)
Total Bilirubin: 0.4 mg/dL (ref 0.2–1.2)
Total Protein: 6.6 g/dL (ref 6.0–8.3)

## 2018-11-15 LAB — TSH: TSH: 2.05 u[IU]/mL (ref 0.35–4.50)

## 2018-11-15 LAB — HEMOGLOBIN A1C: Hgb A1c MFr Bld: 8.2 % — ABNORMAL HIGH (ref 4.6–6.5)

## 2018-11-15 LAB — VITAMIN D 25 HYDROXY (VIT D DEFICIENCY, FRACTURES): VITD: 53.21 ng/mL (ref 30.00–100.00)

## 2018-11-15 LAB — PSA: PSA: 4.88 ng/mL — ABNORMAL HIGH (ref 0.10–4.00)

## 2018-11-15 MED ORDER — ATORVASTATIN CALCIUM 80 MG PO TABS: 80.0000 mg | ORAL_TABLET | Freq: Every day | ORAL | 3 refills | Status: DC

## 2018-11-15 MED ORDER — METOPROLOL TARTRATE 50 MG PO TABS: ORAL_TABLET | ORAL | 0 refills | Status: DC

## 2018-11-15 MED ORDER — JARDIANCE 25 MG PO TABS: 25.0000 mg | ORAL_TABLET | Freq: Every day | ORAL | 3 refills | Status: DC

## 2018-11-15 NOTE — Telephone Encounter (Signed)
-----   Message from [name], MD sent at 11/15/2018 11:59 AM EDT ----- Left message on MyChart, pt to cont same tx except  The test results show that your current treatment is OK, as the kidney function is stable, but the LDL cholesterol is still high, and the A1c is mildly higher as well.    We need to: 1)  Increase the generic lipitor to 80 mg daily 2)  Increase the Jardiance to 25 mg per day 3)  OK to continue the metformin as is, as well as all other medications as well    Markey Deady to please inform pt, I will do rx x 2

## 2018-11-15 NOTE — Telephone Encounter (Signed)
Done erx 

## 2018-11-15 NOTE — Telephone Encounter (Signed)
Pt has viewed results via MyChart  

## 2018-11-15 NOTE — Patient Instructions (Addendum)

## 2018-11-15 NOTE — Patient Instructions (Addendum)
Your cardiac CT will be scheduled at one of the below locations:   [facility] [address] [place], [place] [address] (336) [place] [address] [place], [place] [address] [phone]  If your test is scheduled at [facility], please arrive at the [facility] main entrance 30-45 minutes prior to test start time. Proceed to the [facility] Radiology Department (first floor) to check-in and test prep.  Please follow these instructions carefully (unless otherwise directed):  Hold all erectile dysfunction medications at least 48 hours prior to test.  On the Night Before the Test: . Be sure to Drink plenty of water. . Do not consume any caffeinated/decaffeinated beverages or chocolate 12 hours prior to your test. . Do not take any antihistamines 12 hours prior to your test. . If you take Metformin do not take 24 hours prior to test.  On the Day of the Test: . Drink plenty of water. Do not drink any water within one hour of the test. . Do not eat any food 4 hours prior to the test. . You may take your regular medications prior to the test.  . Take metoprolol (Lopressor) 50 mg two hours prior to test. This has been called in to your pharmacy. Marland Kitchen HOLD HYZAAR the morning of the test.  After the Test: . Drink plenty of water. . After receiving IV contrast, you may experience a mild flushed feeling. This is normal. . On occasion, you may experience a mild rash up to 24 hours after the test. This is not dangerous. If this occurs, you can take Benadryl 25 mg and increase your fluid intake. . If you experience trouble breathing, this can be serious. If it is severe call 911 IMMEDIATELY. If it is mild, please call our office. . If you take any of these medications: Glipizide/Metformin, Avandament, Glucavance, please do not take 48 hours after completing test.    Please contact the cardiac imaging nurse navigator  should you have any questions/concerns Marchia Bond, RN Navigator Cardiac [place] and Vascular Services [phone] Office  [phone] Cell

## 2018-11-15 NOTE — Progress Notes (Signed)
Subjective:    Patient ID: Allen Hunter., male    DOB: 06-30-1940, 78 y.o.   MRN: [id number]  HPI   Here to f/u; overall doing ok,  Pt denies chest pain, increasing sob or doe, wheezing, orthopnea, PND, increased LE swelling, palpitations, dizziness or syncope.  Pt denies new neurological symptoms such as new headache, or facial or extremity weakness or numbness.  Pt denies polydipsia, polyuria, or low sugar episode.  Pt states overall good compliance with meds, mostly trying to follow appropriate diet, with wt overall stable,  but little exercise however.   Pt denies fever, wt loss, night sweats, loss of appetite, or other constitutional symptoms Wt Readings from Last 3 Encounters:  11/15/18 197 lb (89.4 kg)  07/18/18 194 lb (88 kg)  05/17/18 197 lb (89.4 kg)  No new complaints Past Medical History:  Diagnosis Date  . ALLERGIC RHINITIS 10/04/2008  . ASTHMA 10/04/2008  . BACK PAIN 04/18/2008  . BENIGN PROSTATIC HYPERTROPHY 03/09/2007  . COLONIC POLYPS, HX OF 03/09/2007  . COPD 10/20/2007  . DIABETES MELLITUS, TYPE II 03/09/2007  . FATIGUE 10/05/2007  . GERD (gastroesophageal reflux disease) 03/01/2012  . GOUT 10/19/2006  . HYPERLIPIDEMIA 10/19/2006  . HYPERTENSION 10/19/2006  . OSA (obstructive sleep apnea) 07/19/2015  . Prostate cancer ([place]) 01/19/2012  . SWELLING MASS OR LUMP IN HEAD AND NECK 01/09/2010   Past Surgical History:  Procedure Laterality Date  . APPENDECTOMY      reports that he has quit smoking. He has never used smokeless tobacco. He reports that he does not drink alcohol or use drugs. family history includes Cancer in an other family member; Hypertension in his mother. No Known Allergies Current Outpatient Medications on File Prior to Visit  Medication Sig Dispense Refill  . allopurinol (ZYLOPRIM) 300 MG tablet TAKE 1 TABLET DAILY 90 tablet 4  . aspirin 81 MG tablet Take 81 mg by mouth daily.      . Continuous Blood Gluc Receiver (FREESTYLE LIBRE 14 DAY READER)  DEVI Apply 1 Device topically daily. E11.9 1 Device 0  . esomeprazole (NEXIUM) 40 MG capsule Take 40 mg by mouth daily at 12 noon.    . fluticasone (FLONASE) 50 MCG/ACT nasal spray Place 1 spray into both nostrils daily.    . Fluticasone-Umeclidin-Vilant (TRELEGY ELLIPTA) 100-62.5-25 MCG/INH AEPB Inhale 1 puff into the lungs daily. 3 each 3  . FREESTYLE LITE test strip USE 1 STRIP AS NEEDED TO CHECK SUGAR TWICE A DAY 100 each 6  . glipiZIDE (GLUCOTROL XL) 10 MG 24 hr tablet TAKE 1 TABLET DAILY WITH BREAKFAST 90 tablet 3  . Lancets MISC Use asd 1 per day 250.02 100 each 11  . losartan-hydrochlorothiazide (HYZAAR) 50-12.5 MG tablet TAKE 1 TABLET BY MOUTH EVERY DAY 90 tablet 1  . metFORMIN (GLUCOPHAGE) 1000 MG tablet TAKE 1 TABLET TWICE A DAY WITH A MEAL 180 tablet 1  . montelukast (SINGULAIR) 10 MG tablet TAKE 1 TABLET (10MG  TOTAL ) BY MOUTH AT BEDTIME 90 tablet 2  . Multiple Vitamins-Minerals (CENTRUM MEN PO) Take 1 tablet by mouth daily.    . Omega-3 Fatty Acids (FISH OIL) 500 MG CAPS Take by mouth daily.      . tamsulosin (FLOMAX) 0.4 MG CAPS capsule Take 1 capsule (0.4 mg total) by mouth daily. 90 capsule 3  . VIAGRA 100 MG tablet TAKE ONE-HALF (1/2) TO ONE TABLET (50 MG TO 100 MG) DAILY AS NEEDED FOR ERECTILE DYSFUNCTION 18 tablet 0  . vitamin  B-12 (CYANOCOBALAMIN) 1000 MCG tablet Take 1,000 mcg by mouth daily.     No current facility-administered medications on file prior to visit.    Review of Systems  Constitutional: Negative for other unusual diaphoresis or sweats HENT: Negative for ear discharge or swelling Eyes: Negative for other worsening visual disturbances Respiratory: Negative for stridor or other swelling  Gastrointestinal: Negative for worsening distension or other blood Genitourinary: Negative for retention or other urinary change Musculoskeletal: Negative for other MSK pain or swelling Skin: Negative for color change or other new lesions Neurological: Negative for  worsening tremors and other numbness  Psychiatric/Behavioral: Negative for worsening agitation or other fatigue All other system neg per pt    Objective:   Physical Exam BP 132/86   Pulse 74   Temp 97.9 F (36.6 C) (Oral)   Ht 5\' 5"  (1.651 m)   Wt 197 lb (89.4 kg)   SpO2 95%   BMI 32.78 kg/m  VS noted,  Constitutional: Pt appears in NAD HENT: Head: NCAT.  Right Ear: External ear normal.  Left Ear: External ear normal.  Eyes: . Pupils are equal, round, and reactive to light. Conjunctivae and EOM are normal Nose: without d/c or deformity Neck: Neck supple. Gross normal ROM Cardiovascular: Normal rate and regular rhythm.   Pulmonary/Chest: Effort normal and breath sounds without rales or wheezing.  Abd:  Soft, NT, ND, + BS, no organomegaly Neurological: Pt is alert. At baseline orientation, motor grossly intact Skin: Skin is warm. No rashes, other new lesions, no LE edema Psychiatric: Pt behavior is normal without agitation  No other exam findings Lab Results  Component Value Date   WBC 3.8 (L) 11/15/2018   HGB 13.1 11/15/2018   HCT 41.2 11/15/2018   PLT 238.0 11/15/2018   GLUCOSE 150 (H) 11/15/2018   CHOL 199 11/15/2018   TRIG 72.0 11/15/2018   HDL 48.70 11/15/2018   LDLCALC 136 (H) 11/15/2018   ALT 14 11/15/2018   AST 14 11/15/2018   NA 142 11/15/2018   K 4.4 11/15/2018   CL 105 11/15/2018   CREATININE 1.56 (H) 11/15/2018   BUN 42 (H) 11/15/2018   CO2 29 11/15/2018   TSH 2.05 11/15/2018   PSA 4.88 (H) 11/15/2018   HGBA1C 8.2 (H) 11/15/2018   MICROALBUR 2.2 (H) 11/15/2018      Assessment & Plan:

## 2018-11-17 ENCOUNTER — Ambulatory Visit: Payer: Medicare Other | Admitting: Cardiovascular Disease

## 2018-11-19 ENCOUNTER — Encounter: Payer: Self-pay | Admitting: Internal Medicine

## 2018-11-19 NOTE — Assessment & Plan Note (Signed)
stable overall by history and exam, recent data reviewed with pt, and pt to continue medical treatment as before,  to f/u any worsening symptoms or concerns, consider off metformin for worsening ckd

## 2018-11-19 NOTE — Assessment & Plan Note (Signed)
For f/u psa, f/u urology as planned

## 2018-11-19 NOTE — Assessment & Plan Note (Signed)
stable overall by history and exam, recent data reviewed with pt, and pt to continue medical treatment as before,  to f/u any worsening symptoms or concerns, f/u lab on statin increased dose last visit

## 2018-11-19 NOTE — Assessment & Plan Note (Signed)
stable overall by history and exam, recent data reviewed with pt, and pt to continue medical treatment as before,  to f/u any worsening symptoms or concerns  

## 2018-11-29 ENCOUNTER — Telehealth (HOSPITAL_COMMUNITY): Payer: Self-pay | Admitting: Emergency Medicine

## 2018-11-29 NOTE — Telephone Encounter (Signed)
Reaching out to patient to offer assistance regarding upcoming cardiac imaging study; pt verbalizes understanding of appt date/time, parking situation and where to check in, pre-test NPO status and medications ordered, and verified current allergies; name and call back number provided for further questions should they arise Allen Applegate RN Navigator Cardiac Imaging [place] [facility] [phone] office [phone] cell 

## 2018-11-30 ENCOUNTER — Other Ambulatory Visit: Payer: Self-pay

## 2018-11-30 ENCOUNTER — Ambulatory Visit
Admission: RE | Admit: 2018-11-30 | Discharge: 2018-11-30 | Disposition: A | Discharge status: Home / Self Care | Payer: Medicare Other | Source: Ambulatory Visit | Attending: Cardiovascular Disease | Admitting: Cardiovascular Disease

## 2018-11-30 DIAGNOSIS — I2510 Atherosclerotic heart disease of native coronary artery without angina pectoris: Secondary | ICD-10-CM | POA: Diagnosis not present

## 2018-11-30 DIAGNOSIS — R0602 Shortness of breath: Secondary | ICD-10-CM | POA: Diagnosis not present

## 2018-11-30 DIAGNOSIS — E785 Hyperlipidemia, unspecified: Secondary | ICD-10-CM | POA: Diagnosis not present

## 2018-11-30 DIAGNOSIS — I2584 Coronary atherosclerosis due to calcified coronary lesion: Secondary | ICD-10-CM | POA: Diagnosis not present

## 2018-11-30 DIAGNOSIS — E119 Type 2 diabetes mellitus without complications: Secondary | ICD-10-CM | POA: Insufficient documentation

## 2018-11-30 HISTORY — DX: Unspecified asthma, uncomplicated: J45.909

## 2018-11-30 MED ORDER — NITROGLYCERIN 0.4 MG SL SUBL
0.8000 mg | SUBLINGUAL_TABLET | Freq: Once | SUBLINGUAL | Status: AC
  Administered 2018-11-30: 10:00:00 0.8 mg via SUBLINGUAL

## 2018-11-30 MED ORDER — METOPROLOL TARTRATE 5 MG/5ML IV SOLN: 5.0000 mg | INTRAVENOUS | Status: DC | PRN

## 2018-11-30 MED ORDER — METOPROLOL TARTRATE 5 MG/5ML IV SOLN
10.0000 mg | Freq: Once | INTRAVENOUS | Status: AC
  Administered 2018-11-30: 10 mg via INTRAVENOUS

## 2018-11-30 MED ORDER — METOPROLOL TARTRATE 5 MG/5ML IV SOLN
10.0000 mg | Freq: Once | INTRAVENOUS | Status: AC
  Administered 2018-11-30: 10:00:00 10 mg via INTRAVENOUS

## 2018-11-30 MED ORDER — IOHEXOL 350 MG/ML SOLN
75.0000 mL | Freq: Once | INTRAVENOUS | Status: AC | PRN
  Administered 2018-11-30: 10:00:00 75 mL via INTRAVENOUS

## 2018-11-30 MED ORDER — METOPROLOL TARTRATE 5 MG/5ML IV SOLN
5.0000 mg | Freq: Once | INTRAVENOUS | Status: AC
  Administered 2018-11-30: 5 mg via INTRAVENOUS

## 2018-11-30 NOTE — Progress Notes (Signed)
Patient tolerated CT without incident. Drank coffee and ambulated to exit steady gait.

## 2018-12-07 DIAGNOSIS — N2889 Other specified disorders of kidney and ureter: Secondary | ICD-10-CM | POA: Diagnosis not present

## 2018-12-07 DIAGNOSIS — I129 Hypertensive chronic kidney disease with stage 1 through stage 4 chronic kidney disease, or unspecified chronic kidney disease: Secondary | ICD-10-CM | POA: Diagnosis not present

## 2018-12-07 DIAGNOSIS — N183 Chronic kidney disease, stage 3 (moderate): Secondary | ICD-10-CM | POA: Diagnosis not present

## 2018-12-07 DIAGNOSIS — C61 Malignant neoplasm of prostate: Secondary | ICD-10-CM | POA: Diagnosis not present

## 2018-12-07 DIAGNOSIS — N390 Urinary tract infection, site not specified: Secondary | ICD-10-CM | POA: Diagnosis not present

## 2018-12-07 DIAGNOSIS — E669 Obesity, unspecified: Secondary | ICD-10-CM | POA: Diagnosis not present

## 2018-12-11 ENCOUNTER — Other Ambulatory Visit: Payer: Self-pay | Admitting: Internal Medicine

## 2018-12-11 DIAGNOSIS — E119 Type 2 diabetes mellitus without complications: Secondary | ICD-10-CM

## 2018-12-12 DIAGNOSIS — R0600 Dyspnea, unspecified: Secondary | ICD-10-CM | POA: Diagnosis not present

## 2018-12-12 DIAGNOSIS — G4733 Obstructive sleep apnea (adult) (pediatric): Secondary | ICD-10-CM | POA: Diagnosis not present

## 2018-12-12 DIAGNOSIS — J449 Chronic obstructive pulmonary disease, unspecified: Secondary | ICD-10-CM | POA: Diagnosis not present

## 2019-02-02 ENCOUNTER — Other Ambulatory Visit: Payer: Self-pay | Admitting: Internal Medicine

## 2019-02-02 MED ORDER — LOSARTAN POTASSIUM-HCTZ 50-12.5 MG PO TABS: 1.0000 | ORAL_TABLET | Freq: Every day | ORAL | 1 refills | Status: DC

## 2019-02-02 NOTE — Telephone Encounter (Signed)
losartan-hydrochlorothiazide (HYZAAR) 50-12.5 MG tablet     Patient requesting refill.    Pharmacy:  [place], [place] - [address] [phone] (Phone) [phone] (Fax)

## 2019-02-08 ENCOUNTER — Other Ambulatory Visit: Payer: Self-pay | Admitting: Internal Medicine

## 2019-02-10 ENCOUNTER — Telehealth: Payer: Self-pay | Admitting: Internal Medicine

## 2019-02-10 MED ORDER — ATORVASTATIN CALCIUM 80 MG PO TABS: 80.0000 mg | ORAL_TABLET | Freq: Every day | ORAL | 3 refills | Status: DC

## 2019-02-10 MED ORDER — JARDIANCE 25 MG PO TABS: 25.0000 mg | ORAL_TABLET | Freq: Every day | ORAL | 3 refills | Status: DC

## 2019-02-10 NOTE — Telephone Encounter (Signed)
Patient is requesting a refill on these medications, due to his [facility] scripts had a delay and he has not revieced medications, Patient states he is not sure when he will get the medications.   REFILLS Atorvastatin (LIPITOR) 80MG  Tablet Empagliflozin (JARDIANCE) 25MG  TABS tablet PHARMACY [id number], [place] [phone] (Phone) [phone] (Fax)

## 2019-02-13 DIAGNOSIS — D49512 Neoplasm of unspecified behavior of left kidney: Secondary | ICD-10-CM | POA: Diagnosis not present

## 2019-02-15 DIAGNOSIS — R3912 Poor urinary stream: Secondary | ICD-10-CM | POA: Diagnosis not present

## 2019-02-15 DIAGNOSIS — N401 Enlarged prostate with lower urinary tract symptoms: Secondary | ICD-10-CM | POA: Diagnosis not present

## 2019-02-15 DIAGNOSIS — C61 Malignant neoplasm of prostate: Secondary | ICD-10-CM | POA: Diagnosis not present

## 2019-02-15 DIAGNOSIS — D49512 Neoplasm of unspecified behavior of left kidney: Secondary | ICD-10-CM | POA: Diagnosis not present

## 2019-03-08 ENCOUNTER — Other Ambulatory Visit: Payer: Self-pay | Admitting: Internal Medicine

## 2019-05-10 ENCOUNTER — Ambulatory Visit (INDEPENDENT_AMBULATORY_CARE_PROVIDER_SITE_OTHER): Payer: Medicare Other | Admitting: Internal Medicine

## 2019-05-10 ENCOUNTER — Other Ambulatory Visit: Payer: Self-pay

## 2019-05-10 ENCOUNTER — Encounter: Payer: Self-pay | Admitting: Internal Medicine

## 2019-05-10 VITALS — BP 124/80 | Temp 97.6°F | Ht 65.0 in | Wt 182.4 lb

## 2019-05-10 DIAGNOSIS — E119 Type 2 diabetes mellitus without complications: Secondary | ICD-10-CM | POA: Diagnosis not present

## 2019-05-10 DIAGNOSIS — K5900 Constipation, unspecified: Secondary | ICD-10-CM | POA: Diagnosis not present

## 2019-05-10 DIAGNOSIS — Z0000 Encounter for general adult medical examination without abnormal findings: Secondary | ICD-10-CM | POA: Diagnosis not present

## 2019-05-10 LAB — HEPATIC FUNCTION PANEL
ALT: 21 U/L (ref 0–53)
AST: 15 U/L (ref 0–37)
Albumin: 4.3 g/dL (ref 3.5–5.2)
Alkaline Phosphatase: 67 U/L (ref 39–117)
Bilirubin, Direct: 0.1 mg/dL (ref 0.0–0.3)
Total Bilirubin: 0.5 mg/dL (ref 0.2–1.2)
Total Protein: 6.7 g/dL (ref 6.0–8.3)

## 2019-05-10 LAB — BASIC METABOLIC PANEL
BUN: 44 mg/dL — ABNORMAL HIGH (ref 6–23)
CO2: 32 mEq/L (ref 19–32)
Calcium: 10.4 mg/dL (ref 8.4–10.5)
Chloride: 101 mEq/L (ref 96–112)
Creatinine, Ser: 1.12 mg/dL (ref 0.40–1.50)
GFR: 76.22 mL/min (ref >60.00)
Glucose, Bld: 115 mg/dL — ABNORMAL HIGH (ref 70–99)
Potassium: 4.3 mEq/L (ref 3.5–5.1)
Sodium: 141 mEq/L (ref 135–145)

## 2019-05-10 LAB — CBC WITH DIFFERENTIAL/PLATELET
Basophils Absolute: 0 10*3/uL (ref 0.0–0.1)
Basophils Relative: 0.9 % (ref 0.0–3.0)
Eosinophils Absolute: 0.1 10*3/uL (ref 0.0–0.7)
Eosinophils Relative: 2.2 % (ref 0.0–5.0)
HCT: 44.2 % (ref 39.0–52.0)
Hemoglobin: 14.1 g/dL (ref 13.0–17.0)
Lymphocytes Relative: 29.2 % (ref 12.0–46.0)
Lymphs Abs: 1.2 10*3/uL (ref 0.7–4.0)
MCHC: 31.9 g/dL (ref 30.0–36.0)
MCV: 90.5 fl (ref 78.0–100.0)
Monocytes Absolute: 0.4 10*3/uL (ref 0.1–1.0)
Monocytes Relative: 10 % (ref 3.0–12.0)
Neutro Abs: 2.5 10*3/uL (ref 1.4–7.7)
Neutrophils Relative %: 57.7 % (ref 43.0–77.0)
Platelets: 237 10*3/uL (ref 150.0–400.0)
RBC: 4.89 Mil/uL (ref 4.22–5.81)
RDW: 17.3 % — ABNORMAL HIGH (ref 11.5–15.5)
WBC: 4.3 10*3/uL (ref 4.0–10.5)

## 2019-05-10 LAB — URINALYSIS, ROUTINE W REFLEX MICROSCOPIC
Bilirubin Urine: NEGATIVE
Hgb urine dipstick: NEGATIVE
Ketones, ur: NEGATIVE
Leukocytes,Ua: NEGATIVE
Nitrite: NEGATIVE
RBC / HPF: NONE SEEN (ref 0–?)
Specific Gravity, Urine: 1.02 (ref 1.000–1.030)
Total Protein, Urine: NEGATIVE
Urine Glucose: >=1000 — AB
Urobilinogen, UA: 0.2 (ref 0.0–1.0)
WBC, UA: NONE SEEN (ref 0–?)
pH: 5.5 (ref 5.0–8.0)

## 2019-05-10 LAB — MICROALBUMIN / CREATININE URINE RATIO
Creatinine,U: 63.9 mg/dL
Microalb Creat Ratio: 2.7 mg/g (ref 0.0–30.0)
Microalb, Ur: 1.7 mg/dL (ref 0.0–1.9)

## 2019-05-10 LAB — LIPID PANEL
Cholesterol: 182 mg/dL (ref 0–200)
HDL: 53.3 mg/dL (ref >39.00)
LDL Cholesterol: 112 mg/dL — ABNORMAL HIGH (ref 0–99)
NonHDL: 128.48
Total CHOL/HDL Ratio: 3
Triglycerides: 83 mg/dL (ref 0.0–149.0)
VLDL: 16.6 mg/dL (ref 0.0–40.0)

## 2019-05-10 LAB — TSH: TSH: 1.71 u[IU]/mL (ref 0.35–4.50)

## 2019-05-10 LAB — PSA: PSA: 3.7 ng/mL (ref 0.10–4.00)

## 2019-05-10 LAB — HEMOGLOBIN A1C: Hgb A1c MFr Bld: 9.1 % — ABNORMAL HIGH (ref 4.6–6.5)

## 2019-05-10 MED ORDER — LACTULOSE 20 GM/30ML PO SOLN: ORAL | 5 refills | Status: DC

## 2019-05-10 NOTE — Assessment & Plan Note (Signed)
For lactulose prn

## 2019-05-10 NOTE — Progress Notes (Signed)
Subjective:    Patient ID: Allen Dakin., male    DOB: Aug 12, 1940, 79 y.o.   MRN: [id number]  HPI  Here for wellness and f/u;  Overall doing ok;  Pt denies Chest pain, worsening SOB, DOE, wheezing, orthopnea, PND, worsening LE edema, palpitations, dizziness or syncope.  Pt denies neurological change such as new headache, facial or extremity weakness.  Pt denies polydipsia, polyuria, or low sugar symptoms. Pt states overall good compliance with treatment and medications, good tolerability, and has been trying to follow appropriate diet.  Pt denies worsening depressive symptoms, suicidal ideation or panic. No fever, night sweats, wt loss, loss of appetite, or other constitutional symptoms.  Pt states good ability with ADL's, has low fall risk, home safety reviewed and adequate, no other significant changes in hearing or vision, and only occasionally active with exercise. Has ongoing constipation for about 3 weeks as well Past Medical History:  Diagnosis Date  . ALLERGIC RHINITIS 10/04/2008  . ASTHMA 10/04/2008  . Asthma   . BACK PAIN 04/18/2008  . BENIGN PROSTATIC HYPERTROPHY 03/09/2007  . COLONIC POLYPS, HX OF 03/09/2007  . COPD 10/20/2007  . DIABETES MELLITUS, TYPE II 03/09/2007  . FATIGUE 10/05/2007  . GERD (gastroesophageal reflux disease) 03/01/2012  . GOUT 10/19/2006  . HYPERLIPIDEMIA 10/19/2006  . HYPERTENSION 10/19/2006  . OSA (obstructive sleep apnea) 07/19/2015  . Prostate cancer ([place]) 01/19/2012  . SWELLING MASS OR LUMP IN HEAD AND NECK 01/09/2010   Past Surgical History:  Procedure Laterality Date  . APPENDECTOMY      reports that he has quit smoking. He has never used smokeless tobacco. He reports that he does not drink alcohol or use drugs. family history includes Cancer in an other family member; Hypertension in his mother. No Known Allergies Current Outpatient Medications on File Prior to Visit  Medication Sig Dispense Refill  . allopurinol (ZYLOPRIM) 300 MG tablet TAKE 1  TABLET DAILY 90 tablet 3  . aspirin 81 MG tablet Take 81 mg by mouth daily.      Marland Kitchen atorvastatin (LIPITOR) 80 MG tablet Take 1 tablet (80 mg total) by mouth daily. 90 tablet 3  . Continuous Blood Gluc Receiver (FREESTYLE LIBRE 14 DAY READER) DEVI Apply 1 Device topically daily. E11.9 1 Device 0  . fluticasone (FLONASE) 50 MCG/ACT nasal spray Place 1 spray into both nostrils daily.    Marland Kitchen FREESTYLE LITE test strip USE 1 STRIP AS NEEDED TO CHECK SUGAR TWICE A DAY 100 each 6  . glipiZIDE (GLUCOTROL XL) 10 MG 24 hr tablet TAKE 1 TABLET DAILY WITH BREAKFAST 90 tablet 3  . Lancets MISC Use asd 1 per day 250.02 100 each 11  . losartan-hydrochlorothiazide (HYZAAR) 50-12.5 MG tablet Take 1 tablet by mouth daily. 90 tablet 1  . metFORMIN (GLUCOPHAGE) 1000 MG tablet TAKE 1 TABLET TWICE A DAY WITH A MEAL 180 tablet 1  . montelukast (SINGULAIR) 10 MG tablet TAKE 1 TABLET (10MG  TOTAL ) BY MOUTH AT BEDTIME 90 tablet 2  . Multiple Vitamins-Minerals (CENTRUM MEN PO) Take 1 tablet by mouth daily.    . Omega-3 Fatty Acids (FISH OIL) 500 MG CAPS Take by mouth daily.      . tamsulosin (FLOMAX) 0.4 MG CAPS capsule Take 1 capsule (0.4 mg total) by mouth daily. 90 capsule 3  . vitamin B-12 (CYANOCOBALAMIN) 1000 MCG tablet Take 1,000 mcg by mouth daily.    . empagliflozin (JARDIANCE) 25 MG TABS tablet Take 25 mg by mouth daily. 90 tablet  3  . esomeprazole (NEXIUM) 40 MG capsule Take 40 mg by mouth daily at 12 noon.    . Fluticasone-Umeclidin-Vilant (TRELEGY ELLIPTA) 100-62.5-25 MCG/INH AEPB Inhale 1 puff into the lungs daily. 3 each 3  . metoprolol tartrate (LOPRESSOR) 50 MG tablet Take 1 tablet (50 mg) two hours prior to your CT appointment. (Patient not taking: Reported on 05/10/2019) 1 tablet 0  . sildenafil (VIAGRA) 100 MG tablet TAKE ONE-HALF (1/2) TO ONE TABLET (50 MG TO 100 MG) DAILY AS NEEDED FOR ERECTILE DYSFUNCTION 18 tablet 3   No current facility-administered medications on file prior to visit.   Review of  Systems All otherwise neg per pt     Objective:   Physical Exam BP 124/80 (BP Location: Left Arm, Patient Position: Sitting, Cuff Size: Normal)   Temp 97.6 F (36.4 C) (Oral)   Ht 5\' 5"  (1.651 m)   Wt 182 lb 6.4 oz (82.7 kg)   BMI 30.35 kg/m  VS noted,  Constitutional: Pt appears in NAD HENT: Head: NCAT.  Right Ear: External ear normal.  Left Ear: External ear normal.  Eyes: . Pupils are equal, round, and reactive to light. Conjunctivae and EOM are normal Nose: without d/c or deformity Neck: Neck supple. Gross normal ROM Cardiovascular: Normal rate and regular rhythm.   Pulmonary/Chest: Effort normal and breath sounds without rales or wheezing.  Abd:  Soft, NT, ND, + BS, no organomegaly Neurological: Pt is alert. At baseline orientation, motor grossly intact Skin: Skin is warm. No rashes, other new lesions, no LE edema Psychiatric: Pt behavior is normal without agitation  All otherwise neg per pt Lab Results  Component Value Date   WBC 4.3 05/10/2019   HGB 14.1 05/10/2019   HCT 44.2 05/10/2019   PLT 237.0 05/10/2019   GLUCOSE 115 (H) 05/10/2019   CHOL 182 05/10/2019   TRIG 83.0 05/10/2019   HDL 53.30 05/10/2019   LDLCALC 112 (H) 05/10/2019   ALT 21 05/10/2019   AST 15 05/10/2019   NA 141 05/10/2019   K 4.3 05/10/2019   CL 101 05/10/2019   CREATININE 1.12 05/10/2019   BUN 44 (H) 05/10/2019   CO2 32 05/10/2019   TSH 1.71 05/10/2019   PSA 3.70 05/10/2019   HGBA1C 9.1 (H) 05/10/2019   MICROALBUR 1.7 05/10/2019      Assessment & Plan:

## 2019-05-10 NOTE — Assessment & Plan Note (Signed)
stable overall by history and exam, recent data reviewed with pt, and pt to continue medical treatment as before,  to f/u any worsening symptoms or concerns  

## 2019-05-10 NOTE — Assessment & Plan Note (Signed)

## 2019-05-10 NOTE — Patient Instructions (Signed)
Please take all new medication as prescribed - the lactulose for constipation  Please continue all other medications as before, and refills have been done if requested.  Please have the pharmacy call with any other refills you may need.  Please continue your efforts at being more active, low cholesterol diet, and weight control.  You are otherwise up to date with prevention measures today.  Please keep your appointments with your specialists as you may have planned  Please go to the LAB at the blood drawing area for the tests to be done  You will be contacted by phone if any changes need to be made immediately.  Otherwise, you will receive a letter about your results with an explanation, but please check with MyChart first.  Please remember to sign up for MyChart if you have not done so, as this will be important to you in the future with finding out test results, communicating by private email, and scheduling acute appointments online when needed.  Please make an Appointment to return in 6 months, or sooner if needed

## 2019-05-15 ENCOUNTER — Ambulatory Visit: Payer: Medicare Other | Admitting: Cardiovascular Disease

## 2019-05-17 ENCOUNTER — Ambulatory Visit (INDEPENDENT_AMBULATORY_CARE_PROVIDER_SITE_OTHER): Payer: Medicare Other | Admitting: Internal Medicine

## 2019-05-17 ENCOUNTER — Encounter: Payer: Self-pay | Admitting: Internal Medicine

## 2019-05-17 ENCOUNTER — Other Ambulatory Visit: Payer: Self-pay

## 2019-05-17 VITALS — BP 138/66 | HR 110 | Temp 98.1°F | Ht 65.0 in | Wt 181.2 lb

## 2019-05-17 DIAGNOSIS — E119 Type 2 diabetes mellitus without complications: Secondary | ICD-10-CM | POA: Diagnosis not present

## 2019-05-17 DIAGNOSIS — I10 Essential (primary) hypertension: Secondary | ICD-10-CM | POA: Diagnosis not present

## 2019-05-17 DIAGNOSIS — E785 Hyperlipidemia, unspecified: Secondary | ICD-10-CM

## 2019-05-17 MED ORDER — FREESTYLE LIBRE 14 DAY SENSOR MISC: 1.0000 | Freq: Every day | 3 refills | Status: DC

## 2019-05-17 MED ORDER — FREESTYLE LIBRE 14 DAY READER DEVI: 1.0000 | Freq: Every day | 0 refills | Status: DC

## 2019-05-17 MED ORDER — TRULICITY 0.75 MG/0.5ML ~~LOC~~ SOAJ: 0.5000 mL | SUBCUTANEOUS | 3 refills | Status: DC

## 2019-05-17 NOTE — Patient Instructions (Signed)
Please take all new medication as prescribed  - the trulicty  Your new glucometer is also sent  Please continue all other medications as before, and refills have been done if requested.  Please have the pharmacy call with any other refills you may need.  Please continue your efforts at being more active, low cholesterol diabetic diet, and weight control.  Please keep your appointments with your specialists as you may have planned  Please make an Appointment to return in 6 months, or sooner if needed

## 2019-05-17 NOTE — Progress Notes (Signed)
Subjective:    Patient ID: Allen Dakin., male    DOB: January 16, 1941, 79 y.o.   MRN: [id number]  HPI Here to f/u; overall doing ok,  Pt denies chest pain, increasing sob or doe, wheezing, orthopnea, PND, increased LE swelling, palpitations, dizziness or syncope.  Pt denies new neurological symptoms such as new headache, or facial or extremity weakness or numbness.  Pt denies polydipsia, polyuria, or low sugar episode.  Pt states overall good compliance with meds, mostly trying to follow appropriate diet, with wt overall stable,  but little exercise however. Recetn a1c quite high, does not want to start insulin.  Asks for something else and new glucometer and will work on diet Past Medical History:  Diagnosis Date  . ALLERGIC RHINITIS 10/04/2008  . ASTHMA 10/04/2008  . Asthma   . BACK PAIN 04/18/2008  . BENIGN PROSTATIC HYPERTROPHY 03/09/2007  . COLONIC POLYPS, HX OF 03/09/2007  . COPD 10/20/2007  . DIABETES MELLITUS, TYPE II 03/09/2007  . FATIGUE 10/05/2007  . GERD (gastroesophageal reflux disease) 03/01/2012  . GOUT 10/19/2006  . HYPERLIPIDEMIA 10/19/2006  . HYPERTENSION 10/19/2006  . OSA (obstructive sleep apnea) 07/19/2015  . Prostate cancer ([place]) 01/19/2012  . SWELLING MASS OR LUMP IN HEAD AND NECK 01/09/2010   Past Surgical History:  Procedure Laterality Date  . APPENDECTOMY      reports that he has quit smoking. He has never used smokeless tobacco. He reports that he does not drink alcohol or use drugs. family history includes Cancer in an other family member; Hypertension in his mother. No Known Allergies Current Outpatient Medications on File Prior to Visit  Medication Sig Dispense Refill  . allopurinol (ZYLOPRIM) 300 MG tablet TAKE 1 TABLET DAILY 90 tablet 3  . aspirin 81 MG tablet Take 81 mg by mouth daily.      Marland Kitchen atorvastatin (LIPITOR) 80 MG tablet Take 1 tablet (80 mg total) by mouth daily. 90 tablet 3  . empagliflozin (JARDIANCE) 25 MG TABS tablet Take 25 mg by mouth daily.  90 tablet 3  . esomeprazole (NEXIUM) 40 MG capsule Take 40 mg by mouth daily at 12 noon.    . fluticasone (FLONASE) 50 MCG/ACT nasal spray Place 1 spray into both nostrils daily.    . Fluticasone-Umeclidin-Vilant (TRELEGY ELLIPTA) 100-62.5-25 MCG/INH AEPB Inhale 1 puff into the lungs daily. 3 each 3  . glipiZIDE (GLUCOTROL XL) 10 MG 24 hr tablet TAKE 1 TABLET DAILY WITH BREAKFAST 90 tablet 3  . Lactulose 20 GM/30ML SOLN 30 ml every 8 hrs as needed 450 mL 5  . Lancets MISC Use asd 1 per day 250.02 100 each 11  . losartan-hydrochlorothiazide (HYZAAR) 50-12.5 MG tablet Take 1 tablet by mouth daily. 90 tablet 1  . metFORMIN (GLUCOPHAGE) 1000 MG tablet TAKE 1 TABLET TWICE A DAY WITH A MEAL 180 tablet 1  . metoprolol tartrate (LOPRESSOR) 50 MG tablet Take 1 tablet (50 mg) two hours prior to your CT appointment. 1 tablet 0  . montelukast (SINGULAIR) 10 MG tablet TAKE 1 TABLET (10MG  TOTAL ) BY MOUTH AT BEDTIME 90 tablet 2  . Multiple Vitamins-Minerals (CENTRUM MEN PO) Take 1 tablet by mouth daily.    . Omega-3 Fatty Acids (FISH OIL) 500 MG CAPS Take by mouth daily.      . sildenafil (VIAGRA) 100 MG tablet TAKE ONE-HALF (1/2) TO ONE TABLET (50 MG TO 100 MG) DAILY AS NEEDED FOR ERECTILE DYSFUNCTION 18 tablet 3  . tamsulosin (FLOMAX) 0.4 MG CAPS capsule  Take 1 capsule (0.4 mg total) by mouth daily. 90 capsule 3  . vitamin B-12 (CYANOCOBALAMIN) 1000 MCG tablet Take 1,000 mcg by mouth daily.     No current facility-administered medications on file prior to visit.   Review of Systems All otherwise neg per pt     Objective:   Physical Exam BP 138/66 (BP Location: Left Arm, Patient Position: Sitting, Cuff Size: Normal)   Pulse (!) 110   Temp 98.1 F (36.7 C) (Oral)   Ht 5\' 5"  (1.651 m)   Wt 181 lb 3.2 oz (82.2 kg)   SpO2 95%   BMI 30.15 kg/m  VS noted,  Constitutional: Pt appears in NAD HENT: Head: NCAT.  Right Ear: External ear normal.  Left Ear: External ear normal.  Eyes: . Pupils are  equal, round, and reactive to light. Conjunctivae and EOM are normal Nose: without d/c or deformity Neck: Neck supple. Gross normal ROM Cardiovascular: Normal rate and regular rhythm.   Pulmonary/Chest: Effort normal and breath sounds without rales or wheezing.  Abd:  Soft, NT, ND, + BS, no organomegaly Neurological: Pt is alert. At baseline orientation, motor grossly intact Skin: Skin is warm. No rashes, other new lesions, no LE edema Psychiatric: Pt behavior is normal without agitation  All otherwise neg per pt  Lab Results  Component Value Date   WBC 4.3 05/10/2019   HGB 14.1 05/10/2019   HCT 44.2 05/10/2019   PLT 237.0 05/10/2019   GLUCOSE 115 (H) 05/10/2019   CHOL 182 05/10/2019   TRIG 83.0 05/10/2019   HDL 53.30 05/10/2019   LDLCALC 112 (H) 05/10/2019   ALT 21 05/10/2019   AST 15 05/10/2019   NA 141 05/10/2019   K 4.3 05/10/2019   CL 101 05/10/2019   CREATININE 1.12 05/10/2019   BUN 44 (H) 05/10/2019   CO2 32 05/10/2019   TSH 1.71 05/10/2019   PSA 3.70 05/10/2019   HGBA1C 9.1 (H) 05/10/2019   MICROALBUR 1.7 05/10/2019        Assessment & Plan:

## 2019-05-17 NOTE — Assessment & Plan Note (Addendum)
Uncontrlolled, ok for trulicity asd, and libre glucometer with sensors, call for glc persistent > 200  I spent 32 minutes  in preparing to see the patient by review of recent labs, imaging and procedures, obtaining and reviewing separately obtained history, communicating with the patient and family or caregiver, ordering medications, tests or procedures, and documenting clinical information in the EHR including the differential Dx, treatment, and any further evaluation and other management of uncontrolled DM, HTn, HLD

## 2019-05-17 NOTE — Assessment & Plan Note (Signed)
stable overall by history and exam, recent data reviewed with pt, and pt to continue medical treatment as before,  to f/u any worsening symptoms or concerns  

## 2019-05-18 ENCOUNTER — Ambulatory Visit: Payer: Medicare Other | Admitting: Internal Medicine

## 2019-05-23 ENCOUNTER — Other Ambulatory Visit: Payer: Self-pay

## 2019-05-23 ENCOUNTER — Ambulatory Visit (INDEPENDENT_AMBULATORY_CARE_PROVIDER_SITE_OTHER): Payer: Medicare Other | Admitting: Cardiovascular Disease

## 2019-05-23 ENCOUNTER — Encounter: Payer: Self-pay | Admitting: Cardiovascular Disease

## 2019-05-23 VITALS — BP 100/56 | HR 104 | Ht 65.0 in | Wt 181.0 lb

## 2019-05-23 DIAGNOSIS — J449 Chronic obstructive pulmonary disease, unspecified: Secondary | ICD-10-CM | POA: Diagnosis not present

## 2019-05-23 DIAGNOSIS — I10 Essential (primary) hypertension: Secondary | ICD-10-CM | POA: Diagnosis not present

## 2019-05-23 DIAGNOSIS — I5032 Chronic diastolic (congestive) heart failure: Secondary | ICD-10-CM | POA: Diagnosis not present

## 2019-05-23 DIAGNOSIS — I491 Atrial premature depolarization: Secondary | ICD-10-CM | POA: Diagnosis not present

## 2019-05-23 MED ORDER — BISOPROLOL FUMARATE 5 MG PO TABS: 5.0000 mg | ORAL_TABLET | Freq: Every day | ORAL | 11 refills | Status: DC

## 2019-05-23 NOTE — Progress Notes (Signed)
Cardiology Office Note:    Date:  05/23/2019   ID:  Allen Hunter., DOB February 08, 1941, MRN [id number]  PCP:  [name], MD  Cardiologist:  Katiejo Gilroy  Electrophysiologist:  None   Referring MD: [name], MD   Chief Complaint  Patient presents with  . Hyperlipidemia     Aug. 25, 2020    Allen Hunter. is a 79 y.o. male with a hx of COPD  ,HTN.  OSA. I met him in April during a telemedicine visit .  Has been eating more canned foods and lots of salty , preserved meats ,  ( [place] sausage,  Spam, bologna, package ham,  [place],  Hot dogs )   he is seen back today for follow up visit Echo from May 23, 2018 revealed normal LV systolic function,  Grade 1 diastolic dysfunction  Seeing him in person for the first time today - first visit was telemedicine     Overall feels well.   Has some DOE , DOE has worsened over the past 18 months .  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   May 23, 2019:  Allen Hunter is seen today for follow-up of his COPD, hypertension, obstructive sleep apnea. Coronary CT angiogram revealed tiny bilateral solid and groundglass pulmonary nodules similar to his previous CT scan performed March, 2020.  They recommended follow-up in 6 to 12 months.  Coronary evaluation revealed a coronary calcium score of 45 which is 14th percentile for age and gender matched controls.  His LAD has very minor irregularities.  Left circumflex artery had only luminal irregularities.  The right coronary artery had only minor luminal irregularities.  Echocardiogram from March, 2020 reveals normal left ventricular systolic function.  He has grade 1 diastolic dysfunction.  No significant valvular abnormalities.  No CP ,  Breathing is unchange.   Does not get any exercise   Avoids salt.     Past Medical History:  Diagnosis Date  . ALLERGIC RHINITIS 10/04/2008  . ASTHMA 10/04/2008  . Asthma    . BACK PAIN 04/18/2008  . BENIGN PROSTATIC HYPERTROPHY 03/09/2007  . COLONIC POLYPS, HX OF 03/09/2007  . COPD 10/20/2007  . DIABETES MELLITUS, TYPE II 03/09/2007  . FATIGUE 10/05/2007  . GERD (gastroesophageal reflux disease) 03/01/2012  . GOUT 10/19/2006  . HYPERLIPIDEMIA 10/19/2006  . HYPERTENSION 10/19/2006  . OSA (obstructive sleep apnea) 07/19/2015  . Prostate cancer ([place]) 01/19/2012  . SWELLING MASS OR LUMP IN HEAD AND NECK 01/09/2010    Past Surgical History:  Procedure Laterality Date  . APPENDECTOMY      Current Medications: Current Meds  Medication Sig  . allopurinol (ZYLOPRIM) 300 MG tablet TAKE 1 TABLET DAILY  . aspirin 81 MG tablet Take 81 mg by mouth daily.    Allen Kitchen atorvastatin (LIPITOR) 80 MG tablet Take 1 tablet (80 mg total) by mouth daily.  . Continuous Blood Gluc Sensor (FREESTYLE LIBRE 14 DAY SENSOR) MISC Apply 1 Device topically daily. E11.9  . Dulaglutide (TRULICITY) 1.19 ER/7.4YC SOPN Inject 0.5 mLs into the skin once a week.  . empagliflozin (JARDIANCE) 25 MG TABS tablet Take 25 mg by mouth daily.  Allen Kitchen esomeprazole (NEXIUM) 40 MG capsule Take 40 mg by mouth daily at 12 noon.  . fluticasone (FLONASE) 50 MCG/ACT nasal spray Place 1 spray into both nostrils daily.  . Fluticasone-Umeclidin-Vilant (TRELEGY ELLIPTA) 100-62.5-25 MCG/INH  AEPB Inhale 1 puff into the lungs daily.  Allen Kitchen glipiZIDE (GLUCOTROL XL) 10 MG 24 hr tablet TAKE 1 TABLET DAILY WITH BREAKFAST  . Lactulose 20 GM/30ML SOLN 30 ml every 8 hrs as needed  . Lancets MISC Use asd 1 per day 250.02  . metFORMIN (GLUCOPHAGE) 1000 MG tablet TAKE 1 TABLET TWICE A DAY WITH A MEAL  . montelukast (SINGULAIR) 10 MG tablet TAKE 1 TABLET (10MG TOTAL ) BY MOUTH AT BEDTIME  . Multiple Vitamins-Minerals (CENTRUM MEN PO) Take 1 tablet by mouth daily.  . Omega-3 Fatty Acids (FISH OIL) 500 MG CAPS Take by mouth daily.    . sildenafil (VIAGRA) 100 MG tablet TAKE ONE-HALF (1/2) TO ONE TABLET (50 MG TO 100 MG) DAILY AS NEEDED FOR  ERECTILE DYSFUNCTION  . tamsulosin (FLOMAX) 0.4 MG CAPS capsule Take 1 capsule (0.4 mg total) by mouth daily.  . vitamin B-12 (CYANOCOBALAMIN) 1000 MCG tablet Take 1,000 mcg by mouth daily.  . [DISCONTINUED] losartan-hydrochlorothiazide (HYZAAR) 50-12.5 MG tablet Take 1 tablet by mouth daily.     Allergies:   Patient has no known allergies.   Social History   Socioeconomic History  . Marital status: Single    Spouse name: Not on file  . Number of children: Not on file  . Years of education: Not on file  . Highest education level: Not on file  Occupational History  . Occupation: retired [employer] aug 2009  Tobacco Use  . Smoking status: Former Research scientist (life sciences)  . Smokeless tobacco: Never Used  Substance and Sexual Activity  . Alcohol use: No  . Drug use: Never  . Sexual activity: Not Currently  Other Topics Concern  . Not on file  Social History Narrative  . Not on file   Social Determinants of Health   Financial Resource Strain:   . Difficulty of Paying Living Expenses: Not on file  Food Insecurity:   . Worried About Charity fundraiser in the Last Year: Not on file  . Ran Out of Food in the Last Year: Not on file  Transportation Needs:   . Lack of Transportation (Medical): Not on file  . Lack of Transportation (Non-Medical): Not on file  Physical Activity: Insufficiently Active  . Days of Exercise per Week: 3 days  . Minutes of Exercise per Session: 40 min  Stress:   . Feeling of Stress : Not on file  Social Connections:   . Frequency of Communication with Friends and Family: Not on file  . Frequency of Social Gatherings with Friends and Family: Not on file  . Attends Religious Services: Not on file  . Active Member of Clubs or Organizations: Not on file  . Attends Archivist Meetings: Not on file  . Marital Status: Not on file     Family History: The patient's family history includes Cancer in an other family member; Hypertension in his mother.  ROS:    Please see the history of present illness.     All other systems reviewed and are negative.  EKGs/Labs/Other Studies Reviewed:    The following studies were reviewed today:   EKG:  Aug. 25, 2020: NSR at 65.   No ST or T wave abn.    Recent Labs: 05/10/2019: ALT 21; BUN 44; Creatinine, Ser 1.12; Hemoglobin 14.1; Platelets 237.0; Potassium 4.3; Sodium 141; TSH 1.71  Recent Lipid Panel    Component Value Date/Time   CHOL 182 05/10/2019 1444   TRIG 83.0 05/10/2019 1444  TRIG 49 03/30/2006 1021   HDL 53.30 05/10/2019 1444   CHOLHDL 3 05/10/2019 1444   VLDL 16.6 05/10/2019 1444   LDLCALC 112 (H) 05/10/2019 1444    Physical Exam:    Physical Exam: Blood pressure (!) 100/56, pulse (!) 104, height '5\' 5"'  (1.651 m), weight 181 lb (82.1 kg), SpO2 97 %.  GEN:  Well nourished, well developed in no acute distress HEENT: Normal NECK: No JVD; No carotid bruits LYMPHATICS: No lymphadenopathy CARDIAC: RR with frequent premature beats  RESPIRATORY:  Clear to auscultation without rales, wheezing or rhonchi  ABDOMEN: Soft, non-tender, non-distended MUSCULOSKELETAL:  No edema; No deformity  SKIN: Warm and dry NEUROLOGIC:  Alert and oriented x 3   ASSESSMENT:    1. Chronic diastolic CHF (congestive heart failure) ([place])   2. Chronic obstructive pulmonary disease, unspecified COPD type ([place])   3. Essential hypertension   4. PAC (premature atrial contraction)    PLAN:    In order of problems listed above:  1. DOE:    He does not have any significant coronary artery disease to explain his shortness of breath.  He does have grade 1 diastolic dysfunction that may be contributing to his shortness of breath but I doubt it is the main cause.  The CT scan revealed some groundglass opacities in his lungs that were unchanged from his previous CT scan.  I will have his primary medical doctor address these.  2.  Premature ventricular contractions :      Medication Adjustments/Labs and Tests  Ordered: Current medicines are reviewed at length with the patient today.  Concerns regarding medicines are outlined above.  Orders Placed This Encounter  Procedures  . EKG 12-Lead   Meds ordered this encounter  Medications  . bisoprolol (ZEBETA) 5 MG tablet    Sig: Take 1 tablet (5 mg total) by mouth daily.    Dispense:  30 tablet    Refill:  11    Patient Instructions  Medication Instructions:  Your physician has recommended you make the following change in your medication:  STOP Hyzaar (Losartan-HCTZ) START Bisoprolol (Zebeta) 5 mg once daily  *If you need a refill on your cardiac medications before your next appointment, please call your pharmacy*   Lab Work: None Ordered If you have labs (blood work) drawn today and your tests are completely normal, you will receive your results only by: Allen Kitchen MyChart Message (if you have MyChart) OR . A paper copy in the mail If you have any lab test that is abnormal or we need to change your treatment, we will call you to review the results.   Testing/Procedures: None Ordered   Follow-Up: At [facility], you and your health needs are our priority.  As part of our continuing mission to provide you with exceptional heart care, we have created designated Provider Care Teams.  These Care Teams include your primary Cardiologist (physician) and Advanced Practice Providers (APPs -  Physician Assistants and Nurse Practitioners) who all work together to provide you with the care you need, when you need it.   Your next appointment:   3 month(s) on Friday June 4 at 2:45 pm  The format for your next appointment:   In Person  Provider:   Robbie Lis, PA-C        Signed, Mertie Moores, MD  05/23/2019 6:27 PM    [place]

## 2019-05-23 NOTE — Patient Instructions (Signed)
Medication Instructions:  Your physician has recommended you make the following change in your medication:  STOP Hyzaar (Losartan-HCTZ) START Bisoprolol (Zebeta) 5 mg once daily  *If you need a refill on your cardiac medications before your next appointment, please call your pharmacy*   Lab Work: None Ordered If you have labs (blood work) drawn today and your tests are completely normal, you will receive your results only by: Marland Kitchen MyChart Message (if you have MyChart) OR . A paper copy in the mail If you have any lab test that is abnormal or we need to change your treatment, we will call you to review the results.   Testing/Procedures: None Ordered   Follow-Up: At [facility], you and your health needs are our priority.  As part of our continuing mission to provide you with exceptional heart care, we have created designated Provider Care Teams.  These Care Teams include your primary Cardiologist (physician) and Advanced Practice Providers (APPs -  Physician Assistants and Nurse Practitioners) who all work together to provide you with the care you need, when you need it.   Your next appointment:   3 month(s) on Friday June 4 at 2:45 pm  The format for your next appointment:   In Person  Provider:   Robbie Lis, PA-C

## 2019-05-26 ENCOUNTER — Telehealth: Payer: Self-pay

## 2019-05-26 NOTE — Telephone Encounter (Signed)
Key: [facility]  This medication may be excluded from the patient's benefit.   Pt informed of denial.

## 2019-06-07 ENCOUNTER — Other Ambulatory Visit: Payer: Self-pay | Admitting: *Deleted

## 2019-06-07 NOTE — Patient Outreach (Signed)
[facility]) Care Management  06/07/2019  Rangel Cannada. May 13, 1940 [id number]   Successful telephone outreach call to patient. HIPAA identifiers obtained. Nurse reviewed [facility] program and resources and patient stated that he would like to participate in the program.   Plan: Nurse will send a Health Coach letter and will call patient to do the initial assessment within the next couple of weeks.  [name] RN, BSN [place] [phone] [email or web address]

## 2019-06-09 ENCOUNTER — Other Ambulatory Visit: Payer: Self-pay | Admitting: Internal Medicine

## 2019-06-09 DIAGNOSIS — E119 Type 2 diabetes mellitus without complications: Secondary | ICD-10-CM

## 2019-06-09 NOTE — Telephone Encounter (Signed)
Please refill as per office routine med refill policy (all routine meds refilled for 3 mo or monthly per pt preference up to one year from last visit, then month to month grace period for 3 mo, then further med refills will have to be denied)  

## 2019-06-12 ENCOUNTER — Telehealth: Payer: Self-pay

## 2019-06-12 NOTE — Telephone Encounter (Signed)
Patient calling and states that he would like Dr Jenny Reichmann to know that since his last OV (05/17/2019) he has not taken his insulin. States that he has went on a diet and his blood glucose monitor at home has not read over 140 for any of his reading. States that he just could stand the thought of jabbing himself with a needle.  CB#: [phone]

## 2019-06-13 NOTE — Telephone Encounter (Signed)
F/u   The patient wanted Dr. Jenny Reichmann to know he has not taken insulin that was prescribe due to a decrease in weight and decrease blood sugar reading has not read over 140.

## 2019-06-15 ENCOUNTER — Other Ambulatory Visit: Payer: Self-pay | Admitting: *Deleted

## 2019-06-15 ENCOUNTER — Encounter: Payer: Self-pay | Admitting: *Deleted

## 2019-06-15 NOTE — Patient Outreach (Signed)
[facility]) Care Management  06/15/2019   Allen Hunter. [birth year] [id number]   Social: Patient lives alone. He is independent with all ADLs, IADLs, and currently transports himself to all of his medical appointments. Patient denies having any recent falls and reports that his home environment is safe. His cousin  May would be able to provide care for the patient if the they were to need assistance and emotionally the patient is doing well at this time.  Subjective: Patient reports that he is very motivated to get his diabetes under control. Explaining that recently his PCP  prescribed Trulicity due to his 123456 increasing to 9.1. Allen Hunter called PCP and stated that he did not want to have to inject medication and asked if he could try to decrease his A1c by improving his diet. Since then patient has started the Nutrisystem diabetes diet plan and explains he has noticed an improvement in his CBG ranges. Patient's FBS this morning was 140 and he states his ranges are 100-140. He expressed desire to increase his physical activity by starting to walk at the gym at least twice weekly and he does do his own yard work. Allen Hunter states he does need diabetes education especially diet education to help him maintain his diabetic health after he stops the Nutrisystem program.  Interventions: RN did review diabetes diet, diabetes medications, and diabetes health maintenance with the patient and praised him for his motivation for improving his health and wellness.   Current Medications:  Current Outpatient Medications  Medication Sig Dispense Refill  . allopurinol (ZYLOPRIM) 300 MG tablet TAKE 1 TABLET DAILY 90 tablet 3  . aspirin 81 MG tablet Take 81 mg by mouth daily.      Marland Kitchen atorvastatin (LIPITOR) 80 MG tablet Take 1 tablet (80 mg total) by mouth daily. 90 tablet 3  . bisoprolol (ZEBETA) 5 MG tablet Take 1 tablet (5 mg total) by mouth daily. 30 tablet 11  . empagliflozin (JARDIANCE) 25  MG TABS tablet Take 25 mg by mouth daily. 90 tablet 3  . esomeprazole (NEXIUM) 40 MG capsule Take 40 mg by mouth daily at 12 noon.    . fluticasone (FLONASE) 50 MCG/ACT nasal spray Place 1 spray into both nostrils daily.    . Fluticasone-Umeclidin-Vilant (TRELEGY ELLIPTA) 100-62.5-25 MCG/INH AEPB Inhale 1 puff into the lungs daily. 3 each 3  . glipiZIDE (GLUCOTROL XL) 10 MG 24 hr tablet TAKE 1 TABLET DAILY WITH BREAKFAST 90 tablet 3  . Lactulose 20 GM/30ML SOLN 30 ml every 8 hrs as needed 450 mL 5  . Lancets MISC Use asd 1 per day 250.02 100 each 11  . metFORMIN (GLUCOPHAGE) 1000 MG tablet TAKE 1 TABLET TWICE A DAY WITH A MEAL 180 tablet 2  . montelukast (SINGULAIR) 10 MG tablet TAKE 1 TABLET (10MG  TOTAL ) BY MOUTH AT BEDTIME 90 tablet 2  . Multiple Vitamins-Minerals (CENTRUM MEN PO) Take 1 tablet by mouth daily.    . Omega-3 Fatty Acids (FISH OIL) 500 MG CAPS Take by mouth daily.      . sildenafil (VIAGRA) 100 MG tablet TAKE ONE-HALF (1/2) TO ONE TABLET (50 MG TO 100 MG) DAILY AS NEEDED FOR ERECTILE DYSFUNCTION 18 tablet 3  . tamsulosin (FLOMAX) 0.4 MG CAPS capsule Take 1 capsule (0.4 mg total) by mouth daily. 90 capsule 3  . trospium (SANCTURA) 20 MG tablet Take 20 mg by mouth 2 (two) times daily.    . vitamin B-12 (CYANOCOBALAMIN) 1000 MCG  tablet Take 1,000 mcg by mouth daily.    . Dulaglutide (TRULICITY) A999333 0000000 SOPN Inject 0.5 mLs into the skin once a week. (Patient not taking: Reported on 06/15/2019) 6 mL 3   No current facility-administered medications for this visit.    Functional Status:  In your present state of health, do you have any difficulty performing the following activities: 06/15/2019 08/02/2018  Hearing? N N  Vision? N N  Difficulty concentrating or making decisions? N N  Walking or climbing stairs? N N  Dressing or bathing? N N  Doing errands, shopping? N N  Preparing Food and eating ? N N  Using the Toilet? N N  In the past six months, have you accidently leaked  urine? Y Y  Comment Hx. of prostate cancer followed by Dr. Ivery Quale followed by urology  Do you have problems with loss of bowel control? N N  Managing your Medications? N N  Managing your Finances? N N  Housekeeping or managing your Housekeeping? N N  Some recent data might be hidden    Fall/Depression Screening: Fall Risk  06/15/2019 05/10/2019 05/10/2019  Falls in the past year? 0 0 0  Number falls in past yr: 0 - -  Injury with Fall? 0 - -  Follow up Falls prevention discussed;Education provided;Falls evaluation completed - -   PHQ 2/9 Scores 06/15/2019 05/10/2019 05/10/2019 08/02/2018 11/17/2017 07/22/2017 05/18/2017  PHQ - 2 Score 0 0 0 0 0 1 0  PHQ- 9 Score - - - - - 1 -   THN CM Care Plan Problem One     Most Recent Value  Care Plan Problem One  Knowledge Deficit of diabetes control related elevated A1c- 9%  Role Documenting the Problem One  [place] for Problem One  Active  THN Long Term Goal   Patient will report reduction in A1c by 0.5-1 points in the next 90 days.  THN Long Term Goal Start Date  06/15/19  Interventions for Problem One Long Term Goal  RN discussed importance of monitoring sugar and counting carbohydrates, will send EMMI diabetes meal plan, encouraged patient to continue with daily FBS and HS CBG monitoring and record data, encouraged medication adherence, discussed reflecting back to diet when FBS values are high, sent Diabetes information packet  THN CM Short Term Goal #1   Patient will increase his physical activity by going to the gym x2 weekly  Interventions for Short Term Goal #1  RN reviewed that exercise helps to lower glucose levels and is heart healthy, Patient discussed desire of increasing physical activity and patient states he will maintain his yard and walk at the gym x2 weekly     Plan:  Allen Hunter will send introductory letters to the patient and the provider and will send patient diabetes education material. [place] will call  patient within the month of April and patient agrees to future outreach calls.   [name] RN, BSN [place] [phone] [email or web address]

## 2019-06-19 DIAGNOSIS — R0600 Dyspnea, unspecified: Secondary | ICD-10-CM | POA: Diagnosis not present

## 2019-06-19 DIAGNOSIS — E119 Type 2 diabetes mellitus without complications: Secondary | ICD-10-CM | POA: Diagnosis not present

## 2019-06-19 DIAGNOSIS — G4733 Obstructive sleep apnea (adult) (pediatric): Secondary | ICD-10-CM | POA: Diagnosis not present

## 2019-06-19 DIAGNOSIS — J439 Emphysema, unspecified: Secondary | ICD-10-CM | POA: Diagnosis not present

## 2019-06-26 ENCOUNTER — Other Ambulatory Visit: Payer: Self-pay

## 2019-06-26 MED ORDER — BISOPROLOL FUMARATE 5 MG PO TABS: 5.0000 mg | ORAL_TABLET | Freq: Every day | ORAL | 3 refills | Status: DC

## 2019-07-13 ENCOUNTER — Other Ambulatory Visit: Payer: Self-pay | Admitting: *Deleted

## 2019-07-13 NOTE — Patient Outreach (Addendum)
[facility]) Care Management  07/13/2019  [name]. 01-30-1941 [id number]   Successful telephone outreach call to patient. HIPAA identifiers obtained. Patient answered phone and explained that today would not be a good day to speak with nurse. He did report that he received nurse's educational material on diabetes and he does have some questions and would like to review the material with nurse. He asked the nurse to call him back at a later date.   Plan: RN Health Coach will call patient within the next few weeks and patient agrees to future outreach calls.   [name] RN, BSN [place] [phone] [email or web address]

## 2019-07-18 ENCOUNTER — Other Ambulatory Visit: Payer: Self-pay | Admitting: *Deleted

## 2019-07-18 NOTE — Patient Outreach (Signed)
[place] [facility]) Care Management  [place]  07/18/2019   Terrin Ditman. [birth year] [id number]  Subjective: Successful telephone outreach call to patient. HIPAA identifiers obtained. Patient states he has been feeling good. He explained that he is not doing the Nutri-system diabetic diet plan any longer and as a result he noticed that his FBS is higher. This morning it was 149 with ranges within the last several weeks of 120 - >200. Patient reports that he is not exercising regularly but does plan to start to go back to the gym routinely and walk; he does do his own yard work. Patient states that he is trying to get his A1c down as much as possible before he sees his PCP in August. He is trying to avoid having to start any injectable medication which was mentioned to him after his last PCP visit. He explained that he wanted to review the diabetic packet information with nurse today. Mr. Trayer did not have any further questions or concerns. He stated he would continue to review the diabetic information and would call nurse if he had any questions.   Interventions:  Nurse and patient read much of the diabetic education information together. Nurse provided education and the patient ask questions as needed.   Encounter Medications:  Outpatient Encounter Medications as of 07/18/2019  Medication Sig Note  . allopurinol (ZYLOPRIM) 300 MG tablet TAKE 1 TABLET DAILY   . aspirin 81 MG tablet Take 81 mg by mouth daily.     Marland Kitchen atorvastatin (LIPITOR) 80 MG tablet Take 1 tablet (80 mg total) by mouth daily.   . bisoprolol (ZEBETA) 5 MG tablet Take 1 tablet (5 mg total) by mouth daily.   . Dulaglutide (TRULICITY) A999333 0000000 SOPN Inject 0.5 mLs into the skin once a week. (Patient not taking: Reported on 06/15/2019) 06/15/2019: Patient did not want to take medication  . empagliflozin (JARDIANCE) 25 MG TABS tablet Take 25 mg by mouth daily.   Marland Kitchen esomeprazole (NEXIUM) 40 MG capsule Take 40 mg  by mouth daily at 12 noon.   . fluticasone (FLONASE) 50 MCG/ACT nasal spray Place 1 spray into both nostrils daily.   . Fluticasone-Umeclidin-Vilant (TRELEGY ELLIPTA) 100-62.5-25 MCG/INH AEPB Inhale 1 puff into the lungs daily.   Marland Kitchen glipiZIDE (GLUCOTROL XL) 10 MG 24 hr tablet TAKE 1 TABLET DAILY WITH BREAKFAST   . Lactulose 20 GM/30ML SOLN 30 ml every 8 hrs as needed   . Lancets MISC Use asd 1 per day 250.02   . metFORMIN (GLUCOPHAGE) 1000 MG tablet TAKE 1 TABLET TWICE A DAY WITH A MEAL   . montelukast (SINGULAIR) 10 MG tablet TAKE 1 TABLET (10MG  TOTAL ) BY MOUTH AT BEDTIME   . Multiple Vitamins-Minerals (CENTRUM MEN PO) Take 1 tablet by mouth daily.   . Omega-3 Fatty Acids (FISH OIL) 500 MG CAPS Take by mouth daily.     . sildenafil (VIAGRA) 100 MG tablet TAKE ONE-HALF (1/2) TO ONE TABLET (50 MG TO 100 MG) DAILY AS NEEDED FOR ERECTILE DYSFUNCTION   . tamsulosin (FLOMAX) 0.4 MG CAPS capsule Take 1 capsule (0.4 mg total) by mouth daily.   . trospium (SANCTURA) 20 MG tablet Take 20 mg by mouth 2 (two) times daily.   . vitamin B-12 (CYANOCOBALAMIN) 1000 MCG tablet Take 1,000 mcg by mouth daily.    No facility-administered encounter medications on file as of 07/18/2019.    Functional Status:  In your present state of health, do you have any  difficulty performing the following activities: 06/15/2019 08/02/2018  Hearing? N N  Vision? N N  Difficulty concentrating or making decisions? N N  Walking or climbing stairs? N N  Dressing or bathing? N N  Doing errands, shopping? N N  Preparing Food and eating ? N N  Using the Toilet? N N  In the past six months, have you accidently leaked urine? Y Y  Comment Hx. of prostate cancer followed by Dr. Ivery Quale followed by urology  Do you have problems with loss of bowel control? N N  Managing your Medications? N N  Managing your Finances? N N  Housekeeping or managing your Housekeeping? N N  Some recent data might be hidden    Fall/Depression  Screening: Fall Risk  07/18/2019 06/15/2019 05/10/2019  Falls in the past year? 0 0 0  Number falls in past yr: 0 0 -  Injury with Fall? 0 0 -  Follow up Falls prevention discussed;Education provided;Falls evaluation completed Falls prevention discussed;Education provided;Falls evaluation completed -   PHQ 2/9 Scores 06/15/2019 05/10/2019 05/10/2019 08/02/2018 11/17/2017 07/22/2017 05/18/2017  PHQ - 2 Score 0 0 0 0 0 1 0  PHQ- 9 Score - - - - - 1 -   THN CM Care Plan Problem One     Most Recent Value  Care Plan Problem One  Knowledge deficient related to diabetes condition, treatment plan, and lifestyle changes.  Role Documenting the Problem One  [place] for Problem One  Active  [facility] Long Term Goal   Patient will report reduction in A1c by 0.5-1 points in the next 90 days.  THN Long Term Goal Start Date  06/15/19  Interventions for Problem One Long Term Goal  Patient and nurse reviewed previously sent diabetes packet education together during this call, RN discussed importance of monitoring sugar and counting carbohydrates, encouraged patient to continue with daily FBS and HS CBG monitoring and record data, encouraged medication adherence, discussed reflecting back to diet when BS values are high, sent calendar booklet to record values, sent educational EMMIs both printed and video.  THN CM Short Term Goal #1   Patient will increase his physical activity by going to the gym x2 weekly within the next 30 days  THN CM Short Term Goal #1 Start Date  07/18/19  Interventions for Short Term Goal #1  RN reviewed that exercise helps to lower glucose levels and is heart healthy, encouraged patient to increase physical activity to 30 minutes daily, Patient states he will start to go to the gym and walk and he will maintian his yard.  THN CM Short Term Goal #2   Patient will start to count carbohydrates each meal within the next 30 days  THN CM Short Term Goal #2 Start Date  07/18/19  Interventions  for Short Term Goal #2  RN reviewed with patient how to read food labels, discussed that limiting carbohydrates to 50 grams or less per meal and 25 grams per snack is an excellent way to keep blood sugars in a good range, patient stated he thought that was a great idea and explained he would start to count the carbohydrates       Plan: Nurse will send patient EMMI diabetic education both videos and printed, a calendar booklet to record his daily FBS, and information regarding [place] diabetes support group. RN Health Coach will call patient within the month of May and patient agrees to future outreach calls.   [name] RN,  BSN [place] [phone] [email or web address]

## 2019-08-05 ENCOUNTER — Other Ambulatory Visit: Payer: Self-pay | Admitting: Internal Medicine

## 2019-08-08 ENCOUNTER — Ambulatory Visit (INDEPENDENT_AMBULATORY_CARE_PROVIDER_SITE_OTHER): Payer: Medicare Other

## 2019-08-08 ENCOUNTER — Other Ambulatory Visit: Payer: Self-pay

## 2019-08-08 VITALS — BP 120/70 | HR 71 | Temp 98.1°F | Resp 16 | Ht 65.0 in | Wt 178.6 lb

## 2019-08-08 DIAGNOSIS — Z0000 Encounter for general adult medical examination without abnormal findings: Secondary | ICD-10-CM

## 2019-08-08 NOTE — Patient Instructions (Addendum)
[name] , Thank you for taking time to come for your Medicare Wellness Visit. I appreciate your ongoing commitment to your health goals. Please review the following plan we discussed and let me know if I can assist you in the future.   Screening recommendations/referrals: Colorectal Screening: not recommended due to age  79 and Dental Exams: Recommended annual ophthalmology exams for early detection of glaucoma and other disorders of the eye Recommended annual dental exams for proper oral hygiene  Diabetic Exams: Diabetic Eye Exam: 08/26/2018 Diabetic Foot Exam: 05/17/2018  Vaccinations: Influenza vaccine: 11/07/2018 Pneumococcal vaccine: completed; 03/09/2007, 11/01/2018 Tdap vaccine: 05/17/2018; due every 10 years Shingles vaccine:completed Covid vaccine: Pfizer completed  Advanced directives: Advance directives discussed with you today. Please bring a copy of your POA (Power of [place]) and/or Living Will to your next appointment.  Goals:  Recommend to drink at least 6-8 8oz glasses of water per day.  Recommend to exercise for at least 150 minutes per week.  Recommend to remove any items from the home that may cause slips or trips.  Recommend to decrease portion sizes by eating 3 small healthy meals and at least 2 healthy snacks per day.  Recommend to begin DASH diet as directed below  Recommend to continue efforts to reduce smoking habits until no longer smoking. Smoking Cessation literature is attached below.  Next appointment: Please schedule your Annual Wellness Visit with your Nurse Health Advisor in one year.  Preventive Care 19 Years and Older, Male Preventive care refers to lifestyle choices and visits with your health care provider that can promote health and wellness. What does preventive care include?  A yearly physical exam. This is also called an annual well check.  Dental exams once or twice a year.  Routine eye exams. Ask your health care provider how  often you should have your eyes checked.  Personal lifestyle choices, including:  Daily care of your teeth and gums.  Regular physical activity.  Eating a healthy diet.  Avoiding tobacco and drug use.  Limiting alcohol use.  Practicing safe sex.  Taking low doses of aspirin every day if recommended by your health care provider..  Taking vitamin and mineral supplements as recommended by your health care provider. What happens during an annual well check? The services and screenings done by your health care provider during your annual well check will depend on your age, overall health, lifestyle risk factors, and family history of disease. Counseling  Your health care provider may ask you questions about your:  Alcohol use.  Tobacco use.  Drug use.  Emotional well-being.  Home and relationship well-being.  Sexual activity.  Eating habits.  History of falls.  Memory and ability to understand (cognition).  Work and work Statistician. Screening  You may have the following tests or measurements:  Height, weight, and BMI.  Blood pressure.  Lipid and cholesterol levels. These may be checked every 5 years, or more frequently if you are over 82 years old.  Skin check.  Lung cancer screening. You may have this screening every year starting at age 34 if you have a 30-pack-year history of smoking and currently smoke or have quit within the past 15 years.  Fecal occult blood test (FOBT) of the stool. You may have this test every year starting at age 60.  Flexible sigmoidoscopy or colonoscopy. You may have a sigmoidoscopy every 5 years or a colonoscopy every 10 years starting at age 51.  Prostate cancer screening. Recommendations will vary depending on  your family history and other risks.  Hepatitis C blood test.  Hepatitis B blood test.  Sexually transmitted disease (STD) testing.  Diabetes screening. This is done by checking your blood sugar (glucose) after you  have not eaten for a while (fasting). You may have this done every 1-3 years.  Abdominal aortic aneurysm (AAA) screening. You may need this if you are a current or former smoker.  Osteoporosis. You may be screened starting at age 8 if you are at high risk. Talk with your health care provider about your test results, treatment options, and if necessary, the need for more tests. Vaccines  Your health care provider may recommend certain vaccines, such as:  Influenza vaccine. This is recommended every year.  Tetanus, diphtheria, and acellular pertussis (Tdap, Td) vaccine. You may need a Td booster every 10 years.  Zoster vaccine. You may need this after age 76.  Pneumococcal 13-valent conjugate (PCV13) vaccine. One dose is recommended after age 57.  Pneumococcal polysaccharide (PPSV23) vaccine. One dose is recommended after age 66. Talk to your health care provider about which screenings and vaccines you need and how often you need them. This information is not intended to replace advice given to you by your health care provider. Make sure you discuss any questions you have with your health care provider. Document Released: 04/05/2015 Document Revised: 11/27/2015 Document Reviewed: 01/08/2015 Elsevier Interactive Patient Education  2017 [place] Prevention in the Home Falls can cause injuries. They can happen to people of all ages. There are many things you can do to make your home safe and to help prevent falls. What can I do on the outside of my home?  Regularly fix the edges of walkways and driveways and fix any cracks.  Remove anything that might make you trip as you walk through a door, such as a raised step or threshold.  Trim any bushes or trees on the path to your home.  Use bright outdoor lighting.  Clear any walking paths of anything that might make someone trip, such as rocks or tools.  Regularly check to see if handrails are loose or broken. Make sure that  both sides of any steps have handrails.  Any raised decks and porches should have guardrails on the edges.  Have any leaves, snow, or ice cleared regularly.  Use sand or salt on walking paths during winter.  Clean up any spills in your garage right away. This includes oil or grease spills. What can I do in the bathroom?  Use night lights.  Install grab bars by the toilet and in the tub and shower. Do not use towel bars as grab bars.  Use non-skid mats or decals in the tub or shower.  If you need to sit down in the shower, use a plastic, non-slip stool.  Keep the floor dry. Clean up any water that spills on the floor as soon as it happens.  Remove soap buildup in the tub or shower regularly.  Attach bath mats securely with double-sided non-slip rug tape.  Do not have throw rugs and other things on the floor that can make you trip. What can I do in the bedroom?  Use night lights.  Make sure that you have a light by your bed that is easy to reach.  Do not use any sheets or blankets that are too big for your bed. They should not hang down onto the floor.  Have a firm chair that has side arms. You  can use this for support while you get dressed.  Do not have throw rugs and other things on the floor that can make you trip. What can I do in the kitchen?  Clean up any spills right away.  Avoid walking on wet floors.  Keep items that you use a lot in easy-to-reach places.  If you need to reach something above you, use a strong step stool that has a grab bar.  Keep electrical cords out of the way.  Do not use floor polish or wax that makes floors slippery. If you must use wax, use non-skid floor wax.  Do not have throw rugs and other things on the floor that can make you trip. What can I do with my stairs?  Do not leave any items on the stairs.  Make sure that there are handrails on both sides of the stairs and use them. Fix handrails that are broken or loose. Make sure  that handrails are as long as the stairways.  Check any carpeting to make sure that it is firmly attached to the stairs. Fix any carpet that is loose or worn.  Avoid having throw rugs at the top or bottom of the stairs. If you do have throw rugs, attach them to the floor with carpet tape.  Make sure that you have a light switch at the top of the stairs and the bottom of the stairs. If you do not have them, ask someone to add them for you. What else can I do to help prevent falls?  Wear shoes that:  Do not have high heels.  Have rubber bottoms.  Are comfortable and fit you well.  Are closed at the toe. Do not wear sandals.  If you use a stepladder:  Make sure that it is fully opened. Do not climb a closed stepladder.  Make sure that both sides of the stepladder are locked into place.  Ask someone to hold it for you, if possible.  Clearly mark and make sure that you can see:  Any grab bars or handrails.  First and last steps.  Where the edge of each step is.  Use tools that help you move around (mobility aids) if they are needed. These include:  Canes.  Walkers.  Scooters.  Crutches.  Turn on the lights when you go into a dark area. Replace any light bulbs as soon as they burn out.  Set up your furniture so you have a clear path. Avoid moving your furniture around.  If any of your floors are uneven, fix them.  If there are any pets around you, be aware of where they are.  Review your medicines with your doctor. Some medicines can make you feel dizzy. This can increase your chance of falling. Ask your doctor what other things that you can do to help prevent falls. This information is not intended to replace advice given to you by your health care provider. Make sure you discuss any questions you have with your health care provider. Document Released: 01/03/2009 Document Revised: 08/15/2015 Document Reviewed: 04/13/2014 Elsevier Interactive Patient Education   2017 [employer].

## 2019-08-08 NOTE — Progress Notes (Signed)
Subjective:   Allen Hunter. is a 79 y.o. male who presents for Medicare Annual/Subsequent preventive examination.  Review of Systems:  No ROS. Medicare Wellness Visit. Additional risk factors are reflected in social history. Cardiac Risk Factors include: advanced age (>72men, >43 women);diabetes mellitus;dyslipidemia;hypertension;male gender Sleep Patterns: Sleeps during the day and up all night. Home Safety/Smoke Alarms: Feels safe in home; uses home alarm. Smoke alarms in place. Living environment: 1-story home; Lives alone; no needs for DME; good support system. Seat Belt Safety/Bike Helmet: Wears seat belt.     Objective:    Vitals: BP 120/70 (BP Location: Left Arm, Patient Position: Sitting, Cuff Size: Normal)   Pulse 71   Temp 98.1 F (36.7 C)   Resp 16   Ht 5\' 5"  (1.651 m)   Wt 178 lb 9.6 oz (81 kg)   SpO2 96%   BMI 29.72 kg/m   Body mass index is 29.72 kg/m.  Advanced Directives 08/08/2019 06/15/2019 08/02/2018 12/02/2017 07/22/2017 07/17/2016  Does Patient Have a Medical Advance Directive? Yes No No No No No  Does patient want to make changes to medical advance directive? No - Patient declined - - - - -  Would patient like information on creating a medical advance directive? - Yes (ED - Information included in AVS) Yes (ED - Information included in AVS) - Yes (ED - Information included in AVS) Yes (ED - Information included in AVS)    Tobacco Social History   Tobacco Use  Smoking Status Former Smoker  Smokeless Tobacco Never Used     Counseling given: No   Clinical Intake:  Pre-visit preparation completed: Yes  Pain : No/denies pain Pain Score: 0-No pain     BMI - recorded: 29.7 Nutritional Status: BMI 25 -29 Overweight Nutritional Risks: None Diabetes: Yes CBG done?: No(148 this morning) Did pt. bring in CBG monitor from home?: No  How often do you need to have someone help you when you read instructions, pamphlets, or other written materials  from your doctor or pharmacy?: 1 - Never What is the last grade level you completed in school?: Bachelor's Degree  Interpreter Needed?: No  Information entered by :: Suleyma Wafer N. [place]ell Guitar, LPN  Past Medical History:  Diagnosis Date  . ALLERGIC RHINITIS 10/04/2008  . ASTHMA 10/04/2008  . Asthma   . BACK PAIN 04/18/2008  . BENIGN PROSTATIC HYPERTROPHY 03/09/2007  . COLONIC POLYPS, HX OF 03/09/2007  . COPD 10/20/2007  . DIABETES MELLITUS, TYPE II 03/09/2007  . FATIGUE 10/05/2007  . GERD (gastroesophageal reflux disease) 03/01/2012  . GOUT 10/19/2006  . HYPERLIPIDEMIA 10/19/2006  . HYPERTENSION 10/19/2006  . OSA (obstructive sleep apnea) 07/19/2015  . Prostate cancer ([place]) 01/19/2012  . SWELLING MASS OR LUMP IN HEAD AND NECK 01/09/2010   Past Surgical History:  Procedure Laterality Date  . APPENDECTOMY     Family History  Problem Relation Age of Onset  . Hypertension Mother   . Cancer Other    Social History   Socioeconomic History  . Marital status: Single    Spouse name: Not on file  . Number of children: Not on file  . Years of education: Not on file  . Highest education level: Bachelor's degree (e.g., BA, AB, BS)  Occupational History  . Occupation: retired [employer] aug 2009  Tobacco Use  . Smoking status: Former Research scientist (life sciences)  . Smokeless tobacco: Never Used  Substance and Sexual Activity  . Alcohol use: No    Comment: stop 7  years  . Drug use: Never  . Sexual activity: Not Currently  Other Topics Concern  . Not on file  Social History Narrative  . Not on file   Social Determinants of Health   Financial Resource Strain:   . Difficulty of Paying Living Expenses:   Food Insecurity: No Food Insecurity  . Worried About Charity fundraiser in the Last Year: Never true  . Ran Out of Food in the Last Year: Never true  Transportation Needs: No Transportation Needs  . Lack of Transportation (Medical): No  . Lack of Transportation (Non-Medical): No  Physical Activity:    . Days of Exercise per Week:   . Minutes of Exercise per Session:   Stress:   . Feeling of Stress :   Social Connections:   . Frequency of Communication with Friends and Family:   . Frequency of Social Gatherings with Friends and Family:   . Attends Religious Services:   . Active Member of Clubs or Organizations:   . Attends Archivist Meetings:   Marland Kitchen Marital Status:     Outpatient Encounter Medications as of 08/08/2019  Medication Sig  . allopurinol (ZYLOPRIM) 300 MG tablet TAKE 1 TABLET DAILY  . aspirin 81 MG tablet Take 81 mg by mouth daily.    Marland Kitchen atorvastatin (LIPITOR) 80 MG tablet Take 1 tablet (80 mg total) by mouth daily.  . bisoprolol (ZEBETA) 5 MG tablet Take 1 tablet (5 mg total) by mouth daily.  . Dulaglutide (TRULICITY) A999333 0000000 SOPN Inject 0.5 mLs into the skin once a week. (Patient not taking: Reported on 06/15/2019)  . empagliflozin (JARDIANCE) 25 MG TABS tablet Take 25 mg by mouth daily.  Marland Kitchen esomeprazole (NEXIUM) 40 MG capsule Take 40 mg by mouth daily at 12 noon.  . fluticasone (FLONASE) 50 MCG/ACT nasal spray Place 1 spray into both nostrils daily.  . Fluticasone-Umeclidin-Vilant (TRELEGY ELLIPTA) 100-62.5-25 MCG/INH AEPB Inhale 1 puff into the lungs daily.  Marland Kitchen FREESTYLE LITE test strip USE 1 STRIP TWICE A DAY AS NEEDED TO CHECK SUGAR  . glipiZIDE (GLUCOTROL XL) 10 MG 24 hr tablet TAKE 1 TABLET DAILY WITH BREAKFAST  . Lactulose 20 GM/30ML SOLN 30 ml every 8 hrs as needed  . Lancets MISC Use asd 1 per day 250.02  . metFORMIN (GLUCOPHAGE) 1000 MG tablet TAKE 1 TABLET TWICE A DAY WITH A MEAL  . montelukast (SINGULAIR) 10 MG tablet TAKE 1 TABLET (10MG  TOTAL ) BY MOUTH AT BEDTIME  . Multiple Vitamins-Minerals (CENTRUM MEN PO) Take 1 tablet by mouth daily.  . Omega-3 Fatty Acids (FISH OIL) 500 MG CAPS Take by mouth daily.    . sildenafil (VIAGRA) 100 MG tablet TAKE ONE-HALF (1/2) TO ONE TABLET (50 MG TO 100 MG) DAILY AS NEEDED FOR ERECTILE DYSFUNCTION  .  tamsulosin (FLOMAX) 0.4 MG CAPS capsule Take 1 capsule (0.4 mg total) by mouth daily.  . trospium (SANCTURA) 20 MG tablet Take 20 mg by mouth 2 (two) times daily.  . vitamin B-12 (CYANOCOBALAMIN) 1000 MCG tablet Take 1,000 mcg by mouth daily.   No facility-administered encounter medications on file as of 08/08/2019.    Activities of Daily Living In your present state of health, do you have any difficulty performing the following activities: 08/08/2019 06/15/2019  Hearing? N N  Vision? N N  Difficulty concentrating or making decisions? Y N  Walking or climbing stairs? N N  Dressing or bathing? N N  Doing errands, shopping? N [employer]  and eating ? N N  Using the Toilet? N N  In the past six months, have you accidently leaked urine? N Y  Comment - Hx. of prostate cancer followed by Dr. Ivery Quale  Do you have problems with loss of bowel control? N N  Managing your Medications? N N  Managing your Finances? N N  Housekeeping or managing your Housekeeping? N N  Some recent data might be hidden    Patient Care Team: [name], MD as PCP - General Wine, Argie Ramming, RN as [place] Management   Assessment:   This is a routine wellness examination for Allen Hunter.  Exercise Activities and Dietary recommendations Current Exercise Habits: The patient does not participate in regular exercise at present, Exercise limited by: respiratory conditions(s)  Goals    . lose 10 pounds     Continue to exercise,  eat healthy diet and Increase water intake.       Fall Risk Fall Risk  08/08/2019 07/18/2019 06/15/2019 05/10/2019 05/10/2019  Falls in the past year? 0 0 0 0 0  Number falls in past yr: 0 0 0 - -  Injury with Fall? 0 0 0 - -  Risk for fall due to : No Fall Risks - - - -  Follow up - Falls prevention discussed;Education provided;Falls evaluation completed Falls prevention discussed;Education provided;Falls evaluation completed - -   Is the patient's home free of loose  throw rugs in walkways, pet beds, electrical cords, etc?   yes      Grab bars in the bathroom? yes      Handrails on the stairs?   yes      Adequate lighting?   yes    Depression Screen PHQ 2/9 Scores 08/08/2019 06/15/2019 05/10/2019 05/10/2019  PHQ - 2 Score 0 0 0 0  PHQ- 9 Score - - - -    Cognitive Function MMSE - Mini Mental State Exam 07/22/2017  Orientation to time 5  Orientation to Place 5  Registration 3  Attention/ Calculation 3  Recall 3  Language- name 2 objects 2  Language- repeat 1  Language- follow 3 step command 3  Language- read & follow direction 1  Write a sentence 1  Copy design 1  Total score 28     6CIT Screen 08/08/2019  What Year? 0 points  What month? 0 points  What time? 3 points  Count back from 20 0 points  Months in reverse 0 points  Repeat phrase 4 points  Total Score 7    Immunization History  Administered Date(s) Administered  . H1N1 04/02/2008  . Influenza Whole 03/09/2007, 12/27/2007, 01/21/2009  . Influenza, High Dose Seasonal PF 11/18/2016  . Influenza, Seasonal, Injecte, Preservative Fre 02/02/2013  . Influenza,inj,Quad PF,6+ Mos 01/18/2015  . Influenza-Unspecified 01/15/2016, 01/19/2018, 11/01/2018  . PFIZER SARS-COV-2 Vaccination 05/05/2019  . Pneumococcal Conjugate-13 04/04/2013, 11/01/2018  . Pneumococcal Polysaccharide-23 03/09/2007  . Td 04/02/2008  . Tdap 05/17/2018  . Zoster 03/07/2013    Qualifies for Shingles Vaccine? Yes  Screening Tests Health Maintenance  Topic Date Due  . FOOT EXAM  05/18/2019  . COVID-19 Vaccine (2 - Pfizer 2-dose series) 05/26/2019  . OPHTHALMOLOGY EXAM  08/26/2019  . INFLUENZA VACCINE  10/22/2019  . URINE MICROALBUMIN  05/09/2020  . TETANUS/TDAP  05/17/2028  . PNA vac Low Risk Adult  Completed   Cancer Screenings: Lung: Low Dose CT Chest recommended if Age 63-80 years, 30 pack-year currently smoking OR have quit w/in 15years.  Patient does not qualify. Colorectal: Yes, not recommended  due to age      Plan:     Reviewed health maintenance screenings with patient today and relevant education, vaccines, and/or referrals were provided.    Continue doing brain stimulating activities (puzzles, reading, adult coloring books, staying active) to keep memory sharp.    Continue to eat heart healthy diet (full of fruits, vegetables, whole grains, lean protein, water--limit salt, fat, and sugar intake) and increase physical activity as tolerated.   I have personally reviewed and noted the following in the patient's chart:   . Medical and social history . Use of alcohol, tobacco or illicit drugs  . Current medications and supplements . Functional ability and status . Nutritional status . Physical activity . Advanced directives . List of other physicians . Hospitalizations, surgeries, and ER visits in previous 12 months . Vitals . Screenings to include cognitive, depression, and falls . Referrals and appointments  In addition, I have reviewed and discussed with patient certain preventive protocols, quality metrics, and best practice recommendations. A written personalized care plan for preventive services as well as general preventive health recommendations were provided to patient.     Sheral Flow, LPN  [id number] Nurse Health Advisor

## 2019-08-14 ENCOUNTER — Other Ambulatory Visit: Payer: Self-pay | Admitting: *Deleted

## 2019-08-14 NOTE — Patient Outreach (Signed)
[facility]) Care Management  08/14/2019  [name] [birth year]  Successful telephone outreach call to patient. HIPAA identifiers obtained. Patient answered the phone and asked that nurse call him back at a later date.   Plan: RN Health Coach will call patient within a month and patient agrees to future outreach calls.   [name] RN, BSN [place] [phone] [email or web address]

## 2019-08-16 ENCOUNTER — Other Ambulatory Visit: Payer: Self-pay | Admitting: *Deleted

## 2019-08-16 NOTE — Patient Outreach (Signed)
[name]) Care Management  [place]  08/16/2019   Allen Hunter. [birth year] [id number]  Subjective: Successful telephone outreach call to patient. HIPAA identifiers obtained. Patient reports that his diabetes is not as controlled as he would like. His FBS today was 169 and ranges from 136-188. His postprandial blood glucose ranges from 150-278. Patient admits he is not counting his carbohydrates and monitoring sugar closely but has started to read food labels. Per patient he is maintaining his yard but has not started to go back to the gym. He denies any falls or pain presently and states he continues to be committed to trying to lower his A1c to avoid having to start taking injectable diabetic medications.  Encounter Medications:  Outpatient Encounter Medications as of 08/16/2019  Medication Sig Note  . allopurinol (ZYLOPRIM) 300 MG tablet TAKE 1 TABLET DAILY   . aspirin 81 MG tablet Take 81 mg by mouth daily.     Marland Kitchen atorvastatin (LIPITOR) 80 MG tablet Take 1 tablet (80 mg total) by mouth daily.   . bisoprolol (ZEBETA) 5 MG tablet Take 1 tablet (5 mg total) by mouth daily.   . Dulaglutide (TRULICITY) A999333 0000000 SOPN Inject 0.5 mLs into the skin once a week. (Patient not taking: Reported on 06/15/2019) 06/15/2019: Patient did not want to take medication  . empagliflozin (JARDIANCE) 25 MG TABS tablet Take 25 mg by mouth daily.   Marland Kitchen esomeprazole (NEXIUM) 40 MG capsule Take 40 mg by mouth daily at 12 noon.   . fluticasone (FLONASE) 50 MCG/ACT nasal spray Place 1 spray into both nostrils daily.   . Fluticasone-Umeclidin-Vilant (TRELEGY ELLIPTA) 100-62.5-25 MCG/INH AEPB Inhale 1 puff into the lungs daily.   Marland Kitchen FREESTYLE LITE test strip USE 1 STRIP TWICE A DAY AS NEEDED TO CHECK SUGAR   . glipiZIDE (GLUCOTROL XL) 10 MG 24 hr tablet TAKE 1 TABLET DAILY WITH BREAKFAST   . Lactulose 20 GM/30ML SOLN 30 ml every 8 hrs as needed   . Lancets MISC Use asd 1 per day 250.02   .  metFORMIN (GLUCOPHAGE) 1000 MG tablet TAKE 1 TABLET TWICE A DAY WITH A MEAL   . montelukast (SINGULAIR) 10 MG tablet TAKE 1 TABLET (10MG  TOTAL ) BY MOUTH AT BEDTIME   . Multiple Vitamins-Minerals (CENTRUM MEN PO) Take 1 tablet by mouth daily.   . Omega-3 Fatty Acids (FISH OIL) 500 MG CAPS Take by mouth daily.     . sildenafil (VIAGRA) 100 MG tablet TAKE ONE-HALF (1/2) TO ONE TABLET (50 MG TO 100 MG) DAILY AS NEEDED FOR ERECTILE DYSFUNCTION   . tamsulosin (FLOMAX) 0.4 MG CAPS capsule Take 1 capsule (0.4 mg total) by mouth daily.   . trospium (SANCTURA) 20 MG tablet Take 20 mg by mouth 2 (two) times daily.   . vitamin B-12 (CYANOCOBALAMIN) 1000 MCG tablet Take 1,000 mcg by mouth daily.    No facility-administered encounter medications on file as of 08/16/2019.    Functional Status:  In your present state of health, do you have any difficulty performing the following activities: 08/08/2019 06/15/2019  Hearing? N N  Vision? N N  Difficulty concentrating or making decisions? Y N  Walking or climbing stairs? N N  Dressing or bathing? N N  Doing errands, shopping? N N  Preparing Food and eating ? N N  Using the Toilet? N N  In the past six months, have you accidently leaked urine? N Y  Comment - Hx. of prostate cancer followed  by Dr. Ivery Quale  Do you have problems with loss of bowel control? N N  Managing your Medications? N N  Managing your Finances? N N  Housekeeping or managing your Housekeeping? N N  Some recent data might be hidden    Fall/Depression Screening: Fall Risk  08/08/2019 07/18/2019 06/15/2019  Falls in the past year? 0 0 0  Number falls in past yr: 0 0 0  Injury with Fall? 0 0 0  Risk for fall due to : No Fall Risks - -  Follow up - Falls prevention discussed;Education provided;Falls evaluation completed Falls prevention discussed;Education provided;Falls evaluation completed   PHQ 2/9 Scores 08/08/2019 06/15/2019 05/10/2019 05/10/2019 08/02/2018 11/17/2017 07/22/2017  PHQ - 2 Score  0 0 0 0 0 0 1  PHQ- 9 Score - - - - - - 1   [facility] CM Care Plan Problem One     Most Recent Value  Care Plan Problem One  Knowledge deficient related to diabetes condition, treatment plan, and lifestyle changes.  Role Documenting the Problem One  [place] for Problem One  Active  [facility] Long Term Goal   Patient will report reduction in A1c by 0.5-1 points in the next 90 days.  [facility] Long Term Goal Start Date  06/15/19  Interventions for Problem One Long Term Goal  RN discussed importance of monitoring sugar and counting carbohydrates, encouraged patient to continue with daily FBS and AC CBG monitoring and record data, encouraged medication adherence, discussed reflecting back to diet when BS values are high, sent educational video EMMIs, Will send Planning Healthy Meals booklet, encouraged patient to attend [place] [facility] support group and brochure has been provided  [facility] CM Short Term Goal #1   Patient will increase his physical activity by doing something physical for 30 minutes daily  within the next 30 days  [facility] CM Short Term Goal #1 Start Date  07/18/19  Interventions for Short Term Goal #1  RN reviewed that exercise helps to lower glucose levels and is heart healthy, encouraged patient to increase physical activity to 30 minutes daily  [facility] CM Short Term Goal #2   Patient will start to count carbohydrates each meal within the next 30 days  Interventions for Short Term Goal #2  RN reviewed with patient how to read food labels, discussed limiting carbohydrates to 50-60 grams per meal and 25-30 grams per snack, patient states he is reading food labels when he buys food explained he would start to count the carbohydrates when eats his meals and snacks.      Plan: Nurse will send diabetes video education via EMMI, will send a Planning Healthy Meals booklet, and will call patient within the month of July and patient agrees to future outreach calls.   Allen Loron RN, BSN [place] [phone] [email or web address]

## 2019-08-25 ENCOUNTER — Ambulatory Visit (INDEPENDENT_AMBULATORY_CARE_PROVIDER_SITE_OTHER): Payer: Medicare Other | Admitting: Physician Assistant

## 2019-08-25 ENCOUNTER — Other Ambulatory Visit: Payer: Self-pay

## 2019-08-25 ENCOUNTER — Encounter: Payer: Self-pay | Admitting: Physician Assistant

## 2019-08-25 VITALS — BP 124/78 | HR 72 | Ht 65.0 in | Wt 179.8 lb

## 2019-08-25 DIAGNOSIS — I5032 Chronic diastolic (congestive) heart failure: Secondary | ICD-10-CM | POA: Diagnosis not present

## 2019-08-25 DIAGNOSIS — G4733 Obstructive sleep apnea (adult) (pediatric): Secondary | ICD-10-CM | POA: Diagnosis not present

## 2019-08-25 DIAGNOSIS — R0600 Dyspnea, unspecified: Secondary | ICD-10-CM | POA: Diagnosis not present

## 2019-08-25 DIAGNOSIS — I10 Essential (primary) hypertension: Secondary | ICD-10-CM | POA: Diagnosis not present

## 2019-08-25 DIAGNOSIS — R0609 Other forms of dyspnea: Secondary | ICD-10-CM

## 2019-08-25 NOTE — Progress Notes (Signed)
Cardiology Office Note    Date:  08/25/2019   ID:  Allen Dakin., DOB [birth year], MRN [id number]  PCP:  [name], MD  Cardiologist: Dr. Acie Fredrickson   Chief Complaint: 3  Months follow up  History of Present Illness:   Allen Hunter. is a 79 y.o. male with hx of chronic diastolic CHF, COPD (followed by pulmonologist), OSA on CPAP, HTN, HLD, DM and PAC seen for follow up.   Hx of DOE. Echo 05/2018 showed LVEF 60-65% and grade 1 DD. Coronary CT 11/2018 showed calcium score of 45. Minimal non obstructive CAD. Moderately dilated pulmonary artery measuring 38 mm suggestive of pulmonary hypertension. Scattered tiny bilateral solid and ground-glass pulmonary nodules, similar to the dedicated CT chest of 06/07/2018.   Last seen by Dr. Acie Fredrickson 05/2019.  Here today for follow-up.  Patient reports gradual worsening of dyspnea on exertion without chest tightness pressure. Used to walk 45 to 60 minutes without any issue last year however now he has to stop after walking for 10 minutes. denies orthopnea, PND, syncope, lower extremity edema or melena.  He says he stopped using CPAP about a year ago.   Past Medical History:  Diagnosis Date  . ALLERGIC RHINITIS 10/04/2008  . ASTHMA 10/04/2008  . Asthma   . BACK PAIN 04/18/2008  . BENIGN PROSTATIC HYPERTROPHY 03/09/2007  . COLONIC POLYPS, HX OF 03/09/2007  . COPD 10/20/2007  . DIABETES MELLITUS, TYPE II 03/09/2007  . FATIGUE 10/05/2007  . GERD (gastroesophageal reflux disease) 03/01/2012  . GOUT 10/19/2006  . HYPERLIPIDEMIA 10/19/2006  . HYPERTENSION 10/19/2006  . OSA (obstructive sleep apnea) 07/19/2015  . Prostate cancer ([place]) 01/19/2012  . SWELLING MASS OR LUMP IN HEAD AND NECK 01/09/2010    Past Surgical History:  Procedure Laterality Date  . APPENDECTOMY      Current Medications: Prior to Admission medications   Medication Sig Start Date End Date Taking? Authorizing Provider  allopurinol (ZYLOPRIM) 300 MG tablet TAKE 1 TABLET DAILY  03/08/19   [name], MD  aspirin 81 MG tablet Take 81 mg by mouth daily.      [provider]  atorvastatin (LIPITOR) 80 MG tablet Take 1 tablet (80 mg total) by mouth daily. 02/10/19   [name], MD  bisoprolol (ZEBETA) 5 MG tablet Take 1 tablet (5 mg total) by mouth daily. 06/26/19   Nahser, Wonda Cheng, MD  Dulaglutide (TRULICITY) 2.63 ZC/5.8IF SOPN Inject 0.5 mLs into the skin once a week. Patient not taking: Reported on 06/15/2019 05/17/19   [name], MD  empagliflozin (JARDIANCE) 25 MG TABS tablet Take 25 mg by mouth daily. 02/10/19   [name], MD  esomeprazole (NEXIUM) 40 MG capsule Take 40 mg by mouth daily at 12 noon.    [provider]  fluticasone (FLONASE) 50 MCG/ACT nasal spray Place 1 spray into both nostrils daily.    [provider]  Fluticasone-Umeclidin-Vilant (TRELEGY ELLIPTA) 100-62.5-25 MCG/INH AEPB Inhale 1 puff into the lungs daily. 11/17/17   [name], MD  FREESTYLE LITE test strip USE 1 STRIP TWICE A DAY AS NEEDED TO CHECK SUGAR 08/05/19   [name], MD  glipiZIDE (GLUCOTROL XL) 10 MG 24 hr tablet TAKE 1 TABLET DAILY WITH BREAKFAST 09/09/18   [name], MD  Lactulose 20 GM/30ML SOLN 30 ml every 8 hrs as needed 05/10/19   [name], MD  Lancets MISC Use asd 1 per day 250.02 10/26/13  [name], MD  metFORMIN (GLUCOPHAGE) 1000 MG tablet TAKE 1 TABLET TWICE A DAY WITH A MEAL 06/09/19   [name], MD  montelukast (SINGULAIR) 10 MG tablet TAKE 1 TABLET (10MG  TOTAL ) BY MOUTH AT BEDTIME 01/18/13   [name], MD  Multiple Vitamins-Minerals (CENTRUM MEN PO) Take 1 tablet by mouth daily.    [provider]  Omega-3 Fatty Acids (FISH OIL) 500 MG CAPS Take by mouth daily.      [provider]  sildenafil (VIAGRA) 100 MG tablet TAKE ONE-HALF (1/2) TO ONE TABLET (50 MG TO 100 MG) DAILY AS NEEDED FOR ERECTILE DYSFUNCTION 02/08/19   [name], MD  tamsulosin (FLOMAX) 0.4 MG CAPS capsule Take 1 capsule  (0.4 mg total) by mouth daily. 05/18/17   [name], MD  trospium (SANCTURA) 20 MG tablet Take 20 mg by mouth 2 (two) times daily. 06/08/19   [provider]  vitamin B-12 (CYANOCOBALAMIN) 1000 MCG tablet Take 1,000 mcg by mouth daily.    [provider]    Allergies:   Patient has no known allergies.   Social History   Socioeconomic History  . Marital status: Single    Spouse name: Not on file  . Number of children: Not on file  . Years of education: Not on file  . Highest education level: Bachelor's degree (e.g., BA, AB, BS)  Occupational History  . Occupation: retired [employer] aug 2009  Tobacco Use  . Smoking status: Former Research scientist (life sciences)  . Smokeless tobacco: Never Used  Substance and Sexual Activity  . Alcohol use: No    Comment: stop 7 years  . Drug use: Never  . Sexual activity: Not Currently  Other Topics Concern  . Not on file  Social History Narrative  . Not on file   Social Determinants of Health   Financial Resource Strain:   . Difficulty of Paying Living Expenses:   Food Insecurity: No Food Insecurity  . Worried About Charity fundraiser in the Last Year: Never true  . Ran Out of Food in the Last Year: Never true  Transportation Needs: No Transportation Needs  . Lack of Transportation (Medical): No  . Lack of Transportation (Non-Medical): No  Physical Activity:   . Days of Exercise per Week:   . Minutes of Exercise per Session:   Stress:   . Feeling of Stress :   Social Connections:   . Frequency of Communication with Friends and Family:   . Frequency of Social Gatherings with Friends and Family:   . Attends Religious Services:   . Active Member of Clubs or Organizations:   . Attends Archivist Meetings:   Marland Kitchen Marital Status:      Family History:  The patient's family history includes Cancer in an other family member; Hypertension in his mother.  ROS:   Please see the history of present illness.    ROS All other systems  reviewed and are negative.   PHYSICAL EXAM:   VS:  BP 124/78   Pulse 72   Ht 5\' 5"  (1.651 m)   Wt 179 lb 12.8 oz (81.6 kg)   SpO2 95%   BMI 29.92 kg/m    GEN: Well nourished, well developed, in no acute distress  HEENT: normal  Neck: no JVD, carotid bruits, or masses Cardiac: RRR; no murmurs, rubs, or gallops,no edema  Respiratory:  clear to auscultation bilaterally, normal work of breathing GI: soft, nontender, nondistended, +  BS MS: no deformity or atrophy  Skin: warm and dry, no rash Neuro:  Alert and Oriented x 3, Strength and sensation are intact Psych: euthymic mood, full affect  Wt Readings from Last 3 Encounters:  08/25/19 179 lb 12.8 oz (81.6 kg)  08/08/19 178 lb 9.6 oz (81 kg)  05/23/19 181 lb (82.1 kg)      Studies/Labs Reviewed:   EKG:  EKG is not ordered today.    Recent Labs: 05/10/2019: ALT 21; BUN 44; Creatinine, Ser 1.12; Hemoglobin 14.1; Platelets 237.0; Potassium 4.3; Sodium 141; TSH 1.71   Lipid Panel    Component Value Date/Time   CHOL 182 05/10/2019 1444   TRIG 83.0 05/10/2019 1444   TRIG 49 03/30/2006 1021   HDL 53.30 05/10/2019 1444   CHOLHDL 3 05/10/2019 1444   VLDL 16.6 05/10/2019 1444   LDLCALC 112 (H) 05/10/2019 1444    Additional studies/ records that were reviewed today include:   Echocardiogram: 05/2018 1. The left ventricle has normal systolic function with an ejection  fraction of 60-65%. The cavity size was decreased. Left ventricular  diastolic Doppler parameters are consistent with impaired relaxation.  2. The right ventricle has normal systolic function. The cavity was  normal. There is no increase in right ventricular wall thickness.  3. The mitral valve is normal in structure.  4. The tricuspid valve is normal in structure.  5. The aortic valve is tricuspid Mild thickening of the aortic valve.  6. The pulmonic valve was normal in structure.  7. The aortic root is normal in size and structure.   CT coronary  11/2018 IMPRESSION: 1. Scattered tiny bilateral solid and ground-glass pulmonary nodules, similar to the dedicated CT chest of 06/07/2018. Follow-up CT chest in 6-12 months recommended to confirm continued stability. 2.  Emphysema. ([id number]) IMPRESSION: 1. Coronary calcium score of 45. This was 92 percentile for age and sex matched control.  2. Normal coronary origin with right dominance.  3. CAD-RADS 1. Minimal non-obstructive CAD (0-24%). Consider non-atherosclerotic causes of chest pain. Consider preventive therapy and risk factor modification.  4. Moderately dilated pulmonary artery measuring 38 mm suggestive of pulmonary hypertension.    ASSESSMENT & PLAN:    1. Chronic diastolic CHF/dyspnea on exertion -No signs of volume overload.  Patient reports gradually worsening dyspnea on exertion for past 1 year.  No exertional chest tightness or pressure.  Minimal nonobstructive CAD by CT coronary 12/11/2018>> this does not explain his symptoms.  At that time, coronary CT showed pulmonary artery pressure of 38 mmHg and a ground glass pulmonary nodules.  Normal LV function by echocardiogram March 2020 with grade 1 diastolic dysfunction. -Given worsening dyspnea on exertion will repeat echocardiogram. -No change in therapy. No signs of volume overload by exam.   2. HTN -On bisoprolol -Blood pressure stable  3. OSA on CPAP -Noncompliant.  He stopped using CPAP about a year ago.  Same since then his dyspnea has worsened.  Advised to follow-up with his lung doctor at [facility] clinic.  4. COPD/pulmonary nodules -Followed by pulmonologist     Medication Adjustments/Labs and Tests Ordered: Current medicines are reviewed at length with the patient today.  Concerns regarding medicines are outlined above.  Medication changes, Labs and Tests ordered today are listed in the Patient Instructions below. Patient Instructions  Medication Instructions:  Your physician recommends that  you continue on your current medications as directed. Please refer to the Current Medication list given to you today.  *If you need a refill  on your cardiac medications before your next appointment, please call your pharmacy*   Lab Work: None ordered  If you have labs (blood work) drawn today and your tests are completely normal, you will receive your results only by: Marland Kitchen MyChart Message (if you have MyChart) OR . A paper copy in the mail If you have any lab test that is abnormal or we need to change your treatment, we will call you to review the results.   Testing/Procedures: Your physician has requested that you have an echocardiogram. Echocardiography is a painless test that uses sound waves to create images of your heart. It provides your doctor with information about the size and shape of your heart and how well your heart's chambers and valves are working. This procedure takes approximately one hour. There are no restrictions for this procedure.     Follow-Up: At [facility], you and your health needs are our priority.  As part of our continuing mission to provide you with exceptional heart care, we have created designated Provider Care Teams.  These Care Teams include your primary Cardiologist (physician) and Advanced Practice Providers (APPs -  Physician Assistants and Nurse Practitioners) who all work together to provide you with the care you need, when you need it.  We recommend signing up for the patient portal called "MyChart".  Sign up information is provided on this After Visit Summary.  MyChart is used to connect with patients for Virtual Visits (Telemedicine).  Patients are able to view lab/test results, encounter notes, upcoming appointments, etc.  Non-urgent messages can be sent to your provider as well.   To learn more about what you can do with MyChart, go to [email or web address].    Your next appointment:   10/23/19 ARRIVE 3:00 TO SEE DR. NAHSER  The format for  your next appointment:   In Person  Provider:   You may see Dr. Mertie Moores or one of the following Advanced Practice Providers on your designated Care Team:    Richardson Dopp, PA-C  [place], [place]    Other Instructions  Echocardiogram An echocardiogram is a procedure that uses painless sound waves (ultrasound) to produce an image of the heart. Images from an echocardiogram can provide important information about:  Signs of coronary artery disease (CAD).  Aneurysm detection. An aneurysm is a weak or damaged part of an artery wall that bulges out from the normal force of blood pumping through the body.  Heart size and shape. Changes in the size or shape of the heart can be associated with certain conditions, including heart failure, aneurysm, and CAD.  Heart muscle function.  Heart valve function.  Signs of a past heart attack.  Fluid buildup around the heart.  Thickening of the heart muscle.  A tumor or infectious growth around the heart valves. Tell a health care provider about:  Any allergies you have.  All medicines you are taking, including vitamins, herbs, eye drops, creams, and over-the-counter medicines.  Any blood disorders you have.  Any surgeries you have had.  Any medical conditions you have.  Whether you are pregnant or may be pregnant. What are the risks? Generally, this is a safe procedure. However, problems may occur, including:  Allergic reaction to dye (contrast) that may be used during the procedure. What happens before the procedure? No specific preparation is needed. You may eat and drink normally. What happens during the procedure?   An IV tube may be inserted into one of your veins.  You may  receive contrast through this tube. A contrast is an injection that improves the quality of the pictures from your heart.  A gel will be applied to your chest.  A wand-like tool (transducer) will be moved over your chest. The gel will help to  transmit the sound waves from the transducer.  The sound waves will harmlessly bounce off of your heart to allow the heart images to be captured in real-time motion. The images will be recorded on a computer. The procedure may vary among health care providers and hospitals. What happens after the procedure?  You may return to your normal, everyday life, including diet, activities, and medicines, unless your health care provider tells you not to do that. Summary  An echocardiogram is a procedure that uses painless sound waves (ultrasound) to produce an image of the heart.  Images from an echocardiogram can provide important information about the size and shape of your heart, heart muscle function, heart valve function, and fluid buildup around your heart.  You do not need to do anything to prepare before this procedure. You may eat and drink normally.  After the echocardiogram is completed, you may return to your normal, everyday life, unless your health care provider tells you not to do that. This information is not intended to replace advice given to you by your health care provider. Make sure you discuss any questions you have with your health care provider. Document Revised: 06/30/2018 Document Reviewed: 04/11/2016 Elsevier Patient Education  2020 [place], [place], [place]  08/25/2019 3:42 PM    [facility] [place], [place], [place]  [address] Phone: [phone]; Fax: [phone]

## 2019-08-25 NOTE — Patient Instructions (Addendum)
Medication Instructions:  Your physician recommends that you continue on your current medications as directed. Please refer to the Current Medication list given to you today.  *If you need a refill on your cardiac medications before your next appointment, please call your pharmacy*   Lab Work: None ordered  If you have labs (blood work) drawn today and your tests are completely normal, you will receive your results only by: Marland Kitchen MyChart Message (if you have MyChart) OR . A paper copy in the mail If you have any lab test that is abnormal or we need to change your treatment, we will call you to review the results.   Testing/Procedures: Your physician has requested that you have an echocardiogram. Echocardiography is a painless test that uses sound waves to create images of your heart. It provides your doctor with information about the size and shape of your heart and how well your heart's chambers and valves are working. This procedure takes approximately one hour. There are no restrictions for this procedure.     Follow-Up: At [facility], you and your health needs are our priority.  As part of our continuing mission to provide you with exceptional heart care, we have created designated Provider Care Teams.  These Care Teams include your primary Cardiologist (physician) and Advanced Practice Providers (APPs -  Physician Assistants and Nurse Practitioners) who all work together to provide you with the care you need, when you need it.  We recommend signing up for the patient portal called "MyChart".  Sign up information is provided on this After Visit Summary.  MyChart is used to connect with patients for Virtual Visits (Telemedicine).  Patients are able to view lab/test results, encounter notes, upcoming appointments, etc.  Non-urgent messages can be sent to your provider as well.   To learn more about what you can do with MyChart, go to [email or web address].    Your next appointment:    10/23/19 ARRIVE 3:00 TO SEE DR. NAHSER  The format for your next appointment:   In Person  Provider:   You may see Dr. Mertie Moores or one of the following Advanced Practice Providers on your designated Care Team:    Richardson Dopp, PA-C  Vin [place], [place]    Other Instructions  Echocardiogram An echocardiogram is a procedure that uses painless sound waves (ultrasound) to produce an image of the heart. Images from an echocardiogram can provide important information about:  Signs of coronary artery disease (CAD).  Aneurysm detection. An aneurysm is a weak or damaged part of an artery wall that bulges out from the normal force of blood pumping through the body.  Heart size and shape. Changes in the size or shape of the heart can be associated with certain conditions, including heart failure, aneurysm, and CAD.  Heart muscle function.  Heart valve function.  Signs of a past heart attack.  Fluid buildup around the heart.  Thickening of the heart muscle.  A tumor or infectious growth around the heart valves. Tell a health care provider about:  Any allergies you have.  All medicines you are taking, including vitamins, herbs, eye drops, creams, and over-the-counter medicines.  Any blood disorders you have.  Any surgeries you have had.  Any medical conditions you have.  Whether you are pregnant or may be pregnant. What are the risks? Generally, this is a safe procedure. However, problems may occur, including:  Allergic reaction to dye (contrast) that may be used during the procedure. What happens before the procedure?  No specific preparation is needed. You may eat and drink normally. What happens during the procedure?   An IV tube may be inserted into one of your veins.  You may receive contrast through this tube. A contrast is an injection that improves the quality of the pictures from your heart.  A gel will be applied to your chest.  A wand-like tool  (transducer) will be moved over your chest. The gel will help to transmit the sound waves from the transducer.  The sound waves will harmlessly bounce off of your heart to allow the heart images to be captured in real-time motion. The images will be recorded on a computer. The procedure may vary among health care providers and hospitals. What happens after the procedure?  You may return to your normal, everyday life, including diet, activities, and medicines, unless your health care provider tells you not to do that. Summary  An echocardiogram is a procedure that uses painless sound waves (ultrasound) to produce an image of the heart.  Images from an echocardiogram can provide important information about the size and shape of your heart, heart muscle function, heart valve function, and fluid buildup around your heart.  You do not need to do anything to prepare before this procedure. You may eat and drink normally.  After the echocardiogram is completed, you may return to your normal, everyday life, unless your health care provider tells you not to do that. This information is not intended to replace advice given to you by your health care provider. Make sure you discuss any questions you have with your health care provider. Document Revised: 06/30/2018 Document Reviewed: 04/11/2016 Elsevier Patient Education  [place].

## 2019-09-04 ENCOUNTER — Other Ambulatory Visit: Payer: Self-pay | Admitting: Internal Medicine

## 2019-09-04 NOTE — Telephone Encounter (Signed)
Please refill as per office routine med refill policy (all routine meds refilled for 3 mo or monthly per pt preference up to one year from last visit, then month to month grace period for 3 mo, then further med refills will have to be denied)  

## 2019-09-06 ENCOUNTER — Other Ambulatory Visit: Payer: Self-pay

## 2019-09-06 MED ORDER — TRULICITY 0.75 MG/0.5ML ~~LOC~~ SOAJ: 0.7500 mg | SUBCUTANEOUS | 3 refills | Status: DC

## 2019-09-18 ENCOUNTER — Other Ambulatory Visit: Payer: Self-pay

## 2019-09-18 ENCOUNTER — Ambulatory Visit (HOSPITAL_COMMUNITY): Payer: Medicare Other | Attending: Cardiovascular Disease

## 2019-09-18 DIAGNOSIS — R0600 Dyspnea, unspecified: Secondary | ICD-10-CM | POA: Diagnosis not present

## 2019-09-18 DIAGNOSIS — I5032 Chronic diastolic (congestive) heart failure: Secondary | ICD-10-CM

## 2019-09-18 DIAGNOSIS — R0609 Other forms of dyspnea: Secondary | ICD-10-CM

## 2019-09-28 ENCOUNTER — Other Ambulatory Visit: Payer: Self-pay | Admitting: *Deleted

## 2019-09-28 NOTE — Patient Outreach (Signed)
[facility]) Care Management  09/28/2019  Allen Hunter. 1940-07-08 [id number]   Successful telephone outreach call to patient. HIPAA identifiers obtained. Patient answered the phone, explained that he could not speak today, and asked the nurse to call him back at a later date.   Plan: RN Health Coach will call patient within a month and patient agrees to future outreach calls.   [name] RN, BSN [place] [phone] [email or web address]

## 2019-10-01 ENCOUNTER — Other Ambulatory Visit: Payer: Self-pay | Admitting: Internal Medicine

## 2019-10-01 NOTE — Telephone Encounter (Signed)
Please refill as per office routine med refill policy (all routine meds refilled for 3 mo or monthly per pt preference up to one year from last visit, then month to month grace period for 3 mo, then further med refills will have to be denied)  

## 2019-10-02 ENCOUNTER — Other Ambulatory Visit: Payer: Self-pay | Admitting: *Deleted

## 2019-10-02 DIAGNOSIS — N2889 Other specified disorders of kidney and ureter: Secondary | ICD-10-CM | POA: Diagnosis not present

## 2019-10-02 DIAGNOSIS — I700 Atherosclerosis of aorta: Secondary | ICD-10-CM | POA: Diagnosis not present

## 2019-10-02 DIAGNOSIS — D49512 Neoplasm of unspecified behavior of left kidney: Secondary | ICD-10-CM | POA: Diagnosis not present

## 2019-10-02 NOTE — Patient Outreach (Addendum)
[facility]) Care Management  10/02/2019  Maliq Pilley. 01/06/1941 [id number]  Unsuccessful outreach attempt made to patient. RN Health Coach left HIPAA compliant voicemail message along with her contact information.  Plan: RN Health Coach will call patient within the month of August.  [name] RN, BSN [place] [phone] [email or web address]

## 2019-10-04 DIAGNOSIS — R3912 Poor urinary stream: Secondary | ICD-10-CM | POA: Diagnosis not present

## 2019-10-04 DIAGNOSIS — D49512 Neoplasm of unspecified behavior of left kidney: Secondary | ICD-10-CM | POA: Diagnosis not present

## 2019-10-23 ENCOUNTER — Other Ambulatory Visit: Payer: Self-pay

## 2019-10-23 ENCOUNTER — Encounter: Payer: Self-pay | Admitting: Cardiovascular Disease

## 2019-10-23 ENCOUNTER — Other Ambulatory Visit: Payer: Self-pay | Admitting: Internal Medicine

## 2019-10-23 ENCOUNTER — Ambulatory Visit (INDEPENDENT_AMBULATORY_CARE_PROVIDER_SITE_OTHER): Payer: Medicare Other | Admitting: Cardiovascular Disease

## 2019-10-23 VITALS — BP 118/60 | HR 72 | Ht 65.0 in | Wt 179.0 lb

## 2019-10-23 DIAGNOSIS — I5032 Chronic diastolic (congestive) heart failure: Secondary | ICD-10-CM

## 2019-10-23 DIAGNOSIS — I10 Essential (primary) hypertension: Secondary | ICD-10-CM | POA: Diagnosis not present

## 2019-10-23 DIAGNOSIS — R918 Other nonspecific abnormal finding of lung field: Secondary | ICD-10-CM

## 2019-10-23 DIAGNOSIS — R0600 Dyspnea, unspecified: Secondary | ICD-10-CM

## 2019-10-23 NOTE — Patient Instructions (Signed)
Medication Instructions:  Your provider recommends that you continue on your current medications as directed. Please refer to the Current Medication list given to you today.   *If you need a refill on your cardiac medications before your next appointment, please call your pharmacy*  Follow-Up: At [facility], you and your health needs are our priority.  As part of our continuing mission to provide you with exceptional heart care, we have created designated Provider Care Teams.  These Care Teams include your primary Cardiologist (physician) and Advanced Practice Providers (APPs -  Physician Assistants and Nurse Practitioners) who all work together to provide you with the care you need, when you need it. Your next appointment:   6 month(s) The format for your next appointment:   In Person Provider:   Vin Bhagat, PA-C  

## 2019-10-23 NOTE — Telephone Encounter (Signed)
Please refill as per office routine med refill policy (all routine meds refilled for 3 mo or monthly per pt preference up to one year from last visit, then month to month grace period for 3 mo, then further med refills will have to be denied)  

## 2019-10-23 NOTE — Progress Notes (Signed)
Cardiology Office Note:    Date:  10/23/2019   ID:  Allen Hunter., DOB 1940/11/10, MRN [id number]  PCP:  [name], MD  Cardiologist:  Talea Manges  Electrophysiologist:  None   Referring MD: [name], MD   Chief Complaint  Patient presents with  . Congestive Heart Failure  . Hyperlipidemia     Aug. 25, 2020    Allen Hunter. is a 79 y.o. male with a hx of COPD  ,HTN.  OSA. I met him in April during a telemedicine visit .  Has been eating more canned foods and lots of salty , preserved meats ,  ( [place] sausage,  Spam, bologna, package ham,  [place],  Hot dogs )   he is seen back today for follow up visit Echo from May 23, 2018 revealed normal LV systolic function,  Grade 1 diastolic dysfunction  Seeing him in person for the first time today - first visit was telemedicine     Overall feels well.   Has some DOE , DOE has worsened over the past 18 months .  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   May 23, 2019:  [name] is seen today for follow-up of his COPD, hypertension, obstructive sleep apnea. Coronary CT angiogram revealed tiny bilateral solid and groundglass pulmonary nodules similar to his previous CT scan performed March, 2020.  They recommended follow-up in 6 to 12 months.  Coronary evaluation revealed a coronary calcium score of 45 which is 14th percentile for age and gender matched controls.  His LAD has very minor irregularities.  Left circumflex artery had only luminal irregularities.  The right coronary artery had only minor luminal irregularities.  Echocardiogram from March, 2020 reveals normal left ventricular systolic function.  He has grade 1 diastolic dysfunction.  No significant valvular abnormalities.  No CP ,  Breathing is unchange.   Does not get any exercise   Avoids salt.    October 23, 2019: [name] is seen today for follow-up of his COPD,  hypertension, obstructive sleep apnea.  He has a coronary calcium score 35 which is the 14th percentile for age and gender matched controls.  His CT scan also revealed some groundglass pulmonary nodules but this was similar to his previous CT scan in March 2020.  He was seen by Vin in June, 2021.  No cp  He is still eating lots of salty foods.   He has grade 1 diastolic CHF    Past Medical History:  Diagnosis Date  . ALLERGIC RHINITIS 10/04/2008  . ASTHMA 10/04/2008  . Asthma   . BACK PAIN 04/18/2008  . BENIGN PROSTATIC HYPERTROPHY 03/09/2007  . COLONIC POLYPS, HX OF 03/09/2007  . COPD 10/20/2007  . DIABETES MELLITUS, TYPE II 03/09/2007  . FATIGUE 10/05/2007  . GERD (gastroesophageal reflux disease) 03/01/2012  . GOUT 10/19/2006  . HYPERLIPIDEMIA 10/19/2006  . HYPERTENSION 10/19/2006  . OSA (obstructive sleep apnea) 07/19/2015  . Prostate cancer ([place]) 01/19/2012  . SWELLING MASS OR LUMP IN HEAD AND NECK 01/09/2010    Past Surgical History:  Procedure Laterality Date  . APPENDECTOMY      Current Medications: Current Meds  Medication Sig  . allopurinol (ZYLOPRIM) 300 MG tablet TAKE 1 TABLET DAILY  . aspirin 81 MG tablet Take 81 mg by mouth daily.    Marland Kitchen atorvastatin (LIPITOR) 80 MG tablet Take  1 tablet (80 mg total) by mouth daily.  . bisoprolol (ZEBETA) 5 MG tablet Take 1 tablet (5 mg total) by mouth daily.  . Dulaglutide (TRULICITY) 9.79 GX/2.1JH SOPN Inject 0.5 mLs (0.75 mg total) into the skin once a week.  . fluticasone (FLONASE) 50 MCG/ACT nasal spray Place 1 spray into both nostrils daily.  . Fluticasone-Umeclidin-Vilant (TRELEGY ELLIPTA) 100-62.5-25 MCG/INH AEPB Inhale 1 puff into the lungs daily.  Marland Kitchen FREESTYLE LITE test strip USE 1 STRIP TWICE A DAY AS NEEDED TO CHECK SUGAR  . glipiZIDE (GLUCOTROL XL) 10 MG 24 hr tablet TAKE 1 TABLET DAILY WITH BREAKFAST  . JARDIANCE 25 MG TABS tablet TAKE 1 TABLET DAILY  . Lactulose 20 GM/30ML SOLN 30 ml every 8 hrs as needed  . Lancets  MISC Use asd 1 per day 250.02  . metFORMIN (GLUCOPHAGE) 1000 MG tablet TAKE 1 TABLET TWICE A DAY WITH A MEAL  . montelukast (SINGULAIR) 10 MG tablet TAKE 1 TABLET (10MG TOTAL ) BY MOUTH AT BEDTIME  . Multiple Vitamins-Minerals (CENTRUM MEN PO) Take 1 tablet by mouth daily.  . Omega-3 Fatty Acids (FISH OIL) 500 MG CAPS Take by mouth daily.    . sildenafil (VIAGRA) 100 MG tablet TAKE ONE-HALF (1/2) TO ONE TABLET (50 MG TO 100 MG) DAILY AS NEEDED FOR ERECTILE DYSFUNCTION  . tamsulosin (FLOMAX) 0.4 MG CAPS capsule Take 1 capsule (0.4 mg total) by mouth daily.  . trospium (SANCTURA) 20 MG tablet Take 20 mg by mouth 2 (two) times daily.  . vitamin B-12 (CYANOCOBALAMIN) 1000 MCG tablet Take 1,000 mcg by mouth daily.  . [DISCONTINUED] esomeprazole (NEXIUM) 40 MG capsule Take 40 mg by mouth daily at 12 noon.     Allergies:   Patient has no known allergies.   Social History   Socioeconomic History  . Marital status: Single    Spouse name: Not on file  . Number of children: Not on file  . Years of education: Not on file  . Highest education level: Bachelor's degree (e.g., BA, AB, BS)  Occupational History  . Occupation: retired [employer] aug 2009  Tobacco Use  . Smoking status: Former Research scientist (life sciences)  . Smokeless tobacco: Never Used  Vaping Use  . Vaping Use: Never used  Substance and Sexual Activity  . Alcohol use: No    Comment: stop 7 years  . Drug use: Never  . Sexual activity: Not Currently  Other Topics Concern  . Not on file  Social History Narrative  . Not on file   Social Determinants of Health   Financial Resource Strain:   . Difficulty of Paying Living Expenses:   Food Insecurity: No Food Insecurity  . Worried About Charity fundraiser in the Last Year: Never true  . Ran Out of Food in the Last Year: Never true  Transportation Needs: No Transportation Needs  . Lack of Transportation (Medical): No  . Lack of Transportation (Non-Medical): No  Physical Activity:   . Days of  Exercise per Week:   . Minutes of Exercise per Session:   Stress:   . Feeling of Stress :   Social Connections:   . Frequency of Communication with Friends and Family:   . Frequency of Social Gatherings with Friends and Family:   . Attends Religious Services:   . Active Member of Clubs or Organizations:   . Attends Archivist Meetings:   Marland Kitchen Marital Status:      Family History: The patient's family history includes  Cancer in an other family member; Hypertension in his mother.  ROS:   Please see the history of present illness.     All other systems reviewed and are negative.  EKGs/Labs/Other Studies Reviewed:    The following studies were reviewed today:   EKG:  Aug. 25, 2020: NSR at 65.   No ST or T wave abn.    Recent Labs: 05/10/2019: ALT 21; BUN 44; Creatinine, Ser 1.12; Hemoglobin 14.1; Platelets 237.0; Potassium 4.3; Sodium 141; TSH 1.71  Recent Lipid Panel    Component Value Date/Time   CHOL 182 05/10/2019 1444   TRIG 83.0 05/10/2019 1444   TRIG 49 03/30/2006 1021   HDL 53.30 05/10/2019 1444   CHOLHDL 3 05/10/2019 1444   VLDL 16.6 05/10/2019 1444   LDLCALC 112 (H) 05/10/2019 1444    Physical Exam:    Physical Exam: Blood pressure 118/60, pulse 72, height '5\' 5"'  (1.651 m), weight 179 lb (81.2 kg), SpO2 95 %.  GEN:  Well nourished, well developed in no acute distress HEENT: Normal NECK: No JVD; No carotid bruits LYMPHATICS: No lymphadenopathy CARDIAC: RRR , no murmurs, rubs, gallops RESPIRATORY:  Clear to auscultation without rales, wheezing or rhonchi  ABDOMEN: Soft, non-tender, non-distended MUSCULOSKELETAL:  No edema; No deformity  SKIN: Warm and dry NEUROLOGIC:  Alert and oriented x 3   ASSESSMENT:    1. Essential hypertension   2. Chronic diastolic CHF (congestive heart failure) (HCC)    PLAN:    In order of problems listed above:  1.  DOE:       Chronic issue.   Has grade 1 diastolic dysfunction and still eats quite a bit of salt.   This may be worsening some of his symptoms but it is hard to say that it would necessarily cause all of his problems.  I do want him to cut back on his salt.  We had a long discussion about salt reduction.  He CT scan also shows some groundglass appearances.  I have forwarded these results to his primary medical doctor and also to Dr. Raul Del of pulmonary.  2.  Premature ventricular contractions :    Stable     Medication Adjustments/Labs and Tests Ordered: Current medicines are reviewed at length with the patient today.  Concerns regarding medicines are outlined above.  No orders of the defined types were placed in this encounter.  No orders of the defined types were placed in this encounter.   Patient Instructions  Medication Instructions:  Your provider recommends that you continue on your current medications as directed. Please refer to the Current Medication list given to you today.   *If you need a refill on your cardiac medications before your next appointment, please call your pharmacy*   Follow-Up: At [facility], you and your health needs are our priority.  As part of our continuing mission to provide you with exceptional heart care, we have created designated Provider Care Teams.  These Care Teams include your primary Cardiologist (physician) and Advanced Practice Providers (APPs -  Physician Assistants and Nurse Practitioners) who all work together to provide you with the care you need, when you need it. Your next appointment:   6 month(s) The format for your next appointment:   In Person Provider:   Robbie Lis, PA-C     Signed, Mertie Moores, MD  10/23/2019 5:00 PM    [place]

## 2019-10-23 NOTE — Telephone Encounter (Signed)
Ok to let pt know, on further review of the sept 2020 ct scan by Dr Acie Fredrickson, he recommended also seeing pulmonary due to the SOB, so I will refer

## 2019-10-25 DIAGNOSIS — R918 Other nonspecific abnormal finding of lung field: Secondary | ICD-10-CM | POA: Diagnosis not present

## 2019-10-25 DIAGNOSIS — R0600 Dyspnea, unspecified: Secondary | ICD-10-CM | POA: Diagnosis not present

## 2019-10-25 DIAGNOSIS — J439 Emphysema, unspecified: Secondary | ICD-10-CM | POA: Diagnosis not present

## 2019-10-25 DIAGNOSIS — G4733 Obstructive sleep apnea (adult) (pediatric): Secondary | ICD-10-CM | POA: Diagnosis not present

## 2019-10-25 DIAGNOSIS — K219 Gastro-esophageal reflux disease without esophagitis: Secondary | ICD-10-CM | POA: Diagnosis not present

## 2019-10-26 ENCOUNTER — Other Ambulatory Visit: Payer: Self-pay | Admitting: Specialist

## 2019-10-26 DIAGNOSIS — J849 Interstitial pulmonary disease, unspecified: Secondary | ICD-10-CM

## 2019-10-26 DIAGNOSIS — G4733 Obstructive sleep apnea (adult) (pediatric): Secondary | ICD-10-CM

## 2019-11-01 ENCOUNTER — Other Ambulatory Visit: Payer: Self-pay | Admitting: *Deleted

## 2019-11-01 NOTE — Patient Outreach (Signed)
[facility]) Care Management  11/01/2019  Aydyn Testerman. 1940/05/16 [id number]  Unsuccessful outreach attempt made to patient. RN Health Coach left HIPAA compliant voicemail message along with her contact information.  Plan: RN Health Coach will call patient within the month of September.  [name] RN, BSN [place] [phone] [email or web address]

## 2019-11-14 ENCOUNTER — Ambulatory Visit: Payer: TRICARE For Life (TFL) | Admitting: Internal Medicine

## 2019-11-20 LAB — HM DIABETES EYE EXAM

## 2019-11-22 ENCOUNTER — Ambulatory Visit
Admission: RE | Admit: 2019-11-22 | Discharge: 2019-11-22 | Disposition: A | Discharge status: Home / Self Care | Payer: Medicare Other | Source: Ambulatory Visit | Attending: Specialist | Admitting: Specialist

## 2019-11-22 ENCOUNTER — Other Ambulatory Visit: Payer: Self-pay

## 2019-11-22 DIAGNOSIS — R0602 Shortness of breath: Secondary | ICD-10-CM | POA: Diagnosis not present

## 2019-11-22 DIAGNOSIS — G4733 Obstructive sleep apnea (adult) (pediatric): Secondary | ICD-10-CM | POA: Diagnosis not present

## 2019-11-22 DIAGNOSIS — J849 Interstitial pulmonary disease, unspecified: Secondary | ICD-10-CM | POA: Insufficient documentation

## 2019-11-28 ENCOUNTER — Other Ambulatory Visit: Payer: Self-pay

## 2019-11-28 ENCOUNTER — Ambulatory Visit (INDEPENDENT_AMBULATORY_CARE_PROVIDER_SITE_OTHER): Payer: Medicare Other | Admitting: Internal Medicine

## 2019-11-28 VITALS — BP 130/76 | HR 79 | Temp 98.7°F | Ht 65.0 in | Wt 177.0 lb

## 2019-11-28 DIAGNOSIS — E119 Type 2 diabetes mellitus without complications: Secondary | ICD-10-CM | POA: Diagnosis not present

## 2019-11-28 DIAGNOSIS — N183 Chronic kidney disease, stage 3 unspecified: Secondary | ICD-10-CM | POA: Diagnosis not present

## 2019-11-28 DIAGNOSIS — Z0000 Encounter for general adult medical examination without abnormal findings: Secondary | ICD-10-CM | POA: Diagnosis not present

## 2019-11-28 NOTE — Patient Instructions (Signed)

## 2019-11-28 NOTE — Progress Notes (Signed)
Subjective:    Patient ID: Allen Dakin., male    DOB: 09/16/1940, 79 y.o.   MRN: [id number]  HPI  Here for wellness and f/u;  Overall doing ok;  Pt denies Chest pain, worsening SOB, DOE, wheezing, orthopnea, PND, worsening LE edema, palpitations, dizziness or syncope.  Pt denies neurological change such as new headache, facial or extremity weakness.  Pt denies polydipsia, polyuria, or low sugar symptoms. Pt states overall good compliance with treatment and medications, good tolerability, and has been trying to follow appropriate diet.  Pt denies worsening depressive symptoms, suicidal ideation or panic. No fever, night sweats, loss of appetite, or other constitutional symptoms.  Pt states good ability with ADL's, has low fall risk, home safety reviewed and adequate, no other significant changes in hearing or vision, and only occasionally active with exercise.  overal lost 20 lbs with better diet Wt Readings from Last 3 Encounters:  11/28/19 177 lb (80.3 kg)  10/23/19 179 lb (81.2 kg)  08/25/19 179 lb 12.8 oz (81.6 kg)   Past Medical History:  Diagnosis Date  . ALLERGIC RHINITIS 10/04/2008  . ASTHMA 10/04/2008  . Asthma   . BACK PAIN 04/18/2008  . BENIGN PROSTATIC HYPERTROPHY 03/09/2007  . COLONIC POLYPS, HX OF 03/09/2007  . COPD 10/20/2007  . DIABETES MELLITUS, TYPE II 03/09/2007  . FATIGUE 10/05/2007  . GERD (gastroesophageal reflux disease) 03/01/2012  . GOUT 10/19/2006  . HYPERLIPIDEMIA 10/19/2006  . HYPERTENSION 10/19/2006  . OSA (obstructive sleep apnea) 07/19/2015  . Prostate cancer ([place]) 01/19/2012  . SWELLING MASS OR LUMP IN HEAD AND NECK 01/09/2010   Past Surgical History:  Procedure Laterality Date  . APPENDECTOMY      reports that he has quit smoking. He has never used smokeless tobacco. He reports that he does not drink alcohol and does not use drugs. family history includes Cancer in an other family member; Hypertension in his mother. No Known Allergies Current  Outpatient Medications on File Prior to Visit  Medication Sig Dispense Refill  . allopurinol (ZYLOPRIM) 300 MG tablet TAKE 1 TABLET DAILY 90 tablet 3  . aspirin 81 MG tablet Take 81 mg by mouth daily.      Marland Kitchen atorvastatin (LIPITOR) 80 MG tablet TAKE 1 TABLET DAILY 90 tablet 3  . bisoprolol (ZEBETA) 5 MG tablet Take 1 tablet (5 mg total) by mouth daily. 90 tablet 3  . Dulaglutide (TRULICITY) 4.31 VQ/0.0QQ SOPN Inject 0.5 mLs (0.75 mg total) into the skin once a week. 6 mL 3  . esomeprazole (NEXIUM) 40 MG capsule TAKE 1 CAPSULE DAILY 90 capsule 3  . fluticasone (FLONASE) 50 MCG/ACT nasal spray Place 1 spray into both nostrils daily.    . Fluticasone-Umeclidin-Vilant (TRELEGY ELLIPTA) 100-62.5-25 MCG/INH AEPB Inhale 1 puff into the lungs daily. 3 each 3  . FREESTYLE LITE test strip USE 1 STRIP TWICE A DAY AS NEEDED TO CHECK SUGAR 100 each 6  . glipiZIDE (GLUCOTROL XL) 10 MG 24 hr tablet TAKE 1 TABLET DAILY WITH BREAKFAST 90 tablet 2  . JARDIANCE 25 MG TABS tablet TAKE 1 TABLET DAILY 90 tablet 3  . Lactulose 20 GM/30ML SOLN 30 ml every 8 hrs as needed 450 mL 5  . Lancets MISC Use asd 1 per day 250.02 100 each 11  . metFORMIN (GLUCOPHAGE) 1000 MG tablet TAKE 1 TABLET TWICE A DAY WITH A MEAL 180 tablet 2  . montelukast (SINGULAIR) 10 MG tablet TAKE 1 TABLET (10MG  TOTAL ) BY MOUTH AT BEDTIME  90 tablet 2  . Multiple Vitamins-Minerals (CENTRUM MEN PO) Take 1 tablet by mouth daily.    . Omega-3 Fatty Acids (FISH OIL) 500 MG CAPS Take by mouth daily.      . sildenafil (VIAGRA) 100 MG tablet TAKE ONE-HALF (1/2) TO ONE TABLET (50 MG TO 100 MG) DAILY AS NEEDED FOR ERECTILE DYSFUNCTION 18 tablet 3  . tamsulosin (FLOMAX) 0.4 MG CAPS capsule Take 1 capsule (0.4 mg total) by mouth daily. 90 capsule 3  . trospium (SANCTURA) 20 MG tablet Take 20 mg by mouth 2 (two) times daily.    . vitamin B-12 (CYANOCOBALAMIN) 1000 MCG tablet Take 1,000 mcg by mouth daily.     No current facility-administered medications on  file prior to visit.   Review of Systems All otherwise neg per pt    Objective:   Physical Exam BP 130/76 (BP Location: Left Arm, Patient Position: Sitting, Cuff Size: Large)   Pulse 79   Temp 98.7 F (37.1 C) (Oral)   Ht 5\' 5"  (1.651 m)   Wt 177 lb (80.3 kg)   SpO2 92%   BMI 29.45 kg/m  VS noted,  Constitutional: Pt appears in NAD HENT: Head: NCAT.  Right Ear: External ear normal.  Left Ear: External ear normal.  Eyes: . Pupils are equal, round, and reactive to light. Conjunctivae and EOM are normal Nose: without d/c or deformity Neck: Neck supple. Gross normal ROM Cardiovascular: Normal rate and regular rhythm.   Pulmonary/Chest: Effort normal and breath sounds without rales or wheezing.  Abd:  Soft, NT, ND, + BS, no organomegaly Neurological: Pt is alert. At baseline orientation, motor grossly intact Skin: Skin is warm. No rashes, other new lesions, no LE edema Psychiatric: Pt behavior is normal without agitation  All otherwise neg per pt Lab Results  Component Value Date   WBC 5.8 11/28/2019   HGB 14.2 11/28/2019   HCT 45.5 11/28/2019   PLT 228 11/28/2019   GLUCOSE 60 (L) 11/28/2019   CHOL 171 11/28/2019   TRIG 95 11/28/2019   HDL 53 11/28/2019   LDLCALC 99 11/28/2019   ALT 18 11/28/2019   AST 17 11/28/2019   NA 142 11/28/2019   K 4.5 11/28/2019   CL 103 11/28/2019   CREATININE 1.10 11/28/2019   BUN 32 (H) 11/28/2019   CO2 26 11/28/2019   TSH 1.59 11/28/2019   PSA 3.70 05/10/2019   HGBA1C 7.1 (H) 11/28/2019   MICROALBUR 3.8 11/28/2019      Assessment & Plan:

## 2019-11-29 ENCOUNTER — Encounter: Payer: Self-pay | Admitting: Internal Medicine

## 2019-11-29 LAB — COMPLETE METABOLIC PANEL WITH GFR
AG Ratio: 1.8 (calc) (ref 1.0–2.5)
ALT: 18 U/L (ref 9–46)
AST: 17 U/L (ref 10–35)
Albumin: 4.2 g/dL (ref 3.6–5.1)
Alkaline phosphatase (APISO): 50 U/L (ref 35–144)
BUN/Creatinine Ratio: 29 (calc) — ABNORMAL HIGH (ref 6–22)
BUN: 32 mg/dL — ABNORMAL HIGH (ref 7–25)
CO2: 26 mmol/L (ref 20–32)
Calcium: 10.2 mg/dL (ref 8.6–10.3)
Chloride: 103 mmol/L (ref 98–110)
Creat: 1.1 mg/dL (ref 0.70–1.11)
GFR, Est African American: 73 mL/min/{1.73_m2} (ref >=60)
GFR, Est Non African American: 63 mL/min/{1.73_m2} (ref >=60)
Globulin: 2.3 g/dL (calc) (ref 1.9–3.7)
Glucose, Bld: 60 mg/dL — ABNORMAL LOW (ref 65–99)
Potassium: 4.5 mmol/L (ref 3.5–5.3)
Sodium: 142 mmol/L (ref 135–146)
Total Bilirubin: 0.5 mg/dL (ref 0.2–1.2)
Total Protein: 6.5 g/dL (ref 6.1–8.1)

## 2019-11-29 LAB — HEMOGLOBIN A1C
Hgb A1c MFr Bld: 7.1 % of total Hgb — ABNORMAL HIGH (ref <5.7)
Mean Plasma Glucose: 157 (calc)
eAG (mmol/L): 8.7 (calc)

## 2019-11-29 LAB — CBC WITH DIFFERENTIAL/PLATELET
Absolute Monocytes: 592 cells/uL (ref 200–950)
Basophils Absolute: 41 cells/uL (ref 0–200)
Basophils Relative: 0.7 %
Eosinophils Absolute: 70 cells/uL (ref 15–500)
Eosinophils Relative: 1.2 %
HCT: 45.5 % (ref 38.5–50.0)
Hemoglobin: 14.2 g/dL (ref 13.2–17.1)
Lymphs Abs: 1682 cells/uL (ref 850–3900)
MCH: 29 pg (ref 27.0–33.0)
MCHC: 31.2 g/dL — ABNORMAL LOW (ref 32.0–36.0)
MCV: 92.9 fL (ref 80.0–100.0)
MPV: 11.5 fL (ref 7.5–12.5)
Monocytes Relative: 10.2 %
Neutro Abs: 3416 cells/uL (ref 1500–7800)
Neutrophils Relative %: 58.9 %
Platelets: 228 10*3/uL (ref 140–400)
RBC: 4.9 10*6/uL (ref 4.20–5.80)
RDW: 14.9 % (ref 11.0–15.0)
Total Lymphocyte: 29 %
WBC: 5.8 10*3/uL (ref 3.8–10.8)

## 2019-11-29 LAB — MICROALBUMIN / CREATININE URINE RATIO
Creatinine, Urine: 99 mg/dL (ref 20–320)
Microalb Creat Ratio: 38 mcg/mg creat — ABNORMAL HIGH (ref <30)
Microalb, Ur: 3.8 mg/dL

## 2019-11-29 LAB — URINALYSIS, ROUTINE W REFLEX MICROSCOPIC
Bilirubin Urine: NEGATIVE
Hgb urine dipstick: NEGATIVE
Leukocytes,Ua: NEGATIVE
Nitrite: NEGATIVE
Protein, ur: NEGATIVE
Specific Gravity, Urine: 1.028 (ref 1.001–1.03)
pH: <=5 (ref 5.0–8.0)

## 2019-11-29 LAB — LIPID PANEL
Cholesterol: 171 mg/dL (ref <200)
HDL: 53 mg/dL (ref >=40)
LDL Cholesterol (Calc): 99 mg/dL (calc)
Non-HDL Cholesterol (Calc): 118 mg/dL (calc) (ref <130)
Total CHOL/HDL Ratio: 3.2 (calc) (ref <5.0)
Triglycerides: 95 mg/dL (ref <150)

## 2019-11-29 LAB — TSH: TSH: 1.59 mIU/L (ref 0.40–4.50)

## 2019-12-03 ENCOUNTER — Encounter: Payer: Self-pay | Admitting: Internal Medicine

## 2019-12-03 NOTE — Assessment & Plan Note (Signed)
stable overall by history and exam, recent data reviewed with pt, and pt to continue medical treatment as before,  to f/u any worsening symptoms or concerns  

## 2019-12-03 NOTE — Assessment & Plan Note (Signed)

## 2019-12-04 ENCOUNTER — Encounter: Payer: Self-pay | Admitting: Internal Medicine

## 2019-12-04 DIAGNOSIS — N2889 Other specified disorders of kidney and ureter: Secondary | ICD-10-CM | POA: Diagnosis not present

## 2019-12-04 DIAGNOSIS — E875 Hyperkalemia: Secondary | ICD-10-CM | POA: Diagnosis not present

## 2019-12-04 DIAGNOSIS — I129 Hypertensive chronic kidney disease with stage 1 through stage 4 chronic kidney disease, or unspecified chronic kidney disease: Secondary | ICD-10-CM | POA: Diagnosis not present

## 2019-12-04 DIAGNOSIS — N1831 Chronic kidney disease, stage 3a: Secondary | ICD-10-CM | POA: Diagnosis not present

## 2019-12-07 ENCOUNTER — Other Ambulatory Visit: Payer: Self-pay | Admitting: *Deleted

## 2019-12-07 NOTE — Patient Outreach (Addendum)
[facility]) Care Management  12/07/2019  Allen Hunter. 15-Jun-1940 [id number]  Unsuccessful outreach attempt made to patient. RN Health Coach left HIPAA compliant voicemail message along with her contact information.  Plan: RN Health Coach will call patient within the month of October.  [name] RN, BSN [place] [phone] [name]

## 2019-12-08 DIAGNOSIS — G4733 Obstructive sleep apnea (adult) (pediatric): Secondary | ICD-10-CM | POA: Diagnosis not present

## 2019-12-08 DIAGNOSIS — J439 Emphysema, unspecified: Secondary | ICD-10-CM | POA: Diagnosis not present

## 2019-12-08 DIAGNOSIS — R0600 Dyspnea, unspecified: Secondary | ICD-10-CM | POA: Diagnosis not present

## 2019-12-08 DIAGNOSIS — N2889 Other specified disorders of kidney and ureter: Secondary | ICD-10-CM | POA: Diagnosis not present

## 2019-12-13 ENCOUNTER — Ambulatory Visit: Payer: TRICARE For Life (TFL) | Admitting: Urology

## 2020-01-08 ENCOUNTER — Telehealth: Payer: Self-pay | Admitting: Internal Medicine

## 2020-01-08 NOTE — Telephone Encounter (Signed)
[employer] RN on line states: BS >300, most recent sugar 345, prior mid-low 200s.  Does not know how to use trulicity pen. Advised RN to instruct pt to go to pharmacy to assistance with how to use pen or to have someone help him read the directions that came with pen.

## 2020-01-08 NOTE — Telephone Encounter (Signed)
    Patient elevated blood sugar Most recent  BS,today 345  Call transferred to [facility] at [facility]

## 2020-01-09 NOTE — Telephone Encounter (Signed)
[facility] [employer] Report Call : ---Caller states his blood sugar was 345 "today". It's been high for weeks. On medication metformin, giardance, and a couple others  Advise speak with PCP now.  [employer] RN spoke with office, see below.

## 2020-01-10 ENCOUNTER — Ambulatory Visit: Payer: Medicare Other | Admitting: Internal Medicine

## 2020-01-17 ENCOUNTER — Other Ambulatory Visit: Payer: Self-pay | Admitting: *Deleted

## 2020-01-17 NOTE — Patient Outreach (Addendum)
[facility]) Care Management  [place]  01/17/2020   Allen Hunter. 05/24/1940 [id number]  Subjective: Successful telephone outreach call to patient. HIPAA identifiers obtained. Patient reports that he has been feeling good. He did say that his rubber sole shoe caught his area rug this morning and he fell. Patient denies any injury and explained that was the first time he fell in a long while. He plans to throw out the shoes he was wearing. Patient reports that he is independent with all of his ADLs and IADLs and denies needing any DME or support at this time.   Patient stated that he is concerned about his recent high blood sugar readings. He explained that he had difficulty learning how to inject his prescribed Trulicity but has since learned how to inject himself. He called the doctor to report this and his blood sugar reading at that time was 345. Today his blood sugar is 256 and per patient it has remained elevated with his lowest value of 179. Patient's A1c on 11/28/19 was 7.1.  Nurse reviewed the patient's medications, the patient reports that he is taking his medications as prescribed, does not have any questions, and has given himself the Trulicity twice without any difficulties. Patient did admit that he has not been following a diabetic diet. Nurse voiced concern about the patient's high blood sugar values and asked patient to get the diabetic education she previously sent the patient so they could review the material together. Patient obtained the material and the this nurse and patient reviewed reading food labels, counting carbohydrates, foods that are high in sugar and carbohydrates, and discussed limiting the patient's carbohydrates to less than 60 grams each meal and less than 30 grams with snacks. Nurse scheduled an appointment with the patient to call him on 01/23/20 at 0900 to follow up regarding his blood sugar values. It was discussed that if his values have  not improved Dr. Jenny Reichmann will need to be notified. Patient verbalized understanding.   Encounter Medications:  Outpatient Encounter Medications as of 01/17/2020  Medication Sig  . allopurinol (ZYLOPRIM) 300 MG tablet TAKE 1 TABLET DAILY  . aspirin 81 MG tablet Take 81 mg by mouth daily.    Marland Kitchen atorvastatin (LIPITOR) 80 MG tablet TAKE 1 TABLET DAILY  . bisoprolol (ZEBETA) 5 MG tablet Take 1 tablet (5 mg total) by mouth daily.  . Dulaglutide (TRULICITY) 5.59 RC/1.6LA SOPN Inject 0.5 mLs (0.75 mg total) into the skin once a week.  . esomeprazole (NEXIUM) 40 MG capsule TAKE 1 CAPSULE DAILY  . fluticasone (FLONASE) 50 MCG/ACT nasal spray Place 1 spray into both nostrils daily.  . Fluticasone-Umeclidin-Vilant (TRELEGY ELLIPTA) 100-62.5-25 MCG/INH AEPB Inhale 1 puff into the lungs daily.  Marland Kitchen FREESTYLE LITE test strip USE 1 STRIP TWICE A DAY AS NEEDED TO CHECK SUGAR  . glipiZIDE (GLUCOTROL XL) 10 MG 24 hr tablet TAKE 1 TABLET DAILY WITH BREAKFAST  . JARDIANCE 25 MG TABS tablet TAKE 1 TABLET DAILY  . Lactulose 20 GM/30ML SOLN 30 ml every 8 hrs as needed  . Lancets MISC Use asd 1 per day 250.02  . metFORMIN (GLUCOPHAGE) 1000 MG tablet TAKE 1 TABLET TWICE A DAY WITH A MEAL  . montelukast (SINGULAIR) 10 MG tablet TAKE 1 TABLET (10MG  TOTAL ) BY MOUTH AT BEDTIME  . Multiple Vitamins-Minerals (CENTRUM MEN PO) Take 1 tablet by mouth daily.  . Omega-3 Fatty Acids (FISH OIL) 500 MG CAPS Take by mouth daily.    Marland Kitchen  sildenafil (VIAGRA) 100 MG tablet TAKE ONE-HALF (1/2) TO ONE TABLET (50 MG TO 100 MG) DAILY AS NEEDED FOR ERECTILE DYSFUNCTION  . tamsulosin (FLOMAX) 0.4 MG CAPS capsule Take 1 capsule (0.4 mg total) by mouth daily.  . trospium (SANCTURA) 20 MG tablet Take 20 mg by mouth 2 (two) times daily.  . vitamin B-12 (CYANOCOBALAMIN) 1000 MCG tablet Take 1,000 mcg by mouth daily.   No facility-administered encounter medications on file as of 01/17/2020.    Functional Status:  In your present state of health, do  you have any difficulty performing the following activities: 08/08/2019 06/15/2019  Hearing? N N  Vision? N N  Difficulty concentrating or making decisions? Y N  Walking or climbing stairs? N N  Dressing or bathing? N N  Doing errands, shopping? N N  Preparing Food and eating ? N N  Using the Toilet? N N  In the past six months, have you accidently leaked urine? N Y  Comment - Hx. of prostate cancer followed by Dr. Ivery Quale  Do you have problems with loss of bowel control? N N  Managing your Medications? N N  Managing your Finances? N N  Housekeeping or managing your Housekeeping? N N  Some recent data might be hidden    Fall/Depression Screening: Fall Risk  01/17/2020 01/17/2020 11/28/2019  Falls in the past year? 1 1 0  Number falls in past yr: 0 0 -  Injury with Fall? 0 0 -  Risk for fall due to : History of fall(s) - -  Follow up Falls evaluation completed;Education provided;Falls prevention discussed Falls prevention discussed;Education provided;Falls evaluation completed -   PHQ 2/9 Scores 11/28/2019 08/08/2019 06/15/2019 05/10/2019 05/10/2019 08/02/2018 11/17/2017  PHQ - 2 Score 0 0 0 0 0 0 0  PHQ- 9 Score - - - - - - -    Assessment:  Goals Addressed            This Visit's Progress   . Learn More About My Health       Follow Up Date 03/21/20    - make a list of questions - ask questions - repeat what I heard to make sure I understand - bring a list of my medicines to the visit - speak up when I don't understand    Why is this important?   The best way to learn about your health and care is by talking to the doctor and nurse.  They will answer your questions and give you information in the way that you like best.    Notes:     Marland Kitchen Manage My Medicine       Follow Up Date 03/21/20    - call for medicine refill 2 or 3 days before it runs out - keep a list of all the medicines I take; vitamins and herbals too - learn to read medicine labels    Why is this important?    These steps will help you keep on track with your medicines.    Notes:     Marland Kitchen Monitor and Manage My Blood Sugar       Follow Up Date 03/21/20    - check blood sugar at prescribed times - check blood sugar if I feel it is too high or too low - enter blood sugar readings and medication or insulin into daily log - take the blood sugar log to all doctor visits    Why is this important?   Checking your blood sugar  at home helps to keep it from getting very high or very low.  Writing the results in a diary or log helps the doctor know how to care for you.  Your blood sugar log should have the time, date and the results.  Also, write down the amount of insulin or other medicine that you take.  Other information, like what you ate, exercise done and how you were feeling, will also be helpful.     Notes:     . Perform Foot Care       Follow Up Date 03/21/20    - check feet daily for cuts, sores or redness - wash and dry feet carefully every day - wear comfortable, cotton socks - wear comfortable, well-fitting shoes    Why is this important?   Good foot care is very important when you have diabetes.  There are many things you can do to keep your feet healthy and catch a problem early.    Notes: Patient reports he looks at his feet daily for any issues or breakdown.    . COMPLETED: Set My Target A1C       Follow Up Date 01/17/20    - set target A1C    Why is this important?   Your target A1C is decided together by you and your doctor.  It is based on several things like your age and other health issues.    Notes: Patient states he wants to keep his A1c below 7.5. Current A1c is 7.1 on 11/28/19      Plan: [place] will send PCP today's assessment note, will call the patient on 01/23/20 at 0900 to inquire about his blood sugar readings, and patient agrees to future outreach calls.   [name] RN, BSN [place] [phone] [email or web address]

## 2020-01-23 ENCOUNTER — Other Ambulatory Visit: Payer: Self-pay | Admitting: *Deleted

## 2020-01-23 NOTE — Patient Outreach (Signed)
[facility]) Care Management  [place]  01/23/2020   Allen Hunter. 18-Feb-1941 [id number]  Subjective: Successful telephone outreach call to patient. HIPAA identifiers obtained. Nurse called patient today to follow-up to ensure that his blood sugar values were improving. Patient stated that his blood sugar this morning was 151, on 10/31 his blood sugar was 143 and 170, on 10/30 his blood sugar was 203 and 246, and on 10/29 his blood sugar was 173 and 190. Patient verbalizes that they have improved but realizes that he needs to continue to make the effort to lower his blood sugar. Per patient he will use the diabetic plate method and fill half of his plate with non-starchy vegetables, he will start to read food labels to keep his carbohydrates < 60 grams per meal and < 30 gram per snack, he will start to walk twice weekly for about a half of a mile, he will reduce the amount of coffee he drinks to 1-2 cups and will increase the amount of water he drinks to the goal of 3 bottles a day. Patient reports that he has not been writing down his blood sugar values in a log and he goes back to his meter to see his values. Nurse provided education on the importance of  him to write them in a log to be able to easily see how well his diabetes is in control or not, patient voiced understanding and stated he would start to write his blood sugars down daily. Patient reports that he will work on his goals above and did confirm that he would call this nurse for further questions or concerns.   Encounter Medications:  Outpatient Encounter Medications as of 01/23/2020  Medication Sig  . allopurinol (ZYLOPRIM) 300 MG tablet TAKE 1 TABLET DAILY  . aspirin 81 MG tablet Take 81 mg by mouth daily.    Marland Kitchen atorvastatin (LIPITOR) 80 MG tablet TAKE 1 TABLET DAILY  . bisoprolol (ZEBETA) 5 MG tablet Take 1 tablet (5 mg total) by mouth daily.  . Dulaglutide (TRULICITY) 8.58 IF/0.2DX SOPN Inject 0.5 mLs  (0.75 mg total) into the skin once a week.  . esomeprazole (NEXIUM) 40 MG capsule TAKE 1 CAPSULE DAILY  . fluticasone (FLONASE) 50 MCG/ACT nasal spray Place 1 spray into both nostrils daily.  . Fluticasone-Umeclidin-Vilant (TRELEGY ELLIPTA) 100-62.5-25 MCG/INH AEPB Inhale 1 puff into the lungs daily.  Marland Kitchen FREESTYLE LITE test strip USE 1 STRIP TWICE A DAY AS NEEDED TO CHECK SUGAR  . glipiZIDE (GLUCOTROL XL) 10 MG 24 hr tablet TAKE 1 TABLET DAILY WITH BREAKFAST  . JARDIANCE 25 MG TABS tablet TAKE 1 TABLET DAILY  . Lactulose 20 GM/30ML SOLN 30 ml every 8 hrs as needed  . Lancets MISC Use asd 1 per day 250.02  . metFORMIN (GLUCOPHAGE) 1000 MG tablet TAKE 1 TABLET TWICE A DAY WITH A MEAL  . montelukast (SINGULAIR) 10 MG tablet TAKE 1 TABLET (10MG  TOTAL ) BY MOUTH AT BEDTIME  . Multiple Vitamins-Minerals (CENTRUM MEN PO) Take 1 tablet by mouth daily.  . Omega-3 Fatty Acids (FISH OIL) 500 MG CAPS Take by mouth daily.    . sildenafil (VIAGRA) 100 MG tablet TAKE ONE-HALF (1/2) TO ONE TABLET (50 MG TO 100 MG) DAILY AS NEEDED FOR ERECTILE DYSFUNCTION  . tamsulosin (FLOMAX) 0.4 MG CAPS capsule Take 1 capsule (0.4 mg total) by mouth daily.  . trospium (SANCTURA) 20 MG tablet Take 20 mg by mouth 2 (two) times daily.  Marland Kitchen  vitamin B-12 (CYANOCOBALAMIN) 1000 MCG tablet Take 1,000 mcg by mouth daily.   No facility-administered encounter medications on file as of 01/23/2020.    Functional Status:  In your present state of health, do you have any difficulty performing the following activities: 08/08/2019 06/15/2019  Hearing? N N  Vision? N N  Difficulty concentrating or making decisions? Y N  Walking or climbing stairs? N N  Dressing or bathing? N N  Doing errands, shopping? N N  Preparing Food and eating ? N N  Using the Toilet? N N  In the past six months, have you accidently leaked urine? N Y  Comment - Hx. of prostate cancer followed by Dr. Ivery Quale  Do you have problems with loss of bowel control? N N   Managing your Medications? N N  Managing your Finances? N N  Housekeeping or managing your Housekeeping? N N  Some recent data might be hidden    Fall/Depression Screening: Fall Risk  01/17/2020 01/17/2020 11/28/2019  Falls in the past year? 1 1 0  Number falls in past yr: 0 0 -  Injury with Fall? 0 0 -  Risk for fall due to : History of fall(s) - -  Follow up Falls evaluation completed;Education provided;Falls prevention discussed Falls prevention discussed;Education provided;Falls evaluation completed -   PHQ 2/9 Scores 11/28/2019 08/08/2019 06/15/2019 05/10/2019 05/10/2019 08/02/2018 11/17/2017  PHQ - 2 Score 0 0 0 0 0 0 0  PHQ- 9 Score - - - - - - -    Assessment:  Goals Addressed            This Visit's Progress   . Learn More About My Health       Follow Up Date 03/21/20   - make a list of questions - ask questions - repeat what I heard to make sure I understand - bring a list of my medicines to the visit - speak up when I don't understand    Why is this important?   The best way to learn about your health and care is by talking to the doctor and nurse.  They will answer your questions and give you information in the way that you like best.    Notes: Patient states that he will continue to read the diabetes education that this nurse sent him previously. On 01/17/20 Nurse and patient reviewed diabetes education together with the patient reading from a diabetes educational booklet.   Updated: 01/23/20    . Monitor and Manage My Blood Sugar       Follow Up Date 03/21/20   - check blood sugar at prescribed times - check blood sugar if I feel it is too high or too low - enter blood sugar readings and medication or insulin into daily log - take the blood sugar log to all doctor visits     Why is this important?   Checking your blood sugar at home helps to keep it from getting very high or very low.  Writing the results in a diary or log helps the doctor know how to care  for you.  Your blood sugar log should have the time, date and the results.  Also, write down the amount of insulin or other medicine that you take.  Other information, like what you ate, exercise done and how you were feeling, will also be helpful.     Notes: Patient reports that he will start to write his blood sugar values in a log so he  can look quickly to know how he is controlling his diabetes. He is motivated to start to look at food labels and  limit his carbohydrates < 60 grams per meal and <30 grams per snack, he states he wants to start to walk twice weekly for about a half of a mile, patient states he will use the diabetic plate method and will fill half of his plate with vegetables, patient wants to decrease the amount of coffee he drinks  to 1-2 cups daily and increase his water intake to the goal of 3 bottles daily.  Updated: 01/23/20      Plan: RN Health Coach will call patient within the month of December and patient agrees to future outreach calls.   [name] RN, BSN [place] [phone] [email or web address]

## 2020-01-30 ENCOUNTER — Encounter: Payer: Self-pay | Admitting: Pulmonary Disease

## 2020-01-30 ENCOUNTER — Other Ambulatory Visit: Payer: Self-pay

## 2020-01-30 ENCOUNTER — Ambulatory Visit (INDEPENDENT_AMBULATORY_CARE_PROVIDER_SITE_OTHER): Payer: Medicare Other | Admitting: Pulmonary Disease

## 2020-01-30 VITALS — BP 108/78 | HR 85 | Ht 65.0 in | Wt 170.8 lb

## 2020-01-30 DIAGNOSIS — R0600 Dyspnea, unspecified: Secondary | ICD-10-CM

## 2020-01-30 DIAGNOSIS — J449 Chronic obstructive pulmonary disease, unspecified: Secondary | ICD-10-CM

## 2020-01-30 DIAGNOSIS — G4733 Obstructive sleep apnea (adult) (pediatric): Secondary | ICD-10-CM

## 2020-01-30 DIAGNOSIS — R0609 Other forms of dyspnea: Secondary | ICD-10-CM

## 2020-01-30 MED ORDER — STIOLTO RESPIMAT 2.5-2.5 MCG/ACT IN AERS: 2.0000 | INHALATION_SPRAY | Freq: Every day | RESPIRATORY_TRACT | 0 refills | Status: AC

## 2020-01-30 NOTE — Patient Instructions (Signed)
We are giving you a trial of Stiolto 2 inhalations daily, let us know how this medication does for you so we can call the prescription in to your pharmacy of choice.  Continue follow-up at the [facility] for your kidney issues.  You are going to get breathing tests done.  We will see you in follow-up in 3 months time call sooner should any new difficulties arise.

## 2020-01-30 NOTE — Progress Notes (Signed)
Subjective:    Patient ID: Allen Hunter., male    DOB: 25-Nov-1940, 79 y.o.   MRN: [id number]  HPI Patient is an 79 year old former smoker who presents for evaluation of anemia and abnormal CT of the chest.  He is kindly referred by Dr. Cathlean Cower.  Patient states that he has been having "pulmonary problems" for about 3 years manifested as shortness of breath.  He had been previously following up with Dr. Wallene Huh.  Recently had a CT scan of the chest that showed no interstitial lung disease but did show some peribronchovascular groundglass as well as pulmonary nodules.  I have reviewed the most recent CT scan of the chest which was performed in September 2029 and this is consistent with inflammatory change.  Patient has not noticed any orthopnea or paroxysmal nocturnal dyspnea.  No chest pain.  He does get "winded" on heavy exertion.  He has noted some weight loss but cannot attribute what this may be due to.  He does have gastroesophageal reflux symptoms almost daily.  Patient has a history of obstructive sleep apnea but has not been wearing his CPAP stating that the CPAP malfunctions.  He has not been wearing it a long time" he was supposed to have a sleep study performed in August however he did not follow through with this.  Had pulmonary function testing performed by Dr. Raul Del last of which was in August 2021.  This showed FEV1 of 0.93 L or 50% predicted, FVC of 1.81 L or 75% predicted, FEV1/FVC of 52% delayed flow volume loop consistent with severe obstruction.  Most recent echocardiogram has not shown pulmonary hypertension.   Review of Systems A 10 point review of systems was performed and it is as noted above otherwise negative.  Past Medical History:  Diagnosis Date  . ALLERGIC RHINITIS 10/04/2008  . ASTHMA 10/04/2008  . Asthma   . BACK PAIN 04/18/2008  . BENIGN PROSTATIC HYPERTROPHY 03/09/2007  . COLONIC POLYPS, HX OF 03/09/2007  . COPD 10/20/2007  . DIABETES MELLITUS,  TYPE II 03/09/2007  . FATIGUE 10/05/2007  . GERD (gastroesophageal reflux disease) 03/01/2012  . GOUT 10/19/2006  . HYPERLIPIDEMIA 10/19/2006  . HYPERTENSION 10/19/2006  . OSA (obstructive sleep apnea) 07/19/2015  . Prostate cancer ([place]) 01/19/2012  . SWELLING MASS OR LUMP IN HEAD AND NECK 01/09/2010   Patient Active Problem List   Diagnosis Date Noted  . Constipation 05/10/2019  . Chronic diastolic CHF (congestive heart failure) ([place]) 11/15/2018  . DOE (dyspnea on exertion) 05/17/2018  . OSA (obstructive sleep apnea) 07/19/2015  . Asthma with acute exacerbation 04/10/2014  . Unspecified asthma, with exacerbation 09/05/2013  . GERD (gastroesophageal reflux disease) 03/01/2012  . Diarrhea 03/01/2012  . Hypersomnolence 01/19/2012  . Prostate cancer ([place]) 01/19/2012  . 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 Increased prostate specific antigen (PSA) velocity 01/21/2011  . Erectile dysfunction 01/20/2011  . Bladder neck obstruction 01/20/2011  . Preventative health care 01/18/2011  . Localized swelling, mass and lump, head 01/09/2010  . Allergic rhinitis 10/04/2008  . Asthma 10/04/2008  . BACK PAIN 04/18/2008  . COPD (chronic obstructive pulmonary disease) ([place]) 10/20/2007  . Fatigue 10/05/2007  . Diabetes ([place]) 03/09/2007  . BENIGN PROSTATIC HYPERTROPHY 03/09/2007  . COLONIC POLYPS, HX OF 03/09/2007  . Hyperlipidemia 10/19/2006  . GOUT 10/19/2006  . Essential hypertension 10/19/2006   Past Surgical History:  Procedure Laterality Date  . APPENDECTOMY     Family History  Problem Relation Age of Onset  .  Hypertension Mother   . Cancer Other    Social History   Tobacco Use  . Smoking status: Former Smoker    Packs/day: 1.50    Years: 32.00    Pack years: 48.00    Types: Cigarettes    Quit date: 1987    Years since quitting: 34.8  . Smokeless tobacco: Never Used  Substance Use Topics  . 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  Did have overseas deployments.  Retired 1986.  Worked at a Water quality scientist company  No Known Allergies  Current Meds  Medication Sig  . aspirin 81 MG tablet Take 81 mg by mouth daily.  Marland Kitchen atorvastatin (LIPITOR) 80 MG tablet TAKE 1 TABLET DAILY  . Dulaglutide (TRULICITY) 1.63 WG/6.6ZL SOPN Inject 0.5 mLs (0.75 mg total) into the skin once a week.  . esomeprazole (NEXIUM) 40 MG capsule TAKE 1 CAPSULE DAILY  . fluticasone (FLONASE) 50 MCG/ACT nasal spray Place 1 spray into both nostrils daily. (Patient not taking: Reported on 06/11/2020)  . FREESTYLE LITE test strip USE 1 STRIP TWICE A DAY AS NEEDED TO CHECK SUGAR  . JARDIANCE 25 MG TABS tablet TAKE 1 TABLET DAILY  . Lancets MISC Use asd 1 per day 250.02  . montelukast (SINGULAIR) 10 MG tablet TAKE 1 TABLET (10MG  TOTAL ) BY MOUTH AT BEDTIME  . Multiple Vitamins-Minerals (CENTRUM MEN PO) Take 1 tablet by mouth daily.  . Omega-3 Fatty Acids (FISH OIL) 500 MG CAPS Take by mouth daily.  . tamsulosin (FLOMAX) 0.4 MG CAPS capsule Take 1 capsule (0.4 mg total) by mouth daily. (Patient not taking: Reported on 06/11/2020)  . vitamin B-12 (CYANOCOBALAMIN) 1000 MCG tablet Take 1,000 mcg by mouth daily.  . [DISCONTINUED] allopurinol (ZYLOPRIM) 300 MG tablet TAKE 1 TABLET DAILY  . [DISCONTINUED] bisoprolol (ZEBETA) 5 MG tablet Take 1 tablet (5 mg total) by mouth daily.  . [DISCONTINUED] glipiZIDE (GLUCOTROL XL) 10 MG 24 hr tablet TAKE 1 TABLET DAILY WITH BREAKFAST  . [DISCONTINUED] metFORMIN (GLUCOPHAGE) 1000 MG tablet TAKE 1 TABLET TWICE A DAY WITH A MEAL  . [DISCONTINUED] sildenafil (VIAGRA) 100 MG tablet TAKE ONE-HALF (1/2) TO ONE TABLET (50 MG TO 100 MG) DAILY AS NEEDED FOR ERECTILE DYSFUNCTION   Immunization History  Administered Date(s) Administered  . H1N1 04/02/2008  . Influenza Whole 03/09/2007, 12/27/2007, 01/21/2009  . Influenza, High Dose Seasonal PF 11/18/2016  . Influenza, Seasonal, Injecte, Preservative Fre 02/02/2013  .  Influenza,inj,Quad PF,6+ Mos 01/18/2015  . Influenza-Unspecified 01/15/2016, 01/19/2018, 11/01/2018, 12/22/2019  . PFIZER(Purple Top)SARS-COV-2 Vaccination 05/05/2019, 06/02/2019  . Pneumococcal Conjugate-13 04/04/2013, 11/01/2018  . Pneumococcal Polysaccharide-23 03/09/2007  . Td 04/02/2008  . Tdap 05/17/2018  . Zoster 03/07/2013       Objective:   Physical Exam BP 108/78 (BP Location: Left Arm, Patient Position: Sitting, Cuff Size: Normal)   Pulse 85   Ht 5\' 5"  (1.651 m)   Wt 170 lb 12.8 oz (77.5 kg)   SpO2 98%   BMI 28.42 kg/m  GENERAL: Overweight elderly gentleman, no acute distress. HEAD: Normocephalic, atraumatic.  EYES: Pupils equal, round, reactive to light.  No scleral icterus.  MOUTH: Nose/mouth/throat not examined due to masking requirements for COVID 19. NECK: Supple. No thyromegaly. Trachea midline. No JVD.  No adenopathy. PULMONARY: Distant breath sounds bilaterally.  Coarse with no other adventitious sounds. CARDIOVASCULAR: S1 and S2. Regular rate and rhythm.  ABDOMEN: Slightly protuberant,  otherwise benign. MUSCULOSKELETAL: No joint deformity, no clubbing, no edema.  NEUROLOGIC: No focal deficit, no gait disturbance, speech is fluent. SKIN: Intact,warm,dry.  No overt rashes noted. PSYCH: Mood and behavior normal.  Ambulatory symmetry was performed today: At rest oxygen saturation was 97%.  O2 nadir was 93%.  Patient was able to do 3 laps without difficulty.     Assessment & Plan:     ICD-10-CM   1. DOE (dyspnea on exertion)  R06.00 Pulse oximetry, overnight    Pulmonary Function Test [facility] Only   Will need repeat PFTs. He does have issues with  2. Stage 3 severe COPD by GOLD classification (HCC)  J44.9 Pulse oximetry, overnight   Trial of Stiolto 2 puffs daily  3. OSA (obstructive sleep apnea)  G47.33    Currently not on CPAP States machine broken Missed last appointment for sleep study Not sure if he would want to resume   Orders Placed This  Encounter  Procedures  . Pulse oximetry, overnight    On room air    Standing Status:   Future    Standing Expiration Date:   01/29/2021  . Pulmonary Function Test [facility] Only    Next available    Standing Status:   Future    Number of Occurrences:   1    Standing Expiration Date:   01/29/2021    Order Specific Question:   Full PFT: includes the following: basic spirometry, spirometry pre & post bronchodilator, diffusion capacity (DLCO), lung volumes    Answer:   Full PFT   Meds ordered this encounter  Medications  . Tiotropium Bromide-Olodaterol (STIOLTO RESPIMAT) 2.5-2.5 MCG/ACT AERS    Sig: Inhale 2 puffs into the lungs daily for 1 day.    Dispense:  4 g    Refill:  0    Order Specific Question:   Lot Number?    Answer:   [id number]    Order Specific Question:   Expiration Date?    Answer:   10/21/2021   Discussion:  Patient is an 79 year old with stage III COPD.  He has issues with dyspnea.  Suspect poor compensation of COPD as he is not currently on any inhalers.  He had been diagnosed previously with potential interstitial lung disease however this is not supported by last CT.  He has some groundglass opacities that appear to be inflammatory and will needs repeat CT in September 2022.  He has a history of obstructive sleep apnea but has not worn CPAP in "a long time".  He was supposed to have a sleep study in August but missed this study.  He is not sure that he wants to pursue this.  We will give him a trial of Stiolto 2 puffs daily to see if this helps with his dyspnea.  He is to let us know if this medication helps we can call it in to his pharmacy.  To let us know if you would like to pursue sleep study.  We will see the patient in follow-up in 3 months time to contact us prior to that time should any new difficulties arise.  Renold Don, MD [facility]   *This note was dictated using voice recognition software/Dragon.  Despite best efforts to proofread, errors can  occur which can change the meaning.  Any change was purely unintentional.

## 2020-02-22 ENCOUNTER — Other Ambulatory Visit: Payer: Self-pay | Admitting: *Deleted

## 2020-02-22 DIAGNOSIS — R0683 Snoring: Secondary | ICD-10-CM | POA: Diagnosis not present

## 2020-02-22 DIAGNOSIS — G4730 Sleep apnea, unspecified: Secondary | ICD-10-CM | POA: Diagnosis not present

## 2020-02-22 NOTE — Patient Outreach (Signed)
[facility]) Care Management  02/22/2020  Allen Hunter. [birth year] [id number]  Unsuccessful outreach attempt made to patient. RN Health Coach left HIPAA compliant voicemail message along with her contact information.  Plan: RN Health Coach will call patient within the month of January.  [name] RN, BSN [place] [phone] [email or web address]

## 2020-02-23 ENCOUNTER — Ambulatory Visit: Payer: Self-pay | Admitting: *Deleted

## 2020-03-26 ENCOUNTER — Other Ambulatory Visit: Payer: Self-pay | Admitting: *Deleted

## 2020-03-26 NOTE — Patient Outreach (Signed)
[facility]) Care Management  03/26/2020  Allen Hunter. 02/15/1941 [id number]  Unsuccessful outreach attempt made to patient. Patient answered the phone and stated he was unable to talk today. He did request that this nurse call him back at a later date.   Plan: RN Health Coach will call patient within the month of February.  Blanchie Serve RN, BSN [facility] Care Management  RN Health Coach [phone] [email or web address]

## 2020-04-18 ENCOUNTER — Ambulatory Visit: Payer: TRICARE For Life (TFL) | Admitting: Physician Assistant

## 2020-04-18 ENCOUNTER — Other Ambulatory Visit: Payer: Self-pay

## 2020-04-18 ENCOUNTER — Encounter: Payer: Self-pay | Admitting: Cardiovascular Disease

## 2020-04-18 ENCOUNTER — Ambulatory Visit (INDEPENDENT_AMBULATORY_CARE_PROVIDER_SITE_OTHER): Payer: Medicare HMO | Admitting: Cardiovascular Disease

## 2020-04-18 VITALS — BP 134/92 | HR 63 | Ht 65.0 in | Wt 172.0 lb

## 2020-04-18 DIAGNOSIS — I5032 Chronic diastolic (congestive) heart failure: Secondary | ICD-10-CM

## 2020-04-18 DIAGNOSIS — I10 Essential (primary) hypertension: Secondary | ICD-10-CM | POA: Diagnosis not present

## 2020-04-18 MED ORDER — SILDENAFIL CITRATE 100 MG PO TABS: ORAL_TABLET | ORAL | 6 refills | Status: DC

## 2020-04-18 NOTE — Progress Notes (Deleted)
Cardiology Office Note:    Date:  04/18/2020   ID:  Allen Dakin., DOB 12/30/1940, MRN [id number]  PCP:  [name], MD  [facility] Cardiologist:  Mertie Moores, MD  [place] Electrophysiologist:  None   Chief Complaint: 6 month follow up   History of Present Illness:    Allen Emel. is a 80 y.o. male with a hx of chronic diastolic CHF, COPD (followed by pulmonologist), OSA on CPAP, HTN, HLD, DM and PAC seen for follow up.   Hx of DOE. Echo 05/2018 showed LVEF 60-65% and grade 1 DD. Coronary CT 11/2018 showed calcium score of 45. Minimal non obstructive CAD. Moderately dilated pulmonary artery measuring 38 mm suggestive of pulmonary hypertension. Scattered tiny bilateral solid and ground-glass pulmonary nodules, similar to the dedicated CT chest of 06/07/2018.   Seen 08/2019 with gradual worsening DOE. Echo 08/2019 showed normal pumping function of heart with mild stiffness of heart muscle. No significant valvular disease.  Last seen by Dr. Acie Fredrickson [id number]/20221>>> recommended diatery salt reduction.   Here today for follow up.   Past Medical History:  Diagnosis Date  . ALLERGIC RHINITIS 10/04/2008  . ASTHMA 10/04/2008  . Asthma   . BACK PAIN 04/18/2008  . BENIGN PROSTATIC HYPERTROPHY 03/09/2007  . COLONIC POLYPS, HX OF 03/09/2007  . COPD 10/20/2007  . DIABETES MELLITUS, TYPE II 03/09/2007  . FATIGUE 10/05/2007  . GERD (gastroesophageal reflux disease) 03/01/2012  . GOUT 10/19/2006  . HYPERLIPIDEMIA 10/19/2006  . HYPERTENSION 10/19/2006  . OSA (obstructive sleep apnea) 07/19/2015  . Prostate cancer ([place]) 01/19/2012  . SWELLING MASS OR LUMP IN HEAD AND NECK 01/09/2010    Past Surgical History:  Procedure Laterality Date  . APPENDECTOMY      Current Medications: No outpatient medications have been marked as taking for the 04/18/20 encounter (Appointment) with Leanor Kail, [place].     Allergies:   Patient has no known allergies.   Social History    Socioeconomic History  . Marital status: Single    Spouse name: Not on file  . Number of children: Not on file  . Years of education: Not on file  . Highest education level: Bachelor's degree (e.g., BA, AB, BS)  Occupational History  . Occupation: retired [employer] aug 2009  Tobacco Use  . Smoking status: Former Smoker    Packs/day: 1.50    Years: 32.00    Pack years: 48.00    Types: Cigarettes    Quit date: 1987    Years since quitting: 35.0  . Smokeless tobacco: Never Used  Vaping Use  . Vaping Use: Never used  Substance and Sexual Activity  . Alcohol use: No    Comment: stop 7 years  . Drug use: Never  . Sexual activity: Not Currently  Other Topics Concern  . Not on file  Social History Narrative  . Not on file   Social Determinants of Health   Financial Resource Strain: Not on file  Food Insecurity: No Food Insecurity  . Worried About Charity fundraiser in the Last Year: Never true  . Ran Out of Food in the Last Year: Never true  Transportation Needs: No Transportation Needs  . Lack of Transportation (Medical): No  . Lack of Transportation (Non-Medical): No  Physical Activity: Not on file  Stress: Not on file  Social Connections: Not on file     Family History: The patient's family history includes Cancer in an other family member; Hypertension  in his mother.  ***  ROS:   Please see the history of present illness.    All other systems reviewed and are negative. ***  EKGs/Labs/Other Studies Reviewed:    The following studies were reviewed today:  Echo 08/2019 1. Left ventricular ejection fraction, by estimation, is 60 to 65%. The  left ventricle has normal function. The left ventricle has no regional  wall motion abnormalities. Left ventricular diastolic parameters are  consistent with Grade I diastolic  dysfunction (impaired relaxation).  2. Right ventricular systolic function is normal. The right ventricular  size is normal. Tricuspid  regurgitation signal is inadequate for assessing  PA pressure.  3. Left atrial size was mildly dilated.  4. The mitral valve is grossly normal. No evidence of mitral valve  regurgitation. No evidence of mitral stenosis.  5. The aortic valve is tricuspid. Aortic valve regurgitation is not  visualized. Mild aortic valve sclerosis is present, with no evidence of  aortic valve stenosis.  6. The inferior vena cava is normal in size with greater than 50%  respiratory variability, suggesting right atrial pressure of 3 mmHg.   EKG:  EKG is *** ordered today.  The ekg ordered today demonstrates ***  Recent Labs: 11/28/2019: ALT 18; BUN 32; Creat 1.10; Hemoglobin 14.2; Platelets 228; Potassium 4.5; Sodium 142; TSH 1.59  Recent Lipid Panel    Component Value Date/Time   CHOL 171 11/28/2019 1543   TRIG 95 11/28/2019 1543   TRIG 49 03/30/2006 1021   HDL 53 11/28/2019 1543   CHOLHDL 3.2 11/28/2019 1543   VLDL 16.6 05/10/2019 1444   LDLCALC 99 11/28/2019 1543     Risk Assessment/Calculations:   {Does this patient have ATRIAL FIBRILLATION?:[phone]}   Physical Exam:    VS:  There were no vitals taken for this visit.    Wt Readings from Last 3 Encounters:  01/30/20 170 lb 12.8 oz (77.5 kg)  11/28/19 177 lb (80.3 kg)  10/23/19 179 lb (81.2 kg)     GEN: *** Well nourished, well developed in no acute distress HEENT: Normal NECK: No JVD; No carotid bruits LYMPHATICS: No lymphadenopathy CARDIAC: ***RRR, no murmurs, rubs, gallops RESPIRATORY:  Clear to auscultation without rales, wheezing or rhonchi  ABDOMEN: Soft, non-tender, non-distended MUSCULOSKELETAL:  No edema; No deformity  SKIN: Warm and dry NEUROLOGIC:  Alert and oriented x 3 PSYCHIATRIC:  Normal affect   ASSESSMENT AND PLAN:    1. ***  2. ***  Medication Adjustments/Labs and Tests Ordered: Current medicines are reviewed at length with the patient today.  Concerns regarding medicines are outlined above.  No  orders of the defined types were placed in this encounter.  No orders of the defined types were placed in this encounter.   There are no Patient Instructions on file for this visit.   Allen Hunter, [place]  04/18/2020 8:10 AM    [place]

## 2020-04-18 NOTE — Patient Instructions (Signed)

## 2020-04-18 NOTE — Progress Notes (Signed)
Cardiology Office Note:    Date:  04/18/2020   ID:  Allen Hunter., DOB [birth year], MRN [id number]  PCP:  [name], MD  Cardiologist:  Delton Stelle  Electrophysiologist:  None   Referring MD: [name], MD   Chief Complaint  Patient presents with  . Hypertension  . Congestive Heart Failure     Aug. 25, 2020    Allen Hunter. is a 80 y.o. male with a hx of COPD  ,HTN.  OSA. I met him in April during a telemedicine visit .  Has been eating more canned foods and lots of salty , preserved meats ,  ( [place] sausage,  Spam, bologna, package ham,  [place],  Hot dogs )   he is seen back today for follow up visit Echo from May 23, 2018 revealed normal LV systolic function,  Grade 1 diastolic dysfunction  Seeing him in person for the first time today - first visit was telemedicine     Overall feels well.   Has some DOE , DOE has worsened over the past 18 months .  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   May 23, 2019:  [name] is seen today for follow-up of his COPD, hypertension, obstructive sleep apnea. Coronary CT angiogram revealed tiny bilateral solid and groundglass pulmonary nodules similar to his previous CT scan performed March, 2020.  They recommended follow-up in 6 to 12 months.  Coronary evaluation revealed a coronary calcium score of 45 which is 14th percentile for age and gender matched controls.  His LAD has very minor irregularities.  Left circumflex artery had only luminal irregularities.  The right coronary artery had only minor luminal irregularities.  Echocardiogram from March, 2020 reveals normal left ventricular systolic function.  He has grade 1 diastolic dysfunction.  No significant valvular abnormalities.  No CP ,  Breathing is unchange.   Does not get any exercise   Avoids salt.    October 23, 2019: [name] s seen today for follow-up of his COPD,  hypertension, obstructive sleep apnea.  He has a coronary calcium score 59 which is the 14th percentile for age and gender matched controls.  His CT scan also revealed some groundglass pulmonary nodules but this was similar to his previous CT scan in March 2020.  He was seen by Vin in June, 2021.  No cp  He is still eating lots of salty foods.   He has grade 1 diastolic CHF   April 18, 2020: [name] is seen back today for follow-up visit. Diastolic BP is mildly elevated. Admits to eating more salt than usual .  Has seen Pulmonary in [place]  Would like viagra or similar - no known CAD ,  Has a coronary calcium score 74 which is the 14th percentile for age and gender matched controls  Breathing is about the same      Past Medical History:  Diagnosis Date  . ALLERGIC RHINITIS 10/04/2008  . ASTHMA 10/04/2008  . Asthma   . BACK PAIN 04/18/2008  . BENIGN PROSTATIC HYPERTROPHY 03/09/2007  . COLONIC POLYPS, HX OF 03/09/2007  . COPD 10/20/2007  . DIABETES MELLITUS, TYPE II 03/09/2007  . FATIGUE 10/05/2007  . GERD (gastroesophageal reflux disease) 03/01/2012  . GOUT 10/19/2006  . HYPERLIPIDEMIA 10/19/2006  . HYPERTENSION 10/19/2006  . OSA (obstructive sleep apnea) 07/19/2015  . Prostate cancer ([place])  01/19/2012  . SWELLING MASS OR LUMP IN HEAD AND NECK 01/09/2010    Past Surgical History:  Procedure Laterality Date  . APPENDECTOMY      Current Medications: Current Meds  Medication Sig  . allopurinol (ZYLOPRIM) 300 MG tablet TAKE 1 TABLET DAILY  . aspirin 81 MG tablet Take 81 mg by mouth daily.  Marland Kitchen atorvastatin (LIPITOR) 80 MG tablet TAKE 1 TABLET DAILY  . bisoprolol (ZEBETA) 5 MG tablet Take 1 tablet (5 mg total) by mouth daily.  . Dulaglutide (TRULICITY) 7.62 GB/1.5VV SOPN Inject 0.5 mLs (0.75 mg total) into the skin once a week.  . esomeprazole (NEXIUM) 40 MG capsule TAKE 1 CAPSULE DAILY  . fluticasone (FLONASE) 50 MCG/ACT nasal spray Place 1 spray into both nostrils daily.   . Fluticasone-Umeclidin-Vilant (TRELEGY ELLIPTA) 100-62.5-25 MCG/INH AEPB Inhale 1 puff into the lungs daily.  Marland Kitchen FREESTYLE LITE test strip USE 1 STRIP TWICE A DAY AS NEEDED TO CHECK SUGAR  . glipiZIDE (GLUCOTROL XL) 10 MG 24 hr tablet TAKE 1 TABLET DAILY WITH BREAKFAST  . JARDIANCE 25 MG TABS tablet TAKE 1 TABLET DAILY  . Lactulose 20 GM/30ML SOLN 30 ml every 8 hrs as needed  . Lancets MISC Use asd 1 per day 250.02  . metFORMIN (GLUCOPHAGE) 1000 MG tablet TAKE 1 TABLET TWICE A DAY WITH A MEAL  . montelukast (SINGULAIR) 10 MG tablet TAKE 1 TABLET (10MG TOTAL ) BY MOUTH AT BEDTIME  . Multiple Vitamins-Minerals (CENTRUM MEN PO) Take 1 tablet by mouth daily.  . Omega-3 Fatty Acids (FISH OIL) 500 MG CAPS Take by mouth daily.  . tamsulosin (FLOMAX) 0.4 MG CAPS capsule Take 1 capsule (0.4 mg total) by mouth daily.  . trospium (SANCTURA) 20 MG tablet Take 20 mg by mouth 2 (two) times daily.  . vitamin B-12 (CYANOCOBALAMIN) 1000 MCG tablet Take 1,000 mcg by mouth daily.  . [DISCONTINUED] sildenafil (VIAGRA) 100 MG tablet TAKE ONE-HALF (1/2) TO ONE TABLET (50 MG TO 100 MG) DAILY AS NEEDED FOR ERECTILE DYSFUNCTION     Allergies:   Patient has no known allergies.   Social History   Socioeconomic History  . Marital status: Single    Spouse name: Not on file  . Number of children: Not on file  . Years of education: Not on file  . Highest education level: Bachelor's degree (e.g., BA, AB, BS)  Occupational History  . Occupation: retired [employer] aug 2009  Tobacco Use  . Smoking status: Former Smoker    Packs/day: 1.50    Years: 32.00    Pack years: 48.00    Types: Cigarettes    Quit date: 1987    Years since quitting: 35.0  . Smokeless tobacco: Never Used  Vaping Use  . Vaping Use: Never used  Substance and Sexual Activity  . Alcohol use: No    Comment: stop 7 years  . Drug use: Never  . Sexual activity: Not Currently  Other Topics Concern  . Not on file  Social History  Narrative  . Not on file   Social Determinants of Health   Financial Resource Strain: Not on file  Food Insecurity: No Food Insecurity  . Worried About Charity fundraiser in the Last Year: Never true  . Ran Out of Food in the Last Year: Never true  Transportation Needs: No Transportation Needs  . Lack of Transportation (Medical): No  . Lack of Transportation (Non-Medical): No  Physical Activity: Not on file  Stress: Not on  file  Social Connections: Not on file     Family History: The patient's family history includes Cancer in an other family member; Hypertension in his mother.  ROS:   Please see the history of present illness.     All other systems reviewed and are negative.  EKGs/Labs/Other Studies Reviewed:    The following studies were reviewed today:   EKG:   April 13, 2020: Normal sinus rhythm at 63.  No ST or T wave changes.  Recent Labs: 11/28/2019: ALT 18; BUN 32; Creat 1.10; Hemoglobin 14.2; Platelets 228; Potassium 4.5; Sodium 142; TSH 1.59  Recent Lipid Panel    Component Value Date/Time   CHOL 171 11/28/2019 1543   TRIG 95 11/28/2019 1543   TRIG 49 03/30/2006 1021   HDL 53 11/28/2019 1543   CHOLHDL 3.2 11/28/2019 1543   VLDL 16.6 05/10/2019 1444   LDLCALC 99 11/28/2019 1543    Physical Exam:    Physical Exam: Blood pressure (!) 134/92, pulse 63, height '5\' 5"'  (1.651 m), weight 172 lb (78 kg), SpO2 95 %.  GEN:  Well nourished, well developed in no acute distress HEENT: Normal NECK: No JVD; No carotid bruits LYMPHATICS: No lymphadenopathy CARDIAC: RRR  RESPIRATORY:  Clear to auscultation without rales, wheezing or rhonchi  ABDOMEN: Soft, non-tender, non-distended MUSCULOSKELETAL:  No edema; No deformity  SKIN: Warm and dry NEUROLOGIC:  Alert and oriented x 3   ASSESSMENT:    1. Chronic diastolic CHF (congestive heart failure) (HCC)    PLAN:      1.  DOE:        Likely due to lung disease .  He is stable from a cardiac standpoint   2.   Premature ventricular contractions :    Stable  3.  Hypertension: Blood pressures well controlled.  4.  ED: Given him prescription for sildenafil 100 mg tablets to take as needed.  We discussed that I would be retiring in the next several years.  He lives in [place].  I think we should establish him with one of the younger cardiologist in our [place] office.  Medication Adjustments/Labs and Tests Ordered: Current medicines are reviewed at length with the patient today.  Concerns regarding medicines are outlined above.  Orders Placed This Encounter  Procedures  . EKG 12-Lead   Meds ordered this encounter  Medications  . sildenafil (VIAGRA) 100 MG tablet    Sig: Take one tablet by mouth daily as needed    Dispense:  10 tablet    Refill:  6    Patient Instructions  Medication Instructions:  Your physician recommends that you continue on your current medications as directed. Please refer to the Current Medication list given to you today.  *If you need a refill on your cardiac medications before your next appointment, please call your pharmacy*   Lab Work: None If you have labs (blood work) drawn today and your tests are completely normal, you will receive your results only by: Marland Kitchen MyChart Message (if you have MyChart) OR . A paper copy in the mail If you have any lab test that is abnormal or we need to change your treatment, we will call you to review the results.   Testing/Procedures: None   Follow-Up: At [facility], you and your health needs are our priority.  As part of our continuing mission to provide you with exceptional heart care, we have created designated Provider Care Teams.  These Care Teams include your primary Cardiologist (physician) and Advanced  Practice Providers (APPs -  Physician Assistants and Nurse Practitioners) who all work together to provide you with the care you need, when you need it.  We recommend signing up for the patient portal called  "MyChart".  Sign up information is provided on this After Visit Summary.  MyChart is used to connect with patients for Virtual Visits (Telemedicine).  Patients are able to view lab/test results, encounter notes, upcoming appointments, etc.  Non-urgent messages can be sent to your provider as well.   To learn more about what you can do with MyChart, go to [email or web address].    Your next appointment:   1 year(s)  The format for your next appointment:   In Person  Provider:   You may see Mertie Moores, MD or one of the following Advanced Practice Providers on your designated Care Team:    Richardson Dopp, PA-C  Robbie Lis, [place]    Other Instructions      Signed, Mertie Moores, MD  04/18/2020 6:11 PM    [place]

## 2020-04-19 DIAGNOSIS — D49512 Neoplasm of unspecified behavior of left kidney: Secondary | ICD-10-CM | POA: Diagnosis not present

## 2020-04-19 DIAGNOSIS — C649 Malignant neoplasm of unspecified kidney, except renal pelvis: Secondary | ICD-10-CM | POA: Diagnosis not present

## 2020-04-19 DIAGNOSIS — J9811 Atelectasis: Secondary | ICD-10-CM | POA: Diagnosis not present

## 2020-04-22 ENCOUNTER — Other Ambulatory Visit: Payer: Self-pay | Admitting: Cardiovascular Disease

## 2020-04-26 DIAGNOSIS — R3912 Poor urinary stream: Secondary | ICD-10-CM | POA: Diagnosis not present

## 2020-04-26 DIAGNOSIS — D49512 Neoplasm of unspecified behavior of left kidney: Secondary | ICD-10-CM | POA: Diagnosis not present

## 2020-04-26 DIAGNOSIS — N401 Enlarged prostate with lower urinary tract symptoms: Secondary | ICD-10-CM | POA: Diagnosis not present

## 2020-04-26 DIAGNOSIS — C61 Malignant neoplasm of prostate: Secondary | ICD-10-CM | POA: Diagnosis not present

## 2020-04-29 DIAGNOSIS — Z03818 Encounter for observation for suspected exposure to other biological agents ruled out: Secondary | ICD-10-CM | POA: Diagnosis not present

## 2020-04-29 DIAGNOSIS — U071 COVID-19: Secondary | ICD-10-CM | POA: Diagnosis not present

## 2020-04-29 DIAGNOSIS — Z20822 Contact with and (suspected) exposure to covid-19: Secondary | ICD-10-CM | POA: Diagnosis not present

## 2020-04-29 DIAGNOSIS — J310 Chronic rhinitis: Secondary | ICD-10-CM | POA: Diagnosis not present

## 2020-04-29 DIAGNOSIS — J329 Chronic sinusitis, unspecified: Secondary | ICD-10-CM | POA: Diagnosis not present

## 2020-05-14 ENCOUNTER — Other Ambulatory Visit: Payer: Self-pay | Admitting: *Deleted

## 2020-05-14 ENCOUNTER — Telehealth: Payer: Self-pay | Admitting: Internal Medicine

## 2020-05-14 NOTE — Telephone Encounter (Signed)
Patient states he was given a sample of the myrbetriq and it helped him a lot and he is wondering if we could send him a prescription for it.  [id number] [address], [place] AT [address] Phone:  [phone]  Fax:  [phone]-

## 2020-05-14 NOTE — Patient Outreach (Signed)
[facility]) Care Management  [place]  05/15/2020   Allen Hunter 01-10-1941 [id number]  Subjective: Successful telephone outreach call to patient. HIPAA identifiers obtained. Patient states that his blood sugars have been running high recently with values often in the 200-300 range. Patient admits that he has not been monitoring his diet closely for carbohydrates, sweets, or sodium. Patient explains that he does not write his blood sugars down in a log booklet. Nurse provide education regarding diabetes care and the importance of writing down his blood sugar values so the patient can follow a trend of his diabetes control. Patient verbally agreed and stated he would begin to write down his values; nurse will send him a calendar booklet. Nurse reviewed patient's medications and patient reported not taking Trelegy , he did state that he is taking his Trulicity weekly, and his other medications as prescribed. Nurse did explain that Trelegy is daily and will send patient a pillbox to organize his medications. Patient explains that his C-PAP is broken and nurse is assisting with replacing the device. Nurse has left several messages for the patient to call her back regarding various questions that need answering before she reaches out to his pulmonologist. Patient states he has not had any recent falls and his environment is safe. Patient did not have any further questions or concerns today and did confirm that the patient has this nurse's contact number to call her if needed.   Encounter Medications:  Outpatient Encounter Medications as of 05/14/2020  Medication Sig Note  . allopurinol (ZYLOPRIM) 300 MG tablet TAKE 1 TABLET DAILY   . aspirin 81 MG tablet Take 81 mg by mouth daily.   Marland Kitchen atorvastatin (LIPITOR) 80 MG tablet TAKE 1 TABLET DAILY   . bisoprolol (ZEBETA) 5 MG tablet TAKE 1 TABLET DAILY   . Dulaglutide (TRULICITY) 6.94 WN/4.6EV SOPN Inject 0.5 mLs (0.75 mg total)  into the skin once a week.   . esomeprazole (NEXIUM) 40 MG capsule TAKE 1 CAPSULE DAILY   . fluticasone (FLONASE) 50 MCG/ACT nasal spray Place 1 spray into both nostrils daily.   Marland Kitchen FREESTYLE LITE test strip USE 1 STRIP TWICE A DAY AS NEEDED TO CHECK SUGAR   . glipiZIDE (GLUCOTROL XL) 10 MG 24 hr tablet TAKE 1 TABLET DAILY WITH BREAKFAST   . JARDIANCE 25 MG TABS tablet TAKE 1 TABLET DAILY   . Lactulose 20 GM/30ML SOLN 30 ml every 8 hrs as needed   . Lancets MISC Use asd 1 per day 250.02   . metFORMIN (GLUCOPHAGE) 1000 MG tablet TAKE 1 TABLET TWICE A DAY WITH A MEAL   . montelukast (SINGULAIR) 10 MG tablet TAKE 1 TABLET (10MG  TOTAL ) BY MOUTH AT BEDTIME   . Multiple Vitamins-Minerals (CENTRUM MEN PO) Take 1 tablet by mouth daily.   . Omega-3 Fatty Acids (FISH OIL) 500 MG CAPS Take by mouth daily.   . sildenafil (VIAGRA) 100 MG tablet Take one tablet by mouth daily as needed   . tamsulosin (FLOMAX) 0.4 MG CAPS capsule Take 1 capsule (0.4 mg total) by mouth daily.   . trospium (SANCTURA) 20 MG tablet Take 20 mg by mouth 2 (two) times daily.   . vitamin B-12 (CYANOCOBALAMIN) 1000 MCG tablet Take 1,000 mcg by mouth daily.   . Fluticasone-Umeclidin-Vilant (TRELEGY ELLIPTA) 100-62.5-25 MCG/INH AEPB Inhale 1 puff into the lungs daily. (Patient not taking: Reported on 05/14/2020) 05/14/2020: Reports not taking education provided to take daily.   No facility-administered  encounter medications on file as of 05/14/2020.    Functional Status:  In your present state of health, do you have any difficulty performing the following activities: 08/08/2019 06/15/2019  Hearing? N N  Vision? N N  Difficulty concentrating or making decisions? Y N  Walking or climbing stairs? N N  Dressing or bathing? N N  Doing errands, shopping? N N  Preparing Food and eating ? N N  Using the Toilet? N N  In the past six months, have you accidently leaked urine? N Y  Comment - Hx. of prostate cancer followed by Dr. Ivery Quale  Do  you have problems with loss of bowel control? N N  Managing your Medications? N N  Managing your Finances? N N  Housekeeping or managing your Housekeeping? N N  Some recent data might be hidden    Fall/Depression Screening: Fall Risk  01/17/2020 01/17/2020 11/28/2019  Falls in the past year? 1 1 0  Number falls in past yr: 0 0 -  Injury with Fall? 0 0 -  Risk for fall due to : History of fall(s) - -  Follow up Falls evaluation completed;Education provided;Falls prevention discussed Falls prevention discussed;Education provided;Falls evaluation completed -   PHQ 2/9 Scores 11/28/2019 08/08/2019 06/15/2019 05/10/2019 05/10/2019 08/02/2018 11/17/2017  PHQ - 2 Score 0 0 0 0 0 0 0  PHQ- 9 Score - - - - - - -    Assessment:  Goals Addressed            This Visit's Progress   . Learn More About My Health       Timeframe:  Long-Range Goal Priority:  High Start Date:  01/17/20                           Expected End Date: 01/19/21                      Follow Up Date 07/20/20   - make a list of questions - ask questions - repeat what I heard to make sure I understand - bring a list of my medicines to the visit - speak up when I don't understand  -Encouraged patient to be prepared with a list of questions to ask his providers. - Encouraged patient to call nurse when he does not understand medical conditions, medical orders, and medication  Why is this important?   The best way to learn about your health and care is by talking to the doctor and nurse.  They will answer your questions and give you information in the way that you like best.    Notes: Updated: 01/23/20 Patient states that he will continue to read the diabetes education that this nurse sent him previously. On 01/17/20 Nurse and patient reviewed diabetes education together with the patient reading from a diabetes educational booklet.  Updated 05/14/20: Nurse provided diabetes education and discussed for the patient to call her when  he has questions regarding his health and wellness.     . Make and Keep All Appointments       Timeframe:  Long-Range Goal Priority:  Medium Start Date: 05/14/20                            Expected End Date:  09/19/20                     Follow  Up Date 07/20/20    Encouraged patient to see pulmonologist regarding CPAP which currently is not working.    Why is this important?    Part of staying healthy is seeing the doctor for follow-up care.   If you forget your appointments, there are some things you can do to stay on track.    Notes: 05/14/20: Patient states he is not wearing his C-PAP because he is missing pieces. Nurse will assist patient with contacting pulmonologist to start the process of getting C-PAP device.  Updated 05/15/20: Nurse did a chart review and has various questions regarding if patient went to his sleep study appointment scheduled on 12/16/19 per Dr. Gust Brooms note. Nurse called patient 3 times and left a message and requested a callback [phone] stating she needs further information before she reaches out to pulmonary.     . Manage My Medicine       Timeframe:  Long-Range Goal Priority:  High Start Date:  01/17/20                           Expected End Date:  01/16/21                     Follow Up Date 07/20/20   - call for medicine refill 2 or 3 days before it runs out - keep a list of all the medicines I take; vitamins and herbals too - learn to read medicine labels - use a pillbox to sort medicine   - Encouraged patient to take his Trelegy inhaler daily  Why is this important?   These steps will help you keep on track with your medicines.    Notes: Updated 05/14/20: Patient reports that he has not been taking his Trelegy inhaler. Nurse provided education how this would improve his breathing if taken daily. Nurse will send patient a pillbox to assist him with organizing his medications. The After visit summary  will have a list of the patient's medication.     . Monitor and Manage My Blood Sugar       Timeframe:  Long-Range Goal Priority:  High Start Date:  01/17/20                           Expected End Date:  01/16/21                     Follow Up Date 07/20/20   - check blood sugar at prescribed times - check blood sugar if I feel it is too high or too low - enter blood sugar readings and medication or insulin into daily log - take the blood sugar log to all doctor visits  -Encouraged patient to write his blood sugar values down in a calendar booklet explaining this would make it easier to follow his diabetic control versus going to his meter to view.   Why is this important?   Checking your blood sugar at home helps to keep it from getting very high or very low.  Writing the results in a diary or log helps the doctor know how to care for you.  Your blood sugar log should have the time, date and the results.  Also, write down the amount of insulin or other medicine that you take.  Other information, like what you ate, exercise done and how you were feeling, will also be helpful.  Notes: Patient reports that he will start to write his blood sugar values in a log so he can look quickly to know how he is controlling his diabetes. He is motivated to start to look at food labels and  limit his carbohydrates < 60 grams per meal and <30 grams per snack, he states he wants to start to walk twice weekly for about a half of a mile, patient states he will use the diabetic plate method and will fill half of his plate with vegetables, patient wants to decrease the amount of coffee he drinks  to 1-2 cups daily and increase his water intake to the goal of 3 bottles daily.  Updated: 01/23/20  Updated 05/14/20: Patient states that his blood sugars have not been controlled recently. He verbalized understanding that recording his blood sugar in a calendar booklet would make it easier to follow his blood sugar trends. Patient states that he a has gotten away  from counting his carbohydrates and will start to monitor his carbohydrate intake closer. He reports that he has decrease the amount of coffee he drinks and does drink about 5-6, 8 ounce bottles of water daily. Nurse will send patient a calendar booklet to record his blood sugars and counting carbohydrates education.    . Perform Foot Care       Timeframe:  Long-Range Goal Priority:  Medium Start Date: 01/17/20                           Expected End Date:  01/16/21                     Follow Up Date 07/20/20   - check feet daily for cuts, sores or redness - wash and dry feet carefully every day - wear comfortable, cotton socks - wear comfortable, well-fitting shoes    Why is this important?   Good foot care is very important when you have diabetes.  There are many things you can do to keep your feet healthy and catch a problem early.    Notes: Patient reports he looks at his feet daily for any issues or breakdown.    . Set My Target A1C       Timeframe:  Long-Range Goal Priority:  Medium Start Date: 01/17/20                            Expected End Date: 01/16/21                      Follow Up Date 07/20/20   - set target A1C    Why is this important?   Your target A1C is decided together by you and your doctor.  It is based on several things like your age and other health issues.    Notes: Patient states he wants to keep his A1c below 7.5. Current A1c is 7.1 on 11/28/19      Plan: Allen Hunter will send PCP a quarterly update, will send patient a calendar booklet, pillbox, and diabetes diet education, will call patient within the month of March. Follow-up:  Patient agrees to Care Plan and Follow-up.  Allen Loron RN, BSN [place] [phone] [email or web address]

## 2020-05-14 NOTE — Patient Instructions (Addendum)
Goals Addressed            This Visit's Progress   . Learn More About My Health       Timeframe:  Long-Range Goal Priority:  High Start Date:  01/17/20                           Expected End Date: 01/19/21                      Follow Up Date 07/20/20   - make a list of questions - ask questions - repeat what I heard to make sure I understand - bring a list of my medicines to the visit - speak up when I don't understand  -Encouraged patient to be prepared with a list of questions to ask his providers. - Encouraged patient to call nurse when he does not understand medical conditions, medical orders, and medication  Why is this important?   The best way to learn about your health and care is by talking to the doctor and nurse.  They will answer your questions and give you information in the way that you like best.    Notes: Updated: 01/23/20 Patient states that he will continue to read the diabetes education that this nurse sent him previously. On 01/17/20 Nurse and patient reviewed diabetes education together with the patient reading from a diabetes educational booklet.  Updated 05/14/20: Nurse provided diabetes education and discussed for the patient to call her when he has questions regarding his health and wellness.     . Make and Keep All Appointments       Timeframe:  Long-Range Goal Priority:  Medium Start Date: 05/14/20                            Expected End Date:  09/19/20                     Follow Up Date 07/20/20    Encouraged patient to see pulmonologist regarding CPAP which currently is not working.    Why is this important?    Part of staying healthy is seeing the doctor for follow-up care.   If you forget your appointments, there are some things you can do to stay on track.    Notes: 05/14/20: Patient states he is not wearing his C-PAP because he is missing pieces. Nurse will assist patient with contacting pulmonologist to start the process of getting C-PAP  device.  Updated 05/15/20: Nurse did a chart review and has various questions regarding if patient went to his sleep study appointment scheduled on 12/16/19 per Dr. Gust Brooms note. Nurse called patient 3 times and left a message and requested a callback [phone] stating she needs further information before she reaches out to pulmonary.     . Manage My Medicine       Timeframe:  Long-Range Goal Priority:  High Start Date:  01/17/20                           Expected End Date:  01/16/21                     Follow Up Date 07/20/20   - call for medicine refill 2 or 3 days before it runs out - keep a list of all the medicines I  take; vitamins and herbals too - learn to read medicine labels - use a pillbox to sort medicine   - Encouraged patient to take his Trelegy inhaler daily  Why is this important?   These steps will help you keep on track with your medicines.    Notes: Updated 05/14/20: Patient reports that he has not been taking his Trelegy inhaler. Nurse provided education how this would improve his breathing if taken daily. Nurse will send patient a pillbox to assist him with organizing his medications. The After visit summary  will have a list of the patient's medication.    . Monitor and Manage My Blood Sugar       Timeframe:  Long-Range Goal Priority:  High Start Date:  01/17/20                           Expected End Date:  01/16/21                     Follow Up Date 07/20/20   - check blood sugar at prescribed times - check blood sugar if I feel it is too high or too low - enter blood sugar readings and medication or insulin into daily log - take the blood sugar log to all doctor visits  -Encouraged patient to write his blood sugar values down in a calendar booklet explaining this would make it easier to follow his diabetic control versus going to his meter to view.   Why is this important?   Checking your blood sugar at home helps to keep it from getting very high or very  low.  Writing the results in a diary or log helps the doctor know how to care for you.  Your blood sugar log should have the time, date and the results.  Also, write down the amount of insulin or other medicine that you take.  Other information, like what you ate, exercise done and how you were feeling, will also be helpful.     Notes: Patient reports that he will start to write his blood sugar values in a log so he can look quickly to know how he is controlling his diabetes. He is motivated to start to look at food labels and  limit his carbohydrates < 60 grams per meal and <30 grams per snack, he states he wants to start to walk twice weekly for about a half of a mile, patient states he will use the diabetic plate method and will fill half of his plate with vegetables, patient wants to decrease the amount of coffee he drinks  to 1-2 cups daily and increase his water intake to the goal of 3 bottles daily.  Updated: 01/23/20  Updated 05/14/20: Patient states that his blood sugars have not been controlled recently. He verbalized understanding that recording his blood sugar in a calendar booklet would make it easier to follow his blood sugar trends. Patient states that he a has gotten away from counting his carbohydrates and will start to monitor his carbohydrate intake closer. He reports that he has decrease the amount of coffee he drinks and does drink about 5-6, 8 ounce bottles of water daily. Nurse will send patient a calendar booklet to record his blood sugars and counting carbohydrates education.    . Perform Foot Care       Timeframe:  Long-Range Goal Priority:  Medium Start Date: 01/17/20  Expected End Date:  01/16/21                     Follow Up Date 07/20/20   - check feet daily for cuts, sores or redness - wash and dry feet carefully every day - wear comfortable, cotton socks - wear comfortable, well-fitting shoes    Why is this important?   Good foot care is  very important when you have diabetes.  There are many things you can do to keep your feet healthy and catch a problem early.    Notes: Patient reports he looks at his feet daily for any issues or breakdown.    . Set My Target A1C       Timeframe:  Long-Range Goal Priority:  Medium Start Date: 01/17/20                            Expected End Date: 01/16/21                      Follow Up Date 07/20/20   - set target A1C    Why is this important?   Your target A1C is decided together by you and your doctor.  It is based on several things like your age and other health issues.    Notes: Patient states he wants to keep his A1c below 7.5. Current A1c is 7.1 on 11/28/19

## 2020-05-16 NOTE — Telephone Encounter (Signed)
Ok sounds good but pt should f/u with that provider  Please be aware that this medication can lead to high bp as a side effect and he already has damage to kidneys from htn

## 2020-05-17 ENCOUNTER — Ambulatory Visit: Payer: Self-pay | Admitting: *Deleted

## 2020-05-17 ENCOUNTER — Telehealth: Payer: Self-pay | Admitting: Internal Medicine

## 2020-05-17 MED ORDER — MIRABEGRON ER 25 MG PO TB24: 25.0000 mg | ORAL_TABLET | Freq: Every day | ORAL | 3 refills | Status: DC

## 2020-05-17 NOTE — Telephone Encounter (Signed)
Ok done erx 

## 2020-05-17 NOTE — Telephone Encounter (Signed)
Patient called and said that he did like Myrbetriq. It is not on patients medication list. He said that he got samples. He was wondering if a prescription for it could be sent to [place], [place]

## 2020-05-20 ENCOUNTER — Other Ambulatory Visit: Payer: Self-pay | Admitting: Internal Medicine

## 2020-05-20 DIAGNOSIS — E119 Type 2 diabetes mellitus without complications: Secondary | ICD-10-CM

## 2020-05-20 NOTE — Telephone Encounter (Signed)
Please refill as per office routine med refill policy (all routine meds refilled for 3 mo or monthly per pt preference up to one year from last visit, then month to month grace period for 3 mo, then further med refills will have to be denied)  

## 2020-05-22 ENCOUNTER — Other Ambulatory Visit: Payer: Self-pay

## 2020-05-27 DIAGNOSIS — N1831 Chronic kidney disease, stage 3a: Secondary | ICD-10-CM | POA: Diagnosis not present

## 2020-05-31 ENCOUNTER — Other Ambulatory Visit: Payer: Self-pay | Admitting: Internal Medicine

## 2020-05-31 DIAGNOSIS — J309 Allergic rhinitis, unspecified: Secondary | ICD-10-CM | POA: Diagnosis not present

## 2020-05-31 NOTE — Telephone Encounter (Signed)
Please refill as per office routine med refill policy (all routine meds refilled for 3 mo or monthly per pt preference up to one year from last visit, then month to month grace period for 3 mo, then further med refills will have to be denied)  

## 2020-06-03 DIAGNOSIS — N1831 Chronic kidney disease, stage 3a: Secondary | ICD-10-CM | POA: Diagnosis not present

## 2020-06-03 DIAGNOSIS — N400 Enlarged prostate without lower urinary tract symptoms: Secondary | ICD-10-CM | POA: Diagnosis not present

## 2020-06-03 DIAGNOSIS — E1122 Type 2 diabetes mellitus with diabetic chronic kidney disease: Secondary | ICD-10-CM | POA: Diagnosis not present

## 2020-06-03 DIAGNOSIS — I129 Hypertensive chronic kidney disease with stage 1 through stage 4 chronic kidney disease, or unspecified chronic kidney disease: Secondary | ICD-10-CM | POA: Diagnosis not present

## 2020-06-03 DIAGNOSIS — E875 Hyperkalemia: Secondary | ICD-10-CM | POA: Diagnosis not present

## 2020-06-03 DIAGNOSIS — N2889 Other specified disorders of kidney and ureter: Secondary | ICD-10-CM | POA: Diagnosis not present

## 2020-06-03 NOTE — Telephone Encounter (Signed)
PA cannot be approved. Coverage is provided in situations where the pt tried & failed behavioral interventions to include pelvic floor muscle training in women AND bladder training.

## 2020-06-10 ENCOUNTER — Other Ambulatory Visit: Payer: Self-pay | Admitting: *Deleted

## 2020-06-10 ENCOUNTER — Telehealth: Payer: Self-pay

## 2020-06-10 NOTE — Telephone Encounter (Signed)
Lm for reminder of covid test prior to PFT   06[facility]13/2020 [facility]

## 2020-06-10 NOTE — Patient Outreach (Signed)
[facility]) Care Management  06/10/2020  Allen Hunter. Dec 09, 1940 [id number]  Unsuccessful outreach attempt made to patient. RN Health Coach left HIPAA compliant voicemail message along with her contact information.  Plan: RN Health Coach will call patient within the month of April.  [name] RN, BSN [name] [phone] [name]

## 2020-06-11 ENCOUNTER — Encounter: Payer: Self-pay | Admitting: *Deleted

## 2020-06-11 ENCOUNTER — Telehealth: Payer: Self-pay | Admitting: Pulmonary Disease

## 2020-06-11 ENCOUNTER — Other Ambulatory Visit: Payer: Self-pay | Admitting: *Deleted

## 2020-06-11 DIAGNOSIS — G4733 Obstructive sleep apnea (adult) (pediatric): Secondary | ICD-10-CM

## 2020-06-11 NOTE — Telephone Encounter (Signed)
Patient is aware of date/time of covid test.   

## 2020-06-11 NOTE — Telephone Encounter (Signed)
Lm for Allen Hunter.

## 2020-06-11 NOTE — Telephone Encounter (Signed)
He has not used CPAP since at least August of last year.  Perhaps even prior to that.  Dr. Raul Del had ordered sleep studies in August and then again in September and they do not seem to have been done.  He at this point, will need another sleep study to be able to determine what pressures he needs and if he needs CPAP and/or BiPAP.  He will need an in lab split-night study.  I do not usually follow sleep issues however can order this study and have one of our sleep providers follow his sleep issues.

## 2020-06-11 NOTE — Telephone Encounter (Signed)
Allen Hunter with [facility] is aware of below message and voiced her understanding. Split night has been ordered. I have spoken to patient and he is agreeable with sleep study.  Nothing further needed at this time.

## 2020-06-11 NOTE — Telephone Encounter (Signed)
This encounter was created in error - please disregard.

## 2020-06-11 NOTE — Telephone Encounter (Signed)
Spoke to Allen Hunter with [facility]. [name] is requesting order for cpap machine, as patient's current machine is broken. Machine has been broken for months per [place]. Previous pulmonologist was managing cpap.   Dr. Patsey Berthold, please advise. Thanks

## 2020-06-11 NOTE — Patient Outreach (Addendum)
[facility]) Care Management  [place]  06/11/2020   Allen Hunter Apr 03, 1940 [id number]  Subjective: Successful telephone outreach call to patient. HIPAA identifiers obtained. Patient states that his blood sugars continue to run high in the 130-300 range. He reports receiving the calendar booklet to record his values but admits he has not begun to write his values down. Nurse provided education and the patient states he will begin to record his blood sugar values, start to count his carbohydrates, and limit his sugar intake. Patient explained that he changed his pulmonologist to Dr. Patsey Berthold. He has not discussed with her that his C-PAP machine has been broken. Nurse placed a called to Dr. Domingo Dimes office and Lesleigh Noe called this nurse back and stated that she would inform Dr. Patsey Berthold that patient needs a C-PAP. Nurse discussed with patient that another CM who works with [facility] patients would begin to outreach to him as the patient recently changed his insurance. Patient verbalized understanding and thanked this nurse for her health and wellness assistance. Patient did not have any further questions or concerns today.    Update:  Margie called back from Dr. Domingo Dimes office and states that patient will need a sleep study. Their office will place a referral for a sleep study and they will call Allen Hunter to inform him of the sleep study referral.   Encounter Medications:  Outpatient Encounter Medications as of 06/11/2020  Medication Sig Note  . allopurinol (ZYLOPRIM) 300 MG tablet TAKE 1 TABLET DAILY   . aspirin 81 MG tablet Take 81 mg by mouth daily.   Marland Kitchen atorvastatin (LIPITOR) 80 MG tablet TAKE 1 TABLET DAILY   . bisoprolol (ZEBETA) 5 MG tablet TAKE 1 TABLET DAILY   . cetirizine (ZYRTEC) 10 MG tablet    . Dulaglutide (TRULICITY) 7.12 WP/8.0DX SOPN Inject 0.5 mLs (0.75 mg total) into the skin once a week.   . esomeprazole (NEXIUM) 40 MG capsule TAKE 1 CAPSULE DAILY    . Fluticasone-Umeclidin-Vilant (TRELEGY ELLIPTA) 100-62.5-25 MCG/INH AEPB Inhale 1 puff into the lungs daily. 06/11/2020: Patient place inhaler by his pillbox and states he will take it daily  . FREESTYLE LITE test strip USE 1 STRIP TWICE A DAY AS NEEDED TO CHECK SUGAR   . glipiZIDE (GLUCOTROL XL) 10 MG 24 hr tablet TAKE 1 TABLET DAILY WITH BREAKFAST   . Ipratropium-Albuterol (COMBIVENT RESPIMAT) 20-100 MCG/ACT AERS respimat INHALE 1 PUFF BY ORAL INHALATION FOUR TIMES A DAY FOR BREATHING-DO NOT USE SPACERS WITH THIS MEDICINE   . JARDIANCE 25 MG TABS tablet TAKE 1 TABLET DAILY   . Lancets MISC Use asd 1 per day 250.02   . metFORMIN (GLUCOPHAGE) 1000 MG tablet TAKE 1 TABLET TWICE A DAY WITH A MEAL   . mirabegron ER (MYRBETRIQ) 25 MG TB24 tablet Take 1 tablet (25 mg total) by mouth daily.   . montelukast (SINGULAIR) 10 MG tablet TAKE 1 TABLET (10MG  TOTAL ) BY MOUTH AT BEDTIME   . Multiple Vitamins-Minerals (CENTRUM MEN PO) Take 1 tablet by mouth daily.   . Omega-3 Fatty Acids (FISH OIL) 500 MG CAPS Take by mouth daily.   . sildenafil (VIAGRA) 100 MG tablet Take one tablet by mouth daily as needed   . trospium (SANCTURA) 20 MG tablet Take 20 mg by mouth 2 (two) times daily.   . vitamin B-12 (CYANOCOBALAMIN) 1000 MCG tablet Take 1,000 mcg by mouth daily.   . fluticasone (FLONASE) 50 MCG/ACT nasal spray Place 1 spray into both  nostrils daily. (Patient not taking: Reported on 06/11/2020) 06/11/2020: Patient reports he is not taking  . Lactulose 20 GM/30ML SOLN 30 ml every 8 hrs as needed (Patient not taking: Reported on 06/11/2020) 06/11/2020: Patient states he is not taking  . tamsulosin (FLOMAX) 0.4 MG CAPS capsule Take 1 capsule (0.4 mg total) by mouth daily. (Patient not taking: Reported on 06/11/2020) 06/11/2020: Reports he no longer takes   No facility-administered encounter medications on file as of 06/11/2020.    Functional Status:  In your present state of health, do you have any difficulty  performing the following activities: 08/08/2019 06/15/2019  Hearing? N N  Vision? N N  Difficulty concentrating or making decisions? Y N  Walking or climbing stairs? N N  Dressing or bathing? N N  Doing errands, shopping? N N  Preparing Food and eating ? N N  Using the Toilet? N N  In the past six months, have you accidently leaked urine? N Y  Comment - Hx. of prostate cancer followed by Dr. Ivery Quale  Do you have problems with loss of bowel control? N N  Managing your Medications? N N  Managing your Finances? N N  Housekeeping or managing your Housekeeping? N N  Some recent data might be hidden    Fall/Depression Screening: Fall Risk  06/11/2020 01/17/2020 01/17/2020  Falls in the past year? 1 1 1   Number falls in past yr: 0 0 0  Injury with Fall? 1 0 0  Risk for fall due to : History of fall(s) History of fall(s) -  Follow up Falls prevention discussed;Education provided;Falls evaluation completed Falls evaluation completed;Education provided;Falls prevention discussed Falls prevention discussed;Education provided;Falls evaluation completed   PHQ 2/9 Scores 06/11/2020 11/28/2019 08/08/2019 06/15/2019 05/10/2019 05/10/2019 08/02/2018  PHQ - 2 Score 0 0 0 0 0 0 0  PHQ- 9 Score - - - - - - -    Assessment:  Goals Addressed            This Visit's Progress   . Learn More About My Health       Timeframe:  Long-Range Goal Priority:  High Start Date:  01/17/20                           Expected End Date: 01/19/21                      Follow Up Date 09/19/20   - make a list of questions - ask questions - repeat what I heard to make sure I understand - bring a list of my medicines to the visit - speak up when I don't understand  -Encouraged patient to be prepared with a list of questions to ask his providers. - Encouraged patient to call nurse when he does not understand medical conditions, medical orders, and medication  Why is this important?   The best way to learn about your  health and care is by talking to the doctor and nurse.  They will answer your questions and give you information in the way that you like best.    Notes: Updated: 01/23/20 Patient states that he will continue to read the diabetes education that this nurse sent him previously. On 01/17/20 Nurse and patient reviewed diabetes education together with the patient reading from a diabetes educational booklet.  Updated 05/14/20: Nurse provided diabetes education and discussed for the patient to call her when he has questions regarding his health  and wellness.  Updated 06/11/20: Reviewed calendar booklet with the patient and discussed colored zones and rescue action plans for diabetes, COPD, CHF     . Make and Keep All Appointments       Timeframe:  Long-Range Goal Priority:  Medium Start Date: 05/14/20                            Expected End Date:  09/19/20                     Follow Up Date 09/19/20    Encouraged patient to see pulmonologist regarding CPAP which currently is not working.    Why is this important?    Part of staying healthy is seeing the doctor for follow-up care.   If you forget your appointments, there are some things you can do to stay on track.    Notes: 05/14/20: Patient states he is not wearing his C-PAP because he is missing pieces. Nurse will assist patient with contacting pulmonologist to start the process of getting C-PAP device.  Updated 05/15/20: Nurse did a chart review and has various questions regarding if patient went to his sleep study appointment scheduled on 12/16/19 per Dr. Gust Brooms note. Nurse called patient 3 times and left a message and requested a callback [phone] stating she needs further information before she reaches out to pulmonary.   Updated 06/11/20: Placed a call to Dr. Domingo Dimes office and spoke to [place] who will inform doctor that the patient's C-PAP is broken.    . Manage My Medicine       Timeframe:  Long-Range Goal Priority:  High Start  Date:  01/17/20                           Expected End Date:  01/16/21                     Follow Up Date 09/19/20   - call for medicine refill 2 or 3 days before it runs out - keep a list of all the medicines I take; vitamins and herbals too - learn to read medicine labels - use a pillbox to sort medicine   - Encouraged patient to take his Trelegy inhaler daily -Discussed placing a note on the refrigerator to remind patient to take weekly Trulicity  Why is this important?   These steps will help you keep on track with your medicines.    Notes: Updated 05/14/20: Patient reports that he has not been taking his Trelegy inhaler. Nurse provided education how this would improve his breathing if taken daily. Nurse will send patient a pillbox to assist him with organizing his medications. The After visit summary  will have a list of the patient's medication.  Updated 06/11/20: Patient states he received the pillbox and will begin to use it. Patient reports that he is not taking Trelegy daily and he placed this inhaler with the medications he takes daily as a reminder. Verbalized that he will place a note on his refrigerator to remind him to take his weekly Trulicity.    . Monitor and Manage My Blood Sugar       Timeframe:  Long-Range Goal Priority:  High Start Date:  01/17/20                           Expected  End Date:  01/16/21                     Follow Up Date 09/19/20   - check blood sugar at prescribed times - check blood sugar if I feel it is too high or too low - enter blood sugar readings and medication or insulin into daily log - take the blood sugar log to all doctor visits  -Encouraged patient to write his blood sugar values down in a calendar booklet explaining this would make it easier to follow his diabetic control versus going to his meter to view. -Discussed counting his carbohydrates by reading food labels.    Why is this important?   Checking your blood sugar at home helps  to keep it from getting very high or very low.  Writing the results in a diary or log helps the doctor know how to care for you.  Your blood sugar log should have the time, date and the results.  Also, write down the amount of insulin or other medicine that you take.  Other information, like what you ate, exercise done and how you were feeling, will also be helpful.     Notes: Patient reports that he will start to write his blood sugar values in a log so he can look quickly to know how he is controlling his diabetes. He is motivated to start to look at food labels and  limit his carbohydrates < 60 grams per meal and <30 grams per snack, he states he wants to start to walk twice weekly for about a half of a mile, patient states he will use the diabetic plate method and will fill half of his plate with vegetables, patient wants to decrease the amount of coffee he drinks  to 1-2 cups daily and increase his water intake to the goal of 3 bottles daily.  Updated: 01/23/20  Updated 05/14/20: Patient states that his blood sugars have not been controlled recently. He verbalized understanding that recording his blood sugar in a calendar booklet would make it easier to follow his blood sugar trends. Patient states that he a has gotten away from counting his carbohydrates and will start to monitor his carbohydrate intake closer. He reports that he has decrease the amount of coffee he drinks and does drink about 5-6, 8 ounce bottles of water daily. Nurse will send patient a calendar booklet to record his blood sugars and counting carbohydrates education.  Updated 06/11/20: Patient confirms that he received the calendar booklet and admits that he has not begun to record his blood sugar values. He states he will begin to monitor his diet closer and count his carbohydrates as well as record his blood sugar values in the calendar booklet.       Plan: RN Health Coach informed patient that [facility] CM would begin to reach  out to him to provide health and wellness assistance.  Follow-up:  Patient agrees to Care Plan and Follow-up.  [name] RN, BSN [place] [phone] [email or web address]

## 2020-06-11 NOTE — Patient Instructions (Signed)
Goals Addressed            This Visit's Progress   . Learn More About My Health       Timeframe:  Long-Range Goal Priority:  High Start Date:  01/17/20                           Expected End Date: 01/19/21                      Follow Up Date 09/19/20   - make a list of questions - ask questions - repeat what I heard to make sure I understand - bring a list of my medicines to the visit - speak up when I don't understand  -Encouraged patient to be prepared with a list of questions to ask his providers. - Encouraged patient to call nurse when he does not understand medical conditions, medical orders, and medication  Why is this important?   The best way to learn about your health and care is by talking to the doctor and nurse.  They will answer your questions and give you information in the way that you like best.    Notes: Updated: 01/23/20 Patient states that he will continue to read the diabetes education that this nurse sent him previously. On 01/17/20 Nurse and patient reviewed diabetes education together with the patient reading from a diabetes educational booklet.  Updated 05/14/20: Nurse provided diabetes education and discussed for the patient to call her when he has questions regarding his health and wellness.  Updated 06/11/20: Reviewed calendar booklet with the patient and discussed colored zones and rescue action plans for diabetes, COPD, CHF     . Make and Keep All Appointments       Timeframe:  Long-Range Goal Priority:  Medium Start Date: 05/14/20                            Expected End Date:  09/19/20                     Follow Up Date 09/19/20    Encouraged patient to see pulmonologist regarding CPAP which currently is not working.    Why is this important?    Part of staying healthy is seeing the doctor for follow-up care.   If you forget your appointments, there are some things you can do to stay on track.    Notes: 05/14/20: Patient states he is not wearing  his C-PAP because he is missing pieces. Nurse will assist patient with contacting pulmonologist to start the process of getting C-PAP device.  Updated 05/15/20: Nurse did a chart review and has various questions regarding if patient went to his sleep study appointment scheduled on 12/16/19 per Dr. Gust Brooms note. Nurse called patient 3 times and left a message and requested a callback [phone] stating she needs further information before she reaches out to pulmonary.   Updated 06/11/20: Placed a call to Dr. Domingo Dimes office and spoke to [place] who will inform doctor that the patient's C-PAP is broken.    . Manage My Medicine       Timeframe:  Long-Range Goal Priority:  High Start Date:  01/17/20                           Expected End Date:  01/16/21  Follow Up Date 09/19/20   - call for medicine refill 2 or 3 days before it runs out - keep a list of all the medicines I take; vitamins and herbals too - learn to read medicine labels - use a pillbox to sort medicine   - Encouraged patient to take his Trelegy inhaler daily -Discussed placing a note on the refrigerator to remind patient to take weekly Trulicity  Why is this important?   These steps will help you keep on track with your medicines.    Notes: Updated 05/14/20: Patient reports that he has not been taking his Trelegy inhaler. Nurse provided education how this would improve his breathing if taken daily. Nurse will send patient a pillbox to assist him with organizing his medications. The After visit summary  will have a list of the patient's medication.  Updated 06/11/20: Patient states he received the pillbox and will begin to use it. Patient reports that he is not taking Trelegy daily and he placed this inhaler with the medications he takes daily as a reminder. Verbalized that he will place a note on his refrigerator to remind him to take his weekly Trulicity.    . Monitor and Manage My Blood Sugar        Timeframe:  Long-Range Goal Priority:  High Start Date:  01/17/20                           Expected End Date:  01/16/21                     Follow Up Date 09/19/20   - check blood sugar at prescribed times - check blood sugar if I feel it is too high or too low - enter blood sugar readings and medication or insulin into daily log - take the blood sugar log to all doctor visits  -Encouraged patient to write his blood sugar values down in a calendar booklet explaining this would make it easier to follow his diabetic control versus going to his meter to view. -Discussed counting his carbohydrates by reading food labels.    Why is this important?   Checking your blood sugar at home helps to keep it from getting very high or very low.  Writing the results in a diary or log helps the doctor know how to care for you.  Your blood sugar log should have the time, date and the results.  Also, write down the amount of insulin or other medicine that you take.  Other information, like what you ate, exercise done and how you were feeling, will also be helpful.     Notes: Patient reports that he will start to write his blood sugar values in a log so he can look quickly to know how he is controlling his diabetes. He is motivated to start to look at food labels and  limit his carbohydrates < 60 grams per meal and <30 grams per snack, he states he wants to start to walk twice weekly for about a half of a mile, patient states he will use the diabetic plate method and will fill half of his plate with vegetables, patient wants to decrease the amount of coffee he drinks  to 1-2 cups daily and increase his water intake to the goal of 3 bottles daily.  Updated: 01/23/20  Updated 05/14/20: Patient states that his blood sugars have not been controlled recently. He verbalized understanding that recording his  blood sugar in a calendar booklet would make it easier to follow his blood sugar trends. Patient states that he a  has gotten away from counting his carbohydrates and will start to monitor his carbohydrate intake closer. He reports that he has decrease the amount of coffee he drinks and does drink about 5-6, 8 ounce bottles of water daily. Nurse will send patient a calendar booklet to record his blood sugars and counting carbohydrates education.  Updated 06/11/20: Patient confirms that he received the calendar booklet and admits that he has not begun to record his blood sugar values. He states he will begin to monitor his diet closer and count his carbohydrates as well as record his blood sugar values in the calendar booklet.

## 2020-06-13 ENCOUNTER — Other Ambulatory Visit
Admission: RE | Admit: 2020-06-13 | Discharge: 2020-06-13 | Disposition: A | Discharge status: Home / Self Care | Payer: Medicare HMO | Source: Ambulatory Visit | Attending: Pulmonary Disease | Admitting: Pulmonary Disease

## 2020-06-13 ENCOUNTER — Other Ambulatory Visit: Payer: Self-pay

## 2020-06-13 DIAGNOSIS — Z20822 Contact with and (suspected) exposure to covid-19: Secondary | ICD-10-CM | POA: Diagnosis not present

## 2020-06-13 DIAGNOSIS — Z01812 Encounter for preprocedural laboratory examination: Secondary | ICD-10-CM | POA: Insufficient documentation

## 2020-06-13 LAB — SARS CORONAVIRUS 2 (TAT 6-24 HRS): SARS Coronavirus 2: NEGATIVE

## 2020-06-14 ENCOUNTER — Ambulatory Visit: Payer: Medicare HMO | Attending: Pulmonary Disease

## 2020-06-14 DIAGNOSIS — R0600 Dyspnea, unspecified: Secondary | ICD-10-CM

## 2020-06-14 DIAGNOSIS — R0609 Other forms of dyspnea: Secondary | ICD-10-CM

## 2020-06-23 ENCOUNTER — Other Ambulatory Visit: Payer: Self-pay | Admitting: Internal Medicine

## 2020-06-23 NOTE — Telephone Encounter (Signed)
Please refill as per office routine med refill policy (all routine meds refilled for 3 mo or monthly per pt preference up to one year from last visit, then month to month grace period for 3 mo, then further med refills will have to be denied)  

## 2020-06-26 ENCOUNTER — Encounter: Payer: Self-pay | Admitting: Pulmonary Disease

## 2020-07-02 ENCOUNTER — Ambulatory Visit: Payer: Self-pay | Admitting: *Deleted

## 2020-07-08 ENCOUNTER — Telehealth: Payer: Self-pay

## 2020-07-08 NOTE — Telephone Encounter (Signed)
Prior Auth request for Myrbetriq received from pharmacy. PA has been submitted to plan and awaiting response.   Key: [id number] Moris

## 2020-07-11 NOTE — Telephone Encounter (Signed)
Prior Josem Kaufmann has been denied due to bladder training interventions not being used.

## 2020-07-17 ENCOUNTER — Ambulatory Visit: Payer: Medicare HMO

## 2020-07-17 ENCOUNTER — Other Ambulatory Visit: Payer: Self-pay

## 2020-07-17 NOTE — Patient Outreach (Addendum)
[facility]) Care Management  07/17/2020  Victorino Fatzinger. 12-Jun-1940 [id number]   Telephone Assessment     Case transferred due to patient switching to [facility]. Outreach attempt to patient. He denies any acute issues or concerns at present. He continues to reside in his home alone and voices he is managing. He has relative that checks on him and take him to appts. Spoke with patient who shares that he is going for sleep study this evening to get new CPAP. Patient unsure of last PCP appt but voices he will call office and make an appt.  Med review completed with patient and some discrepancies found-pt not taking some of his meds but unable to voice why. He reported that while he is out today he will go to pharmacy and take all his meds and have them review them with him and well as get needed refills on meds. Offered [facility] pharmacy referral but patient stated he would prefer for someone to assist him in person and not over the phone. He denies any RN CM needs or concerns at this time.   Medications Reviewed Today    Reviewed by Hayden Pedro, RN (Registered Nurse) on 07/17/20 at [facility] Status: <None>  Medication Order Taking? Sig Documenting Provider Last Dose Status Informant  allopurinol (ZYLOPRIM) 300 MG tablet [id number] Yes TAKE 1 TABLET DAILY [name], MD Taking Active   aspirin 81 MG tablet [id number] Yes Take 81 mg by mouth daily. [provider] Taking Active   atorvastatin (LIPITOR) 80 MG tablet [id number] Yes TAKE 1 TABLET DAILY [name], MD Taking Active   bisoprolol (ZEBETA) 5 MG tablet [id number]  TAKE 1 TABLET DAILY Nahser, Wonda Cheng, MD  Active   cetirizine (ZYRTEC) 10 MG tablet [id number] Yes  [provider] Taking Active   Dulaglutide (TRULICITY) 9.37 JI/9.6VE SOPN [id number] Yes Inject 0.5 mLs (0.75 mg total) into the skin once a week. [name], MD Taking Active   esomeprazole (NEXIUM) 40 MG capsule  [id number] Yes TAKE 1 CAPSULE DAILY [name], MD Taking Active   fluticasone [facility]) 50 MCG/ACT nasal spray [id number]  Place 1 spray into both nostrils daily.  Patient not taking: Reported on 06/11/2020   [provider]  Active Self           Med Note Laretta Alstrom, JILL A   Tue Jun 11, 2020 12:40 PM) Patient reports he is not taking  Fluticasone-Umeclidin-Vilant (TRELEGY ELLIPTA) 100-62.5-25 MCG/INH AEPB [id number]  Inhale 1 puff into the lungs daily. [name], MD  Active            Med Note Laretta Alstrom, JILL A   Tue Jun 11, 2020 12:41 PM) Patient place inhaler by his pillbox and states he will take it daily  FREESTYLE LITE test strip [id number] Yes USE 1 STRIP TWICE A DAY AS NEEDED TO CHECK SUGAR [name], MD Taking Active   glipiZIDE (GLUCOTROL XL) 10 MG 24 hr tablet [id number] Yes TAKE 1 TABLET DAILY WITH BREAKFAST [name], MD Taking Active   Ipratropium-Albuterol (COMBIVENT RESPIMAT) 20-100 MCG/ACT AERS respimat [id number] Yes INHALE 1 PUFF BY ORAL INHALATION FOUR TIMES A DAY FOR BREATHING-DO NOT USE SPACERS WITH THIS MEDICINE [provider] Taking Active   JARDIANCE 25 MG TABS tablet [id number] Yes TAKE 1 TABLET DAILY [name], MD Taking Active   Lactulose 20 GM/30ML SOLN [id number]  30 ml every 8 hrs as needed  Patient not taking: Reported on 06/11/2020   Corwin Levins, MD  Active            Med Note Hoyt Koch, JILL A   Tue Jun 11, 2020 12:39 PM) Patient states he is not taking  Lancets MISC [id number]  Use asd 1 per day 250.02 Corwin Levins, MD  Active   metFORMIN (GLUCOPHAGE) 1000 MG tablet [id number] Yes TAKE 1 TABLET TWICE A DAY WITH A MEAL Corwin Levins, MD Taking Active   mirabegron ER (MYRBETRIQ) 25 MG TB24 tablet [id number] Yes Take 1 tablet (25 mg total) by mouth daily. Corwin Levins, MD Taking Active   montelukast (SINGULAIR) 10 MG tablet [id number] Yes TAKE 1 TABLET (10MG  TOTAL ) BY MOUTH AT BEDTIME , MD Taking Active   Multiple Vitamins-Minerals  (CENTRUM MEN PO) Corwin Levins Yes Take 1 tablet by mouth daily. [provider] Taking Active Self  Omega-3 Fatty Acids (FISH OIL) 500 MG CAPS [id number] Yes Take by mouth daily. [provider] Taking Active   sildenafil (VIAGRA) 100 MG tablet [id number] No Take one tablet by mouth daily as needed  Patient not taking: Reported on 07/17/2020   Nahser, 07/19/2020, MD Not Taking Active   tamsulosin [facility]) 0.4 MG CAPS capsule [facility]  Take 1 capsule (0.4 mg total) by mouth daily.  Patient not taking: Reported on 06/11/2020   06/13/2020, MD  Active            Med Note Corwin Levins, JILL A   Tue Jun 11, 2020 12:52 PM) Reports he no longer takes  trospium (SANCTURA) 20 MG tablet Jun 13, 2020  Take 20 mg by mouth 2 (two) times daily. [provider]  Active   vitamin B-12 (CYANOCOBALAMIN) 1000 MCG tablet [id number] Yes Take 1,000 mcg by mouth daily. [provider] Taking Active Self            Goals Addressed              This Visit's Progress   .  (THN)Make and Keep All Appointments (pt-stated)        Timeframe:  Long-Range Goal Priority:  Medium Start Date: 05/14/20                            Expected End Date:  09/19/20                     Follow Up Date 09/19/20    Encouraged patient to see pulmonologist regarding CPAP which currently is not working.    Why is this important?    Part of staying healthy is seeing the doctor for follow-up care.   If you forget your appointments, there are some things you can do to stay on track.    Notes: 05/14/20: Patient states he is not wearing his C-PAP because he is missing pieces. Nurse will assist patient with contacting pulmonologist to start the process of getting C-PAP device.  Updated 05/15/20: Nurse did a chart review and has various questions regarding if patient went to his sleep study appointment scheduled on 12/16/19 per Dr. 12/18/19 note. Nurse called patient 3 times and left a message and requested a callback  [phone] stating she needs further information before she reaches out to pulmonary.   Updated 06/11/20: Placed a call to Dr. 06/13/20 office and spoke to [place] who will inform doctor that the patient's C-PAP is broken.  07/17/20-Patient is going  today for sleep study test. He has not seen PCP in over 6 months-will call and make and appt ASAP.    Marland Kitchen  [facility] My Medicine (pt-stated)        Timeframe:  Long-Range Goal Priority:  High Start Date:  01/17/20                           Expected End Date:  01/16/21                     Follow Up Date 09/19/20   - call for medicine refill 2 or 3 days before it runs out - keep a list of all the medicines I take; vitamins and herbals too - learn to read medicine labels - use a pillbox to sort medicine   - Encouraged patient to take his Trelegy inhaler daily -Discussed placing a note on the refrigerator to remind patient to take weekly Trulicity  Why is this important?   These steps will help you keep on track with your medicines.    Notes: Updated 05/14/20: Patient reports that he has not been taking his Trelegy inhaler. Nurse provided education how this would improve his breathing if taken daily. Nurse will send patient a pillbox to assist him with organizing his medications. The After visit summary  will have a list of the patient's medication.  Updated 06/11/20: Patient states he received the pillbox and will begin to use it. Patient reports that he is not taking Trelegy daily and he placed this inhaler with the medications he takes daily as a reminder. Verbalized that he will place a note on his refrigerator to remind him to take his weekly Trulicity.  07/17/20-Patient has not been taking Trulicityf or the past 2-3 wks per his report. He is unable to give clear explanation as to why but just states he stopped taking it on his own. Reinforced importance of medication given that his cbgs have bene running in the 200-300s. Blood sugar during  this call 357-fasting.     .  (THN)Monitor and Manage My Blood Sugar (pt-stated)        Timeframe:  Long-Range Goal Priority:  High Start Date:  01/17/20                           Expected End Date:  01/16/21                     Follow Up Date 09/19/20   - check blood sugar at prescribed times - check blood sugar if I feel it is too high or too low - enter blood sugar readings and medication or insulin into daily log - take the blood sugar log to all doctor visits  -Encouraged patient to write his blood sugar values down in a calendar booklet explaining this would make it easier to follow his diabetic control versus going to his meter to view. -Discussed counting his carbohydrates by reading food labels.    Why is this important?   Checking your blood sugar at home helps to keep it from getting very high or very low.  Writing the results in a diary or log helps the doctor know how to care for you.  Your blood sugar log should have the time, date and the results.  Also, write down the amount of insulin or other medicine that you take.  Other information, like  what you ate, exercise done and how you were feeling, will also be helpful.     Notes: Patient reports that he will start to write his blood sugar values in a log so he can look quickly to know how he is controlling his diabetes. He is motivated to start to look at food labels and  limit his carbohydrates < 60 grams per meal and <30 grams per snack, he states he wants to start to walk twice weekly for about a half of a mile, patient states he will use the diabetic plate method and will fill half of his plate with vegetables, patient wants to decrease the amount of coffee he drinks  to 1-2 cups daily and increase his water intake to the goal of 3 bottles daily.  Updated: 01/23/20  Updated 05/14/20: Patient states that his blood sugars have not been controlled recently. He verbalized understanding that recording his blood sugar in a  calendar booklet would make it easier to follow his blood sugar trends. Patient states that he a has gotten away from counting his carbohydrates and will start to monitor his carbohydrate intake closer. He reports that he has decrease the amount of coffee he drinks and does drink about 5-6, 8 ounce bottles of water daily. Nurse will send patient a calendar booklet to record his blood sugars and counting carbohydrates education.  Updated 06/11/20: Patient confirms that he received the calendar booklet and admits that he has not begun to record his blood sugar values. He states he will begin to monitor his diet closer and count his carbohydrates as well as record his blood sugar values in the calendar booklet.   07/17/20-Patient reports checking cbgs 2-3x/day. He checked cbg during this call. He admits to eating pasta yesterday and thinks that is the cause of elevated cbg this morning.     .  COMPLETED: Learn More About My Health        Timeframe:  Long-Range Goal Priority:  High Start Date:  01/17/20                           Expected End Date: 01/19/21                      Follow Up Date 09/19/20   - make a list of questions - ask questions - repeat what I heard to make sure I understand - bring a list of my medicines to the visit - speak up when I don't understand  -Encouraged patient to be prepared with a list of questions to ask his providers. - Encouraged patient to call nurse when he does not understand medical conditions, medical orders, and medication  Why is this important?   The best way to learn about your health and care is by talking to the doctor and nurse.  They will answer your questions and give you information in the way that you like best.    Notes: Updated: 01/23/20 Patient states that he will continue to read the diabetes education that this nurse sent him previously. On 01/17/20 Nurse and patient reviewed diabetes education together with the patient reading from a diabetes  educational booklet.  Updated 05/14/20: Nurse provided diabetes education and discussed for the patient to call her when he has questions regarding his health and wellness.  Updated 06/11/20: Reviewed calendar booklet with the patient and discussed colored zones and rescue action plans for diabetes, COPD, CHF     .  COMPLETED: Perform Foot Care        Timeframe:  Long-Range Goal Priority:  Medium Start Date: 01/17/20                           Expected End Date:  01/16/21                     Follow Up Date 07/20/20   - check feet daily for cuts, sores or redness - wash and dry feet carefully every day - wear comfortable, cotton socks - wear comfortable, well-fitting shoes    Why is this important?   Good foot care is very important when you have diabetes.  There are many things you can do to keep your feet healthy and catch a problem early.    Notes: Patient reports he looks at his feet daily for any issues or breakdown.    .  COMPLETED: Set My Target A1C        Timeframe:  Long-Range Goal Priority:  Medium Start Date: 01/17/20                            Expected End Date: 01/16/21                      Follow Up Date 07/20/20   - set target A1C    Why is this important?   Your target A1C is decided together by you and your doctor.  It is based on several things like your age and other health issues.    Notes: Patient states he wants to keep his A1c below 7.5. Current A1c is 7.1 on 11/28/19  07/17/20-A1C 7.1         Plan: RN CM discussed with patient next outreach within the month of June. Patient gave verbal consent and in agreement with RN CM follow up and timeframe. Patient aware that they may contact RN CM sooner for any issues or concerns. RN CM reviewed goals and plan of care with patient. Patient agrees to care plan and follow up. RN CM will send welcome letter to patient. RN CM will send quarterly update to PCP.  Enzo Montgomery, RN,BSN,CCM [facility]  Management Telephonic Care Management Coordinator Direct Phone: [phone] Toll Free: [phone] Fax: [phone]

## 2020-07-20 ENCOUNTER — Emergency Department: Payer: Medicare HMO

## 2020-07-20 ENCOUNTER — Inpatient Hospital Stay
Admission: EM | Admit: 2020-07-20 | Discharge: 2020-07-29 | DRG: 637 | Disposition: A | Discharge status: Home Health | Payer: Medicare HMO | Attending: Internal Medicine | Admitting: Internal Medicine

## 2020-07-20 ENCOUNTER — Other Ambulatory Visit: Payer: Self-pay

## 2020-07-20 DIAGNOSIS — N183 Chronic kidney disease, stage 3 unspecified: Secondary | ICD-10-CM | POA: Diagnosis not present

## 2020-07-20 DIAGNOSIS — M109 Gout, unspecified: Secondary | ICD-10-CM | POA: Diagnosis present

## 2020-07-20 DIAGNOSIS — Z7982 Long term (current) use of aspirin: Secondary | ICD-10-CM | POA: Diagnosis not present

## 2020-07-20 DIAGNOSIS — E871 Hypo-osmolality and hyponatremia: Secondary | ICD-10-CM | POA: Diagnosis not present

## 2020-07-20 DIAGNOSIS — Z20822 Contact with and (suspected) exposure to covid-19: Secondary | ICD-10-CM | POA: Diagnosis present

## 2020-07-20 DIAGNOSIS — N179 Acute kidney failure, unspecified: Secondary | ICD-10-CM | POA: Diagnosis not present

## 2020-07-20 DIAGNOSIS — Z9111 Patient's noncompliance with dietary regimen: Secondary | ICD-10-CM

## 2020-07-20 DIAGNOSIS — Z79899 Other long term (current) drug therapy: Secondary | ICD-10-CM | POA: Diagnosis not present

## 2020-07-20 DIAGNOSIS — G9341 Metabolic encephalopathy: Secondary | ICD-10-CM | POA: Diagnosis present

## 2020-07-20 DIAGNOSIS — R4182 Altered mental status, unspecified: Secondary | ICD-10-CM

## 2020-07-20 DIAGNOSIS — K219 Gastro-esophageal reflux disease without esophagitis: Secondary | ICD-10-CM | POA: Diagnosis present

## 2020-07-20 DIAGNOSIS — I129 Hypertensive chronic kidney disease with stage 1 through stage 4 chronic kidney disease, or unspecified chronic kidney disease: Secondary | ICD-10-CM | POA: Diagnosis present

## 2020-07-20 DIAGNOSIS — G4733 Obstructive sleep apnea (adult) (pediatric): Secondary | ICD-10-CM | POA: Diagnosis present

## 2020-07-20 DIAGNOSIS — N400 Enlarged prostate without lower urinary tract symptoms: Secondary | ICD-10-CM | POA: Diagnosis present

## 2020-07-20 DIAGNOSIS — E119 Type 2 diabetes mellitus without complications: Secondary | ICD-10-CM

## 2020-07-20 DIAGNOSIS — E1110 Type 2 diabetes mellitus with ketoacidosis without coma: Principal | ICD-10-CM

## 2020-07-20 DIAGNOSIS — R404 Transient alteration of awareness: Secondary | ICD-10-CM | POA: Diagnosis not present

## 2020-07-20 DIAGNOSIS — Z8546 Personal history of malignant neoplasm of prostate: Secondary | ICD-10-CM

## 2020-07-20 DIAGNOSIS — Z7984 Long term (current) use of oral hypoglycemic drugs: Secondary | ICD-10-CM

## 2020-07-20 DIAGNOSIS — L723 Sebaceous cyst: Secondary | ICD-10-CM | POA: Diagnosis present

## 2020-07-20 DIAGNOSIS — R000 Tachycardia, unspecified: Secondary | ICD-10-CM | POA: Diagnosis not present

## 2020-07-20 DIAGNOSIS — E785 Hyperlipidemia, unspecified: Secondary | ICD-10-CM | POA: Diagnosis not present

## 2020-07-20 DIAGNOSIS — E1165 Type 2 diabetes mellitus with hyperglycemia: Secondary | ICD-10-CM | POA: Diagnosis not present

## 2020-07-20 DIAGNOSIS — Z87891 Personal history of nicotine dependence: Secondary | ICD-10-CM

## 2020-07-20 DIAGNOSIS — E1122 Type 2 diabetes mellitus with diabetic chronic kidney disease: Secondary | ICD-10-CM | POA: Diagnosis present

## 2020-07-20 DIAGNOSIS — R410 Disorientation, unspecified: Secondary | ICD-10-CM | POA: Diagnosis not present

## 2020-07-20 DIAGNOSIS — R0902 Hypoxemia: Secondary | ICD-10-CM | POA: Diagnosis not present

## 2020-07-20 DIAGNOSIS — E860 Dehydration: Secondary | ICD-10-CM

## 2020-07-20 DIAGNOSIS — R21 Rash and other nonspecific skin eruption: Secondary | ICD-10-CM | POA: Diagnosis present

## 2020-07-20 DIAGNOSIS — J449 Chronic obstructive pulmonary disease, unspecified: Secondary | ICD-10-CM | POA: Diagnosis present

## 2020-07-20 LAB — CBC WITH DIFFERENTIAL/PLATELET
Abs Immature Granulocytes: 0.01 10*3/uL (ref 0.00–0.07)
Basophils Absolute: 0 10*3/uL (ref 0.0–0.1)
Basophils Relative: 1 %
Eosinophils Absolute: 0 10*3/uL (ref 0.0–0.5)
Eosinophils Relative: 1 %
HCT: 49.4 % (ref 39.0–52.0)
Hemoglobin: 15.6 g/dL (ref 13.0–17.0)
Immature Granulocytes: 0 %
Lymphocytes Relative: 25 %
Lymphs Abs: 0.9 10*3/uL (ref 0.7–4.0)
MCH: 29.9 pg (ref 26.0–34.0)
MCHC: 31.6 g/dL (ref 30.0–36.0)
MCV: 94.8 fL (ref 80.0–100.0)
Monocytes Absolute: 0.3 10*3/uL (ref 0.1–1.0)
Monocytes Relative: 9 %
Neutro Abs: 2.2 10*3/uL (ref 1.7–7.7)
Neutrophils Relative %: 64 %
Platelets: 191 10*3/uL (ref 150–400)
RBC: 5.21 MIL/uL (ref 4.22–5.81)
RDW: 15.1 % (ref 11.5–15.5)
WBC: 3.4 10*3/uL — ABNORMAL LOW (ref 4.0–10.5)
nRBC: 0 % (ref 0.0–0.2)

## 2020-07-20 LAB — URINALYSIS, COMPLETE (UACMP) WITH MICROSCOPIC
Bilirubin Urine: NEGATIVE
Glucose, UA: >=500 mg/dL — AB
Ketones, ur: 80 mg/dL — AB
Leukocytes,Ua: NEGATIVE
Nitrite: NEGATIVE
Protein, ur: NEGATIVE mg/dL
Specific Gravity, Urine: 1.022 (ref 1.005–1.030)
WBC, UA: NONE SEEN WBC/hpf (ref 0–5)
pH: 5 (ref 5.0–8.0)

## 2020-07-20 LAB — COMPREHENSIVE METABOLIC PANEL
ALT: 12 U/L (ref 0–44)
AST: 19 U/L (ref 15–41)
Albumin: 3.9 g/dL (ref 3.5–5.0)
Alkaline Phosphatase: 51 U/L (ref 38–126)
Anion gap: 23 — ABNORMAL HIGH (ref 5–15)
BUN: 42 mg/dL — ABNORMAL HIGH (ref 8–23)
CO2: 11 mmol/L — ABNORMAL LOW (ref 22–32)
Calcium: 9.2 mg/dL (ref 8.9–10.3)
Chloride: 99 mmol/L (ref 98–111)
Creatinine, Ser: 1.77 mg/dL — ABNORMAL HIGH (ref 0.61–1.24)
GFR, Estimated: 38 mL/min — ABNORMAL LOW (ref >60)
Glucose, Bld: 302 mg/dL — ABNORMAL HIGH (ref 70–99)
Potassium: 4.9 mmol/L (ref 3.5–5.1)
Sodium: 133 mmol/L — ABNORMAL LOW (ref 135–145)
Total Bilirubin: 2.5 mg/dL — ABNORMAL HIGH (ref 0.3–1.2)
Total Protein: 6.8 g/dL (ref 6.5–8.1)

## 2020-07-20 LAB — URINE DRUG SCREEN, QUALITATIVE (ARMC ONLY)
Amphetamines, Ur Screen: NOT DETECTED
Barbiturates, Ur Screen: NOT DETECTED
Benzodiazepine, Ur Scrn: NOT DETECTED
Cannabinoid 50 Ng, Ur ~~LOC~~: NOT DETECTED
Cocaine Metabolite,Ur ~~LOC~~: NOT DETECTED
MDMA (Ecstasy)Ur Screen: NOT DETECTED
Methadone Scn, Ur: NOT DETECTED
Opiate, Ur Screen: NOT DETECTED
Phencyclidine (PCP) Ur S: NOT DETECTED
Tricyclic, Ur Screen: NOT DETECTED

## 2020-07-20 LAB — CBG MONITORING, ED
Glucose-Capillary: 123 mg/dL — ABNORMAL HIGH (ref 70–99)
Glucose-Capillary: 151 mg/dL — ABNORMAL HIGH (ref 70–99)
Glucose-Capillary: 155 mg/dL — ABNORMAL HIGH (ref 70–99)
Glucose-Capillary: 158 mg/dL — ABNORMAL HIGH (ref 70–99)
Glucose-Capillary: 206 mg/dL — ABNORMAL HIGH (ref 70–99)
Glucose-Capillary: 237 mg/dL — ABNORMAL HIGH (ref 70–99)
Glucose-Capillary: 268 mg/dL — ABNORMAL HIGH (ref 70–99)
Glucose-Capillary: 285 mg/dL — ABNORMAL HIGH (ref 70–99)

## 2020-07-20 LAB — HEMOGLOBIN A1C
Hgb A1c MFr Bld: 13.4 % — ABNORMAL HIGH (ref 4.8–5.6)
Mean Plasma Glucose: 337.88 mg/dL

## 2020-07-20 LAB — AMMONIA: Ammonia: 17 umol/L (ref 9–35)

## 2020-07-20 LAB — BASIC METABOLIC PANEL
Anion gap: 13 (ref 5–15)
Anion gap: 10 (ref 5–15)
BUN: 41 mg/dL — ABNORMAL HIGH (ref 8–23)
BUN: 39 mg/dL — ABNORMAL HIGH (ref 8–23)
CO2: 19 mmol/L — ABNORMAL LOW (ref 22–32)
CO2: 20 mmol/L — ABNORMAL LOW (ref 22–32)
Calcium: 9.2 mg/dL (ref 8.9–10.3)
Calcium: 8.8 mg/dL — ABNORMAL LOW (ref 8.9–10.3)
Chloride: 105 mmol/L (ref 98–111)
Chloride: 105 mmol/L (ref 98–111)
Creatinine, Ser: 1.52 mg/dL — ABNORMAL HIGH (ref 0.61–1.24)
Creatinine, Ser: 1.22 mg/dL (ref 0.61–1.24)
GFR, Estimated: 46 mL/min — ABNORMAL LOW (ref >60)
GFR, Estimated: 60 mL/min — ABNORMAL LOW (ref >60)
Glucose, Bld: 113 mg/dL — ABNORMAL HIGH (ref 70–99)
Glucose, Bld: 252 mg/dL — ABNORMAL HIGH (ref 70–99)
Potassium: 4.5 mmol/L (ref 3.5–5.1)
Potassium: 4.1 mmol/L (ref 3.5–5.1)
Sodium: 137 mmol/L (ref 135–145)
Sodium: 135 mmol/L (ref 135–145)

## 2020-07-20 LAB — ETHANOL: Alcohol, Ethyl (B): <10 mg/dL (ref <10)

## 2020-07-20 LAB — RESP PANEL BY RT-PCR (FLU A&B, COVID) ARPGX2
Influenza A by PCR: NEGATIVE
Influenza B by PCR: NEGATIVE
SARS Coronavirus 2 by RT PCR: NEGATIVE

## 2020-07-20 LAB — TROPONIN I (HIGH SENSITIVITY)
Troponin I (High Sensitivity): 14 ng/L (ref <18)
Troponin I (High Sensitivity): 16 ng/L (ref <18)

## 2020-07-20 LAB — BETA-HYDROXYBUTYRIC ACID
Beta-Hydroxybutyric Acid: 5.29 mmol/L — ABNORMAL HIGH (ref 0.05–0.27)
Beta-Hydroxybutyric Acid: 2.44 mmol/L — ABNORMAL HIGH (ref 0.05–0.27)

## 2020-07-20 LAB — MAGNESIUM: Magnesium: 2.1 mg/dL (ref 1.7–2.4)

## 2020-07-20 MED ORDER — MIRABEGRON ER 25 MG PO TB24
25.0000 mg | ORAL_TABLET | Freq: Every day | ORAL | Status: DC
  Administered 2020-07-20 – 2020-07-29 (×10): 25 mg via ORAL
  Filled 2020-07-20 (×10): qty 1

## 2020-07-20 MED ORDER — INSULIN ASPART 100 UNIT/ML IJ SOLN
0.0000 [IU] | Freq: Three times a day (TID) | INTRAMUSCULAR | Status: DC
  Administered 2020-07-21: 09:00:00 2 [IU] via SUBCUTANEOUS
  Filled 2020-07-20: qty 1

## 2020-07-20 MED ORDER — ADULT MULTIVITAMIN W/MINERALS CH
1.0000 | ORAL_TABLET | Freq: Every day | ORAL | Status: DC
  Administered 2020-07-21 – 2020-07-29 (×9): 1 via ORAL
  Filled 2020-07-20 (×9): qty 1

## 2020-07-20 MED ORDER — IPRATROPIUM-ALBUTEROL 20-100 MCG/ACT IN AERS
1.0000 | INHALATION_SPRAY | Freq: Four times a day (QID) | RESPIRATORY_TRACT | Status: DC
  Administered 2020-07-21 – 2020-07-29 (×31): 1 via RESPIRATORY_TRACT
  Filled 2020-07-20 (×2): qty 4

## 2020-07-20 MED ORDER — VITAMIN B-12 1000 MCG PO TABS
1000.0000 ug | ORAL_TABLET | Freq: Every day | ORAL | Status: DC
  Administered 2020-07-21 – 2020-07-29 (×9): 1000 ug via ORAL
  Filled 2020-07-20 (×9): qty 1

## 2020-07-20 MED ORDER — LACTATED RINGERS IV SOLN: INTRAVENOUS | Status: DC

## 2020-07-20 MED ORDER — DEXTROSE IN LACTATED RINGERS 5 % IV SOLN: INTRAVENOUS | Status: DC

## 2020-07-20 MED ORDER — MONTELUKAST SODIUM 10 MG PO TABS
10.0000 mg | ORAL_TABLET | Freq: Every day | ORAL | Status: DC
  Administered 2020-07-21 – 2020-07-28 (×9): 10 mg via ORAL
  Filled 2020-07-20 (×10): qty 1

## 2020-07-20 MED ORDER — POTASSIUM CHLORIDE 10 MEQ/100ML IV SOLN
10.0000 meq | INTRAVENOUS | Status: AC
  Administered 2020-07-20: 10 meq via INTRAVENOUS
  Filled 2020-07-20: qty 100

## 2020-07-20 MED ORDER — FLUTICASONE FUROATE-VILANTEROL 100-25 MCG/INH IN AEPB
1.0000 | INHALATION_SPRAY | Freq: Every day | RESPIRATORY_TRACT | Status: DC
  Administered 2020-07-21 – 2020-07-29 (×9): 1 via RESPIRATORY_TRACT
  Filled 2020-07-20: qty 28

## 2020-07-20 MED ORDER — INSULIN REGULAR(HUMAN) IN NACL 100-0.9 UT/100ML-% IV SOLN: INTRAVENOUS | Status: DC

## 2020-07-20 MED ORDER — LACTATED RINGERS IV BOLUS: 20.0000 mL/kg | Freq: Once | INTRAVENOUS | Status: DC

## 2020-07-20 MED ORDER — FLUTICASONE-UMECLIDIN-VILANT 100-62.5-25 MCG/INH IN AEPB: 1.0000 | INHALATION_SPRAY | Freq: Every day | RESPIRATORY_TRACT | Status: DC

## 2020-07-20 MED ORDER — DEXTROSE 50 % IV SOLN: 0.0000 mL | INTRAVENOUS | Status: DC | PRN

## 2020-07-20 MED ORDER — INSULIN REGULAR(HUMAN) IN NACL 100-0.9 UT/100ML-% IV SOLN
INTRAVENOUS | Status: DC
  Administered 2020-07-20: 10 [IU]/h via INTRAVENOUS
  Filled 2020-07-20: qty 100

## 2020-07-20 MED ORDER — ENOXAPARIN SODIUM 40 MG/0.4ML IJ SOSY
40.0000 mg | PREFILLED_SYRINGE | INTRAMUSCULAR | Status: DC
  Administered 2020-07-21 – 2020-07-28 (×9): 40 mg via SUBCUTANEOUS
  Filled 2020-07-20 (×9): qty 0.4

## 2020-07-20 MED ORDER — ALLOPURINOL 100 MG PO TABS
300.0000 mg | ORAL_TABLET | Freq: Every day | ORAL | Status: DC
  Administered 2020-07-20 – 2020-07-29 (×10): 300 mg via ORAL
  Filled 2020-07-20 (×3): qty 3
  Filled 2020-07-20 (×2): qty 1
  Filled 2020-07-20 (×5): qty 3

## 2020-07-20 MED ORDER — DEXTROSE 50 % IV SOLN
0.0000 mL | INTRAVENOUS | Status: DC | PRN
Start: 2020-07-20 — End: 2020-07-29

## 2020-07-20 MED ORDER — ATORVASTATIN CALCIUM 20 MG PO TABS
80.0000 mg | ORAL_TABLET | Freq: Every day | ORAL | Status: DC
  Administered 2020-07-21 – 2020-07-29 (×10): 80 mg via ORAL
  Filled 2020-07-20 (×10): qty 4

## 2020-07-20 MED ORDER — LORATADINE 10 MG PO TABS
10.0000 mg | ORAL_TABLET | Freq: Every day | ORAL | Status: DC
  Administered 2020-07-21 – 2020-07-29 (×9): 10 mg via ORAL
  Filled 2020-07-20 (×9): qty 1

## 2020-07-20 MED ORDER — SODIUM CHLORIDE 0.9 % IV BOLUS
500.0000 mL | Freq: Once | INTRAVENOUS | Status: AC
  Administered 2020-07-20: 500 mL via INTRAVENOUS

## 2020-07-20 MED ORDER — DARIFENACIN HYDROBROMIDE ER 7.5 MG PO TB24
7.5000 mg | ORAL_TABLET | Freq: Every day | ORAL | Status: DC
  Administered 2020-07-20 – 2020-07-29 (×10): 7.5 mg via ORAL
  Filled 2020-07-20 (×10): qty 1

## 2020-07-20 MED ORDER — SODIUM CHLORIDE 0.9 % IV BOLUS: 1000.0000 mL | Freq: Once | INTRAVENOUS | Status: DC

## 2020-07-20 MED ORDER — SODIUM CHLORIDE 0.9 % IV SOLN: Freq: Once | INTRAVENOUS | Status: DC

## 2020-07-20 MED ORDER — POTASSIUM CHLORIDE 10 MEQ/100ML IV SOLN: 10.0000 meq | INTRAVENOUS | Status: AC

## 2020-07-20 MED ORDER — INSULIN ASPART 100 UNIT/ML IJ SOLN: 0.0000 [IU] | Freq: Every day | INTRAMUSCULAR | Status: DC

## 2020-07-20 MED ORDER — BISOPROLOL FUMARATE 5 MG PO TABS
5.0000 mg | ORAL_TABLET | Freq: Every day | ORAL | Status: DC
  Administered 2020-07-20 – 2020-07-29 (×10): 5 mg via ORAL
  Filled 2020-07-20 (×10): qty 1

## 2020-07-20 MED ORDER — OMEGA-3-ACID ETHYL ESTERS 1 G PO CAPS
1.0000 g | ORAL_CAPSULE | Freq: Every day | ORAL | Status: DC
  Administered 2020-07-21 – 2020-07-29 (×9): 1 g via ORAL
  Filled 2020-07-20 (×9): qty 1

## 2020-07-20 MED ORDER — ASPIRIN EC 81 MG PO TBEC
81.0000 mg | DELAYED_RELEASE_TABLET | Freq: Every day | ORAL | Status: DC
  Administered 2020-07-21 – 2020-07-29 (×10): 81 mg via ORAL
  Filled 2020-07-20 (×10): qty 1

## 2020-07-20 MED ORDER — INSULIN DETEMIR 100 UNIT/ML ~~LOC~~ SOLN
0.3000 [IU]/kg | SUBCUTANEOUS | Status: DC
  Administered 2020-07-21 – 2020-07-24 (×5): 22 [IU] via SUBCUTANEOUS
  Filled 2020-07-20 (×6): qty 0.22

## 2020-07-20 MED ORDER — UMECLIDINIUM BROMIDE 62.5 MCG/INH IN AEPB
1.0000 | INHALATION_SPRAY | Freq: Every day | RESPIRATORY_TRACT | Status: DC
  Administered 2020-07-21 – 2020-07-29 (×9): 1 via RESPIRATORY_TRACT
  Filled 2020-07-20: qty 7

## 2020-07-20 NOTE — ED Notes (Signed)
Sent Dr Francine Graven a secure message to inquire about pt level of care as ICU states that pt is not appropriate for their floor- Dr Francine Graven states to reasses for level of care after next [facility]

## 2020-07-20 NOTE — ED Notes (Signed)
Pt cousin at bedside

## 2020-07-20 NOTE — ED Notes (Signed)
Pharmacy states unable to verify medications until med rec done

## 2020-07-20 NOTE — ED Notes (Signed)
Lab at bedside to redraw lt green

## 2020-07-20 NOTE — ED Notes (Signed)
Repeat light green sent to lab

## 2020-07-20 NOTE — ED Provider Notes (Signed)
[facility] Emergency Department Provider Note  ____________________________________________   Event Date/Time   First MD Initiated Contact with Patient 07/20/20 1201     (approximate)  I have reviewed the triage vital signs and the nursing notes.   HISTORY  Chief Complaint Altered Mental Status    HPI Allen Bihl. is a 80 y.o. male with hypertension, hyperlipidemia, prostate cancer who comes in for altered mental status.  Patient significant other is out of town and noted that he seemed more confused.  She wanted him to go to the doctor earlier in the week but he did not go.  He has not been eating or drinking.  Therefore EMS was called due to worsening confusion.  Unable to get full HPI from patient due to altered mental status although he denies any chest pain, shortness of breath, abdominal pain or alcohol or drug use.  Patient was hyperglycemic with EMS          Past Medical History:  Diagnosis Date  . ALLERGIC RHINITIS 10/04/2008  . ASTHMA 10/04/2008  . Asthma   . BACK PAIN 04/18/2008  . BENIGN PROSTATIC HYPERTROPHY 03/09/2007  . COLONIC POLYPS, HX OF 03/09/2007  . COPD 10/20/2007  . DIABETES MELLITUS, TYPE II 03/09/2007  . FATIGUE 10/05/2007  . GERD (gastroesophageal reflux disease) 03/01/2012  . GOUT 10/19/2006  . HYPERLIPIDEMIA 10/19/2006  . HYPERTENSION 10/19/2006  . OSA (obstructive sleep apnea) 07/19/2015  . Prostate cancer ([place]) 01/19/2012  . SWELLING MASS OR LUMP IN HEAD AND NECK 01/09/2010    Patient Active Problem List   Diagnosis Date Noted  . Constipation 05/10/2019  . Chronic diastolic CHF (congestive heart failure) ([place]) 11/15/2018  . DOE (dyspnea on exertion) 05/17/2018  . OSA (obstructive sleep apnea) 07/19/2015  . Asthma with acute exacerbation 04/10/2014  . Unspecified asthma, with exacerbation 09/05/2013  . GERD (gastroesophageal reflux disease) 03/01/2012  . Diarrhea 03/01/2012  . Hypersomnolence 01/19/2012  .  Prostate cancer ([place]) 01/19/2012  . 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 Increased prostate specific antigen (PSA) velocity 01/21/2011  . Erectile dysfunction 01/20/2011  . Bladder neck obstruction 01/20/2011  . Preventative health care 01/18/2011  . Localized swelling, mass and lump, head 01/09/2010  . Allergic rhinitis 10/04/2008  . Asthma 10/04/2008  . BACK PAIN 04/18/2008  . COPD (chronic obstructive pulmonary disease) ([place]) 10/20/2007  . Fatigue 10/05/2007  . Diabetes ([place]) 03/09/2007  . BENIGN PROSTATIC HYPERTROPHY 03/09/2007  . COLONIC POLYPS, HX OF 03/09/2007  . Hyperlipidemia 10/19/2006  . GOUT 10/19/2006  . Essential hypertension 10/19/2006    Past Surgical History:  Procedure Laterality Date  . APPENDECTOMY      Prior to Admission medications   Medication Sig Start Date End Date Taking? Authorizing Provider  allopurinol (ZYLOPRIM) 300 MG tablet TAKE 1 TABLET DAILY 06/24/20   [name], MD  aspirin 81 MG tablet Take 81 mg by mouth daily.    [provider]  atorvastatin (LIPITOR) 80 MG tablet TAKE 1 TABLET DAILY 10/24/19   [name], MD  bisoprolol (ZEBETA) 5 MG tablet TAKE 1 TABLET DAILY 04/22/20   Nahser, Wonda Cheng, MD  cetirizine (ZYRTEC) 10 MG tablet  01/18/17   [provider]  Dulaglutide (TRULICITY) A999333 0000000 SOPN Inject 0.5 mLs (0.75 mg total) into the skin once a week. 09/06/19   [name], MD  esomeprazole (NEXIUM) 40 MG capsule TAKE 1 CAPSULE DAILY 10/23/19   [name], MD  fluticasone [facility]) 50 MCG/ACT  nasal spray Place 1 spray into both nostrils daily. Patient not taking: Reported on 06/11/2020    [provider]  Fluticasone-Umeclidin-Vilant (TRELEGY ELLIPTA) 100-62.5-25 MCG/INH AEPB Inhale 1 puff into the lungs daily. 11/17/17   [name], MD  FREESTYLE LITE test strip USE 1 STRIP TWICE A DAY AS NEEDED TO CHECK SUGAR 08/05/19   [name], MD  glipiZIDE (GLUCOTROL XL) 10 MG 24 hr tablet TAKE 1 TABLET DAILY WITH  BREAKFAST 05/31/20   [name], MD  Ipratropium-Albuterol (COMBIVENT RESPIMAT) 20-100 MCG/ACT AERS respimat INHALE 1 PUFF BY ORAL INHALATION FOUR TIMES A DAY FOR BREATHING-DO NOT USE SPACERS WITH THIS MEDICINE 03/11/20 03/12/21  [provider]  JARDIANCE 25 MG TABS tablet TAKE 1 TABLET DAILY 10/02/19   [name], MD  Lactulose 20 GM/30ML SOLN 30 ml every 8 hrs as needed Patient not taking: Reported on 06/11/2020 05/10/19   [name], MD  Lancets MISC Use asd 1 per day 250.02 10/26/13   [name], MD  metFORMIN (GLUCOPHAGE) 1000 MG tablet TAKE 1 TABLET TWICE A DAY WITH A MEAL 05/21/20   [name], MD  mirabegron ER (MYRBETRIQ) 25 MG TB24 tablet Take 1 tablet (25 mg total) by mouth daily. 05/17/20   [name], MD  montelukast (SINGULAIR) 10 MG tablet TAKE 1 TABLET (10MG  TOTAL ) BY MOUTH AT BEDTIME 01/18/13   [name], MD  Multiple Vitamins-Minerals (CENTRUM MEN PO) Take 1 tablet by mouth daily.    [provider]  Omega-3 Fatty Acids (FISH OIL) 500 MG CAPS Take by mouth daily.    [provider]  sildenafil (VIAGRA) 100 MG tablet Take one tablet by mouth daily as needed Patient not taking: Reported on 07/17/2020 04/18/20   Nahser, Wonda Cheng, MD  tamsulosin (FLOMAX) 0.4 MG CAPS capsule Take 1 capsule (0.4 mg total) by mouth daily. Patient not taking: Reported on 06/11/2020 05/18/17   [name], MD  trospium (SANCTURA) 20 MG tablet Take 20 mg by mouth 2 (two) times daily. 06/08/19   [provider]  vitamin B-12 (CYANOCOBALAMIN) 1000 MCG tablet Take 1,000 mcg by mouth daily.    [provider]    Allergies Patient has no known allergies.  Family History  Problem Relation Age of Onset  . Hypertension Mother   . Cancer Other     Social History Social History   Tobacco Use  . Smoking status: Former Smoker    Packs/day: 1.50    Years: 32.00    Pack years: 48.00    Types: Cigarettes    Quit date: 1987    Years since  quitting: 35.3  . Smokeless tobacco: Never Used  Vaping Use  . Vaping Use: Never used  Substance Use Topics  . Alcohol use: No    Comment: stop 7 years  . Drug use: Never      Review of Systems  Patient denies any symptoms but obviously this is limited due to his confusion Constitutional: No fever/chills Eyes: No visual changes. ENT: No sore throat. Cardiovascular: Denies chest pain. Respiratory: Denies shortness of breath. Gastrointestinal: No abdominal pain.  No nausea, no vomiting.  No diarrhea.  No constipation. Genitourinary: Negative for dysuria. Musculoskeletal: Negative for back pain. Skin: Negative for rash. Neurological: Negative for headaches, focal weakness or numbness. All other ROS negative ____________________________________________   PHYSICAL EXAM:  VITAL SIGNS: ED Triage Vitals  Enc Vitals Group     BP  Pulse      Resp      Temp      Temp src      SpO2      Weight      Height      Head Circumference      Peak Flow      Pain Score      Pain Loc      Pain Edu?      Excl. in [place]?     Constitutional: Alert and oriented x1. Well appearing and in no acute distress. Eyes: Conjunctivae are normal. EOMI. Head: Atraumatic. Nose: No congestion/rhinnorhea. Mouth/Throat: Mucous membranes are moist.   Neck: No stridor. Trachea Midline. FROM Cardiovascular: Tachycardic, regular rhythm. Grossly normal heart sounds.  Good peripheral circulation. Respiratory: Normal respiratory effort.  No retractions. Lungs CTAB. Gastrointestinal: Soft and nontender. No distention. No abdominal bruits.  Musculoskeletal: No lower extremity tenderness nor edema.  No joint effusions. Neurologic:  Normal speech and language. No gross focal neurologic deficits are appreciated.  Skin:  Skin is warm, dry and intact. No rash noted. Psychiatric: Mood and affect are normal. Speech and behavior are normal. GU: Deferred   ____________________________________________    LABS (all labs ordered are listed, but only abnormal results are displayed)  Labs Reviewed  CBC WITH DIFFERENTIAL/PLATELET - Abnormal; Notable for the following components:      Result Value   WBC 3.4 (*)    All other components within normal limits  COMPREHENSIVE METABOLIC PANEL - Abnormal; Notable for the following components:   Sodium 133 (*)    CO2 11 (*)    Glucose, Bld 302 (*)    BUN 42 (*)    Creatinine, Ser 1.77 (*)    Total Bilirubin 2.5 (*)    GFR, Estimated 38 (*)    Anion gap 23 (*)    All other components within normal limits  RESP PANEL BY RT-PCR (FLU A&B, COVID) ARPGX2  MAGNESIUM  ETHANOL  AMMONIA  URINE DRUG SCREEN, QUALITATIVE ([facility] ONLY)  URINALYSIS, COMPLETE (UACMP) WITH MICROSCOPIC  BASIC METABOLIC PANEL  BASIC METABOLIC PANEL  BASIC METABOLIC PANEL  BASIC METABOLIC PANEL  BETA-HYDROXYBUTYRIC ACID  BETA-HYDROXYBUTYRIC ACID  CBG MONITORING, ED  TROPONIN I (HIGH SENSITIVITY)  TROPONIN I (HIGH SENSITIVITY)   ____________________________________________   ED ECG REPORT I, Vanessa [place], the attending physician, personally viewed and interpreted this ECG.  Sinus tachycardia rate of 104, no ST elevation, no T wave version aVL, normal intervals ____________________________________________  RADIOLOGY Robert Bellow, personally viewed and evaluated these images (plain radiographs) as part of my medical decision making, as well as reviewing the written report by the radiologist.  ED MD interpretation: No pneumonia  Official radiology report(s): DG Chest 2 View  Result Date: 07/20/2020 CLINICAL DATA:  Altered mental status EXAM: CHEST - 2 VIEW COMPARISON:  10/20/2007 FINDINGS: The heart size and mediastinal contours are within normal limits. Both lungs are clear. The visualized skeletal structures are unremarkable. IMPRESSION: No active cardiopulmonary disease. Electronically Signed   By: Kathreen Devoid   On: 07/20/2020 13:11   CT Head Wo  Contrast  Result Date: 07/20/2020 CLINICAL DATA:  80 year old male with altered mental status. EXAM: CT HEAD WITHOUT CONTRAST TECHNIQUE: Contiguous axial images were obtained from the base of the skull through the vertex without intravenous contrast. COMPARISON:  None. FINDINGS: Brain: No evidence of acute infarction, hemorrhage, hydrocephalus, extra-axial collection or mass lesion/mass effect. Mild generalized cerebral volume loss and mild probable chronic small-vessel  white matter ischemic changes are noted. Vascular: Carotid atherosclerotic calcifications are noted. Skull: Normal. Negative for fracture or focal lesion. Sinuses/Orbits: No acute finding. Other: A 2.5 cm posterior RIGHT scalp lesion most likely represents a sebaceous cyst. Correlate clinically. IMPRESSION: 1. No evidence of acute intracranial abnormality. 2. Mild generalized cerebral volume loss and mild probable chronic small-vessel white matter ischemic changes. 3. 2.5 cm posterior RIGHT scalp lesion most likely a sebaceous cyst. Correlate clinically. Electronically Signed   By: Margarette [place] M.D.   On: 07/20/2020 13:16    ____________________________________________   PROCEDURES  Procedure(s) performed (including Critical Care):  .1-3 Lead EKG Interpretation Performed by: Vanessa [place], MD Authorized by: Vanessa [place], MD     Interpretation: abnormal     ECG rate:  100s    ECG rate assessment: tachycardic     Rhythm: sinus tachycardia     Ectopy: none     Conduction: normal   .Critical Care Performed by: Vanessa [place], MD Authorized by: Vanessa [place], MD   Critical care provider statement:    Critical care time (minutes):  45   Critical care was necessary to treat or prevent imminent or life-threatening deterioration of the following conditions:  Endocrine crisis   Critical care was time spent personally by me on the following activities:  Discussions with consultants, evaluation of patient's response to treatment,  examination of patient, ordering and performing treatments and interventions, ordering and review of laboratory studies, ordering and review of radiographic studies, pulse oximetry, re-evaluation of patient's condition, obtaining history from patient or surrogate and review of old charts     ____________________________________________   INITIAL IMPRESSION / ASSESSMENT AND PLAN / ED COURSE  Allen Dakin. was evaluated in Emergency Department on 07/20/2020 for the symptoms described in the history of present illness. He was evaluated in the context of the global COVID-19 pandemic, which necessitated consideration that the patient might be at risk for infection with the SARS-CoV-2 virus that causes COVID-19. Institutional protocols and algorithms that pertain to the evaluation of patients at risk for COVID-19 are in a state of rapid change based on information released by regulatory bodies including the CDC and federal and state organizations. These policies and algorithms were followed during the patient's care in the ED.    Patient comes in with altered mental status.  Will get labs to evaluate for [place] abnormalities, DKA, CT head evaluate for intercranial hemorrhage, urine evaluate for UTI, chest x-ray to evaluate for pneumonia  Labs are consistent with DKA.  CT head is negative.  Patient also has AKI.  Patient is getting fluids.  Discussed with patient he is willing to stay in the hospital.  Will admit to the hospital team        ____________________________________________   FINAL CLINICAL IMPRESSION(S) / ED DIAGNOSES   Final diagnoses:  Diabetic ketoacidosis without coma associated with type 2 diabetes mellitus ([place])  Altered mental status, unspecified altered mental status type  AKI (acute kidney injury) ([place])      MEDICATIONS GIVEN DURING THIS VISIT:  Medications  insulin regular, human (MYXREDLIN) 100 units/ 100 mL infusion (has no administration in time range)   lactated ringers infusion (has no administration in time range)  dextrose 5 % in lactated ringers infusion (has no administration in time range)  dextrose 50 % solution 0-50 mL (has no administration in time range)  potassium chloride 10 mEq in 100 mL IVPB (has no administration in time range)  sodium  chloride 0.9 % bolus 500 mL (500 mLs Intravenous Bolus 07/20/20 1225)     ED Discharge Orders    None       Note:  This document was prepared using Dragon voice recognition software and may include unintentional dictation errors.   Vanessa [place], MD 07/20/20 1341

## 2020-07-20 NOTE — H&P (Signed)
History and Physical    Allen Hunter. TJQ:[id number] DOB: 02/09/1941 DOA: 07/20/2020  PCP: Allen Borg, MD   Patient coming from: Home  I have personally briefly reviewed patient's old medical records in [place]  Chief Complaint: Confusion  HPI: Allen Hunter. is a 80 y.o. male with medical history significant for diabetes mellitus with complications of stage III chronic kidney disease, history of prostate cancer, dyslipidemia who was brought into the emergency room by EMS for evaluation of mental status changes.  Patient is unable to tell me why he came to the hospital but by EMS he had not been eating or drinking for couple of days.  When EMS arrived patient had a blood sugar of 357 and was tachycardic with heart rate of 120 bpm. He is oriented to person and place which is not his baseline. He denies having any chest pain, no shortness of breath, no nausea, no vomiting, no abdominal pain, no changes in his bowel habits, no dizziness, no lightheadedness, no headache, no fever, no chills, no palpitations, no diaphoresis, no leg swelling, no cough. Labs show sodium 133, potassium 4.9, chloride 99, bicarb 11, serum glucose 302, BUN 42, creatinine 1.7 compared to baseline of 1.1, calcium 9.2, magnesium 2.1, alkaline phosphatase 5 1, albumin 3.9, AST 19, ALT 12, ammonia level 17, total bilirubin 2.5, troponin 14, white count 3.4, hemoglobin 15.6, hematocrit 49.4, MCV 94.8, RDW 15.1, platelet count 191 Chest x-ray reviewed by me shows no acute cardiopulmonary disease CT scan of the head without contrast shows no evidence of acute intracranial abnormality. Mild generalized cerebral volume loss and mild probable chronic small-vessel white matter ischemic changes. 2.5 cm posterior RIGHT scalp lesion most likely a sebaceous cyst. Twelve-lead EKG reviewed by me shows sinus tachycardia.   ED Course: Patient is an 80 year old African-American male who was brought into the ER by EMS for  evaluation of mental status changes.  He is oriented to person and place but not to time which is not his baseline.  Patient appears to be in DKA, with an anion gap of 23.  He has been started on an insulin infusion in the ER and will be admitted to the hospital for further evaluation.   Review of Systems: As per HPI otherwise all other systems reviewed and negative.    Past Medical History:  Diagnosis Date  . ALLERGIC RHINITIS 10/04/2008  . ASTHMA 10/04/2008  . Asthma   . BACK PAIN 04/18/2008  . BENIGN PROSTATIC HYPERTROPHY 03/09/2007  . COLONIC POLYPS, HX OF 03/09/2007  . COPD 10/20/2007  . DIABETES MELLITUS, TYPE II 03/09/2007  . FATIGUE 10/05/2007  . GERD (gastroesophageal reflux disease) 03/01/2012  . GOUT 10/19/2006  . HYPERLIPIDEMIA 10/19/2006  . HYPERTENSION 10/19/2006  . OSA (obstructive sleep apnea) 07/19/2015  . Prostate cancer ([place]) 01/19/2012  . SWELLING MASS OR LUMP IN HEAD AND NECK 01/09/2010    Past Surgical History:  Procedure Laterality Date  . APPENDECTOMY       reports that he quit smoking about 35 years ago. His smoking use included cigarettes. He has a 48.00 pack-year smoking history. He has never used smokeless tobacco. He reports that he does not drink alcohol and does not use drugs.  No Known Allergies  Family History  Problem Relation Age of Onset  . Hypertension Mother   . Cancer Other       Prior to Admission medications   Medication Sig Start Date End Date Taking? Authorizing Provider  allopurinol (ZYLOPRIM) 300 MG tablet TAKE 1 TABLET DAILY 06/24/20   Allen Borg, MD  aspirin 81 MG tablet Take 81 mg by mouth daily.    [provider]  atorvastatin (LIPITOR) 80 MG tablet TAKE 1 TABLET DAILY 10/24/19   Allen Borg, MD  bisoprolol (ZEBETA) 5 MG tablet TAKE 1 TABLET DAILY 04/22/20   Nahser, Wonda Cheng, MD  cetirizine (ZYRTEC) 10 MG tablet  01/18/17   [provider]  Dulaglutide (TRULICITY) 2.22 LN/9.8XQ SOPN Inject 0.5 mLs (0.75 mg  total) into the skin once a week. 09/06/19   Allen Borg, MD  esomeprazole (NEXIUM) 40 MG capsule TAKE 1 CAPSULE DAILY 10/23/19   Allen Borg, MD  fluticasone [facility]) 50 MCG/ACT nasal spray Place 1 spray into both nostrils daily. Patient not taking: Reported on 06/11/2020    [provider]  Fluticasone-Umeclidin-Vilant (TRELEGY ELLIPTA) 100-62.5-25 MCG/INH AEPB Inhale 1 puff into the lungs daily. 11/17/17   Allen Borg, MD  FREESTYLE LITE test strip USE 1 STRIP TWICE A DAY AS NEEDED TO CHECK SUGAR 08/05/19   Allen Borg, MD  glipiZIDE (GLUCOTROL XL) 10 MG 24 hr tablet TAKE 1 TABLET DAILY WITH BREAKFAST 05/31/20   Allen Borg, MD  Ipratropium-Albuterol (COMBIVENT RESPIMAT) 20-100 MCG/ACT AERS respimat INHALE 1 PUFF BY ORAL INHALATION FOUR TIMES A DAY FOR BREATHING-DO NOT USE SPACERS WITH THIS MEDICINE 03/11/20 03/12/21  [provider]  JARDIANCE 25 MG TABS tablet TAKE 1 TABLET DAILY 10/02/19   Allen Borg, MD  Lactulose 20 GM/30ML SOLN 30 ml every 8 hrs as needed Patient not taking: Reported on 06/11/2020 05/10/19   Allen Borg, MD  Lancets MISC Use asd 1 per day 250.02 10/26/13   Allen Borg, MD  metFORMIN (GLUCOPHAGE) 1000 MG tablet TAKE 1 TABLET TWICE A DAY WITH A MEAL 05/21/20   Allen Borg, MD  mirabegron ER (MYRBETRIQ) 25 MG TB24 tablet Take 1 tablet (25 mg total) by mouth daily. 05/17/20   Allen Borg, MD  montelukast (SINGULAIR) 10 MG tablet TAKE 1 TABLET (10MG  TOTAL ) BY MOUTH AT BEDTIME 01/18/13   Allen Borg, MD  Multiple Vitamins-Minerals (CENTRUM MEN PO) Take 1 tablet by mouth daily.    [provider]  Omega-3 Fatty Acids (FISH OIL) 500 MG CAPS Take by mouth daily.    [provider]  sildenafil (VIAGRA) 100 MG tablet Take one tablet by mouth daily as needed Patient not taking: Reported on 07/17/2020 04/18/20   Nahser, Wonda Cheng, MD  tamsulosin (FLOMAX) 0.4 MG CAPS capsule Take 1 capsule (0.4 mg total) by mouth daily. Patient not taking:  Reported on 06/11/2020 05/18/17   Allen Borg, MD  trospium (SANCTURA) 20 MG tablet Take 20 mg by mouth 2 (two) times daily. 06/08/19   [provider]  vitamin B-12 (CYANOCOBALAMIN) 1000 MCG tablet Take 1,000 mcg by mouth daily.    [provider]    Physical Exam: Vitals:   07/20/20 1208 07/20/20 1211  BP:  119/79  Pulse:  (!) 108  Temp:  (!) 97.5 F (36.4 C)  TempSrc:  Oral  SpO2:  96%  Weight: 74.8 kg   Height: 5\' 5"  (1.651 m)      Vitals:   07/20/20 1208 07/20/20 1211  BP:  119/79  Pulse:  (!) 108  Temp:  (!) 97.5 F (36.4 C)  TempSrc:  Oral  SpO2:  96%  Weight: 74.8 kg   Height:  5\' 5"  (1.651 m)       Constitutional: Alert and oriented x 2, oriented to person and place. Not in any apparent distress HEENT:      Head: Normocephalic and atraumatic.         Eyes: PERLA, EOMI, Conjunctivae are normal. Sclera is non-icteric.       Mouth/Throat: Mucous membranes are dry.       Neck: Supple with no signs of meningismus. Cardiovascular:  Sinus tachycardia. No murmurs, gallops, or rubs. 2+ symmetrical distal pulses are present . No JVD. No LE edema Respiratory: Respiratory effort normal .Lungs sounds clear bilaterally. No wheezes, crackles, or rhonchi.  Gastrointestinal: Soft, non tender, and non distended with positive bowel sounds.  Genitourinary: No CVA tenderness. Musculoskeletal: Nontender with normal range of motion in all extremities. No cyanosis, or erythema of extremities. Neurologic:  Face is symmetric. Moving all extremities. No gross focal neurologic deficits . Skin: Skin is warm, dry.  No rash or ulcers Psychiatric: Mood and affect are normal   Labs on Admission: I have personally reviewed following labs and imaging studies  CBC: Recent Labs  Lab 07/20/20 1213  WBC 3.4*  NEUTROABS 2.2  HGB 15.6  HCT 49.4  MCV 94.8  PLT 941   Basic Metabolic Panel: Recent Labs  Lab 07/20/20 1213  NA 133*  K 4.9  CL 99  CO2 11*  GLUCOSE 302*   BUN 42*  CREATININE 1.77*  CALCIUM 9.2  MG 2.1   GFR: Estimated Creatinine Clearance: 30.9 mL/min (A) (by C-G formula based on SCr of 1.77 mg/dL (H)). Liver Function Tests: Recent Labs  Lab 07/20/20 1213  AST 19  ALT 12  ALKPHOS 51  BILITOT 2.5*  PROT 6.8  ALBUMIN 3.9   No results for input(s): LIPASE, AMYLASE in the last 168 hours. Recent Labs  Lab 07/20/20 1214  AMMONIA 17   Coagulation Profile: No results for input(s): INR, PROTIME in the last 168 hours. Cardiac Enzymes: No results for input(s): CKTOTAL, CKMB, CKMBINDEX, TROPONINI in the last 168 hours. BNP (last 3 results) No results for input(s): PROBNP in the last 8760 hours. HbA1C: No results for input(s): HGBA1C in the last 72 hours. CBG: Recent Labs  Lab 07/20/20 1340  GLUCAP 285*   Lipid Profile: No results for input(s): CHOL, HDL, LDLCALC, TRIG, CHOLHDL, LDLDIRECT in the last 72 hours. Thyroid Function Tests: No results for input(s): TSH, T4TOTAL, FREET4, T3FREE, THYROIDAB in the last 72 hours. Anemia Panel: No results for input(s): VITAMINB12, FOLATE, FERRITIN, TIBC, IRON, RETICCTPCT in the last 72 hours. Urine analysis:    Component Value Date/Time   COLORURINE YELLOW 11/28/2019 1543   APPEARANCEUR CLEAR 11/28/2019 1543   LABSPEC 1.028 11/28/2019 1543   PHURINE < OR = 5.0 11/28/2019 1543   GLUCOSEU 3+ (A) 11/28/2019 1543   GLUCOSEU >=1000 (A) 05/10/2019 1454   HGBUR NEGATIVE 11/28/2019 1543   BILIRUBINUR NEGATIVE 05/10/2019 1454   KETONESUR TRACE (A) 11/28/2019 1543   PROTEINUR NEGATIVE 11/28/2019 1543   UROBILINOGEN 0.2 05/10/2019 1454   NITRITE NEGATIVE 11/28/2019 1543   LEUKOCYTESUR NEGATIVE 11/28/2019 1543    Radiological Exams on Admission: DG Chest 2 View  Result Date: 07/20/2020 CLINICAL DATA:  Altered mental status EXAM: CHEST - 2 VIEW COMPARISON:  10/20/2007 FINDINGS: The heart size and mediastinal contours are within normal limits. Both lungs are clear. The visualized skeletal  structures are unremarkable. IMPRESSION: No active cardiopulmonary disease. Electronically Signed   By: Kathreen Devoid   On: 07/20/2020 13:11  CT Head Wo Contrast  Result Date: 07/20/2020 CLINICAL DATA:  80 year old male with altered mental status. EXAM: CT HEAD WITHOUT CONTRAST TECHNIQUE: Contiguous axial images were obtained from the base of the skull through the vertex without intravenous contrast. COMPARISON:  None. FINDINGS: Brain: No evidence of acute infarction, hemorrhage, hydrocephalus, extra-axial collection or mass lesion/mass effect. Mild generalized cerebral volume loss and mild probable chronic small-vessel white matter ischemic changes are noted. Vascular: Carotid atherosclerotic calcifications are noted. Skull: Normal. Negative for fracture or focal lesion. Sinuses/Orbits: No acute finding. Other: A 2.5 cm posterior RIGHT scalp lesion most likely represents a sebaceous cyst. Correlate clinically. IMPRESSION: 1. No evidence of acute intracranial abnormality. 2. Mild generalized cerebral volume loss and mild probable chronic small-vessel white matter ischemic changes. 3. 2.5 cm posterior RIGHT scalp lesion most likely a sebaceous cyst. Correlate clinically. Electronically Signed   By: Margarette Canada M.D.   On: 07/20/2020 13:16     Assessment/Plan Principal Problem:   DKA (diabetic ketoacidosis) ([place]) Active Problems:   COPD (chronic obstructive pulmonary disease) ([facility])   Hyponatremia   Dehydration   Diabetic ketoacidosis Patient presents for evaluation of mental status changes and at the time of his admission he was only oriented to person and place not to time. This is not the patient's baseline Labs show an anion gap metabolic acidosis with hyperglycemia Start patient on an insulin drip Check and supplement electrolytes every 6 hours Aggressive IV fluid hydration    Hyponatremia Most likely secondary to hyperglycemia and dehydration Expect improvement with IV fluid hydration  and improvement in glycemic control    COPD Stable and not acutely exacerbated Continue as needed bronchodilator therapy as well as inhaled steroids    Dehydration Most likely secondary to poor oral intake Patient's baseline serum creatinine is 1.1 and on admission it is 1.7 Expect improvement in patient's renal function with IV fluid hydration Repeat renal parameters in a.m.  DVT prophylaxis: Lovenox Code Status: full code  Family Communication: Spoke to patient's cousin who is listed as his emergency contact Ms Carmelia Bake.  All questions and concerns have been addressed.  She verbalizes understanding and agrees with the plan. Disposition Plan: Back to previous home environment Consults called: none Status: At the time of admission, it appears that the appropriate admission status for this patient is inpatient. This is judged to be reasonable and necessary in order to provide the required intensity of service to ensure the patient's safety given the presenting symptoms, physical exam findings and initial radiographic and laboratory data in the context of their comorbid conditions. Patient requires inpatient status due to high intensity of service, high risk for further deterioration and high frequency of surveillance required.    Collier Bullock MD [facility]     07/20/2020, 2:16 PM

## 2020-07-20 NOTE — ED Notes (Signed)
Pharmacy tech states she will do med rec shortly

## 2020-07-20 NOTE — ED Notes (Signed)
Sent Dr Francine Graven a message inquiring about pt NPO status d/t pt stating he was hungry and asking for food

## 2020-07-20 NOTE — ED Notes (Signed)
Message sent to pharmacy to verify medications 

## 2020-07-20 NOTE — ED Notes (Addendum)
Pt cbg 237; rate/dose change to 6 units

## 2020-07-20 NOTE — ED Triage Notes (Signed)
Pt came ems; altered mental status has not been eating or drinking; did not go to doctor before; cbg 357; hr 120; has not felt good for two days; significant other has not been home to help;

## 2020-07-21 DIAGNOSIS — N179 Acute kidney failure, unspecified: Secondary | ICD-10-CM

## 2020-07-21 LAB — BASIC METABOLIC PANEL
Anion gap: 11 (ref 5–15)
BUN: 34 mg/dL — ABNORMAL HIGH (ref 8–23)
CO2: 21 mmol/L — ABNORMAL LOW (ref 22–32)
Calcium: 8.8 mg/dL — ABNORMAL LOW (ref 8.9–10.3)
Chloride: 105 mmol/L (ref 98–111)
Creatinine, Ser: 1.18 mg/dL (ref 0.61–1.24)
GFR, Estimated: >60 mL/min (ref >60)
Glucose, Bld: 151 mg/dL — ABNORMAL HIGH (ref 70–99)
Potassium: 4 mmol/L (ref 3.5–5.1)
Sodium: 137 mmol/L (ref 135–145)

## 2020-07-21 LAB — GLUCOSE, CAPILLARY
Glucose-Capillary: 123 mg/dL — ABNORMAL HIGH (ref 70–99)
Glucose-Capillary: 242 mg/dL — ABNORMAL HIGH (ref 70–99)
Glucose-Capillary: 259 mg/dL — ABNORMAL HIGH (ref 70–99)
Glucose-Capillary: 290 mg/dL — ABNORMAL HIGH (ref 70–99)
Glucose-Capillary: 313 mg/dL — ABNORMAL HIGH (ref 70–99)

## 2020-07-21 LAB — CBG MONITORING, ED: Glucose-Capillary: 123 mg/dL — ABNORMAL HIGH (ref 70–99)

## 2020-07-21 LAB — BETA-HYDROXYBUTYRIC ACID: Beta-Hydroxybutyric Acid: 4.49 mmol/L — ABNORMAL HIGH (ref 0.05–0.27)

## 2020-07-21 MED ORDER — INSULIN ASPART 100 UNIT/ML IJ SOLN
4.0000 [IU] | Freq: Three times a day (TID) | INTRAMUSCULAR | Status: DC
  Administered 2020-07-21 – 2020-07-25 (×12): 4 [IU] via SUBCUTANEOUS
  Filled 2020-07-21 (×12): qty 1

## 2020-07-21 MED ORDER — INSULIN ASPART 100 UNIT/ML IJ SOLN
0.0000 [IU] | INTRAMUSCULAR | Status: DC
Start: 2020-07-21 — End: 2020-07-22
  Administered 2020-07-21: 17:00:00 5 [IU] via SUBCUTANEOUS
  Administered 2020-07-21: 23:00:00 8 [IU] via SUBCUTANEOUS
  Administered 2020-07-21: 13:00:00 11 [IU] via SUBCUTANEOUS
  Administered 2020-07-22: 8 [IU] via SUBCUTANEOUS
  Administered 2020-07-22: 05:00:00 2 [IU] via SUBCUTANEOUS
  Administered 2020-07-22: 22:00:00 11 [IU] via SUBCUTANEOUS
  Administered 2020-07-22 (×2): 5 [IU] via SUBCUTANEOUS
  Filled 2020-07-21 (×8): qty 1

## 2020-07-21 NOTE — Progress Notes (Addendum)
Inpatient Diabetes Program Recommendations  AACE/ADA: New Consensus Statement on Inpatient Glycemic Control (2015)  Target Ranges:  Prepandial:   less than 140 mg/dL      Peak postprandial:   less than 180 mg/dL (1-2 hours)      Critically ill patients:  140 - 180 mg/dL   Lab Results  Component Value Date   GLUCAP 123 (H) 07/21/2020   HGBA1C 13.4 (H) 07/20/2020   Results for ORLONDO, [place] (MRN [id number]) as of 07/21/2020 09:25  Ref. Range 07/21/2020 04:48  Beta-Hydroxybutyric Acid Latest Ref Range: 0.05 - 0.27 mmol/L 4.49 (H)   Review of Glycemic Control  Diabetes history: DM2 Outpatient Diabetes medications: Trulicity (not taking) + Jardiance 25 + Glucotrol 10 mg qd + Metformin 500 mg bid  Current orders for Inpatient glycemic control: Levemir 22 units + Novolog 0-15 units tid correction + hs 0-5 units  Inpatient Diabetes Program Recommendations:    Beta-Hydroxybutyric Acid 4.49- IV insulin is now discontinued.Consider adding glucose to IVs to help clear and add back IV insulin if needed.  Noted A1c13.4 (average blood glucose 388 over the past 2-3 months ) patient had not been eating or drinking for 2 days prior to admission. Will followup to evaluate if patient had been taking his Jardiance which can cause the DKA.  Discussed with Dr. Billie Ruddy and Allen Hunter. Is now eating and drinking so will follow labs.  Thank you, [name]. Allen Chaffin, RN, MSN, CDE  Diabetes Coordinator Inpatient Glycemic Control Team Team Pager [phone] (8am-5pm) 07/21/2020 9:31 AM

## 2020-07-21 NOTE — Progress Notes (Signed)
PROGRESS NOTE    Allen Hunter.  QMG:[id number] DOB: 18-Nov-1940 DOA: 07/20/2020 PCP: [name], MD  102A/102A-AA   Assessment & Plan:   Principal Problem:   DKA (diabetic ketoacidosis) ([place]) Active Problems:   COPD (chronic obstructive pulmonary disease) ([place])   Hyponatremia   Dehydration   Allen Hunter. is a 80 y.o. male with medical history significant for diabetes mellitus with complications of stage III chronic kidney disease, history of prostate cancer, dyslipidemia who was brought into the emergency room by EMS for evaluation of mental status changes.  Patient is unable to tell me why he came to the hospital but by EMS he had not been eating or drinking for couple of days.    Diabetic ketoacidosis --s/p insulin gtt and IVF, however, keto acid was not cleared by the time pt arrived on the floor. --started on subQ insulin Plan: --Need to clear keto acid by insulin --SSI q4h and mealtime 4u TID --D5@50  to give enough BG for insulin adminstration --recheck keto acid tomorrow morning --cont LR@50  --cont Levemir 22u daily  Acute metabolic encephalopathy  --likely 2/2 DKA --treat DKA  PseudoHyponatremia, resolved secondary to hyperglycemia, corrected to normal   COPD Stable and not acutely exacerbated --cont scheduled bronchodilators  AKI, POA, improved Most likely secondary to dehydration from poor oral intake and hyperglycemia Plan: --cont LR@50    DVT prophylaxis: Lovenox SQ Code Status: Full code  Family Communication:  Level of care: Med-Surg Dispo:   The patient is from: home Anticipated d/c is to: home Anticipated d/c date is: 1-2 days Patient currently is not medically ready to d/c due to: keto acid not cleared yet   Subjective and Interval History:  Pt reported feeling better, although seemed confused and gave inconsistent hx.  Started eating and drinking normally.   Objective: Vitals:   07/21/20 0743 07/21/20 1159 07/21/20 1652  07/21/20 1958  BP: (!) 87/60 (!) 87/61 (!) 89/64 104/78  Pulse: 74 77 68 77  Resp: 18 15  18   Temp: 97.9 F (36.6 C) 97.6 F (36.4 C) 97.7 F (36.5 C) (!) 97.3 F (36.3 C)  TempSrc:    Oral  SpO2: 100% 95% 96% 98%  Weight:      Height:        Intake/Output Summary (Last 24 hours) at 07/21/2020 2002 Last data filed at 07/21/2020 1805 Gross per 24 hour  Intake 1718.96 ml  Output 400 ml  Net 1318.96 ml   Filed Weights   07/20/20 1208 07/21/20 0340  Weight: 74.8 kg 73.5 kg    Examination:   Constitutional: NAD, alert, confused HEENT: conjunctivae and lids normal, EOMI CV: No cyanosis.   RESP: normal respiratory effort, on RA Extremities: No effusions, edema in BLE SKIN: warm, dry Neuro: II - XII grossly intact.   Psych: Normal mood and affect.     Data Reviewed: I have personally reviewed following labs and imaging studies  CBC: Recent Labs  Lab 07/20/20 1213  WBC 3.4*  NEUTROABS 2.2  HGB 15.6  HCT 49.4  MCV 94.8  PLT 326   Basic Metabolic Panel: Recent Labs  Lab 07/20/20 1213 07/20/20 1739 07/20/20 2201 07/21/20 0448  NA 133* 137 135 137  K 4.9 4.5 4.1 4.0  CL 99 105 105 105  CO2 11* 19* 20* 21*  GLUCOSE 302* 113* 252* 151*  BUN 42* 41* 39* 34*  CREATININE 1.77* 1.52* 1.22 1.18  CALCIUM 9.2 9.2 8.8* 8.8*  MG 2.1  --   --   --  GFR: Estimated Creatinine Clearance: 42.7 mL/min (by C-G formula based on SCr of 1.18 mg/dL). Liver Function Tests: Recent Labs  Lab 07/20/20 1213  AST 19  ALT 12  ALKPHOS 51  BILITOT 2.5*  PROT 6.8  ALBUMIN 3.9   No results for input(s): LIPASE, AMYLASE in the last 168 hours. Recent Labs  Lab 07/20/20 1214  AMMONIA 17   Coagulation Profile: No results for input(s): INR, PROTIME in the last 168 hours. Cardiac Enzymes: No results for input(s): CKTOTAL, CKMB, CKMBINDEX, TROPONINI in the last 168 hours. BNP (last 3 results) No results for input(s): PROBNP in the last 8760 hours. HbA1C: Recent Labs     07/20/20 1739  HGBA1C 13.4*   CBG: Recent Labs  Lab 07/20/20 2348 07/21/20 0128 07/21/20 0745 07/21/20 1201 07/21/20 1657  GLUCAP 123* 123* 123* 313* 242*   Lipid Profile: No results for input(s): CHOL, HDL, LDLCALC, TRIG, CHOLHDL, LDLDIRECT in the last 72 hours. Thyroid Function Tests: No results for input(s): TSH, T4TOTAL, FREET4, T3FREE, THYROIDAB in the last 72 hours. Anemia Panel: No results for input(s): VITAMINB12, FOLATE, FERRITIN, TIBC, IRON, RETICCTPCT in the last 72 hours. Sepsis Labs: No results for input(s): PROCALCITON, LATICACIDVEN in the last 168 hours.  Recent Results (from the past 240 hour(s))  Resp Panel by RT-PCR (Flu A&B, Covid) Nasopharyngeal Swab     Status: None   Collection Time: 07/20/20  1:43 PM   Specimen: Nasopharyngeal Swab; Nasopharyngeal(NP) swabs in vial transport medium  Result Value Ref Range Status   SARS Coronavirus 2 by RT PCR NEGATIVE NEGATIVE Final    Comment: (NOTE) SARS-CoV-2 target nucleic acids are NOT DETECTED.  The SARS-CoV-2 RNA is generally detectable in upper respiratory specimens during the acute phase of infection. The lowest concentration of SARS-CoV-2 viral copies this assay can detect is 138 copies/mL. A negative result does not preclude SARS-Cov-2 infection and should not be used as the sole basis for treatment or other patient management decisions. A negative result may occur with  improper specimen collection/handling, submission of specimen other than nasopharyngeal swab, presence of viral mutation(s) within the areas targeted by this assay, and inadequate number of viral copies(<138 copies/mL). A negative result must be combined with clinical observations, patient history, and epidemiological information. The expected result is Negative.  Fact Sheet for Patients:  EntrepreneurPulse.com.au  Fact Sheet for Healthcare Providers:  IncredibleEmployment.be  This test is no t yet  approved or cleared by the [place] FDA and  has been authorized for detection and/or diagnosis of SARS-CoV-2 by FDA under an Emergency Use Authorization (EUA). This EUA will remain  in effect (meaning this test can be used) for the duration of the COVID-19 declaration under Section 564(b)(1) of the Act, 21 U.S.C.section 360bbb-3(b)(1), unless the authorization is terminated  or revoked sooner.       Influenza A by PCR NEGATIVE NEGATIVE Final   Influenza B by PCR NEGATIVE NEGATIVE Final    Comment: (NOTE) The Xpert Xpress SARS-CoV-2/FLU/RSV plus assay is intended as an aid in the diagnosis of influenza from Nasopharyngeal swab specimens and should not be used as a sole basis for treatment. Nasal washings and aspirates are unacceptable for Xpert Xpress SARS-CoV-2/FLU/RSV testing.  Fact Sheet for Patients: EntrepreneurPulse.com.au  Fact Sheet for Healthcare Providers: IncredibleEmployment.be  This test is not yet approved or cleared by the [place] FDA and has been authorized for detection and/or diagnosis of SARS-CoV-2 by FDA under an Emergency Use Authorization (EUA). This EUA will remain in effect (meaning  this test can be used) for the duration of the COVID-19 declaration under Section 564(b)(1) of the Act, 21 U.S.C. section 360bbb-3(b)(1), unless the authorization is terminated or revoked.  Performed at [facility], [address] [place], [place] [address]       Radiology Studies: DG Chest 2 View  Result Date: 07/20/2020 CLINICAL DATA:  Altered mental status EXAM: CHEST - 2 VIEW COMPARISON:  10/20/2007 FINDINGS: The heart size and mediastinal contours are within normal limits. Both lungs are clear. The visualized skeletal structures are unremarkable. IMPRESSION: No active cardiopulmonary disease. Electronically Signed   By: Kathreen Devoid   On: 07/20/2020 13:11   CT Head Wo Contrast  Result Date:  07/20/2020 CLINICAL DATA:  80 year old male with altered mental status. EXAM: CT HEAD WITHOUT CONTRAST TECHNIQUE: Contiguous axial images were obtained from the base of the skull through the vertex without intravenous contrast. COMPARISON:  None. FINDINGS: Brain: No evidence of acute infarction, hemorrhage, hydrocephalus, extra-axial collection or mass lesion/mass effect. Mild generalized cerebral volume loss and mild probable chronic small-vessel white matter ischemic changes are noted. Vascular: Carotid atherosclerotic calcifications are noted. Skull: Normal. Negative for fracture or focal lesion. Sinuses/Orbits: No acute finding. Other: A 2.5 cm posterior RIGHT scalp lesion most likely represents a sebaceous cyst. Correlate clinically. IMPRESSION: 1. No evidence of acute intracranial abnormality. 2. Mild generalized cerebral volume loss and mild probable chronic small-vessel white matter ischemic changes. 3. 2.5 cm posterior RIGHT scalp lesion most likely a sebaceous cyst. Correlate clinically. Electronically Signed   By: Margarette Canada M.D.   On: 07/20/2020 13:16     Scheduled Meds: . allopurinol  300 mg Oral Daily  . aspirin EC  81 mg Oral Daily  . atorvastatin  80 mg Oral Daily  . bisoprolol  5 mg Oral Daily  . darifenacin  7.5 mg Oral Daily  . enoxaparin (LOVENOX) injection  40 mg Subcutaneous Q24H  . fluticasone furoate-vilanterol  1 puff Inhalation Daily   And  . umeclidinium bromide  1 puff Inhalation Daily  . insulin aspart  0-15 Units Subcutaneous Q4H  . insulin aspart  4 Units Subcutaneous TID WC  . insulin detemir  0.3 Units/kg Subcutaneous Q24H  . Ipratropium-Albuterol  1 puff Inhalation Q6H  . loratadine  10 mg Oral Daily  . mirabegron ER  25 mg Oral Daily  . montelukast  10 mg Oral QHS  . multivitamin with minerals  1 tablet Oral Daily  . omega-3 acid ethyl esters  1 g Oral Daily  . vitamin B-12  1,000 mcg Oral Daily   Continuous Infusions: . sodium chloride    . dextrose 5%  lactated ringers Stopped (07/21/20 0119)  . lactated ringers    . lactated ringers Stopped (07/21/20 0324)     LOS: 1 day     [name], MD [facility] If 7PM-7AM, please contact night-coverage 07/21/2020, 8:02 PM

## 2020-07-21 NOTE — TOC Initial Note (Signed)
Error note

## 2020-07-21 NOTE — ED Notes (Signed)
Informed Allen Hunter of bed assignment.

## 2020-07-21 NOTE — TOC Progression Note (Signed)
Transition of Care (TOC) - Progression Note    Patient Details  Name: Allen Hunter. MRN: [id number] Date of Birth: Oct 25, 1940  Transition of Care [facility]) CM/SW [place], [place] Phone Number: 07/21/2020, 1:33 PM  Clinical Narrative:     Patient is a 80 year old male that came to the emergency room from home by EMS for evaluation of mental status changes. Patient had a blood sugar of 357 when he arrived and was tachycardic with heart rate of 120bpm.  CSW talked with patient who still seemed confused. Patient was confused with terminology. CSW asked about medical equipment and patient kept saying "I don't know what that means" and kept stating that he was going home on Wednesday.   CSW will continue disposition.   Expected Discharge Plan: Home/Self Care Barriers to Discharge: Continued Medical Work up  Expected Discharge Plan and Services Expected Discharge Plan: Home/Self Care       Living arrangements for the past 2 months: Single Family Home                                       Social Determinants of Health (SDOH) Interventions    Readmission Risk Interventions No flowsheet data found.

## 2020-07-22 ENCOUNTER — Other Ambulatory Visit: Payer: Self-pay | Admitting: Internal Medicine

## 2020-07-22 DIAGNOSIS — J449 Chronic obstructive pulmonary disease, unspecified: Secondary | ICD-10-CM

## 2020-07-22 LAB — GLUCOSE, CAPILLARY
Glucose-Capillary: 132 mg/dL — ABNORMAL HIGH (ref 70–99)
Glucose-Capillary: 242 mg/dL — ABNORMAL HIGH (ref 70–99)
Glucose-Capillary: 64 mg/dL — ABNORMAL LOW (ref 70–99)
Glucose-Capillary: 236 mg/dL — ABNORMAL HIGH (ref 70–99)
Glucose-Capillary: 246 mg/dL — ABNORMAL HIGH (ref 70–99)
Glucose-Capillary: 332 mg/dL — ABNORMAL HIGH (ref 70–99)

## 2020-07-22 LAB — BASIC METABOLIC PANEL
Anion gap: 5 (ref 5–15)
BUN: 33 mg/dL — ABNORMAL HIGH (ref 8–23)
CO2: 28 mmol/L (ref 22–32)
Calcium: 9.1 mg/dL (ref 8.9–10.3)
Chloride: 108 mmol/L (ref 98–111)
Creatinine, Ser: 1.19 mg/dL (ref 0.61–1.24)
GFR, Estimated: >60 mL/min (ref >60)
Glucose, Bld: 100 mg/dL — ABNORMAL HIGH (ref 70–99)
Potassium: 3.8 mmol/L (ref 3.5–5.1)
Sodium: 141 mmol/L (ref 135–145)

## 2020-07-22 LAB — BETA-HYDROXYBUTYRIC ACID: Beta-Hydroxybutyric Acid: 0.13 mmol/L (ref 0.05–0.27)

## 2020-07-22 LAB — CBC
HCT: 40.4 % (ref 39.0–52.0)
Hemoglobin: 13.4 g/dL (ref 13.0–17.0)
MCH: 30.4 pg (ref 26.0–34.0)
MCHC: 33.2 g/dL (ref 30.0–36.0)
MCV: 91.6 fL (ref 80.0–100.0)
Platelets: 156 10*3/uL (ref 150–400)
RBC: 4.41 MIL/uL (ref 4.22–5.81)
RDW: 15 % (ref 11.5–15.5)
WBC: 3.7 10*3/uL — ABNORMAL LOW (ref 4.0–10.5)
nRBC: 0 % (ref 0.0–0.2)

## 2020-07-22 LAB — MAGNESIUM: Magnesium: 2.1 mg/dL (ref 1.7–2.4)

## 2020-07-22 MED ORDER — LIVING WELL WITH DIABETES BOOK
Freq: Once | Status: AC
  Filled 2020-07-22 (×2): qty 1

## 2020-07-22 MED ORDER — INSULIN ASPART 100 UNIT/ML IJ SOLN
0.0000 [IU] | Freq: Three times a day (TID) | INTRAMUSCULAR | Status: DC
  Administered 2020-07-23: 09:00:00 2 [IU] via SUBCUTANEOUS
  Administered 2020-07-23: 13:00:00 3 [IU] via SUBCUTANEOUS
  Administered 2020-07-23: 17:00:00 11 [IU] via SUBCUTANEOUS
  Administered 2020-07-24 (×3): 3 [IU] via SUBCUTANEOUS
  Administered 2020-07-25: 09:00:00 2 [IU] via SUBCUTANEOUS
  Administered 2020-07-25: 12:00:00 3 [IU] via SUBCUTANEOUS
  Administered 2020-07-25: 8 [IU] via SUBCUTANEOUS
  Administered 2020-07-26 – 2020-07-27 (×3): 5 [IU] via SUBCUTANEOUS
  Administered 2020-07-27: 3 [IU] via SUBCUTANEOUS
  Administered 2020-07-27: 5 [IU] via SUBCUTANEOUS
  Administered 2020-07-28: 3 [IU] via SUBCUTANEOUS
  Administered 2020-07-28: 17:00:00 2 [IU] via SUBCUTANEOUS
  Administered 2020-07-28: 5 [IU] via SUBCUTANEOUS
  Administered 2020-07-29: 17:00:00 2 [IU] via SUBCUTANEOUS
  Administered 2020-07-29: 12:00:00 3 [IU] via SUBCUTANEOUS
  Administered 2020-07-29: 2 [IU] via SUBCUTANEOUS
  Filled 2020-07-22 (×20): qty 1

## 2020-07-22 MED ORDER — DIPHENHYDRAMINE HCL 25 MG PO CAPS
25.0000 mg | ORAL_CAPSULE | Freq: Four times a day (QID) | ORAL | Status: DC | PRN
  Administered 2020-07-22: 13:00:00 25 mg via ORAL
  Filled 2020-07-22: qty 1

## 2020-07-22 MED ORDER — TRIAMCINOLONE ACETONIDE 0.1 % EX CREA
TOPICAL_CREAM | Freq: Two times a day (BID) | CUTANEOUS | Status: DC | PRN
  Filled 2020-07-22: qty 15

## 2020-07-22 MED ORDER — INSULIN STARTER KIT- SYRINGES (ENGLISH)
1.0000 | Freq: Once | Status: AC
  Administered 2020-07-22: 16:00:00 1
  Filled 2020-07-22: qty 1

## 2020-07-22 NOTE — Progress Notes (Signed)
Inpatient Diabetes Program Recommendations  AACE/ADA: New Consensus Statement on Inpatient Glycemic Control (2015)  Target Ranges:  Prepandial:   less than 140 mg/dL      Peak postprandial:   less than 180 mg/dL (1-2 hours)      Critically ill patients:  140 - 180 mg/dL   Lab Results  Component Value Date   GLUCAP 242 (H) 07/22/2020   HGBA1C 13.4 (H) 07/20/2020    Review of Glycemic Control Results for Allen Hunter, Allen Hunter (MRN [id number]) as of 07/22/2020 09:54  Ref. Range 07/21/2020 16:57 07/21/2020 20:25 07/21/2020 23:37 07/22/2020 03:39 07/22/2020 05:04 07/22/2020 07:44 07/22/2020 08:17  Glucose-Capillary Latest Ref Range: 70 - 99 mg/dL 242 (H) 259 (H) 290 (H) 132 (H)  64 (L) 242 (H)   Diabetes history: DM2 Outpatient Diabetes medications: Trulicity (not taking) + Jardiance 25 + Glucotrol 10 mg qd + Metformin 500 mg bid  Current orders for Inpatient glycemic control: Levemir 22 units + Novolog 0-15 units tid correction q 4 hrs.  Inpatient Diabetes Program Recommendations:   Consider: -Decrease Novoog correction to 0-9 units tid + hs 0-5 units  Thank you, Bethena Roys E. Schae Cando, RN, MSN, CDE  Diabetes Coordinator Inpatient Glycemic Control Team Team Pager [phone] (8am-5pm) 07/22/2020 10:00 AM

## 2020-07-22 NOTE — Progress Notes (Signed)
PROGRESS NOTE    [name]  OVF:[id number] DOB: Oct 11, 1940 DOA: 07/20/2020 PCP: [name], MD  102A/102A-AA   Assessment & Plan:   Principal Problem:   DKA (diabetic ketoacidosis) ([place]) Active Problems:   COPD (chronic obstructive pulmonary disease) ([place])   Hyponatremia   Dehydration   Allen Hunter. is a 80 y.o. male with medical history significant for diabetes mellitus with complications of stage III chronic kidney disease, history of prostate cancer, dyslipidemia who was brought into the emergency room by EMS for evaluation of mental status changes.  Patient is unable to tell me why he came to the hospital but by EMS he had not been eating or drinking for couple of days.    Diabetic ketoacidosis, POA, resolved --s/p insulin gtt and IVF, however, keto acid was not cleared by the time pt arrived on the floor. --started on subQ insulin  Plan: --change to ACHS and SSI TID --d/c IVF since eating well --cont Levemir 22u daily --cont mealtime 4u TID  Acute metabolic encephalopathy  --likely 2/2 DKA  PseudoHyponatremia, resolved secondary to hyperglycemia, corrected to normal   COPD Stable and not acutely exacerbated --cont scheduled bronchodilators  AKI, POA, improved Most likely secondary to dehydration from poor oral intake and hyperglycemia Plan: --d/c MIVF --encourage oral hydration  Rash over back --Kenalog cream BID PRN   DVT prophylaxis: Lovenox SQ Code Status: Full code  Family Communication:  Level of care: Med-Surg Dispo:   The patient is from: home Anticipated d/c is to: home Anticipated d/c date is: tomorrow Patient currently is medically ready to d/c.  Will need to complete diabetic education and be started on insulin.   Subjective and Interval History:  Pt reported feeling better.  Ate well.  Complained of itchy back.  Keto acid cleared.   Objective: Vitals:   07/22/20 0436 07/22/20 0748 07/22/20 1232 07/22/20 1620  BP:  (!) 92/59 (!) 104/57 98/63 (!) 88/57  Pulse: 72 69 65 70  Resp: 16 16 16 17   Temp: 97.8 F (36.6 C) 98.3 F (36.8 C) 98 F (36.7 C) 98.1 F (36.7 C)  TempSrc:      SpO2: 99% 96% 99% 97%  Weight:      Height:        Intake/Output Summary (Last 24 hours) at 07/22/2020 1858 Last data filed at 07/22/2020 1800 Gross per 24 hour  Intake 1423.1 ml  Output 150 ml  Net 1273.1 ml   Filed Weights   07/20/20 1208 07/21/20 0340  Weight: 74.8 kg 73.5 kg    Examination:   Constitutional: NAD, alert, oriented to person and place HEENT: conjunctivae and lids normal, EOMI CV: No cyanosis.   RESP: normal respiratory effort, on RA Extremities: No effusions, edema in BLE SKIN: warm, dry.  Macular rash over central upper back and central upper chest. Neuro: II - XII grossly intact.   Psych: Normal mood and affect.      Data Reviewed: I have personally reviewed following labs and imaging studies  CBC: Recent Labs  Lab 07/20/20 1213 07/22/20 0504  WBC 3.4* 3.7*  NEUTROABS 2.2  --   HGB 15.6 13.4  HCT 49.4 40.4  MCV 94.8 91.6  PLT 191 841   Basic Metabolic Panel: Recent Labs  Lab 07/20/20 1213 07/20/20 1739 07/20/20 2201 07/21/20 0448 07/22/20 0504  NA 133* 137 135 137 141  K 4.9 4.5 4.1 4.0 3.8  CL 99 105 105 105 108  CO2 11* 19* 20*  21* 28  GLUCOSE 302* 113* 252* 151* 100*  BUN 42* 41* 39* 34* 33*  CREATININE 1.77* 1.52* 1.22 1.18 1.19  CALCIUM 9.2 9.2 8.8* 8.8* 9.1  MG 2.1  --   --   --  2.1   GFR: Estimated Creatinine Clearance: 42.3 mL/min (by C-G formula based on SCr of 1.19 mg/dL). Liver Function Tests: Recent Labs  Lab 07/20/20 1213  AST 19  ALT 12  ALKPHOS 51  BILITOT 2.5*  PROT 6.8  ALBUMIN 3.9   No results for input(s): LIPASE, AMYLASE in the last 168 hours. Recent Labs  Lab 07/20/20 1214  AMMONIA 17   Coagulation Profile: No results for input(s): INR, PROTIME in the last 168 hours. Cardiac Enzymes: No results for input(s): CKTOTAL, CKMB,  CKMBINDEX, TROPONINI in the last 168 hours. BNP (last 3 results) No results for input(s): PROBNP in the last 8760 hours. HbA1C: Recent Labs    07/20/20 1739  HGBA1C 13.4*   CBG: Recent Labs  Lab 07/22/20 0339 07/22/20 0744 07/22/20 0817 07/22/20 1229 07/22/20 1616  GLUCAP 132* 64* 242* 236* 246*   Lipid Profile: No results for input(s): CHOL, HDL, LDLCALC, TRIG, CHOLHDL, LDLDIRECT in the last 72 hours. Thyroid Function Tests: No results for input(s): TSH, T4TOTAL, FREET4, T3FREE, THYROIDAB in the last 72 hours. Anemia Panel: No results for input(s): VITAMINB12, FOLATE, FERRITIN, TIBC, IRON, RETICCTPCT in the last 72 hours. Sepsis Labs: No results for input(s): PROCALCITON, LATICACIDVEN in the last 168 hours.  Recent Results (from the past 240 hour(s))  Resp Panel by RT-PCR (Flu A&B, Covid) Nasopharyngeal Swab     Status: None   Collection Time: 07/20/20  1:43 PM   Specimen: Nasopharyngeal Swab; Nasopharyngeal(NP) swabs in vial transport medium  Result Value Ref Range Status   SARS Coronavirus 2 by RT PCR NEGATIVE NEGATIVE Final    Comment: (NOTE) SARS-CoV-2 target nucleic acids are NOT DETECTED.  The SARS-CoV-2 RNA is generally detectable in upper respiratory specimens during the acute phase of infection. The lowest concentration of SARS-CoV-2 viral copies this assay can detect is 138 copies/mL. A negative result does not preclude SARS-Cov-2 infection and should not be used as the sole basis for treatment or other patient management decisions. A negative result may occur with  improper specimen collection/handling, submission of specimen other than nasopharyngeal swab, presence of viral mutation(s) within the areas targeted by this assay, and inadequate number of viral copies(<138 copies/mL). A negative result must be combined with clinical observations, patient history, and epidemiological information. The expected result is Negative.  Fact Sheet for Patients:   EntrepreneurPulse.com.au  Fact Sheet for Healthcare Providers:  IncredibleEmployment.be  This test is no t yet approved or cleared by the [place] FDA and  has been authorized for detection and/or diagnosis of SARS-CoV-2 by FDA under an Emergency Use Authorization (EUA). This EUA will remain  in effect (meaning this test can be used) for the duration of the COVID-19 declaration under Section 564(b)(1) of the Act, 21 U.S.C.section 360bbb-3(b)(1), unless the authorization is terminated  or revoked sooner.       Influenza A by PCR NEGATIVE NEGATIVE Final   Influenza B by PCR NEGATIVE NEGATIVE Final    Comment: (NOTE) The Xpert Xpress SARS-CoV-2/FLU/RSV plus assay is intended as an aid in the diagnosis of influenza from Nasopharyngeal swab specimens and should not be used as a sole basis for treatment. Nasal washings and aspirates are unacceptable for Xpert Xpress SARS-CoV-2/FLU/RSV testing.  Fact Sheet for Patients: EntrepreneurPulse.com.au  Fact Sheet for Healthcare Providers: IncredibleEmployment.be  This test is not yet approved or cleared by the [place] FDA and has been authorized for detection and/or diagnosis of SARS-CoV-2 by FDA under an Emergency Use Authorization (EUA). This EUA will remain in effect (meaning this test can be used) for the duration of the COVID-19 declaration under Section 564(b)(1) of the Act, 21 U.S.C. section 360bbb-3(b)(1), unless the authorization is terminated or revoked.  Performed at [facility], [address], [place], [place] [address]       Radiology Studies: No results found.   Scheduled Meds: . allopurinol  300 mg Oral Daily  . aspirin EC  81 mg Oral Daily  . atorvastatin  80 mg Oral Daily  . bisoprolol  5 mg Oral Daily  . darifenacin  7.5 mg Oral Daily  . enoxaparin (LOVENOX) injection  40 mg Subcutaneous Q24H  . fluticasone  furoate-vilanterol  1 puff Inhalation Daily   And  . umeclidinium bromide  1 puff Inhalation Daily  . insulin aspart  0-15 Units Subcutaneous Q4H  . insulin aspart  4 Units Subcutaneous TID WC  . insulin detemir  0.3 Units/kg Subcutaneous Q24H  . Ipratropium-Albuterol  1 puff Inhalation Q6H  . loratadine  10 mg Oral Daily  . mirabegron ER  25 mg Oral Daily  . montelukast  10 mg Oral QHS  . multivitamin with minerals  1 tablet Oral Daily  . omega-3 acid ethyl esters  1 g Oral Daily  . vitamin B-12  1,000 mcg Oral Daily   Continuous Infusions: . sodium chloride    . lactated ringers       LOS: 2 days     [name], MD [facility] If 7PM-7AM, please contact night-coverage 07/22/2020, 6:58 PM

## 2020-07-22 NOTE — Progress Notes (Signed)
Patient had 17 beat run of V-tach at 03:28am this morning; arriving to the patients bedside, patient is lying quietly in the bed awake. Patient denies chest pain, generalized pain or shortness of breath. Patients heart rhythm has been normal sinus with PAC's throughout the night. Notified MD. Patient has morning labs CMP, CBC and Mag levels to be drawn. Will continue to monitor for change.

## 2020-07-22 NOTE — Telephone Encounter (Signed)
Please refill as per office routine med refill policy (all routine meds refilled for 3 mo or monthly per pt preference up to one year from last visit, then month to month grace period for 3 mo, then further med refills will have to be denied)  

## 2020-07-23 ENCOUNTER — Telehealth: Payer: Self-pay | Admitting: Internal Medicine

## 2020-07-23 LAB — GLUCOSE, CAPILLARY
Glucose-Capillary: 132 mg/dL — ABNORMAL HIGH (ref 70–99)
Glucose-Capillary: 186 mg/dL — ABNORMAL HIGH (ref 70–99)
Glucose-Capillary: 187 mg/dL — ABNORMAL HIGH (ref 70–99)
Glucose-Capillary: 311 mg/dL — ABNORMAL HIGH (ref 70–99)
Glucose-Capillary: 315 mg/dL — ABNORMAL HIGH (ref 70–99)
Glucose-Capillary: 382 mg/dL — ABNORMAL HIGH (ref 70–99)

## 2020-07-23 LAB — CBC
HCT: 39.8 % (ref 39.0–52.0)
Hemoglobin: 13.1 g/dL (ref 13.0–17.0)
MCH: 30.2 pg (ref 26.0–34.0)
MCHC: 32.9 g/dL (ref 30.0–36.0)
MCV: 91.7 fL (ref 80.0–100.0)
Platelets: 140 10*3/uL — ABNORMAL LOW (ref 150–400)
RBC: 4.34 MIL/uL (ref 4.22–5.81)
RDW: 15 % (ref 11.5–15.5)
WBC: 3.4 10*3/uL — ABNORMAL LOW (ref 4.0–10.5)
nRBC: 0 % (ref 0.0–0.2)

## 2020-07-23 LAB — BASIC METABOLIC PANEL
Anion gap: 6 (ref 5–15)
BUN: 25 mg/dL — ABNORMAL HIGH (ref 8–23)
CO2: 28 mmol/L (ref 22–32)
Calcium: 9 mg/dL (ref 8.9–10.3)
Chloride: 107 mmol/L (ref 98–111)
Creatinine, Ser: 0.88 mg/dL (ref 0.61–1.24)
GFR, Estimated: >60 mL/min (ref >60)
Glucose, Bld: 175 mg/dL — ABNORMAL HIGH (ref 70–99)
Potassium: 3.7 mmol/L (ref 3.5–5.1)
Sodium: 141 mmol/L (ref 135–145)

## 2020-07-23 LAB — MAGNESIUM: Magnesium: 1.9 mg/dL (ref 1.7–2.4)

## 2020-07-23 MED ORDER — INSULIN SYRINGES (DISPOSABLE) U-100 0.5 ML MISC: 1.0000 [IU] | Freq: Two times a day (BID) | 0 refills | Status: DC

## 2020-07-23 MED ORDER — BLOOD GLUCOSE METER KIT: PACK | 0 refills | Status: AC

## 2020-07-23 MED ORDER — INSULIN ASPART PROT & ASPART (70-30 MIX) 100 UNIT/ML ~~LOC~~ SUSP: 16.0000 [IU] | Freq: Two times a day (BID) | SUBCUTANEOUS | 0 refills | Status: DC

## 2020-07-23 MED ORDER — TRIAMCINOLONE ACETONIDE 0.1 % EX CREA
TOPICAL_CREAM | Freq: Two times a day (BID) | CUTANEOUS | 0 refills | Status: DC | PRN
Start: 2020-07-23 — End: 2020-08-01

## 2020-07-23 MED ORDER — NOVOLIN 70/30 FLEXPEN RELION (70-30) 100 UNIT/ML ~~LOC~~ SUPN: 16.0000 [IU] | PEN_INJECTOR | Freq: Two times a day (BID) | SUBCUTANEOUS | 0 refills | Status: DC

## 2020-07-23 NOTE — Progress Notes (Addendum)
PROGRESS NOTE    Allen Hunter.  ZOX:[id number] DOB: [birth year] DOA: 07/20/2020 PCP: [name], MD  102A/102A-AA   Assessment & Plan:   Principal Problem:   DKA (diabetic ketoacidosis) ([place]) Active Problems:   COPD (chronic obstructive pulmonary disease) ([place])   Hyponatremia   Dehydration   Allen Hunter. is a 80 y.o. male with medical history significant for diabetes mellitus with complications of stage III chronic kidney disease, history of prostate cancer, dyslipidemia who was brought into the emergency room by EMS for evaluation of mental status changes.  Patient is unable to tell me why he came to the hospital but by EMS he had not been eating or drinking for couple of days.    Diabetic ketoacidosis, POA, resolved --s/p insulin gtt and IVF, however, keto acid was not cleared by the time pt arrived on the floor. --started on subQ insulin  Plan: --change to ACHS and SSI TID --cont Levemir 22u daily --cont mealtime 4u TID --will discharge on Novolog 70/30 16 units BID.  Cousin to pre-fill syringes and keep them in the fridge, for pt to use to inject himself.   --will discharge on metformin  Acute metabolic encephalopathy  --likely 2/2 DKA  PseudoHyponatremia, resolved secondary to hyperglycemia, corrected to normal   COPD Stable and not acutely exacerbated --cont bronchodilators  AKI, POA, improved Most likely secondary to dehydration from poor oral intake and hyperglycemia Plan: --encourage oral hydration  Rash over back --Kenalog cream BID PRN   DVT prophylaxis: Lovenox SQ Code Status: Full code  Family Communication: cousin updated at bedside today Level of care: Med-Surg Dispo:   The patient is from: home Anticipated d/c is to: home Anticipated d/c date is: tomorrow Patient currently is medically ready to d/c.     Subjective and Interval History:  RN tried very hard to teach pt to inject insulin himself, and pt's cousin how to help pt  re-fill syringes, however, both were having a hard time.  Pt was kept 1 more night to learn how to self inject himself with insulin.     Objective: Vitals:   07/23/20 0319 07/23/20 0738 07/23/20 1107 07/23/20 1607  BP: 105/76 98/67 96/66  97/66  Pulse: 76 77 78 80  Resp: 16 18 16 16   Temp: 97.9 F (36.6 C) 98.1 F (36.7 C) 98 F (36.7 C) 98 F (36.7 C)  TempSrc:  Oral    SpO2: 96% 99% 97% 97%  Weight:      Height:        Intake/Output Summary (Last 24 hours) at 07/23/2020 1811 Last data filed at 07/23/2020 1017 Gross per 24 hour  Intake 720 ml  Output 1450 ml  Net -730 ml   Filed Weights   07/20/20 1208 07/21/20 0340  Weight: 74.8 kg 73.5 kg    Examination:   Constitutional: NAD, AAOx3 HEENT: conjunctivae and lids normal, EOMI CV: No cyanosis.   RESP: normal respiratory effort, on RA Neuro: II - XII grossly intact.   Psych: Normal mood and affect.  Appropriate judgement and reason   Data Reviewed: I have personally reviewed following labs and imaging studies  CBC: Recent Labs  Lab 07/20/20 1213 07/22/20 0504 07/23/20 0433  WBC 3.4* 3.7* 3.4*  NEUTROABS 2.2  --   --   HGB 15.6 13.4 13.1  HCT 49.4 40.4 39.8  MCV 94.8 91.6 91.7  PLT 191 156 811*   Basic Metabolic Panel: Recent Labs  Lab 07/20/20 1213 07/20/20 1739 07/20/20  2201 07/21/20 0448 07/22/20 0504 07/23/20 0433  NA 133* 137 135 137 141 141  K 4.9 4.5 4.1 4.0 3.8 3.7  CL 99 105 105 105 108 107  CO2 11* 19* 20* 21* 28 28  GLUCOSE 302* 113* 252* 151* 100* 175*  BUN 42* 41* 39* 34* 33* 25*  CREATININE 1.77* 1.52* 1.22 1.18 1.19 0.88  CALCIUM 9.2 9.2 8.8* 8.8* 9.1 9.0  MG 2.1  --   --   --  2.1 1.9   GFR: Estimated Creatinine Clearance: 57.3 mL/min (by C-G formula based on SCr of 0.88 mg/dL). Liver Function Tests: Recent Labs  Lab 07/20/20 1213  AST 19  ALT 12  ALKPHOS 51  BILITOT 2.5*  PROT 6.8  ALBUMIN 3.9   No results for input(s): LIPASE, AMYLASE in the last 168 hours. Recent  Labs  Lab 07/20/20 1214  AMMONIA 17   Coagulation Profile: No results for input(s): INR, PROTIME in the last 168 hours. Cardiac Enzymes: No results for input(s): CKTOTAL, CKMB, CKMBINDEX, TROPONINI in the last 168 hours. BNP (last 3 results) No results for input(s): PROBNP in the last 8760 hours. HbA1C: No results for input(s): HGBA1C in the last 72 hours. CBG: Recent Labs  Lab 07/23/20 0041 07/23/20 0357 07/23/20 0737 07/23/20 1157 07/23/20 1608  GLUCAP 315* 187* 132* 186* 311*   Lipid Profile: No results for input(s): CHOL, HDL, LDLCALC, TRIG, CHOLHDL, LDLDIRECT in the last 72 hours. Thyroid Function Tests: No results for input(s): TSH, T4TOTAL, FREET4, T3FREE, THYROIDAB in the last 72 hours. Anemia Panel: No results for input(s): VITAMINB12, FOLATE, FERRITIN, TIBC, IRON, RETICCTPCT in the last 72 hours. Sepsis Labs: No results for input(s): PROCALCITON, LATICACIDVEN in the last 168 hours.  Recent Results (from the past 240 hour(s))  Resp Panel by RT-PCR (Flu A&B, Covid) Nasopharyngeal Swab     Status: None   Collection Time: 07/20/20  1:43 PM   Specimen: Nasopharyngeal Swab; Nasopharyngeal(NP) swabs in vial transport medium  Result Value Ref Range Status   SARS Coronavirus 2 by RT PCR NEGATIVE NEGATIVE Final    Comment: (NOTE) SARS-CoV-2 target nucleic acids are NOT DETECTED.  The SARS-CoV-2 RNA is generally detectable in upper respiratory specimens during the acute phase of infection. The lowest concentration of SARS-CoV-2 viral copies this assay can detect is 138 copies/mL. A negative result does not preclude SARS-Cov-2 infection and should not be used as the sole basis for treatment or other patient management decisions. A negative result may occur with  improper specimen collection/handling, submission of specimen other than nasopharyngeal swab, presence of viral mutation(s) within the areas targeted by this assay, and inadequate number of viral copies(<138  copies/mL). A negative result must be combined with clinical observations, patient history, and epidemiological information. The expected result is Negative.  Fact Sheet for Patients:  EntrepreneurPulse.com.au  Fact Sheet for Healthcare Providers:  IncredibleEmployment.be  This test is no t yet approved or cleared by the [place] FDA and  has been authorized for detection and/or diagnosis of SARS-CoV-2 by FDA under an Emergency Use Authorization (EUA). This EUA will remain  in effect (meaning this test can be used) for the duration of the COVID-19 declaration under Section 564(b)(1) of the Act, 21 U.S.C.section 360bbb-3(b)(1), unless the authorization is terminated  or revoked sooner.       Influenza A by PCR NEGATIVE NEGATIVE Final   Influenza B by PCR NEGATIVE NEGATIVE Final    Comment: (NOTE) The Xpert Xpress SARS-CoV-2/FLU/RSV plus assay is intended as  an aid in the diagnosis of influenza from Nasopharyngeal swab specimens and should not be used as a sole basis for treatment. Nasal washings and aspirates are unacceptable for Xpert Xpress SARS-CoV-2/FLU/RSV testing.  Fact Sheet for Patients: EntrepreneurPulse.com.au  Fact Sheet for Healthcare Providers: IncredibleEmployment.be  This test is not yet approved or cleared by the [place] FDA and has been authorized for detection and/or diagnosis of SARS-CoV-2 by FDA under an Emergency Use Authorization (EUA). This EUA will remain in effect (meaning this test can be used) for the duration of the COVID-19 declaration under Section 564(b)(1) of the Act, 21 U.S.C. section 360bbb-3(b)(1), unless the authorization is terminated or revoked.  Performed at [facility], [address] [place], [place] [address]       Radiology Studies: No results found.   Scheduled Meds: . allopurinol  300 mg Oral Daily  . aspirin EC  81 mg Oral  Daily  . atorvastatin  80 mg Oral Daily  . bisoprolol  5 mg Oral Daily  . darifenacin  7.5 mg Oral Daily  . enoxaparin (LOVENOX) injection  40 mg Subcutaneous Q24H  . fluticasone furoate-vilanterol  1 puff Inhalation Daily   And  . umeclidinium bromide  1 puff Inhalation Daily  . insulin aspart  0-15 Units Subcutaneous TID WC  . insulin aspart  4 Units Subcutaneous TID WC  . insulin detemir  0.3 Units/kg Subcutaneous Q24H  . Ipratropium-Albuterol  1 puff Inhalation Q6H  . loratadine  10 mg Oral Daily  . mirabegron ER  25 mg Oral Daily  . montelukast  10 mg Oral QHS  . multivitamin with minerals  1 tablet Oral Daily  . omega-3 acid ethyl esters  1 g Oral Daily  . vitamin B-12  1,000 mcg Oral Daily   Continuous Infusions: . sodium chloride    . lactated ringers       LOS: 3 days     [name], MD [facility] If 7PM-7AM, please contact night-coverage 07/23/2020, 6:11 PM

## 2020-07-23 NOTE — Progress Notes (Signed)
Patient educated on insulin administration.  He was able to give himself his evening insulin dose with max cuing and assistance.  F/u education is needed for independence.  MD and diabetes educator notified.

## 2020-07-23 NOTE — Progress Notes (Signed)
Spoke with patient via (DM coordinator working remotely @ [facility]) phone regarding insulin administration. Patient stated "I didn't know I needed to learn insulin administration." DM coordinator spoke with patient's cousin this am and cousin stated she is willing to learn to draw up insulin in syringes and do supply for a week @ a time, but does not want to commit to visiting patient every day to assist with insulin.  Explained to pt. That he himself will need to do his own injections and pt. Stated that "I am willing". Discussed with RN [name] who states she spent approximately 2 hrs. With patient and his cousin without success.  Thank you, [name]. Sayla Golonka, RN, MSN, CDE  Diabetes Coordinator Inpatient Glycemic Control Team Team Pager [phone] (8am-5pm) 07/23/2020 2:40 PM

## 2020-07-23 NOTE — Care Management Important Message (Signed)
Important Message  Patient Details  Name: Allen Hunter. MRN: [id number] Date of Birth: January 23, 1941   Medicare Important Message Given:  Yes     Juliann Pulse A Ohn Bostic 07/23/2020, 10:58 AM

## 2020-07-23 NOTE — Telephone Encounter (Signed)
Allen Hunter from [place] has called to let us know that the patient has requested to start the physical and occupational therapy on Tuesday, May 10th  [facility]- [phone]

## 2020-07-23 NOTE — TOC Transition Note (Signed)
Transition of Care [facility]) - CM/SW Discharge Note   Patient Details  Name: Allen Hunter. MRN: [id number] Date of Birth: 03-Jan-1941  [facility] CM/SW Contact:  Shelbie Hutching, RN Phone Number: 07/23/2020, 12:52 PM   Clinical Narrative:    Patient is medically cleared for discharge home with home health services.  RNCM met with patient and patient's cousin, Mae at the bedside.  Mae will be driving the patient home today at discharge and she will be helping him at home to manage his insulin.  Diabetes coordinator and bedside RN have both done teaching with the patient and Mae.  Home health services have been arranged with [facility] for RN, PT, OT, and aide.  [place] with [facility] is aware of discharge today.   Rolling walker ordered from [employer] and will be delivered to the bedside today before discharge.     Final next level of care: [place] Barriers to Discharge: Barriers Resolved   Patient Goals and CMS Choice Patient states their goals for this hospitalization and ongoing recovery are:: wants to go home with home health services CMS Medicare.gov Compare Post Acute Care list provided to:: Patient Choice offered to / list presented to : Patient  Discharge Placement                       Discharge Plan and Services                DME Arranged: Walker rolling DME Agency: [employer] Date DME Agency Contacted: 07/23/20 Time DME Agency Contacted: 1250 Representative spoke with at DME Agency: [place]: RN,PT,OT,Nurse's Center: [facility] Date [place]: 07/23/20 Time [place]: 1250 Representative spoke with at [place]: [place] ([place]) Interventions     Readmission Risk Interventions No flowsheet data found.

## 2020-07-23 NOTE — Progress Notes (Signed)
Patient's cousin again educated on drawing up insulin into they syringe.  She was able to draw up sterile water into insulin 5 syringes for practice with moderate cuing.  Will need f/u education.  Patient states that he will give his insulin injection once his blood sugar is checked.

## 2020-07-23 NOTE — Evaluation (Signed)
Physical Therapy Evaluation Patient Details Name: Allen Hunter. MRN: [id number] DOB: [birth year] Today's Date: 07/23/2020   History of Present Illness  presented to ER secondary to AMS; admitted for management of DKA  Clinical Impression  Patient resting in bed upon arrival to room; CNA at bedside for vitals assessment. Patient alert and oriented to basic information when interviewed; however, generalized insight/awareness, ability to recall/integrate new information and overall safety awareness moderately impaired.  Bilat UE/LE strength and ROM grossly symmetrical and WFL; no focal weakness appreciated.  Able to complete bed mobility with min assist; sit/stand, basic transfers and gait (100') without assist device, cga/min assist.  Demonstrates broad BOS with very staggered stepping performance, poor heel strike/toe off (R > L), poor balance reactions.  Excessive forward trunk lean/anterior weight shift at times, min assist to correct and prevent anterior LOB.  Additional trial completed with use of RW (200', cga/close sup) noting   improved foot placement, stepping pattern and overall gait symmetry; improved fluidity and overall safety.  Do recommend continued use of RW for all gait/transfers at this time; patietn voiced understanding. Would benefit from skilled PT to address above deficits and promote optimal return to PLOF.; Recommend transition to HHPT upon discharge from acute hospitalization.    Follow Up Recommendations Home health PT;Supervision - Intermittent    Equipment Recommendations  Rolling walker with 5" wheels    Recommendations for Other Services       Precautions / Restrictions Precautions Precautions: Fall Restrictions Weight Bearing Restrictions: No      Mobility  Bed Mobility Overal bed mobility: Modified Independent                  Transfers Overall transfer level: Needs assistance Equipment used: Rolling walker (2 wheeled) Transfers: Sit to/from  Stand Sit to Stand: Min guard            Ambulation/Gait Ambulation/Gait assistance: Min guard;Min assist Gait Distance (Feet): 100 Feet Assistive device: None       General Gait Details: broad BOS with very staggered stepping performance, poor heel strike/toe off (R > L), poor balance reactions.  Excessive forward trunk lean/anterior weight shift at times, min assist to correct and prevent anterior LOB  Stairs            Wheelchair Mobility    Modified Rankin (Stroke Patients Only)       Balance Overall balance assessment: Needs assistance Sitting-balance support: No upper extremity supported;Feet supported Sitting balance-Leahy Scale: Good     Standing balance support: Bilateral upper extremity supported Standing balance-Leahy Scale: Fair                               Pertinent Vitals/Pain Pain Assessment: No/denies pain    Home Living Family/patient expects to be discharged to:: Private residence Living Arrangements: Alone Available Help at Discharge: Available PRN/intermittently   Home Access: Stairs to enter Entrance Stairs-Rails: Left Entrance Stairs-Number of Steps: 3 Home Layout: One level Home Equipment: None      Prior Function Level of Independence: Independent         Comments: Indep with ADLs, household and community mobilization; + driving; denies fall history.     Hand Dominance        Extremity/Trunk Assessment   Upper Extremity Assessment Upper Extremity Assessment: Overall WFL for tasks assessed    Lower Extremity Assessment Lower Extremity Assessment: Overall WFL for tasks assessed (grossly 4+/5 throughout)  Communication   Communication: No difficulties  Cognition Arousal/Alertness: Awake/alert Behavior During Therapy: WFL for tasks assessed/performed Overall Cognitive Status: Within Functional Limits for tasks assessed                                 General Comments: alert and  oriented to basic information; however, poor recall of new information (states place as hospital initially, but during gait trial asking 'what is this place?'), poor insight and overall safety awareness.  Was able to verbally problem-solve emergency situations and identify need/process for calling 911      General Comments      Exercises Other Exercises Other Exercises: 200' with RW, cga/close sup-improved foot placement, stepping pattern and overall gait symmetry; improved fluidity and overall safety.  Do recommend continued use of RW for all gait/transfers at this time; patietn voiced understanding. Other Exercises: 10' walk time with RW, 12 seconds-indicative of household ambulator   Assessment/Plan    PT Assessment Patient needs continued PT services  PT Problem List Decreased strength;Decreased activity tolerance;Decreased balance;Decreased mobility;Decreased coordination;Decreased cognition;Decreased knowledge of use of DME;Decreased safety awareness;Decreased knowledge of precautions;Cardiopulmonary status limiting activity       PT Treatment Interventions DME instruction;Gait training;Functional mobility training;Stair training;Therapeutic activities;Therapeutic exercise;Balance training;Patient/family education;Cognitive remediation    PT Goals (Current goals can be found in the Care Plan section)  Acute Rehab PT Goals Patient Stated Goal: to return home PT Goal Formulation: With patient Time For Goal Achievement: 08/06/20 Potential to Achieve Goals: Good    Frequency Min 2X/week   Barriers to discharge        Co-evaluation               AM-PAC PT "6 Clicks" Mobility  Outcome Measure Help needed turning from your back to your side while in a flat bed without using bedrails?: None Help needed moving from lying on your back to sitting on the side of a flat bed without using bedrails?: A Little Help needed moving to and from a bed to a chair (including a  wheelchair)?: A Little Help needed standing up from a chair using your arms (e.g., wheelchair or bedside chair)?: A Little Help needed to walk in hospital room?: A Little Help needed climbing 3-5 steps with a railing? : A Little 6 Click Score: 19    End of Session Equipment Utilized During Treatment: Gait belt Activity Tolerance: Patient tolerated treatment well Patient left: in chair;with call bell/phone within reach;with chair alarm set Nurse Communication: Mobility status PT Visit Diagnosis: Muscle weakness (generalized) (M62.81);Difficulty in walking, not elsewhere classified (R26.2)    Time: 3614-4315 PT Time Calculation (min) (ACUTE ONLY): 32 min   Charges:   PT Evaluation $PT Eval Moderate Complexity: 1 Mod PT Treatments $Gait Training: 8-22 mins   Yesli Vanderhoff H. Owens Shark, PT, DPT, NCS 07/23/20, 3:31 PM [phone]

## 2020-07-23 NOTE — Progress Notes (Signed)
Inpatient Diabetes Program Recommendations  AACE/ADA: New Consensus Statement on Inpatient Glycemic Control (2015)  Target Ranges:  Prepandial:   less than 140 mg/dL      Peak postprandial:   less than 180 mg/dL (1-2 hours)      Critically ill patients:  140 - 180 mg/dL   Lab Results  Component Value Date   GLUCAP 132 (H) 07/23/2020   HGBA1C 13.4 (H) 07/20/2020    Review of Glycemic Control  Diabetes history:DM2 Outpatient Diabetes medications:Trulicity (not taking) + Jardiance 25 + Glucotrol 10 mg qd + Metformin 500 mg bid  Current orders for Inpatient glycemic control:Levemir 22 units + Novolog 0-15 units tid correction q 4 hrs.  Inpatient Diabetes Program Recommendations:   Spoke with patient @ bedside 07/22/20 @ length and patient states that he thinks his niece will be able to assist him with insulin administration. RN Gerald Stabs was to meet with patient and niece yesterday evening.  Just spoke with cousin Clara Francella Solian. Iona Beard regarding assistance with patient's care @ discharge. Cousin states "I don't have the heart to give shots". Cousin states willingness to fill syringes for patient to use several days @ a time and have available for patient.  Needs on discharge: Novolog 70/30 vial [id number]    Syringes [id number] Meter kits [id number]  Spoke with RN Montey Hora regarding review with cousin when she arrives to the hospital how to draw up insulin in a syringe and hypoglycemia protocol. Also plans to evaluate how well patient gives his own insulin due @ noon.  Discussed with Dr. Billie Ruddy per phone call and chat.  Thank you, [name]. Stefana Lodico, RN, MSN, CDE  Diabetes Coordinator Inpatient Glycemic Control Team Team Pager [phone] (8am-5pm) 07/23/2020 11:28 AM

## 2020-07-23 NOTE — Progress Notes (Signed)
Patient and his cousin educated about drawing up insulin and administration.  Cousin, May, and patient have multiple questions about who will give patient his insulin twice daily and what the medication packaging looks like coming from the pharmacy.  I attempted to educate them about how to draw up the insulin.  May made several attempts at using the needle to draw up sterile water and was successful after a few attempts.  She states that she is not comfortable with the syringe.  Offered to let patient self administer his insulin and he states that he will not be giving himself the shots.  F/u education is needed once someone is identified to administer patient his insulin.  MD and diabetes educator notified.

## 2020-07-24 LAB — BASIC METABOLIC PANEL
Anion gap: 6 (ref 5–15)
BUN: 33 mg/dL — ABNORMAL HIGH (ref 8–23)
CO2: 28 mmol/L (ref 22–32)
Calcium: 9 mg/dL (ref 8.9–10.3)
Chloride: 105 mmol/L (ref 98–111)
Creatinine, Ser: 1.05 mg/dL (ref 0.61–1.24)
GFR, Estimated: >60 mL/min (ref >60)
Glucose, Bld: 284 mg/dL — ABNORMAL HIGH (ref 70–99)
Potassium: 4.5 mmol/L (ref 3.5–5.1)
Sodium: 139 mmol/L (ref 135–145)

## 2020-07-24 LAB — GLUCOSE, CAPILLARY
Glucose-Capillary: 180 mg/dL — ABNORMAL HIGH (ref 70–99)
Glucose-Capillary: 174 mg/dL — ABNORMAL HIGH (ref 70–99)
Glucose-Capillary: 163 mg/dL — ABNORMAL HIGH (ref 70–99)
Glucose-Capillary: 309 mg/dL — ABNORMAL HIGH (ref 70–99)

## 2020-07-24 LAB — CBC
HCT: 40.4 % (ref 39.0–52.0)
Hemoglobin: 13.3 g/dL (ref 13.0–17.0)
MCH: 30 pg (ref 26.0–34.0)
MCHC: 32.9 g/dL (ref 30.0–36.0)
MCV: 91.2 fL (ref 80.0–100.0)
Platelets: 143 10*3/uL — ABNORMAL LOW (ref 150–400)
RBC: 4.43 MIL/uL (ref 4.22–5.81)
RDW: 15.2 % (ref 11.5–15.5)
WBC: 3.9 10*3/uL — ABNORMAL LOW (ref 4.0–10.5)
nRBC: 0 % (ref 0.0–0.2)

## 2020-07-24 LAB — MAGNESIUM: Magnesium: 1.9 mg/dL (ref 1.7–2.4)

## 2020-07-24 MED ORDER — POLYETHYLENE GLYCOL 3350 17 G PO PACK
17.0000 g | PACK | Freq: Every day | ORAL | Status: DC | PRN
  Administered 2020-07-28: 17 g via ORAL
  Filled 2020-07-24: qty 1

## 2020-07-24 NOTE — Progress Notes (Addendum)
Inpatient Diabetes Program Recommendations  AACE/ADA: New Consensus Statement on Inpatient Glycemic Control (2015)  Target Ranges:  Prepandial:   less than 140 mg/dL      Peak postprandial:   less than 180 mg/dL (1-2 hours)      Critically ill patients:  140 - 180 mg/dL   Results for Allen Hunter, Allen Hunter (MRN [id number]) as of 07/24/2020 12:28  Ref. Range 07/23/2020 07:37 07/23/2020 11:57 07/23/2020 16:08 07/23/2020 21:29  Glucose-Capillary Latest Ref Range: 70 - 99 mg/dL 132 (H) 186 (H) 311 (H) 382 (H)   Results for Allen Hunter, Allen Hunter (MRN [id number]) as of 07/24/2020 12:28  Ref. Range 07/24/2020 08:53 07/24/2020 11:40  Glucose-Capillary Latest Ref Range: 70 - 99 mg/dL 180 (H) 174 (H)   Diabetes history:DM2  Home DM Meds: Trulicity (not taking)        Jardiance 25 mg Daily       Glucotrol 10 mg Daily       Metformin 500 mg bid  Current Orders: Levemir 22 units Daily      Novolog Moderate Correction Scale/ SSI (0-15 units) TID AC       Novolog 4 units TID with meals    Met w/ pt and pt's cousin at bedside today.  I turned the TV off and put all the lights on so I could get [name]' full attention.  Cousin at bedside had many good questions which I answered and was very involved in care today.  Discussed with pt and cousin all steps of preparing and giving insulin injections (prep of site including washing hands, selection of site, pinching skin, injecting at angle, depressing the plunger all the way, removal of needle from skin, disposal of syringe, rotation of sites, storage of insulin).  Pt was able to prep his skin, choose a site, and give his injection with moderate amount of assistance from me while cousin watched.  I had to remind pt to look at his abdomen while injecting and not to look away.  I also was able to assist pt's cousin in correctly drawing up a dose of insulin from a vial of saline.  Cousin and I discussed labeling each syringe with day and night dose and only placing 2 syringes in  each ziploc bag in order to avoid confusion for the pt.  Cousin also asked me if [facility] or [place] aide could come to pt's house for 1 week to assist with insulin while all this is new.  I sent [place] to the medical team alerting them to this.  Pt was able to give himself 7 unit dose of Novolog at 12:10pm (injected into R lower abdomen).  I am concerned that pt may not be able to correctly/safely give insulin at home without more practice.    --Will follow patient during hospitalization--  [name] RN, MSN, CDE Diabetes Coordinator Inpatient Glycemic Control Team Team Pager: [phone] (8a-5p)

## 2020-07-24 NOTE — Progress Notes (Signed)
Physical Therapy Treatment Patient Details Name: Allen Hunter. MRN: [id number] DOB: [birth year] Today's Date: 07/24/2020    History of Present Illness presented to ER secondary to AMS; admitted for management of DKA    PT Comments    Improved endurance and functional activity tolerance this date; completing all transfers and gait (>200') without assist device, sup.  Continues with mild sway during dynamic gait components, but able to self-correct without formal LOB.   Did complete ambulatory scavenger hunt throughout the unit, sup for mobility component;  Max cuing for problem-solving unfamiliar activities, max cuing for way-finding and personnel identification; min/mod cuing for STM/recall of new information.  Educated in strategies to facilitate safe and effective completion of activities; will continue to reinforce throughout stay. Recommend consistent supervision for ADLs, IADLs, mobility with patient upon discharge given noted cognitive deficits     Follow Up Recommendations  Home health PT;Supervision - Intermittent     Equipment Recommendations  Rolling walker with 5" wheels    Recommendations for Other Services       Precautions / Restrictions Precautions Precautions: Fall Restrictions Weight Bearing Restrictions: No    Mobility  Bed Mobility               General bed mobility comments: seated edge of bed end of session    Transfers Overall transfer level: Needs assistance Equipment used: None Transfers: Sit to/from Stand Sit to Stand: Supervision            Ambulation/Gait Ambulation/Gait assistance: Supervision Gait Distance (Feet):  (200' x1, 300' x1) Assistive device: None       General Gait Details: improved postural extension and dynamic balance reactions noted; mild sway with head turns, but self-corrects LOB as needed.   Stairs Stairs: Yes Stairs assistance: Min guard Stair Management: One rail Right Number of Stairs: 6 General  stair comments: mixed step to and step through gait pattern; encouraged for step to for optimal safety   Wheelchair Mobility    Modified Rankin (Stroke Patients Only)       Balance Overall balance assessment: Needs assistance Sitting-balance support: No upper extremity supported;Feet supported Sitting balance-Leahy Scale: Good     Standing balance support: No upper extremity supported Standing balance-Leahy Scale: Fair                              Cognition Arousal/Alertness: Awake/alert Behavior During Therapy: WFL for tasks assessed/performed                                   General Comments: follows commands; poor STM and recall of new information; max prompting/cues for problem-solving and sequencing in unfamiliar/non-automatic situations      Exercises Other Exercises Other Exercises: Ambulatory scavenger hunt throughout the unit (>300' without assist device), supervision for mobility. Max cuing for problem-solving unfamiliar activities, max cuing for way-finding and personnel identification; min/mod cuing for STM/recall of new information.  Educated in strategies to facilitate safe and effective completion of activities; will continue to reinforce throughout stay.    General Comments        Pertinent Vitals/Pain Pain Assessment: No/denies pain    Home Living                      Prior Function            PT Goals (current  goals can now be found in the care plan section) Acute Rehab PT Goals Patient Stated Goal: to return home PT Goal Formulation: With patient Time For Goal Achievement: 08/06/20 Potential to Achieve Goals: Good Progress towards PT goals: Progressing toward goals    Frequency    Min 2X/week      PT Plan Current plan remains appropriate    Co-evaluation              AM-PAC PT "6 Clicks" Mobility   Outcome Measure  Help needed turning from your back to your side while in a flat bed  without using bedrails?: None Help needed moving from lying on your back to sitting on the side of a flat bed without using bedrails?: None Help needed moving to and from a bed to a chair (including a wheelchair)?: None Help needed standing up from a chair using your arms (e.g., wheelchair or bedside chair)?: None Help needed to walk in hospital room?: None Help needed climbing 3-5 steps with a railing? : A Little 6 Click Score: 23    End of Session Equipment Utilized During Treatment: Gait belt Activity Tolerance: Patient tolerated treatment well Patient left: in bed;with call bell/phone within reach;with family/visitor present (per RN, okay for no alarm end of session (family present)) Nurse Communication: Mobility status PT Visit Diagnosis: Muscle weakness (generalized) (M62.81);Difficulty in walking, not elsewhere classified (R26.2)     Time: 1308-6578 PT Time Calculation (min) (ACUTE ONLY): 28 min  Charges:  $Gait Training: 8-22 mins $Therapeutic Activity: 8-22 mins                     Rylin Seavey H. Owens Shark, PT, DPT, NCS 07/24/20, 3:50 PM [phone]

## 2020-07-24 NOTE — Progress Notes (Signed)
Assuming care of pt at 2330. Pt is aox3, forgetful at times. VSS, denies any pain. Meds given. Call bell placed within reach. Will monitor.

## 2020-07-24 NOTE — Progress Notes (Signed)
PROGRESS NOTE    Allen Hunter.  OAC:[id number] DOB: December 05, 1940 DOA: 07/20/2020 PCP: [name], MD   Chief Complaint  Patient presents with  . Altered Mental Status  Brief Narrative: 80 year old male with history of diabetes with stage III CKD, prostate cancer, dyslipidemia brought to the ED by EMS due to altered mental status.  Patient was seen in the ED found to have diabetic ketoacidosis acute metabolic encephalopathy pseudohyponatremia and was admitted for further management. Patient was optimized and subsequently transferred from insulin drip to subcu insulin Nursing and diabetic coordinator has been working diligently for insulin education teaching.  Subjective: Seen this morning alert awake.  He reports he is agreeable for trying some insulin at home.  He reports he did talk with nursing and he is still not comfortable doing injection but willing to try and work   Assessment & Plan:  DKA POA.  Resolved.    Type 2 diabetes mellitus with uncontrolled hyperglycemia, hemoglobin A1c 13: Continue on Lantus 20 units daily and special-needs 3 times daily.  Plan was for him to discharge on 70/30 16 u BID-Nursing staff DM coordinator has been working on education and insulin teaching and injection.  It seems he will likely need more practice.  His cousin is helping him out.  We will make sure patient is able to do injection and is comfortable prior to discharge otherwise it will not be a safe discharge Recent Labs  Lab 07/23/20 1157 07/23/20 1608 07/23/20 2129 07/24/20 0853 07/24/20 1140  GLUCAP 186* 311* 382* 180* 010*   Acute metabolic encephalopathy in the setting of DKA.  Currently mental status stable at times he seems confused but seems to be doing well.  COPD: Stable not in exacerbation.  Continue bronchodilators  Hyponatremia: Pseudohyponatremia.  Stable  AKI/dehydration: Due to uncontrolled hyperglycemia.  Improved.  Encourage oral hydration.  Diet Order             Diet Carb Modified Fluid consistency: Thin; Room service appropriate? Yes  Diet effective now                 DVT prophylaxis: enoxaparin (LOVENOX) injection 40 mg Start: 07/20/20 2200 Code Status:   Code Status: Full Code  Family Communication: plan of care discussed with patient at bedside.  Status is: Inpatient  Remains inpatient appropriate because:Unsafe d/c plan and Inpatient level of care appropriate due to severity of illness  Dispo: The patient is from: Home              Anticipated d/c is to: Home              Patient currently is not medically stable to d/c.   Difficult to place patient No       Unresulted Labs (From admission, onward)          Start     Ordered   07/22/20 0500  CBC  Daily,   R      07/21/20 1709   07/22/20 0500  Magnesium  Daily,   R      07/21/20 1709   07/21/20 9323  Basic metabolic panel  Daily,   STAT      07/20/20 2358         Medications reviewed:  Scheduled Meds: . allopurinol  300 mg Oral Daily  . aspirin EC  81 mg Oral Daily  . atorvastatin  80 mg Oral Daily  . bisoprolol  5 mg Oral Daily  . darifenacin  7.5 mg Oral Daily  . enoxaparin (LOVENOX) injection  40 mg Subcutaneous Q24H  . fluticasone furoate-vilanterol  1 puff Inhalation Daily   And  . umeclidinium bromide  1 puff Inhalation Daily  . insulin aspart  0-15 Units Subcutaneous TID WC  . insulin aspart  4 Units Subcutaneous TID WC  . insulin detemir  0.3 Units/kg Subcutaneous Q24H  . Ipratropium-Albuterol  1 puff Inhalation Q6H  . loratadine  10 mg Oral Daily  . mirabegron ER  25 mg Oral Daily  . montelukast  10 mg Oral QHS  . multivitamin with minerals  1 tablet Oral Daily  . omega-3 acid ethyl esters  1 g Oral Daily  . vitamin B-12  1,000 mcg Oral Daily   Continuous Infusions: . sodium chloride    . lactated ringers      Consultants:see note  Procedures:see note  Antimicrobials: Anti-infectives (From admission, onward)   None      Culture/Microbiology No results found for: SDES, SPECREQUEST, CULT, REPTSTATUS  Other culture-see note  Objective: Vitals: Today's Vitals   07/24/20 0354 07/24/20 0743 07/24/20 0837 07/24/20 1141  BP: 114/72 (!) 86/58 91/62 96/63   Pulse: 75 70  71  Resp: 18 17  16   Temp: 97.7 F (36.5 C) 97.8 F (36.6 C)  97.9 F (36.6 C)  TempSrc:      SpO2: 99% 97%  94%  Weight:      Height:      PainSc: 0-No pain 0-No pain      Intake/Output Summary (Last 24 hours) at 07/24/2020 1456 Last data filed at 07/24/2020 1348 Gross per 24 hour  Intake 360 ml  Output 200 ml  Net 160 ml   Filed Weights   07/20/20 1208 07/21/20 0340  Weight: 74.8 kg 73.5 kg   Weight change:   Intake/Output from previous day: 05/03 0701 - 05/04 0700 In: 600 [P.O.:600] Out: 200 [Urine:200] Intake/Output this shift: Total I/O In: 240 [P.O.:240] Out: -  Filed Weights   07/20/20 1208 07/21/20 0340  Weight: 74.8 kg 73.5 kg    Examination: General exam: AAO at baseline, older than stated age, weak appearing. HEENT:Oral mucosa moist, Ear/Nose WNL grossly,dentition normal. Respiratory system: bilaterally diminished, bilaterally clear, no use of accessory muscle, non tender. Cardiovascular system: S1 & S2 +, regular rate and rhythm no JVD. Gastrointestinal system: Abdomen soft, NT,ND, BS+. Nervous System:Alert, awake, moving extremities Extremities: No edema, distal peripheral pulses palpable.  Skin: No rashes,no icterus. MSK: Normal muscle bulk,tone, power  Data Reviewed: I have personally reviewed following labs and imaging studies CBC: Recent Labs  Lab 07/20/20 1213 07/22/20 0504 07/23/20 0433 07/24/20 0406  WBC 3.4* 3.7* 3.4* 3.9*  NEUTROABS 2.2  --   --   --   HGB 15.6 13.4 13.1 13.3  HCT 49.4 40.4 39.8 40.4  MCV 94.8 91.6 91.7 91.2  PLT 191 156 140* [id number]*   Basic Metabolic Panel: Recent Labs  Lab 07/20/20 1213 07/20/20 1739 07/20/20 2201 07/21/20 0448 07/22/20 0504 07/23/20 0433  07/24/20 0406  NA 133*   < > 135 137 141 141 139  K 4.9   < > 4.1 4.0 3.8 3.7 4.5  CL 99   < > 105 105 108 107 105  CO2 11*   < > 20* 21* 28 28 28   GLUCOSE 302*   < > 252* 151* 100* 175* 284*  BUN 42*   < > 39* 34* 33* 25* 33*  CREATININE 1.77*   < > 1.22 1.18  1.19 0.88 1.05  CALCIUM 9.2   < > 8.8* 8.8* 9.1 9.0 9.0  MG 2.1  --   --   --  2.1 1.9 1.9   < > = values in this interval not displayed.   GFR: Estimated Creatinine Clearance: 48 mL/min (by C-G formula based on SCr of 1.05 mg/dL). Liver Function Tests: Recent Labs  Lab 07/20/20 1213  AST 19  ALT 12  ALKPHOS 51  BILITOT 2.5*  PROT 6.8  ALBUMIN 3.9   No results for input(s): LIPASE, AMYLASE in the last 168 hours. Recent Labs  Lab 07/20/20 1214  AMMONIA 17   Coagulation Profile: No results for input(s): INR, PROTIME in the last 168 hours. Cardiac Enzymes: No results for input(s): CKTOTAL, CKMB, CKMBINDEX, TROPONINI in the last 168 hours. BNP (last 3 results) No results for input(s): PROBNP in the last 8760 hours. HbA1C: No results for input(s): HGBA1C in the last 72 hours. CBG: Recent Labs  Lab 07/23/20 1157 07/23/20 1608 07/23/20 2129 07/24/20 0853 07/24/20 1140  GLUCAP 186* 311* 382* 180* 174*   Lipid Profile: No results for input(s): CHOL, HDL, LDLCALC, TRIG, CHOLHDL, LDLDIRECT in the last 72 hours. Thyroid Function Tests: No results for input(s): TSH, T4TOTAL, FREET4, T3FREE, THYROIDAB in the last 72 hours. Anemia Panel: No results for input(s): VITAMINB12, FOLATE, FERRITIN, TIBC, IRON, RETICCTPCT in the last 72 hours. Sepsis Labs: No results for input(s): PROCALCITON, LATICACIDVEN in the last 168 hours.  Recent Results (from the past 240 hour(s))  Resp Panel by RT-PCR (Flu A&B, Covid) Nasopharyngeal Swab     Status: None   Collection Time: 07/20/20  1:43 PM   Specimen: Nasopharyngeal Swab; Nasopharyngeal(NP) swabs in vial transport medium  Result Value Ref Range Status   SARS Coronavirus 2 by  RT PCR NEGATIVE NEGATIVE Final    Comment: (NOTE) SARS-CoV-2 target nucleic acids are NOT DETECTED.  The SARS-CoV-2 RNA is generally detectable in upper respiratory specimens during the acute phase of infection. The lowest concentration of SARS-CoV-2 viral copies this assay can detect is 138 copies/mL. A negative result does not preclude SARS-Cov-2 infection and should not be used as the sole basis for treatment or other patient management decisions. A negative result may occur with  improper specimen collection/handling, submission of specimen other than nasopharyngeal swab, presence of viral mutation(s) within the areas targeted by this assay, and inadequate number of viral copies(<138 copies/mL). A negative result must be combined with clinical observations, patient history, and epidemiological information. The expected result is Negative.  Fact Sheet for Patients:  EntrepreneurPulse.com.au  Fact Sheet for Healthcare Providers:  IncredibleEmployment.be  This test is no t yet approved or cleared by the [place] FDA and  has been authorized for detection and/or diagnosis of SARS-CoV-2 by FDA under an Emergency Use Authorization (EUA). This EUA will remain  in effect (meaning this test can be used) for the duration of the COVID-19 declaration under Section 564(b)(1) of the Act, 21 U.S.C.section 360bbb-3(b)(1), unless the authorization is terminated  or revoked sooner.       Influenza A by PCR NEGATIVE NEGATIVE Final   Influenza B by PCR NEGATIVE NEGATIVE Final    Comment: (NOTE) The Xpert Xpress SARS-CoV-2/FLU/RSV plus assay is intended as an aid in the diagnosis of influenza from Nasopharyngeal swab specimens and should not be used as a sole basis for treatment. Nasal washings and aspirates are unacceptable for Xpert Xpress SARS-CoV-2/FLU/RSV testing.  Fact Sheet for Patients: EntrepreneurPulse.com.au  Fact Sheet  for Healthcare  Providers: IncredibleEmployment.be  This test is not yet approved or cleared by the [place] and has been authorized for detection and/or diagnosis of SARS-CoV-2 by FDA under an Emergency Use Authorization (EUA). This EUA will remain in effect (meaning this test can be used) for the duration of the COVID-19 declaration under Section 564(b)(1) of the Act, 21 U.S.C. section 360bbb-3(b)(1), unless the authorization is terminated or revoked.  Performed at [facility], [address], [place], [place] [address]      Radiology Studies: No results found.   LOS: 4 days   Antonieta Pert, MD [facility]  07/24/2020, 2:56 PM

## 2020-07-25 LAB — BASIC METABOLIC PANEL
Anion gap: 6 (ref 5–15)
BUN: 29 mg/dL — ABNORMAL HIGH (ref 8–23)
CO2: 29 mmol/L (ref 22–32)
Calcium: 9 mg/dL (ref 8.9–10.3)
Chloride: 106 mmol/L (ref 98–111)
Creatinine, Ser: 1.08 mg/dL (ref 0.61–1.24)
GFR, Estimated: >60 mL/min (ref >60)
Glucose, Bld: 181 mg/dL — ABNORMAL HIGH (ref 70–99)
Potassium: 4.1 mmol/L (ref 3.5–5.1)
Sodium: 141 mmol/L (ref 135–145)

## 2020-07-25 LAB — MAGNESIUM: Magnesium: 1.9 mg/dL (ref 1.7–2.4)

## 2020-07-25 LAB — CBC
HCT: 39.5 % (ref 39.0–52.0)
Hemoglobin: 13.1 g/dL (ref 13.0–17.0)
MCH: 30.3 pg (ref 26.0–34.0)
MCHC: 33.2 g/dL (ref 30.0–36.0)
MCV: 91.2 fL (ref 80.0–100.0)
Platelets: 175 10*3/uL (ref 150–400)
RBC: 4.33 MIL/uL (ref 4.22–5.81)
RDW: 15.4 % (ref 11.5–15.5)
WBC: 4.7 10*3/uL (ref 4.0–10.5)
nRBC: 0 % (ref 0.0–0.2)

## 2020-07-25 LAB — GLUCOSE, CAPILLARY
Glucose-Capillary: 129 mg/dL — ABNORMAL HIGH (ref 70–99)
Glucose-Capillary: 183 mg/dL — ABNORMAL HIGH (ref 70–99)
Glucose-Capillary: 273 mg/dL — ABNORMAL HIGH (ref 70–99)
Glucose-Capillary: 188 mg/dL — ABNORMAL HIGH (ref 70–99)

## 2020-07-25 MED ORDER — EMPAGLIFLOZIN 25 MG PO TABS
25.0000 mg | ORAL_TABLET | Freq: Every day | ORAL | Status: DC
  Administered 2020-07-25 – 2020-07-26 (×2): 25 mg via ORAL
  Filled 2020-07-25 (×2): qty 1

## 2020-07-25 MED ORDER — METFORMIN HCL 500 MG PO TABS
1000.0000 mg | ORAL_TABLET | Freq: Two times a day (BID) | ORAL | Status: DC
  Administered 2020-07-25 – 2020-07-29 (×9): 1000 mg via ORAL
  Filled 2020-07-25 (×10): qty 2

## 2020-07-25 MED ORDER — GLIPIZIDE 10 MG PO TABS
10.0000 mg | ORAL_TABLET | Freq: Two times a day (BID) | ORAL | Status: DC
  Administered 2020-07-25 – 2020-07-27 (×4): 10 mg via ORAL
  Filled 2020-07-25 (×5): qty 1

## 2020-07-25 MED ORDER — LINAGLIPTIN 5 MG PO TABS: 5.0000 mg | ORAL_TABLET | Freq: Every day | ORAL | Status: DC

## 2020-07-25 NOTE — Progress Notes (Signed)
Inpatient Diabetes Program Recommendations  AACE/ADA: New Consensus Statement on Inpatient Glycemic Control (2015)  Target Ranges:  Prepandial:   less than 140 mg/dL      Peak postprandial:   less than 180 mg/dL (1-2 hours)      Critically ill patients:  140 - 180 mg/dL   Results for Allen Hunter, Allen Hunter (MRN [id number]) as of 07/25/2020 11:53  Ref. Range 07/25/2020 08:44 07/25/2020 11:45  Glucose-Capillary Latest Ref Range: 70 - 99 mg/dL 129 (H) 183 (H)   Home DM Meds: Trulicity (not taking)                              Jardiance 25 mg Daily                             Glucotrol 10 mg Daily                             Metformin 500 mg bid  Current Orders: Levemir 22 units Daily                            Novolog Moderate Correction Scale/ SSI (0-15 units) TID AC                             Novolog 4 units TID with meals    Received Secure Chat from [name], [name] working with pt today.  Per RN, [name] "The nurses who have tried to educate him feel that him self administering insulin is not the best idea. He seems to sundown in the evenings and is confused at times. Even during the day he is confused off and on and has impaired memory. And really when he does get ahold of the syringe to inject himself, I have had to guide his hand and fingers. And the night shift nurse had to do the same. "     Not sure Insulin will be safe for pt.  I experienced the same issues when working with pt yesterday.  Perhaps adjusting his oral DM meds would be safer?  At this time, I am concerned pt may not even remember to take his oral meds either.  Prior to admission was taking the following:  Jardiance 25 mg Daily Glucotrol 10 mg Daily Metformin 500 mg bid Perhaps we could increase the Metformin to 1000 mg BID and Increase the Glipizide to 10 mg BID? Sent Secure Chat to [name] to let him know all of the above info.     --Will follow patient during hospitalization--  [name] RN, MSN,  CDE Diabetes Coordinator Inpatient Glycemic Control Team Team Pager: [phone] (8a-5p)

## 2020-07-25 NOTE — Progress Notes (Signed)
PROGRESS NOTE    Allen Hunter.  VHQ:[id number] DOB: Dec 15, 1940 DOA: 07/20/2020 PCP: [name], MD   Chief Complaint  Patient presents with  . Altered Mental Status  Brief Narrative: 80 year old male with history of diabetes with stage III CKD, prostate cancer, dyslipidemia brought to the ED by EMS due to altered mental status.  Patient was seen in the ED found to have diabetic ketoacidosis acute metabolic encephalopathy pseudohyponatremia and was admitted for further management. Patient was optimized and subsequently transferred from insulin drip to subcu insulin Nursing and diabetic coordinator has been working diligently for insulin education teaching.  Subjective: Seen and examined.   Alert awake at times forgetful nursing reports difficulty with  Self insulin administration He is confused at times.  Assessment & Plan:  DKA POA.  Resolved.    Type 2 diabetes mellitus with uncontrolled hyperglycemia, hemoglobin A1c 13: Currently being managed with Lantus 22 units and Premeal NovoLog 4 units, and sliding scale. Nursing staff DM coordinator has been working on education and insulin teaching and injection.  Unfortunately patient has no family at times insulin administration, because of could be helping with pain block/insulin due to acute Denti-Freeze.  Nursing reports patient may not be safe to go on insulin injection recommended he is not able to administer without lot of assistance.  If he is not safe to go home with insulin injection we may have to resort to oral hypoglycemic, will d/w DM coordinator. Will maximize his po meds glipizide 10mg  bid, metofrmin 1000 mg bid, jardiance 25 mg,  and d/c insulin. Recent Labs  Lab 07/24/20 1140 07/24/20 1602 07/24/20 2117 07/25/20 0844 07/25/20 1145  GLUCAP 174* 163* 309* 129* 413*   Acute metabolic encephalopathy in the setting of DKA.  Mental status is stable, at times forgetful likely age related.  COPD: Stable not in  exacerbation.  Continue bronchodilators  Hyponatremia: Pseudohyponatremia.  Stable  AKI/dehydration: Due to #1.It has resolved. Recent Labs  Lab 07/21/20 0448 07/22/20 0504 07/23/20 0433 07/24/20 0406 07/25/20 0504  BUN 34* 33* 25* 33* 29*  CREATININE 1.18 1.19 0.88 1.05 1.08    Diet Order            Diet Carb Modified Fluid consistency: Thin; Room service appropriate? Yes  Diet effective now                 DVT prophylaxis: enoxaparin (LOVENOX) injection 40 mg Start: 07/20/20 2200 Code Status:   Code Status: Full Code  Family Communication: plan of care discussed with patient at bedside.  Status is: Inpatient  Remains inpatient appropriate because:Unsafe d/c plan and Inpatient level of care appropriate due to severity of illness Dispo: The patient is from: Home              Anticipated d/c is to: Home once stable.              Patient currently is not medically stable to d/c.   Difficult to place patient No Unresulted Labs (From admission, onward)          Start     Ordered   07/22/20 0500  CBC  Daily,   R      07/21/20 1709   07/22/20 0500  Magnesium  Daily,   R      07/21/20 1709         Medications reviewed:  Scheduled Meds: . allopurinol  300 mg Oral Daily  . aspirin EC  81 mg Oral Daily  .  atorvastatin  80 mg Oral Daily  . bisoprolol  5 mg Oral Daily  . darifenacin  7.5 mg Oral Daily  . enoxaparin (LOVENOX) injection  40 mg Subcutaneous Q24H  . fluticasone furoate-vilanterol  1 puff Inhalation Daily   And  . umeclidinium bromide  1 puff Inhalation Daily  . glipiZIDE  10 mg Oral BID AC  . insulin aspart  0-15 Units Subcutaneous TID WC  . Ipratropium-Albuterol  1 puff Inhalation Q6H  . linagliptin  5 mg Oral Daily  . loratadine  10 mg Oral Daily  . metFORMIN  1,000 mg Oral BID WC  . mirabegron ER  25 mg Oral Daily  . montelukast  10 mg Oral QHS  . multivitamin with minerals  1 tablet Oral Daily  . omega-3 acid ethyl esters  1 g Oral Daily  .  vitamin B-12  1,000 mcg Oral Daily   Continuous Infusions: . sodium chloride      Consultants:see note  Procedures:see note  Antimicrobials: Anti-infectives (From admission, onward)   None     Culture/Microbiology No results found for: SDES, SPECREQUEST, CULT, REPTSTATUS  Other culture-see note  Objective: Vitals: Today's Vitals   07/24/20 2325 07/25/20 0324 07/25/20 0846 07/25/20 1100  BP: 118/79 110/79 104/73 106/78  Pulse: 85 77 72 73  Resp: 20 18 18 18   Temp: 98 F (36.7 C) 97.8 F (36.6 C) 98 F (36.7 C) 98 F (36.7 C)  TempSrc: Oral Oral  Oral  SpO2: 97% 95% 96% 96%  Weight:      Height:      PainSc:   0-No pain     Intake/Output Summary (Last 24 hours) at 07/25/2020 1420 Last data filed at 07/24/2020 1838 Gross per 24 hour  Intake 240 ml  Output --  Net 240 ml   Filed Weights   07/20/20 1208 07/21/20 0340  Weight: 74.8 kg 73.5 kg   Weight change:   Intake/Output from previous day: 05/04 0701 - 05/05 0700 In: 480 [P.O.:480] Out: -  Intake/Output this shift: No intake/output data recorded. Filed Weights   07/20/20 1208 07/21/20 0340  Weight: 74.8 kg 73.5 kg    Examination: eneral exam: AAO older for age,weak appearing. HEENT:Oral mucosa moist, Ear/Nose WNL grossly, dentition normal. Respiratory system: bilaterally diminished, no crackles,no use of accessory muscle Cardiovascular system: S1 & S2 +, No JVD,. Gastrointestinal system: Abdomen soft,obese,NT,ND, BS+ Nervous System:Alert, awake, moving extremities and grossly nonfocal Extremities: no edema, distal peripheral pulses palpable.  Skin: No rashes,no icterus. MSK: Normal muscle bulk,tone, power  Data Reviewed: I have personally reviewed following labs and imaging studies CBC: Recent Labs  Lab 07/20/20 1213 07/22/20 0504 07/23/20 0433 07/24/20 0406 07/25/20 0504  WBC 3.4* 3.7* 3.4* 3.9* 4.7  NEUTROABS 2.2  --   --   --   --   HGB 15.6 13.4 13.1 13.3 13.1  HCT 49.4 40.4 39.8 40.4  39.5  MCV 94.8 91.6 91.7 91.2 91.2  PLT 191 156 140* 143* 734   Basic Metabolic Panel: Recent Labs  Lab 07/20/20 1213 07/20/20 1739 07/21/20 0448 07/22/20 0504 07/23/20 0433 07/24/20 0406 07/25/20 0504  NA 133*   < > 137 141 141 139 141  K 4.9   < > 4.0 3.8 3.7 4.5 4.1  CL 99   < > 105 108 107 105 106  CO2 11*   < > 21* 28 28 28 29   GLUCOSE 302*   < > 151* 100* 175* 284* 181*  BUN 42*   < >  34* 33* 25* 33* 29*  CREATININE 1.77*   < > 1.18 1.19 0.88 1.05 1.08  CALCIUM 9.2   < > 8.8* 9.1 9.0 9.0 9.0  MG 2.1  --   --  2.1 1.9 1.9 1.9   < > = values in this interval not displayed.   GFR: Estimated Creatinine Clearance: 46.7 mL/min (by C-G formula based on SCr of 1.08 mg/dL). Liver Function Tests: Recent Labs  Lab 07/20/20 1213  AST 19  ALT 12  ALKPHOS 51  BILITOT 2.5*  PROT 6.8  ALBUMIN 3.9   No results for input(s): LIPASE, AMYLASE in the last 168 hours. Recent Labs  Lab 07/20/20 1214  AMMONIA 17   Coagulation Profile: No results for input(s): INR, PROTIME in the last 168 hours. Cardiac Enzymes: No results for input(s): CKTOTAL, CKMB, CKMBINDEX, TROPONINI in the last 168 hours. BNP (last 3 results) No results for input(s): PROBNP in the last 8760 hours. HbA1C: No results for input(s): HGBA1C in the last 72 hours. CBG: Recent Labs  Lab 07/24/20 1140 07/24/20 1602 07/24/20 2117 07/25/20 0844 07/25/20 1145  GLUCAP 174* 163* 309* 129* 183*   Lipid Profile: No results for input(s): CHOL, HDL, LDLCALC, TRIG, CHOLHDL, LDLDIRECT in the last 72 hours. Thyroid Function Tests: No results for input(s): TSH, T4TOTAL, FREET4, T3FREE, THYROIDAB in the last 72 hours. Anemia Panel: No results for input(s): VITAMINB12, FOLATE, FERRITIN, TIBC, IRON, RETICCTPCT in the last 72 hours. Sepsis Labs: No results for input(s): PROCALCITON, LATICACIDVEN in the last 168 hours.  Recent Results (from the past 240 hour(s))  Resp Panel by RT-PCR (Flu A&B, Covid) Nasopharyngeal  Swab     Status: None   Collection Time: 07/20/20  1:43 PM   Specimen: Nasopharyngeal Swab; Nasopharyngeal(NP) swabs in vial transport medium  Result Value Ref Range Status   SARS Coronavirus 2 by RT PCR NEGATIVE NEGATIVE Final    Comment: (NOTE) SARS-CoV-2 target nucleic acids are NOT DETECTED.  The SARS-CoV-2 RNA is generally detectable in upper respiratory specimens during the acute phase of infection. The lowest concentration of SARS-CoV-2 viral copies this assay can detect is 138 copies/mL. A negative result does not preclude SARS-Cov-2 infection and should not be used as the sole basis for treatment or other patient management decisions. A negative result may occur with  improper specimen collection/handling, submission of specimen other than nasopharyngeal swab, presence of viral mutation(s) within the areas targeted by this assay, and inadequate number of viral copies(<138 copies/mL). A negative result must be combined with clinical observations, patient history, and epidemiological information. The expected result is Negative.  Fact Sheet for Patients:  EntrepreneurPulse.com.au  Fact Sheet for Healthcare Providers:  IncredibleEmployment.be  This test is no t yet approved or cleared by the [place] FDA and  has been authorized for detection and/or diagnosis of SARS-CoV-2 by FDA under an Emergency Use Authorization (EUA). This EUA will remain  in effect (meaning this test can be used) for the duration of the COVID-19 declaration under Section 564(b)(1) of the Act, 21 U.S.C.section 360bbb-3(b)(1), unless the authorization is terminated  or revoked sooner.       Influenza A by PCR NEGATIVE NEGATIVE Final   Influenza B by PCR NEGATIVE NEGATIVE Final    Comment: (NOTE) The Xpert Xpress SARS-CoV-2/FLU/RSV plus assay is intended as an aid in the diagnosis of influenza from Nasopharyngeal swab specimens and should not be used as a sole  basis for treatment. Nasal washings and aspirates are unacceptable for Xpert Xpress SARS-CoV-2/FLU/RSV  testing.  Fact Sheet for Patients: EntrepreneurPulse.com.au  Fact Sheet for Healthcare Providers: IncredibleEmployment.be  This test is not yet approved or cleared by the [place] FDA and has been authorized for detection and/or diagnosis of SARS-CoV-2 by FDA under an Emergency Use Authorization (EUA). This EUA will remain in effect (meaning this test can be used) for the duration of the COVID-19 declaration under Section 564(b)(1) of the Act, 21 U.S.C. section 360bbb-3(b)(1), unless the authorization is terminated or revoked.  Performed at [facility], [address], [place], [place] [address]      Radiology Studies: No results found.   LOS: 5 days   Antonieta Pert, MD [facility]  07/25/2020, 2:20 PM

## 2020-07-25 NOTE — Plan of Care (Signed)
Pt alert to person,place. Pt disoriented to time and situation. Forgetful and attempts to be noncompliant with diet. Pt forgetful and gets up having incontinent of urine. Pt has to be coached and assisted to administer insulin. Vitals stable.  Problem: Education: Goal: Knowledge of General Education information will improve Description: Including pain rating scale, medication(s)/side effects and non-pharmacologic comfort measures Outcome: Progressing   Problem: Health Behavior/Discharge Planning: Goal: Ability to manage health-related needs will improve Outcome: Progressing   Problem: Clinical Measurements: Goal: Ability to maintain clinical measurements within normal limits will improve Outcome: Progressing Goal: Will remain free from infection Outcome: Progressing Goal: Diagnostic test results will improve Outcome: Progressing Goal: Respiratory complications will improve Outcome: Progressing Goal: Cardiovascular complication will be avoided Outcome: Progressing   Problem: Activity: Goal: Risk for activity intolerance will decrease Outcome: Progressing   Problem: Nutrition: Goal: Adequate nutrition will be maintained Outcome: Progressing   Problem: Coping: Goal: Level of anxiety will decrease Outcome: Progressing   Problem: Elimination: Goal: Will not experience complications related to bowel motility Outcome: Progressing Goal: Will not experience complications related to urinary retention Outcome: Progressing   Problem: Pain Managment: Goal: General experience of comfort will improve Outcome: Progressing   Problem: Safety: Goal: Ability to remain free from injury will improve Outcome: Progressing   Problem: Skin Integrity: Goal: Risk for impaired skin integrity will decrease Outcome: Progressing   Problem: Education: Goal: Ability to describe self-care measures that may prevent or decrease complications (Diabetes Survival Skills Education) will improve Outcome:  Progressing Goal: Individualized Educational Video(s) Outcome: Progressing   Problem: Coping: Goal: Ability to adjust to condition or change in health will improve Outcome: Progressing   Problem: Fluid Volume: Goal: Ability to maintain a balanced intake and output will improve Outcome: Progressing   Problem: Health Behavior/Discharge Planning: Goal: Ability to identify and utilize available resources and services will improve Outcome: Progressing Goal: Ability to manage health-related needs will improve Outcome: Progressing   Problem: Metabolic: Goal: Ability to maintain appropriate glucose levels will improve Outcome: Progressing   Problem: Nutritional: Goal: Maintenance of adequate nutrition will improve Outcome: Progressing Goal: Progress toward achieving an optimal weight will improve Outcome: Progressing   Problem: Skin Integrity: Goal: Risk for impaired skin integrity will decrease Outcome: Progressing   Problem: Tissue Perfusion: Goal: Adequacy of tissue perfusion will improve Outcome: Progressing

## 2020-07-25 NOTE — Progress Notes (Signed)
   07/25/20 2100  Safety Interventions  Less Restrictive Interventions  (Pt refused bedalarm to CNA/Primary [employer] fast streamer. Charge RN Celina aware.)

## 2020-07-25 NOTE — TOC Progression Note (Signed)
Transition of Care (TOC) - Progression Note    Patient Details  Name: Allen Hunter. MRN: [id number] Date of Birth: [birth year]  Transition of Care [facility]) CM/SW Contact  Shelbie Hutching, RN Phone Number: 07/25/2020, 3:28 PM  Clinical Narrative:    Patient is still having trouble learning how to give himself insulin injections.  Diabetes Coordinator and MD have decided to just do oral medications instead of insulin, patient needs to stay one more night to make sure the oral regimen is working.  Plan for DC tomorrow with home health.    Expected Discharge Plan: Home/Self Care Barriers to Discharge: Barriers Resolved  Expected Discharge Plan and Services Expected Discharge Plan: Home/Self Care       Living arrangements for the past 2 months: Single Family Home Expected Discharge Date: 07/23/20               DME Arranged: Gilford Rile rolling DME Agency: [employer] Date DME Agency Contacted: 07/23/20 Time DME Agency Contacted: 1250 Representative spoke with at DME Agency: [place]: RN,PT,OT,Nurse's [place]: [facility] Date [place]: 07/23/20 Time [place]: 1250 Representative spoke with at [place]: [place]   Social Determinants of Health ([place]) Interventions    Readmission Risk Interventions No flowsheet data found.

## 2020-07-26 LAB — CBC
HCT: 37.9 % — ABNORMAL LOW (ref 39.0–52.0)
Hemoglobin: 12.4 g/dL — ABNORMAL LOW (ref 13.0–17.0)
MCH: 30.3 pg (ref 26.0–34.0)
MCHC: 32.7 g/dL (ref 30.0–36.0)
MCV: 92.7 fL (ref 80.0–100.0)
Platelets: 176 10*3/uL (ref 150–400)
RBC: 4.09 MIL/uL — ABNORMAL LOW (ref 4.22–5.81)
RDW: 15.7 % — ABNORMAL HIGH (ref 11.5–15.5)
WBC: 4.2 10*3/uL (ref 4.0–10.5)
nRBC: 0 % (ref 0.0–0.2)

## 2020-07-26 LAB — BASIC METABOLIC PANEL
Anion gap: 8 (ref 5–15)
BUN: 27 mg/dL — ABNORMAL HIGH (ref 8–23)
CO2: 28 mmol/L (ref 22–32)
Calcium: 8.8 mg/dL — ABNORMAL LOW (ref 8.9–10.3)
Chloride: 104 mmol/L (ref 98–111)
Creatinine, Ser: 0.95 mg/dL (ref 0.61–1.24)
GFR, Estimated: >60 mL/min (ref >60)
Glucose, Bld: 198 mg/dL — ABNORMAL HIGH (ref 70–99)
Potassium: 4.2 mmol/L (ref 3.5–5.1)
Sodium: 140 mmol/L (ref 135–145)

## 2020-07-26 LAB — GLUCOSE, CAPILLARY
Glucose-Capillary: 244 mg/dL — ABNORMAL HIGH (ref 70–99)
Glucose-Capillary: 218 mg/dL — ABNORMAL HIGH (ref 70–99)
Glucose-Capillary: 234 mg/dL — ABNORMAL HIGH (ref 70–99)
Glucose-Capillary: 220 mg/dL — ABNORMAL HIGH (ref 70–99)

## 2020-07-26 LAB — MAGNESIUM: Magnesium: 1.8 mg/dL (ref 1.7–2.4)

## 2020-07-26 MED ORDER — GLIPIZIDE 10 MG PO TABS: 10.0000 mg | ORAL_TABLET | Freq: Two times a day (BID) | ORAL | 0 refills | Status: DC

## 2020-07-26 MED ORDER — PIOGLITAZONE HCL 15 MG PO TABS: 15.0000 mg | ORAL_TABLET | Freq: Every day | ORAL | 0 refills | Status: DC

## 2020-07-26 MED ORDER — METFORMIN HCL 1000 MG PO TABS
1000.0000 mg | ORAL_TABLET | Freq: Two times a day (BID) | ORAL | 0 refills | Status: DC
Start: 2020-07-26 — End: 2020-08-01

## 2020-07-26 MED ORDER — EMPAGLIFLOZIN 25 MG PO TABS: 25.0000 mg | ORAL_TABLET | Freq: Every day | ORAL | 0 refills | Status: DC

## 2020-07-26 MED ORDER — PIOGLITAZONE HCL 15 MG PO TABS
15.0000 mg | ORAL_TABLET | Freq: Every day | ORAL | Status: DC
  Administered 2020-07-26 – 2020-07-29 (×4): 15 mg via ORAL
  Filled 2020-07-26 (×4): qty 1

## 2020-07-26 NOTE — Progress Notes (Signed)
PROGRESS NOTE    Allen Hunter.  WIO:[id number] DOB: 1940-06-17 DOA: 07/20/2020 PCP: [name], MD   Chief Complaint  Patient presents with  . Altered Mental Status  Brief Narrative: 80 year old male with history of diabetes with stage III CKD, prostate cancer, dyslipidemia brought to the ED by EMS due to altered mental status. Patient was seen in the ED found to have diabetic ketoacidosis acute metabolic encephalopathy pseudohyponatremia and was admitted for further management. Patient was optimized and subsequently transferred from insulin drip to subcu insulin Nursing and diabetic coordinator has been working diligently for insulin education  Subjective: Alert awake interactive agreeable for disposition to skilled nursing facility.  Blood sugar still not well controlled.  Assessment and plan  DKA EQA:[id number].    T2DM with uncontrolled hyperglycemia,hemoglobin A1c 13:Nursing and diabetic coordinator has been working diligently for insulin education teaching.Not sure how safe is he to take insulin. May be we can do once daily lantus PEN. Cont on Glipizide 10 mg BID (In am likely transition to lantus)) metformin 1000 twice daily, stop Jardiance 25,on Actos- can wean off. Cont ssi.  You will likely benefit with a skilled nursing facility placement given that he will need insulin we will have PT OT reevaluate.  Discussed extensively with patient and patient's cousin and both are in agreement. Recent Labs  Lab 07/25/20 1633 07/25/20 2058 07/26/20 0731 07/26/20 1232 07/26/20 1605  GLUCAP 273* 188* 244* 218* 297*   Acute metabolic encephalopathy in the setting of DKA.  Mental status is stable, at times forgetful likely age related. COPD:Stable not continue as needed bronchodilators. Hyponatremia: Pseudohyponatremia. Stable AKI/dehydration: Due to #1.It has resolved.\ Recent Labs  Lab 07/22/20 0504 07/23/20 0433 07/24/20 0406 07/25/20 0504 07/26/20 0338  BUN 33* 25* 33*  29* 27*  CREATININE 1.19 0.88 1.05 1.08 0.95    Diet Order            Diet Carb Modified           Diet Carb Modified Fluid consistency: Thin; Room service appropriate? Yes  Diet effective now                 DVT prophylaxis: enoxaparin (LOVENOX) injection 40 mg Start: 07/20/20 2200 Code Status:   Code Status: Full Code  Family Communication: plan of care discussed with patient at bedside.  Status is: Inpatient  Remains inpatient appropriate because:Unsafe d/c plan and Inpatient level of care appropriate due to severity of illness Dispo: The patient is from: Home              Anticipated d/c is to: WILL NEED SNF              Patient currently is not medically stable to d/c.   Difficult to place patient No Unresulted Labs (From admission, onward)          Start     Ordered   07/26/20 9892  Basic metabolic panel  Daily,   R     Question:  Specimen collection method  Answer:  Lab=Lab collect   07/25/20 1423         Medications reviewed:  Scheduled Meds: . allopurinol  300 mg Oral Daily  . aspirin EC  81 mg Oral Daily  . atorvastatin  80 mg Oral Daily  . bisoprolol  5 mg Oral Daily  . darifenacin  7.5 mg Oral Daily  . empagliflozin  25 mg Oral Daily  . enoxaparin (LOVENOX) injection  40 mg Subcutaneous Q24H  .  fluticasone furoate-vilanterol  1 puff Inhalation Daily   And  . umeclidinium bromide  1 puff Inhalation Daily  . glipiZIDE  10 mg Oral BID AC  . insulin aspart  0-15 Units Subcutaneous TID WC  . Ipratropium-Albuterol  1 puff Inhalation Q6H  . loratadine  10 mg Oral Daily  . metFORMIN  1,000 mg Oral BID WC  . mirabegron ER  25 mg Oral Daily  . montelukast  10 mg Oral QHS  . multivitamin with minerals  1 tablet Oral Daily  . omega-3 acid ethyl esters  1 g Oral Daily  . pioglitazone  15 mg Oral Daily  . vitamin B-12  1,000 mcg Oral Daily   Continuous Infusions:   Consultants:see note  Procedures:see note  Antimicrobials: Anti-infectives (From  admission, onward)   None     Culture/Microbiology No results found for: SDES, SPECREQUEST, CULT, REPTSTATUS  Other culture-see note  Objective: Vitals: Today's Vitals   07/25/20 1951 07/25/20 2326 07/26/20 0434 07/26/20 0732  BP:  109/75 107/67 109/81  Pulse:  88 70 87  Resp:  18 20 16   Temp:  (!) 97.4 F (36.3 C) 97.6 F (36.4 C) 98.2 F (36.8 C)  TempSrc:  Oral    SpO2:  98% 98% 95%  Weight:      Height:      PainSc: 0-No pain   0-No pain    Intake/Output Summary (Last 24 hours) at 07/26/2020 1636 Last data filed at 07/26/2020 1029 Gross per 24 hour  Intake 240 ml  Output --  Net 240 ml   Filed Weights   07/20/20 1208 07/21/20 0340  Weight: 74.8 kg 73.5 kg   Weight change:   Intake/Output from previous day: No intake/output data recorded. Intake/Output this shift: Total I/O In: 240 [P.O.:240] Out: -  Filed Weights   07/20/20 1208 07/21/20 0340  Weight: 74.8 kg 73.5 kg    Examination: eneral exam: AAO older for age,weak appearing. HEENT:Oral mucosa moist, Ear/Nose WNL grossly, dentition normal. Respiratory system: bilaterally diminished, no crackles,no use of accessory muscle Cardiovascular system: S1 & S2 +, No JVD,. Gastrointestinal system: Abdomen soft,obese,NT,ND, BS+ Nervous System:Alert, awake, moving extremities and grossly nonfocal Extremities: no edema, distal peripheral pulses palpable.  Skin: No rashes,no icterus. MSK: Normal muscle bulk,tone, power  Data Reviewed: I have personally reviewed following labs and imaging studies CBC: Recent Labs  Lab 07/20/20 1213 07/22/20 0504 07/23/20 0433 07/24/20 0406 07/25/20 0504 07/26/20 0338  WBC 3.4* 3.7* 3.4* 3.9* 4.7 4.2  NEUTROABS 2.2  --   --   --   --   --   HGB 15.6 13.4 13.1 13.3 13.1 12.4*  HCT 49.4 40.4 39.8 40.4 39.5 37.9*  MCV 94.8 91.6 91.7 91.2 91.2 92.7  PLT 191 156 140* 143* 175 0000000   Basic Metabolic Panel: Recent Labs  Lab 07/22/20 0504 07/23/20 0433 07/24/20 0406  07/25/20 0504 07/26/20 0338  NA 141 141 139 141 140  K 3.8 3.7 4.5 4.1 4.2  CL 108 107 105 106 104  CO2 28 28 28 29 28   GLUCOSE 100* 175* 284* 181* 198*  BUN 33* 25* 33* 29* 27*  CREATININE 1.19 0.88 1.05 1.08 0.95  CALCIUM 9.1 9.0 9.0 9.0 8.8*  MG 2.1 1.9 1.9 1.9 1.8   GFR: Estimated Creatinine Clearance: 53 mL/min (by C-G formula based on SCr of 0.95 mg/dL). Liver Function Tests: Recent Labs  Lab 07/20/20 1213  AST 19  ALT 12  ALKPHOS 51  BILITOT 2.5*  PROT 6.8  ALBUMIN 3.9   No results for input(s): LIPASE, AMYLASE in the last 168 hours. Recent Labs  Lab 07/20/20 1214  AMMONIA 17   Coagulation Profile: No results for input(s): INR, PROTIME in the last 168 hours. Cardiac Enzymes: No results for input(s): CKTOTAL, CKMB, CKMBINDEX, TROPONINI in the last 168 hours. BNP (last 3 results) No results for input(s): PROBNP in the last 8760 hours. HbA1C: No results for input(s): HGBA1C in the last 72 hours. CBG: Recent Labs  Lab 07/25/20 1633 07/25/20 2058 07/26/20 0731 07/26/20 1232 07/26/20 1605  GLUCAP 273* 188* 244* 218* 234*   Lipid Profile: No results for input(s): CHOL, HDL, LDLCALC, TRIG, CHOLHDL, LDLDIRECT in the last 72 hours. Thyroid Function Tests: No results for input(s): TSH, T4TOTAL, FREET4, T3FREE, THYROIDAB in the last 72 hours. Anemia Panel: No results for input(s): VITAMINB12, FOLATE, FERRITIN, TIBC, IRON, RETICCTPCT in the last 72 hours. Sepsis Labs: No results for input(s): PROCALCITON, LATICACIDVEN in the last 168 hours.  Recent Results (from the past 240 hour(s))  Resp Panel by RT-PCR (Flu A&B, Covid) Nasopharyngeal Swab     Status: None   Collection Time: 07/20/20  1:43 PM   Specimen: Nasopharyngeal Swab; Nasopharyngeal(NP) swabs in vial transport medium  Result Value Ref Range Status   SARS Coronavirus 2 by RT PCR NEGATIVE NEGATIVE Final    Comment: (NOTE) SARS-CoV-2 target nucleic acids are NOT DETECTED.  The SARS-CoV-2 RNA is  generally detectable in upper respiratory specimens during the acute phase of infection. The lowest concentration of SARS-CoV-2 viral copies this assay can detect is 138 copies/mL. A negative result does not preclude SARS-Cov-2 infection and should not be used as the sole basis for treatment or other patient management decisions. A negative result may occur with  improper specimen collection/handling, submission of specimen other than nasopharyngeal swab, presence of viral mutation(s) within the areas targeted by this assay, and inadequate number of viral copies(<138 copies/mL). A negative result must be combined with clinical observations, patient history, and epidemiological information. The expected result is Negative.  Fact Sheet for Patients:  EntrepreneurPulse.com.au  Fact Sheet for Healthcare Providers:  IncredibleEmployment.be  This test is no t yet approved or cleared by the [place] FDA and  has been authorized for detection and/or diagnosis of SARS-CoV-2 by FDA under an Emergency Use Authorization (EUA). This EUA will remain  in effect (meaning this test can be used) for the duration of the COVID-19 declaration under Section 564(b)(1) of the Act, 21 U.S.C.section 360bbb-3(b)(1), unless the authorization is terminated  or revoked sooner.       Influenza A by PCR NEGATIVE NEGATIVE Final   Influenza B by PCR NEGATIVE NEGATIVE Final    Comment: (NOTE) The Xpert Xpress SARS-CoV-2/FLU/RSV plus assay is intended as an aid in the diagnosis of influenza from Nasopharyngeal swab specimens and should not be used as a sole basis for treatment. Nasal washings and aspirates are unacceptable for Xpert Xpress SARS-CoV-2/FLU/RSV testing.  Fact Sheet for Patients: EntrepreneurPulse.com.au  Fact Sheet for Healthcare Providers: IncredibleEmployment.be  This test is not yet approved or cleared by the [place] FDA and has been authorized for detection and/or diagnosis of SARS-CoV-2 by FDA under an Emergency Use Authorization (EUA). This EUA will remain in effect (meaning this test can be used) for the duration of the COVID-19 declaration under Section 564(b)(1) of the Act, 21 U.S.C. section 360bbb-3(b)(1), unless the authorization is terminated or revoked.  Performed at [facility], [address],  [place], [place] [address]      Radiology Studies: No results found.   LOS: 6 days   Antonieta Pert, MD [facility]  07/26/2020, 4:36 PM

## 2020-07-26 NOTE — Progress Notes (Signed)
PROGRESS NOTE    Allen Hunter.  WCB:[id number] DOB: [birth year] DOA: 07/20/2020 PCP: [name], MD   Chief Complaint  Patient presents with  . Altered Mental Status  Brief Narrative: 80 year old male with history of diabetes with stage III CKD, prostate cancer, dyslipidemia brought to the ED by EMS due to altered mental status.  Patient was seen in the ED found to have diabetic ketoacidosis acute metabolic encephalopathy pseudohyponatremia and was admitted for further management. Patient was optimized and subsequently transferred from insulin drip to subcu insulin Nursing and diabetic coordinator has been working diligently for insulin education teaching. High risk for readmission and DKA will at least need Levemir daily dosing along with oral hypoglycemic examined, he will need placement in  SNF.  Subjective: Working with OT this morning. No new complaints. He is alert oriented x3 agreeable on going to rehab and insulin.  Assessment & Plan:  DKA POA.  Resolved.    T2DM with uncontrolled hyperglycemia,hemoglobin A1c 13:Nursing and diabetic coordinator has been working diligently for insulin education teaching.Not sure how safe is he to take insulin without supervision, high risk for DKA with ongoing hyperglycemia.  He may be able to use Lantus pain.discontinue glipizide 10 mg BID, add Lantus 10 units daily, continue metformin 1000 twice daily, Actos.  Continue to work on insulin teaching.  He will need a skilled nursing facility placement continue to work with PT OT.  Had extensive discussion with patient and patient's cousin and both of them have verbalized and understand plan of care. Recent Labs  Lab 07/26/20 0731 07/26/20 1232 07/26/20 1605 07/26/20 2104 07/27/20 0748  GLUCAP 244* 218* 234* 220* 616*   Acute metabolic encephalopathy in the setting of DKA.  He appears alert awake oriented x3.?  At times forgetful likely age related.  COPD:Stable not continue as needed  bronchodilators. Hyponatremia: Pseudohyponatremia. Stable AKI/dehydration: Creatinine again uptrending 1.3 this morning started on IV fluid hydration.  Monitor BMP   Diet Order            Diet Carb Modified           Diet Carb Modified Fluid consistency: Thin; Room service appropriate? Yes  Diet effective now               Patient's Body mass index is 26.96 kg/m. DVT prophylaxis: enoxaparin (LOVENOX) injection 40 mg Start: 07/20/20 2200 Code Status:   Code Status: Full Code  Family Communication: plan of care discussed with patient at bedside.  Status is: Inpatient Remains inpatient appropriate because:Unsafe d/c plan and Inpatient level of care appropriate due to severity of illness  Dispo: The patient is from: Home              Anticipated d/c is to: SNF              Patient currently is not medically stable to d/c.   Difficult to place patient No       Unresulted Labs (From admission, onward)          Start     Ordered   07/26/20 0737  Basic metabolic panel  Daily,   R     Question:  Specimen collection method  Answer:  Lab=Lab collect   07/25/20 1423        Medications reviewed:  Scheduled Meds: . allopurinol  300 mg Oral Daily  . aspirin EC  81 mg Oral Daily  . atorvastatin  80 mg Oral Daily  . bisoprolol  5  mg Oral Daily  . darifenacin  7.5 mg Oral Daily  . enoxaparin (LOVENOX) injection  40 mg Subcutaneous Q24H  . fluticasone furoate-vilanterol  1 puff Inhalation Daily   And  . umeclidinium bromide  1 puff Inhalation Daily  . insulin aspart  0-15 Units Subcutaneous TID WC  . insulin glargine  10 Units Subcutaneous Daily  . Ipratropium-Albuterol  1 puff Inhalation Q6H  . loratadine  10 mg Oral Daily  . metFORMIN  1,000 mg Oral BID WC  . mirabegron ER  25 mg Oral Daily  . montelukast  10 mg Oral QHS  . multivitamin with minerals  1 tablet Oral Daily  . omega-3 acid ethyl esters  1 g Oral Daily  . pioglitazone  15 mg Oral Daily  . vitamin B-12  1,000  mcg Oral Daily   Continuous Infusions: . lactated ringers 75 mL/hr at 07/27/20 0831    Consultants:see note  Procedures:see note  Antimicrobials: Anti-infectives (From admission, onward)   None     Culture/Microbiology No results found for: SDES, SPECREQUEST, CULT, REPTSTATUS  Other culture-see note  Objective: Vitals: Today's Vitals   07/26/20 2025 07/27/20 0037 07/27/20 0417 07/27/20 0751  BP: 104/63 105/66 112/64 112/70  Pulse: 75 83 73 73  Resp: 18 18 16 20   Temp: 98.5 F (36.9 C) 98.5 F (36.9 C) 97.9 F (36.6 C) 98.1 F (36.7 C)  TempSrc: Oral Oral Oral Oral  SpO2: 96% 95% 95% 96%  Weight:      Height:      PainSc: 0-No pain   0-No pain    Intake/Output Summary (Last 24 hours) at 07/27/2020 1050 Last data filed at 07/27/2020 1032 Gross per 24 hour  Intake 480 ml  Output --  Net 480 ml   Filed Weights   07/20/20 1208 07/21/20 0340  Weight: 74.8 kg 73.5 kg   Weight change:   Intake/Output from previous day: 05/06 0701 - 05/07 0700 In: 480 [P.O.:480] Out: -  Intake/Output this shift: Total I/O In: 240 [P.O.:240] Out: -  Filed Weights   07/20/20 1208 07/21/20 0340  Weight: 74.8 kg 73.5 kg    Examination: General exam: AAO x3, older than stated age, weak appearing. HEENT:Oral mucosa moist, Ear/Nose WNL grossly,dentition normal. Respiratory system: bilaterally diminished, no crackles no use of accessory muscle, non tender. Cardiovascular system: S1 & S2 +,obeseNo JVD. Gastrointestinal system: Abdomen soft, NT,ND, BS+. Nervous System:Alert, awake, moving extremities Extremities: no edema, distal peripheral pulses palpable.  Skin: No rashes,no icterus. MSK: Normal muscle bulk,tone, power  Data Reviewed: I have personally reviewed following labs and imaging studies CBC: Recent Labs  Lab 07/20/20 1213 07/22/20 0504 07/23/20 0433 07/24/20 0406 07/25/20 0504 07/26/20 0338  WBC 3.4* 3.7* 3.4* 3.9* 4.7 4.2  NEUTROABS 2.2  --   --   --   --   --    HGB 15.6 13.4 13.1 13.3 13.1 12.4*  HCT 49.4 40.4 39.8 40.4 39.5 37.9*  MCV 94.8 91.6 91.7 91.2 91.2 92.7  PLT 191 156 140* 143* 175 0000000   Basic Metabolic Panel: Recent Labs  Lab 07/22/20 0504 07/23/20 0433 07/24/20 0406 07/25/20 0504 07/26/20 0338 07/27/20 0501  NA 141 141 139 141 140 136  K 3.8 3.7 4.5 4.1 4.2 4.8  CL 108 107 105 106 104 101  CO2 28 28 28 29 28 28   GLUCOSE 100* 175* 284* 181* 198* 265*  BUN 33* 25* 33* 29* 27* 32*  CREATININE 1.19 0.88 1.05 1.08 0.95 1.32*  CALCIUM 9.1 9.0 9.0 9.0 8.8* 9.0  MG 2.1 1.9 1.9 1.9 1.8  --    GFR: Estimated Creatinine Clearance: 38.2 mL/min (A) (by C-G formula based on SCr of 1.32 mg/dL (H)). Liver Function Tests: Recent Labs  Lab 07/20/20 1213  AST 19  ALT 12  ALKPHOS 51  BILITOT 2.5*  PROT 6.8  ALBUMIN 3.9   No results for input(s): LIPASE, AMYLASE in the last 168 hours. Recent Labs  Lab 07/20/20 1214  AMMONIA 17   Coagulation Profile: No results for input(s): INR, PROTIME in the last 168 hours. Cardiac Enzymes: No results for input(s): CKTOTAL, CKMB, CKMBINDEX, TROPONINI in the last 168 hours. BNP (last 3 results) No results for input(s): PROBNP in the last 8760 hours. HbA1C: No results for input(s): HGBA1C in the last 72 hours. CBG: Recent Labs  Lab 07/26/20 0731 07/26/20 1232 07/26/20 1605 07/26/20 2104 07/27/20 0748  GLUCAP 244* 218* 234* 220* 242*   Lipid Profile: No results for input(s): CHOL, HDL, LDLCALC, TRIG, CHOLHDL, LDLDIRECT in the last 72 hours. Thyroid Function Tests: No results for input(s): TSH, T4TOTAL, FREET4, T3FREE, THYROIDAB in the last 72 hours. Anemia Panel: No results for input(s): VITAMINB12, FOLATE, FERRITIN, TIBC, IRON, RETICCTPCT in the last 72 hours. Sepsis Labs: No results for input(s): PROCALCITON, LATICACIDVEN in the last 168 hours.  Recent Results (from the past 240 hour(s))  Resp Panel by RT-PCR (Flu A&B, Covid) Nasopharyngeal Swab     Status: None   Collection  Time: 07/20/20  1:43 PM   Specimen: Nasopharyngeal Swab; Nasopharyngeal(NP) swabs in vial transport medium  Result Value Ref Range Status   SARS Coronavirus 2 by RT PCR NEGATIVE NEGATIVE Final    Comment: (NOTE) SARS-CoV-2 target nucleic acids are NOT DETECTED.  The SARS-CoV-2 RNA is generally detectable in upper respiratory specimens during the acute phase of infection. The lowest concentration of SARS-CoV-2 viral copies this assay can detect is 138 copies/mL. A negative result does not preclude SARS-Cov-2 infection and should not be used as the sole basis for treatment or other patient management decisions. A negative result may occur with  improper specimen collection/handling, submission of specimen other than nasopharyngeal swab, presence of viral mutation(s) within the areas targeted by this assay, and inadequate number of viral copies(<138 copies/mL). A negative result must be combined with clinical observations, patient history, and epidemiological information. The expected result is Negative.  Fact Sheet for Patients:  EntrepreneurPulse.com.au  Fact Sheet for Healthcare Providers:  IncredibleEmployment.be  This test is no t yet approved or cleared by the [place] FDA and  has been authorized for detection and/or diagnosis of SARS-CoV-2 by FDA under an Emergency Use Authorization (EUA). This EUA will remain  in effect (meaning this test can be used) for the duration of the COVID-19 declaration under Section 564(b)(1) of the Act, 21 U.S.C.section 360bbb-3(b)(1), unless the authorization is terminated  or revoked sooner.       Influenza A by PCR NEGATIVE NEGATIVE Final   Influenza B by PCR NEGATIVE NEGATIVE Final    Comment: (NOTE) The Xpert Xpress SARS-CoV-2/FLU/RSV plus assay is intended as an aid in the diagnosis of influenza from Nasopharyngeal swab specimens and should not be used as a sole basis for treatment. Nasal washings  and aspirates are unacceptable for Xpert Xpress SARS-CoV-2/FLU/RSV testing.  Fact Sheet for Patients: EntrepreneurPulse.com.au  Fact Sheet for Healthcare Providers: IncredibleEmployment.be  This test is not yet approved or cleared by the [place] FDA and has been authorized for detection  and/or diagnosis of SARS-CoV-2 by FDA under an Emergency Use Authorization (EUA). This EUA will remain in effect (meaning this test can be used) for the duration of the COVID-19 declaration under Section 564(b)(1) of the Act, 21 U.S.C. section 360bbb-3(b)(1), unless the authorization is terminated or revoked.  Performed at [facility], [address] [place], [place] [address]      Radiology Studies: No results found.   LOS: 7 days   Antonieta Pert, MD [facility]  07/27/2020, 10:50 AM

## 2020-07-26 NOTE — Plan of Care (Signed)
Pt blood sugar improved 188 at hs.  Problem: Education: Goal: Knowledge of General Education information will improve Description: Including pain rating scale, medication(s)/side effects and non-pharmacologic comfort measures Outcome: Progressing   Problem: Health Behavior/Discharge Planning: Goal: Ability to manage health-related needs will improve Outcome: Progressing   Problem: Clinical Measurements: Goal: Ability to maintain clinical measurements within normal limits will improve Outcome: Progressing Goal: Will remain free from infection Outcome: Progressing Goal: Diagnostic test results will improve Outcome: Progressing Goal: Respiratory complications will improve Outcome: Progressing Goal: Cardiovascular complication will be avoided Outcome: Progressing   Problem: Activity: Goal: Risk for activity intolerance will decrease Outcome: Progressing   Problem: Nutrition: Goal: Adequate nutrition will be maintained Outcome: Progressing   Problem: Coping: Goal: Level of anxiety will decrease Outcome: Progressing   Problem: Elimination: Goal: Will not experience complications related to bowel motility Outcome: Progressing Goal: Will not experience complications related to urinary retention Outcome: Progressing   Problem: Pain Managment: Goal: General experience of comfort will improve Outcome: Progressing   Problem: Safety: Goal: Ability to remain free from injury will improve Outcome: Progressing   Problem: Skin Integrity: Goal: Risk for impaired skin integrity will decrease Outcome: Progressing   Problem: Education: Goal: Ability to describe self-care measures that may prevent or decrease complications (Diabetes Survival Skills Education) will improve Outcome: Progressing Goal: Individualized Educational Video(s) Outcome: Progressing   Problem: Coping: Goal: Ability to adjust to condition or change in health will improve Outcome: Progressing   Problem:  Fluid Volume: Goal: Ability to maintain a balanced intake and output will improve Outcome: Progressing   Problem: Health Behavior/Discharge Planning: Goal: Ability to identify and utilize available resources and services will improve Outcome: Progressing Goal: Ability to manage health-related needs will improve Outcome: Progressing   Problem: Metabolic: Goal: Ability to maintain appropriate glucose levels will improve Outcome: Progressing   Problem: Nutritional: Goal: Maintenance of adequate nutrition will improve Outcome: Progressing Goal: Progress toward achieving an optimal weight will improve Outcome: Progressing   Problem: Skin Integrity: Goal: Risk for impaired skin integrity will decrease Outcome: Progressing   Problem: Tissue Perfusion: Goal: Adequacy of tissue perfusion will improve Outcome: Progressing

## 2020-07-26 NOTE — Care Management Important Message (Signed)
Important Message  Patient Details  Name: Allen Hunter. MRN: [id number] Date of Birth: 17-Apr-1940   Medicare Important Message Given:  Yes     Juliann Pulse A Sharnise Blough 07/26/2020, 11:13 AM

## 2020-07-26 NOTE — Progress Notes (Signed)
Physical Therapy Treatment Patient Details Name: Allen Hunter. MRN: [id number] DOB: [birth year] Today's Date: 07/26/2020    History of Present Illness presented to ER secondary to AMS; admitted for management of DKA    PT Comments    Pt was sitting EOB upon arriving. He is extremely pleasant and cooperative throughout. Easily able to stand and ambulate without assistive device > 500 ft. Performed ascending/descending stairs without safety concern. Session progressed to higher level dynamic balance exercises. Overall pt tolerated well. Only struggles with balance exercises that required eyes being closed. Acute PT will continue to follow while in hospital. He lives alone but states he has sister who checks in on him and friends hat could assist if needed. Pt was in standard chair in room at conclusion of session.   Follow Up Recommendations  Home health PT;Supervision - Intermittent     Equipment Recommendations  Rolling walker with 5" wheels       Precautions / Restrictions Precautions Precautions: Fall Restrictions Weight Bearing Restrictions: No    Mobility  Bed Mobility Overal bed mobility: Modified Independent   Transfers Overall transfer level: Needs assistance Equipment used: None Transfers: Sit to/from Stand Sit to Stand: Supervision   Ambulation/Gait Ambulation/Gait assistance: Supervision Gait Distance (Feet): 200 Feet Assistive device: None Gait Pattern/deviations: WFL(Within Functional Limits)  Stairs Stairs: Yes Stairs assistance: Supervision Stair Management: One rail Right;Forwards Number of Stairs: 6       Balance Overall balance assessment: Needs assistance Sitting-balance support: No upper extremity supported;Feet supported Sitting balance-Leahy Scale: Good     Standing balance support: No upper extremity supported Standing balance-Leahy Scale: Good   Single Leg Stance - Right Leg: 15 Single Leg Stance - Left Leg: 15 Tandem Stance - Right  Leg: 15 Tandem Stance - Left Leg: 15     High level balance activites: Side stepping;Braiding;Backward walking;Head turns;Turns;Direction changes (up) High Level Balance Comments: pt was able to perform higher level balance drills without much fall. slight unsteadiness at times but overall balance seems good even with dynamic exercises. pt only struggles with eyes closed while performing            Cognition Arousal/Alertness: Awake/alert Behavior During Therapy: WFL for tasks assessed/performed Overall Cognitive Status: Within Functional Limits for tasks assessed        General Comments: A and O x 4             Pertinent Vitals/Pain Pain Assessment: No/denies pain           PT Goals (current goals can now be found in the care plan section) Acute Rehab PT Goals Patient Stated Goal: to return home Progress towards PT goals: Progressing toward goals    Frequency    Min 2X/week      PT Plan Current plan remains appropriate       AM-PAC PT "6 Clicks" Mobility   Outcome Measure  Help needed turning from your back to your side while in a flat bed without using bedrails?: None Help needed moving from lying on your back to sitting on the side of a flat bed without using bedrails?: None Help needed moving to and from a bed to a chair (including a wheelchair)?: None Help needed standing up from a chair using your arms (e.g., wheelchair or bedside chair)?: None Help needed to walk in hospital room?: None Help needed climbing 3-5 steps with a railing? : A Little 6 Click Score: 23    End of Session Equipment Utilized During  Treatment: Gait belt Activity Tolerance: Patient tolerated treatment well Patient left: in chair;with call bell/phone within reach Nurse Communication: Mobility status PT Visit Diagnosis: Muscle weakness (generalized) (M62.81);Difficulty in walking, not elsewhere classified (R26.2)     Time: 1440-1500 PT Time Calculation (min) (ACUTE ONLY):  20 min  Charges:  $Neuromuscular Re-education: 8-22 mins                     Julaine Fusi PTA 07/26/20, 4:54 PM

## 2020-07-27 LAB — BASIC METABOLIC PANEL
Anion gap: 7 (ref 5–15)
BUN: 32 mg/dL — ABNORMAL HIGH (ref 8–23)
CO2: 28 mmol/L (ref 22–32)
Calcium: 9 mg/dL (ref 8.9–10.3)
Chloride: 101 mmol/L (ref 98–111)
Creatinine, Ser: 1.32 mg/dL — ABNORMAL HIGH (ref 0.61–1.24)
GFR, Estimated: 54 mL/min — ABNORMAL LOW (ref >60)
Glucose, Bld: 265 mg/dL — ABNORMAL HIGH (ref 70–99)
Potassium: 4.8 mmol/L (ref 3.5–5.1)
Sodium: 136 mmol/L (ref 135–145)

## 2020-07-27 LAB — GLUCOSE, CAPILLARY
Glucose-Capillary: 242 mg/dL — ABNORMAL HIGH (ref 70–99)
Glucose-Capillary: 176 mg/dL — ABNORMAL HIGH (ref 70–99)
Glucose-Capillary: 213 mg/dL — ABNORMAL HIGH (ref 70–99)
Glucose-Capillary: 255 mg/dL — ABNORMAL HIGH (ref 70–99)

## 2020-07-27 MED ORDER — INSULIN GLARGINE 100 UNIT/ML ~~LOC~~ SOLN
10.0000 [IU] | Freq: Every day | SUBCUTANEOUS | Status: DC
  Administered 2020-07-27: 10 [IU] via SUBCUTANEOUS
  Filled 2020-07-27 (×2): qty 0.1

## 2020-07-27 MED ORDER — LACTATED RINGERS IV SOLN: INTRAVENOUS | Status: DC

## 2020-07-27 NOTE — Evaluation (Signed)
Occupational Therapy Evaluation Patient Details Name: Allen Hunter. MRN: [id number] DOB: September 07, 1940 Today's Date: 07/27/2020    History of Present Illness presented to ER secondary to AMS; admitted for management of DKA   Clinical Impression   Pt is a pleasant 80 yo male admitted to [facility] for AMS and management of DKA.  Patient was seen for OT evaluation this date.  Pt able to demonstrate basic self care tasks for bathing, dressing, grooming, self feeding and toileting this date.  He does present with some mild weakness but primarily presents with mild cognitive impairments with decreased safety, memory and difficulty with new learning tasks.  He states he did not realize he had diabetes prior to his admission and has no knowledge of diet related to managing blood sugars.  He is unable to recall the attempts this week by nursing to teach patient how to manage his insulin and states, "if they want me to know how to do something, they need to show me".  Pt appeared anxious on arrival and wanting to go home, appeared calmed just by the presence of a person in the room for a significant amount of time and didn't want therapist to leave at the end of the evaluation.  Pt lives alone and reports he does not have anyone who can come stay with him or check on his daily.  Patient also appears to have decreased insight into his current medical status and the potential consequences of not managing his diabetes appropriately.  He would need further evaluation for safety and judgment with higher level IADL tasks such as with home safety issues and safety with meal preparation.  Recommend 24 hour supervision and assist as needed.      Follow Up Recommendations  Supervision/Assistance - 24 hour    Equipment Recommendations       Recommendations for Other Services       Precautions / Restrictions Precautions Precautions: Fall      Mobility Bed Mobility               General bed mobility comments:  up to the chair on arrival    Transfers Overall transfer level: Needs assistance Equipment used: None Transfers: Sit to/from Stand Sit to Stand: Supervision              Balance           Standing balance support: No upper extremity supported Standing balance-Leahy Scale: Good                             ADL either performed or assessed with clinical judgement   ADL Overall ADL's : At baseline                                       General ADL Comments: Pt appears at baseline for self care tasks such as bathing, dressing, toileting and grooming and able to demonstrate these abilities during session.  Area of concern would be medication management for patient to return home, he does not recall any issues in the past with diabetes or blood sugar management, he is unable to recall attempts while he is here for managing insulin injections.  It is uncertain if patient can process new learning and have evidence of carryover in this area which is a barrier to his discharge.  Vision Baseline Vision/History: Wears glasses Additional Comments: Pt did not bring glasses to hospital but reports he wears them all the time.     Perception     Praxis      Pertinent Vitals/Pain Pain Assessment: No/denies pain     Hand Dominance Right   Extremity/Trunk Assessment Upper Extremity Assessment Upper Extremity Assessment: Overall WFL for tasks assessed   Lower Extremity Assessment Lower Extremity Assessment: Overall WFL for tasks assessed       Communication Communication Communication: No difficulties   Cognition Arousal/Alertness: Awake/alert Behavior During Therapy: WFL for tasks assessed/performed Overall Cognitive Status: Impaired/Different from baseline Area of Impairment: Memory;Problem solving                     Memory: Decreased short-term memory;Decreased recall of precautions       Problem Solving: Slow  processing General Comments: Pt is alert, oriented to self, place year and able to state president.  Did not recall correct month, states, "September".  Pt unable to recall attempts by nursing over the past few days to teach him how to manage his insulin.  States, "this is all new to me", reports he did not realize he had diabetes until coming to the hospital.  Able to recall general events surrounding his admission but not specifics.  Pt appeared anxious on arrival and states, "This is a lonely place, I want to get out of here and go home."   General Comments       Exercises     Shoulder Instructions      Home Living Family/patient expects to be discharged to:: Private residence Living Arrangements: Alone Available Help at Discharge: Available PRN/intermittently Type of Home: House Home Access: Stairs to enter [employer] of Steps: 3 Entrance Stairs-Rails: Left Home Layout: One level     Bathroom Shower/Tub: Walk-in shower;Door   [employer] Toilet: Standard     Home Equipment: None          Prior Functioning/Environment Level of Independence: Independent        Comments: Indep with ADLs, household and community mobilization; + driving; denies fall history.        OT Problem List: Decreased strength;Decreased cognition      OT Treatment/Interventions: Self-care/ADL training;Patient/family education;Cognitive remediation/compensation    OT Goals(Current goals can be found in the care plan section) Acute Rehab OT Goals Patient Stated Goal: to return home OT Goal Formulation: With patient Time For Goal Achievement: 08/10/20 Potential to Achieve Goals: Fair ADL Goals Additional ADL Goal #1: Pt will demonstrate cognitive strategies with medication management with supervision. Additional ADL Goal #2: Pt will demonstrate good safety awareness and judgement with home management tasks.  OT Frequency: Min 1X/week   Barriers to D/C:    Decreased memory and  ability for new learning regarding self management of insulin for diabetes       Co-evaluation              AM-PAC OT "6 Clicks" Daily Activity     Outcome Measure Help from another person eating meals?: None Help from another person taking care of personal grooming?: None Help from another person toileting, which includes using toliet, bedpan, or urinal?: None Help from another person bathing (including washing, rinsing, drying)?: None Help from another person to put on and taking off regular upper body clothing?: None Help from another person to put on and taking off regular lower body clothing?: None 6 Click Score: 24   End of  Session Equipment Utilized During Treatment: Gait belt  Activity Tolerance: Patient tolerated treatment well Patient left: in chair  OT Visit Diagnosis: Muscle weakness (generalized) (M62.81);Other symptoms and signs involving cognitive function                Time: 0915-1002 OT Time Calculation (min): 47 min Charges:  OT General Charges $OT Visit: 1 Visit OT Evaluation $OT Eval Low Complexity: 1 Low OT Treatments $Self Care/Home Management : 23-37 mins  Nafis Farnan T Nessie Nong, OTR/L, CLT   Rita Vialpando 07/27/2020, 10:23 AM

## 2020-07-28 LAB — GLUCOSE, CAPILLARY
Glucose-Capillary: 211 mg/dL — ABNORMAL HIGH (ref 70–99)
Glucose-Capillary: 188 mg/dL — ABNORMAL HIGH (ref 70–99)
Glucose-Capillary: 140 mg/dL — ABNORMAL HIGH (ref 70–99)
Glucose-Capillary: 236 mg/dL — ABNORMAL HIGH (ref 70–99)

## 2020-07-28 LAB — BASIC METABOLIC PANEL
Anion gap: 8 (ref 5–15)
BUN: 27 mg/dL — ABNORMAL HIGH (ref 8–23)
CO2: 28 mmol/L (ref 22–32)
Calcium: 8.9 mg/dL (ref 8.9–10.3)
Chloride: 103 mmol/L (ref 98–111)
Creatinine, Ser: 1.08 mg/dL (ref 0.61–1.24)
GFR, Estimated: >60 mL/min (ref >60)
Glucose, Bld: 298 mg/dL — ABNORMAL HIGH (ref 70–99)
Potassium: 4.8 mmol/L (ref 3.5–5.1)
Sodium: 139 mmol/L (ref 135–145)

## 2020-07-28 MED ORDER — INSULIN GLARGINE 100 UNIT/ML ~~LOC~~ SOLN
20.0000 [IU] | Freq: Every day | SUBCUTANEOUS | Status: DC
  Administered 2020-07-28 – 2020-07-29 (×2): 20 [IU] via SUBCUTANEOUS
  Filled 2020-07-28 (×3): qty 0.2

## 2020-07-28 MED ORDER — LACTATED RINGERS IV SOLN: INTRAVENOUS | Status: AC

## 2020-07-28 NOTE — Progress Notes (Signed)
PROGRESS NOTE    [name]  VEH:[id number] DOB: October 15, 1940 DOA: 07/20/2020 PCP: [name], MD   Chief Complaint  Patient presents with  . Altered Mental Status  Brief Narrative: 80 year old male with history of diabetes with stage III CKD, prostate cancer, dyslipidemia brought to the ED by EMS due to altered mental status.  Patient was seen in the ED found to have diabetic ketoacidosis acute metabolic encephalopathy pseudohyponatremia and was admitted for further management. Patient was optimized and subsequently transferred from insulin drip to subcu insulin Nursing and diabetic coordinator has been working diligently for insulin education teaching. High risk for readmission and DKA will at least need Levemir daily dosing along with oral hypoglycemic   Subjective: Seen and examined this morning he has no new complaints.  He is resting comfortably.    Assessment & Plan:  DKA POA.  Resolved.    T2DM with uncontrolled hyperglycemia,hemoglobin A1c 13.Not sure how safe is he to take insulin without supervision, high risk for DKA with ongoing hyperglycemia. He may be able to use Lantus pen so we will try on lantus- increased to 20 units, cont metformin 1000 twice daily, Actos.  Continue to work on insulin teaching- he may do better with PEN?.  DM coordinator following.  Social worker consulted, they have tried multiple facilities but it appears he may not qualify for skilled nursing facility.  I had extensive discussion with patient and patient's cousin and both of them have verbalized and understand plan of care.  We will continue to work on insulin education and teaching- if not able to do so may have to resort to maximal oral regimen and also weekly Trulicity. Recent Labs  Lab 07/26/20 2104 07/27/20 0748 07/27/20 1134 07/27/20 1600 07/27/20 2044  GLUCAP 220* 242* 176* 213* 836*   Acute metabolic encephalopathy in the setting of DKA.  He appears alert awake oriented, possible  reports at times mildly forgetful.   COPD:Stable not continue as needed bronchodilators. Hyponatremia: Pseudohyponatremia. Stable AKI/dehydration: Was placed on IV fluids 5/7 creatinine has improved cont IV fluids for overnight Recent Labs  Lab 07/24/20 0406 07/25/20 0504 07/26/20 0338 07/27/20 0501 07/28/20 0438  BUN 33* 29* 27* 32* 27*  CREATININE 1.05 1.08 0.95 1.32* 1.08   Diet Order            Diet Carb Modified           Diet Carb Modified Fluid consistency: Thin; Room service appropriate? Yes  Diet effective now               Patient's Body mass index is 26.96 kg/m. DVT prophylaxis: enoxaparin (LOVENOX) injection 40 mg Start: 07/20/20 2200 Code Status:   Code Status: Full Code  Family Communication: plan of care discussed with patient at bedside.  I have discussed with patient's cousin extensively as well.  Status is: Inpatient Remains inpatient appropriate because:Unsafe d/c plan and Inpatient level of care appropriate due to severity of illness  Dispo: The patient is from: Home              Anticipated d/c is to: SNF vs HHC              Patient currently is not medically stable to d/c.   Difficult to place patient No   Unresulted Labs (From admission, onward)          Start     Ordered   07/26/20 6294  Basic metabolic panel  Daily,   R  Question:  Specimen collection method  Answer:  Lab=Lab collect   07/25/20 1423        Medications reviewed:  Scheduled Meds: . allopurinol  300 mg Oral Daily  . aspirin EC  81 mg Oral Daily  . atorvastatin  80 mg Oral Daily  . bisoprolol  5 mg Oral Daily  . darifenacin  7.5 mg Oral Daily  . enoxaparin (LOVENOX) injection  40 mg Subcutaneous Q24H  . fluticasone furoate-vilanterol  1 puff Inhalation Daily   And  . umeclidinium bromide  1 puff Inhalation Daily  . insulin aspart  0-15 Units Subcutaneous TID WC  . insulin glargine  20 Units Subcutaneous Daily  . Ipratropium-Albuterol  1 puff Inhalation Q6H  .  loratadine  10 mg Oral Daily  . metFORMIN  1,000 mg Oral BID WC  . mirabegron ER  25 mg Oral Daily  . montelukast  10 mg Oral QHS  . multivitamin with minerals  1 tablet Oral Daily  . omega-3 acid ethyl esters  1 g Oral Daily  . pioglitazone  15 mg Oral Daily  . vitamin B-12  1,000 mcg Oral Daily   Continuous Infusions: . lactated ringers 75 mL/hr at 07/27/20 2300    Consultants:see note  Procedures:see note  Antimicrobials: Anti-infectives (From admission, onward)   None     Culture/Microbiology No results found for: SDES, SPECREQUEST, CULT, REPTSTATUS  Other culture-see note  Objective: Vitals: Today's Vitals   07/27/20 1604 07/27/20 2042 07/27/20 2117 07/28/20 0356  BP: (!) 92/54 (!) 110/54  110/68  Pulse: 74 84  73  Resp:  17  17  Temp:  98.2 F (36.8 C)  (!) 97.4 F (36.3 C)  TempSrc:  Oral  Oral  SpO2:  98%  96%  Weight:      Height:      PainSc:   0-No pain     Intake/Output Summary (Last 24 hours) at 07/28/2020 0722 Last data filed at 07/28/2020 0649 Gross per 24 hour  Intake 883.91 ml  Output 900 ml  Net -16.09 ml   Filed Weights   07/20/20 1208 07/21/20 0340  Weight: 74.8 kg 73.5 kg   Weight change:   Intake/Output from previous day: 05/07 0701 - 05/08 0700 In: 883.9 [P.O.:500; I.V.:383.9] Out: 900 [Urine:900] Intake/Output this shift: No intake/output data recorded. Filed Weights   07/20/20 1208 07/21/20 0340  Weight: 74.8 kg 73.5 kg    Examination: General exam: AAOx X3older than stated age, weak appearing. HEENT:Oral mucosa moist, Ear/Nose WNL grossly, dentition normal. Respiratory system: bilaterally diminished, no crackles,no use of accessory muscle Cardiovascular system: S1 & S2 +, No JVD,. Gastrointestinal system: Abdomen soft,obese NT,ND, BS+ Nervous System:Alert, awake, moving extremities and grossly nonfocal Extremities: no edema, distal peripheral pulses palpable.  Skin: No rashes,no icterus. MSK: Normal muscle bulk,tone,  power  Data Reviewed: I have personally reviewed following labs and imaging studies CBC: Recent Labs  Lab 07/22/20 0504 07/23/20 0433 07/24/20 0406 07/25/20 0504 07/26/20 0338  WBC 3.7* 3.4* 3.9* 4.7 4.2  HGB 13.4 13.1 13.3 13.1 12.4*  HCT 40.4 39.8 40.4 39.5 37.9*  MCV 91.6 91.7 91.2 91.2 92.7  PLT 156 140* 143* 175 0000000   Basic Metabolic Panel: Recent Labs  Lab 07/22/20 0504 07/23/20 0433 07/24/20 0406 07/25/20 0504 07/26/20 0338 07/27/20 0501 07/28/20 0438  NA 141 141 139 141 140 136 139  K 3.8 3.7 4.5 4.1 4.2 4.8 4.8  CL 108 107 105 106 104 101 103  CO2 28  28 28 29 28 28 28   GLUCOSE 100* 175* 284* 181* 198* 265* 298*  BUN 33* 25* 33* 29* 27* 32* 27*  CREATININE 1.19 0.88 1.05 1.08 0.95 1.32* 1.08  CALCIUM 9.1 9.0 9.0 9.0 8.8* 9.0 8.9  MG 2.1 1.9 1.9 1.9 1.8  --   --    GFR: Estimated Creatinine Clearance: 46.7 mL/min (by C-G formula based on SCr of 1.08 mg/dL). Liver Function Tests: No results for input(s): AST, ALT, ALKPHOS, BILITOT, PROT, ALBUMIN in the last 168 hours. No results for input(s): LIPASE, AMYLASE in the last 168 hours. No results for input(s): AMMONIA in the last 168 hours. Coagulation Profile: No results for input(s): INR, PROTIME in the last 168 hours. Cardiac Enzymes: No results for input(s): CKTOTAL, CKMB, CKMBINDEX, TROPONINI in the last 168 hours. BNP (last 3 results) No results for input(s): PROBNP in the last 8760 hours. HbA1C: No results for input(s): HGBA1C in the last 72 hours. CBG: Recent Labs  Lab 07/26/20 2104 07/27/20 0748 07/27/20 1134 07/27/20 1600 07/27/20 2044  GLUCAP 220* 242* 176* 213* 255*   Lipid Profile: No results for input(s): CHOL, HDL, LDLCALC, TRIG, CHOLHDL, LDLDIRECT in the last 72 hours. Thyroid Function Tests: No results for input(s): TSH, T4TOTAL, FREET4, T3FREE, THYROIDAB in the last 72 hours. Anemia Panel: No results for input(s): VITAMINB12, FOLATE, FERRITIN, TIBC, IRON, RETICCTPCT in the last 72  hours. Sepsis Labs: No results for input(s): PROCALCITON, LATICACIDVEN in the last 168 hours.  Recent Results (from the past 240 hour(s))  Resp Panel by RT-PCR (Flu A&B, Covid) Nasopharyngeal Swab     Status: None   Collection Time: 07/20/20  1:43 PM   Specimen: Nasopharyngeal Swab; Nasopharyngeal(NP) swabs in vial transport medium  Result Value Ref Range Status   SARS Coronavirus 2 by RT PCR NEGATIVE NEGATIVE Final    Comment: (NOTE) SARS-CoV-2 target nucleic acids are NOT DETECTED.  The SARS-CoV-2 RNA is generally detectable in upper respiratory specimens during the acute phase of infection. The lowest concentration of SARS-CoV-2 viral copies this assay can detect is 138 copies/mL. A negative result does not preclude SARS-Cov-2 infection and should not be used as the sole basis for treatment or other patient management decisions. A negative result may occur with  improper specimen collection/handling, submission of specimen other than nasopharyngeal swab, presence of viral mutation(s) within the areas targeted by this assay, and inadequate number of viral copies(<138 copies/mL). A negative result must be combined with clinical observations, patient history, and epidemiological information. The expected result is Negative.  Fact Sheet for Patients:  EntrepreneurPulse.com.au  Fact Sheet for Healthcare Providers:  IncredibleEmployment.be  This test is no t yet approved or cleared by the [place] FDA and  has been authorized for detection and/or diagnosis of SARS-CoV-2 by FDA under an Emergency Use Authorization (EUA). This EUA will remain  in effect (meaning this test can be used) for the duration of the COVID-19 declaration under Section 564(b)(1) of the Act, 21 U.S.C.section 360bbb-3(b)(1), unless the authorization is terminated  or revoked sooner.       Influenza A by PCR NEGATIVE NEGATIVE Final   Influenza B by PCR NEGATIVE  NEGATIVE Final    Comment: (NOTE) The Xpert Xpress SARS-CoV-2/FLU/RSV plus assay is intended as an aid in the diagnosis of influenza from Nasopharyngeal swab specimens and should not be used as a sole basis for treatment. Nasal washings and aspirates are unacceptable for Xpert Xpress SARS-CoV-2/FLU/RSV testing.  Fact Sheet for Patients: EntrepreneurPulse.com.au  Fact Sheet  for Healthcare Providers: IncredibleEmployment.be  This test is not yet approved or cleared by the [place] and has been authorized for detection and/or diagnosis of SARS-CoV-2 by FDA under an Emergency Use Authorization (EUA). This EUA will remain in effect (meaning this test can be used) for the duration of the COVID-19 declaration under Section 564(b)(1) of the Act, 21 U.S.C. section 360bbb-3(b)(1), unless the authorization is terminated or revoked.  Performed at [facility], [address] [place], [place] [address]      Radiology Studies: No results found.   LOS: 8 days   Antonieta Pert, MD [facility]  07/28/2020, 7:22 AM

## 2020-07-28 NOTE — TOC Progression Note (Addendum)
Transition of Care (TOC) - Progression Note    Patient Details  Name: Allen Hunter. MRN: [id number] Date of Birth: [birth year]  Transition of Care [facility]) CM/SW [place], LCSW Phone Number: 07/28/2020, 10:34 AM  Clinical Narrative:   [facility] consult asking if patient qualifies for SNF since he is having trouble administering his own insulin. CSW checked with other TOC members as well as SNF Admissions and they reported this is not a skillable need. Unfortunately, this would not qualify patient for SNF. Home Health RN can help with teaching at home.   Attending MD and RN notified.    Expected Discharge Plan: Home/Self Care Barriers to Discharge: Barriers Resolved  Expected Discharge Plan and Services Expected Discharge Plan: Home/Self Care       Living arrangements for the past 2 months: Single Family Home Expected Discharge Date: 07/26/20               DME Arranged: Gilford Rile rolling DME Agency: [employer] Date DME Agency Contacted: 07/23/20 Time DME Agency Contacted: 1250 Representative spoke with at DME Agency: [place]: RN,PT,OT,Nurse's [place]: [facility] Date [place]: 07/23/20 Time [place]: 1250 Representative spoke with at [place]: [place]   Social Determinants of Health ([place]) Interventions    Readmission Risk Interventions No flowsheet data found.

## 2020-07-29 LAB — GLUCOSE, CAPILLARY
Glucose-Capillary: 125 mg/dL — ABNORMAL HIGH (ref 70–99)
Glucose-Capillary: 198 mg/dL — ABNORMAL HIGH (ref 70–99)
Glucose-Capillary: 135 mg/dL — ABNORMAL HIGH (ref 70–99)

## 2020-07-29 LAB — BASIC METABOLIC PANEL
Anion gap: 7 (ref 5–15)
BUN: 22 mg/dL (ref 8–23)
CO2: 29 mmol/L (ref 22–32)
Calcium: 9 mg/dL (ref 8.9–10.3)
Chloride: 103 mmol/L (ref 98–111)
Creatinine, Ser: 1.09 mg/dL (ref 0.61–1.24)
GFR, Estimated: >60 mL/min (ref >60)
Glucose, Bld: 137 mg/dL — ABNORMAL HIGH (ref 70–99)
Potassium: 4.1 mmol/L (ref 3.5–5.1)
Sodium: 139 mmol/L (ref 135–145)

## 2020-07-29 MED ORDER — INSULIN PEN NEEDLE 32G X 4 MM MISC: 1.0000 | Freq: Every day | 0 refills | Status: AC

## 2020-07-29 MED ORDER — LIVING WELL WITH DIABETES BOOK
Freq: Once | Status: AC
  Administered 2020-07-29: 1
  Filled 2020-07-29: qty 1

## 2020-07-29 MED ORDER — INSULIN GLARGINE 100 UNIT/ML SOLOSTAR PEN: 20.0000 [IU] | PEN_INJECTOR | Freq: Every day | SUBCUTANEOUS | 0 refills | Status: DC

## 2020-07-29 NOTE — Progress Notes (Signed)
Inpatient Diabetes Program Recommendations  AACE/ADA: New Consensus Statement on Inpatient Glycemic Control   Target Ranges:  Prepandial:   less than 140 mg/dL      Peak postprandial:   less than 180 mg/dL (1-2 hours)      Critically ill patients:  140 - 180 mg/dL   Results for Allen Hunter, Allen Hunter (MRN [id number]) as of 07/29/2020 11:09  Ref. Range 07/28/2020 07:37 07/28/2020 12:09 07/28/2020 16:16 07/28/2020 21:59 07/29/2020 08:22  Glucose-Capillary Latest Ref Range: 70 - 99 mg/dL 211 (H) 188 (H) 140 (H) 236 (H) 125 (H)   Review of Glycemic Control  Diabetes history: DM2 Outpatient Diabetes medications: Trulicity (not taking), Jardiance 25 mg daily, Metformin 500 mg BID, Glipizide XL 10 mg QAM Current orders for Inpatient glycemic control: Lantus 20 units daily, Novolog 0-15 units TID with meals, Metformin 1000 mg BID, Actos 15 mg daily  NOTE: Noted consult for diabetes coordinator and RN paged our team about talking with patient and family. Inpatient diabetes team talked with patient and family several times last week. Spoke with patient and Allen Hunter. Allen Hunter (cousin) at bedside. They both state that a friend "Allen Hunter" is going to be coming by daily to administer Lantus insulin to patient. Allen Hunter called Allen Hunter on her cell phone and I was able to speak with [facility]. 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 Allen Hunter reports that she was told that patient will be prescribed insulin pens and she again states she is able to use insulin pens and she will commit to going by daily to administer the Lantus. After speaking with Allen Hunter, talked with patient and Allen Hunter about insulin pens and demonstrated how to use an insulin pen. Both patient and Allen Hunter said they did not really need to know about the insulin pen since [place] knew how to use and she would be coming to give him insulin everyday.  Discussed importance of taking all DM medications as prescribed and encouraged patient to ask pharmacy about prepackaging medications (such as in blister packs) to make it easier for patient to remember to take medications. Patient and Allen Hunter state that they will discuss with pharmacy. Patient reports that he checks his own glucose at home and he will continue to do so. Encouraged patient to be sure to take glucometer with him to follow up appointments so the provider can use the values to make adjustments with DM medications if needed. Discussed carb modified diet and importance of eating 3 meals per day. Patient states he drinks only water (states he does not have anything but water to drink in his house) and he does not use any regular sugar (does not buy sugar at all). Encouraged patient to eat carbohydrates in moderation. Patient asked if someone could come out to his house for a few weeks to follow behind him and make sure he was doing everything right. Informed patient that I would send communication to [facility] about request and they would talk with him about it. Patient and Allen Hunter verbalized understanding of information discussed and they state they have no questions at this time related to DM.  Thanks, Barnie Alderman, RN, MSN, CDE Diabetes Coordinator Inpatient Diabetes Program [phone] (Team Pager from 8am to 5pm)

## 2020-07-29 NOTE — Care Management Important Message (Signed)
Important Message  Patient Details  Name: Allen Hunter. MRN: [id number] Date of Birth: 05-Dec-1940   Medicare Important Message Given:  Yes     Juliann Pulse A Katharin Schneider 07/29/2020, 3:36 PM

## 2020-07-29 NOTE — TOC Transition Note (Addendum)
Transition of Care [facility]) - CM/SW Discharge Note   Patient Details  Name: Allen Hunter. MRN: [id number] Date of Birth: Jun 29, 1940  Transition of Care [facility]) CM/SW Contact:  Magnus Ivan, LCSW Phone Number: 07/29/2020, 2:38 PM   Clinical Narrative:   Per MD patient will discharge home today. CSW notified [place] with [facility] of discharge. RW ordered by [facility] during this admission for home use.   Requested benefits check for insulin per MD request.   TOC consult asking if PCP appointment can be moved to later in the week. Asked Nurse Secretary Cyril Mourning to follow up.   No additional TOC needs.    Final next level of care: [place] Barriers to Discharge: Barriers Resolved   Patient Goals and CMS Choice Patient states their goals for this hospitalization and ongoing recovery are:: home with home health and family support CMS Medicare.gov Compare Post Acute Care list provided to:: Patient Choice offered to / list presented to : Patient  Discharge Placement                Patient to be transferred to facility by: family Name of family member notified: patient aware Patient and family notified of of transfer: 07/29/20  Discharge Plan and Services                DME Arranged: Gilford Rile rolling DME Agency: [employer] Date DME Agency Contacted: 07/23/20 Time DME Agency Contacted: 1250 Representative spoke with at DME Agency: [place]: RN,PT,OT,Nurse's [place]: [facility] Date [place]: 07/29/20 Time [place]: 1250 Representative spoke with at [place]: [place] ([place]) Interventions     Readmission Risk Interventions No flowsheet data found.

## 2020-07-29 NOTE — Progress Notes (Signed)
Occupational Therapy Treatment Patient Details Name: Allen Hunter. MRN: [id number] DOB: October 30, 1940 Today's Date: 07/29/2020    History of present illness presented to ER secondary to AMS; admitted for management of DKA   OT comments  Allen Hunter continues to present with impaired cognition and limited safety awareness that impacts his ability to safely and independently engage in self care tasks.  Pt was pleasant and agreeable to today's session focused on education re: self care, medication management, meal prep, and compensatory strategies for functional cognition.  OT provided education re: diabetes management as it relates to daily routines and self care.  OT provided coaching re: meal prep, daily routines, mediation management, and diabetes care.  Pt engaged throughout session, but presents with short term memory impairments and poor safety awareness.  OT provided moderate verbal assist to create basic daily routine to promote medication management, meal prep, ADLs, and diabetes considerations.  Pt presents with limited insight into effects of poor diabetes care and responsibilities/roles required for medication management.  OT provided education re: compensatory strategies to promote adherence to medication management, including reminders on phone, signs throughout home, adhering to daily routine, and keeping equipment in visible area.  Pt verbalized understanding of education provided, but will likely benefit from additional follow-up re: medication management and functional cognition.  Pt will benefit from frequent supervision after discharge to ensure medication adherence and safety in self-care tasks.  He will continue to benefit from skilled OT services in acute setting to address functional cognition and safety and independence in self care tasks.    Follow Up Recommendations  Supervision/Assistance - 24 hour    Equipment Recommendations       Recommendations for Other Services       Precautions / Restrictions Precautions Precautions: Fall Restrictions Weight Bearing Restrictions: No       Mobility Bed Mobility Overal bed mobility: Modified Independent                  Transfers Overall transfer level: Needs assistance Equipment used: None Transfers: Sit to/from Stand Sit to Stand: Supervision              Balance Overall balance assessment: Needs assistance Sitting-balance support: No upper extremity supported;Feet supported Sitting balance-Leahy Scale: Good     Standing balance support: No upper extremity supported Standing balance-Leahy Scale: Good                             ADL either performed or assessed with clinical judgement   ADL Overall ADL's : Modified independent                                       General ADL Comments: Pt is able to complete basic self care tasks with modified independence, but demonstrates short term memory impairments that limit safety in home and medication management tasks, also presents higher risk for falls and home safety hazards.     Vision Baseline Vision/History: Wears glasses Patient Visual Report: No change from baseline     Perception     Praxis      Cognition Arousal/Alertness: Awake/alert Behavior During Therapy: WFL for tasks assessed/performed Overall Cognitive Status: Impaired/Different from baseline Area of Impairment: Memory;Problem solving                     Memory: Decreased  short-term memory;Decreased recall of precautions       Problem Solving: Slow processing General Comments: Pt able to engage in conversation and grossly oriented, but has limited insight into safety awareness or self care needs after discharge.  Short term memory deficits observed throughout conversation, pt repeatedly stating he had not had much education on diabetes management though there are several documented instances of education he has been provided.         Exercises Other Exercises Other Exercises: provided education and coaching re: daily routines, self-care roles, functional cognition strategies to promote adherence to diabetes management   Shoulder Instructions       General Comments      Pertinent Vitals/ Pain       Pain Assessment: No/denies pain  Home Living                                          Prior Functioning/Environment              Frequency  Min 1X/week        Progress Toward Goals  OT Goals(current goals can now be found in the care plan section)  Progress towards OT goals: Progressing toward goals  Acute Rehab OT Goals Patient Stated Goal: to return home OT Goal Formulation: With patient Time For Goal Achievement: 08/10/20 Potential to Achieve Goals: [place] Discharge plan remains appropriate;Frequency remains appropriate    Co-evaluation                 AM-PAC OT "6 Clicks" Daily Activity     Outcome Measure   Help from another person eating meals?: A Little (requires additional education and verbal cues for carb controlled diet) Help from another person taking care of personal grooming?: None Help from another person toileting, which includes using toliet, bedpan, or urinal?: None Help from another person bathing (including washing, rinsing, drying)?: None Help from another person to put on and taking off regular upper body clothing?: None Help from another person to put on and taking off regular lower body clothing?: None 6 Click Score: 23    End of Session    OT Visit Diagnosis: Muscle weakness (generalized) (M62.81);Other symptoms and signs involving cognitive function   Activity Tolerance Patient tolerated treatment well   Patient Left in bed;with family/visitor present;with call bell/phone within reach   Nurse Communication          Time: 1100-1144 OT Time Calculation (min): 44 min  Charges: OT General Charges $OT Visit: 1 Visit OT  Treatments $Self Care/Home Management : 23-37 mins $Cognitive Funtion inital: Initial 15 mins   Allen Hunter, OTR/L 07/29/20, 12:05 PM

## 2020-07-29 NOTE — TOC Benefit Eligibility Note (Signed)
Transition of Care [facility]) Benefit Eligibility Note    Patient Details  Name: Allen Hunter. MRN: [id number] Date of Birth: [birth year]   Medication/Dose: Lantus Solostar pen  Covered?: Yes   Prescription Coverage Preferred Pharmacy: [place] with Person/Company/Phone Number:: Mitzi Hansen with [facility] Medicare [phone]  Co-Pay: $35 estimated cost for 1 pen retail  Prior Approval: No  Deductible:  (Rep unable to disclose deductible and accumulations.)   Dannette Barbara Phone Number: [phone] or [phone] 07/29/2020, 4:35 PM

## 2020-07-29 NOTE — Discharge Summary (Signed)
Physician Discharge Summary  Allen Hunter. XBL:[id number] DOB: November 18, 1940 DOA: 07/20/2020  PCP: [name], MD  Admit date: 07/20/2020 Discharge date: 07/29/2020  Admitted From: home Disposition:  hh  Recommendations for Outpatient Follow-up:  1. Follow up with PCP w/i 1 week  Home Health:yes  Equipment/Devices: yes  Discharge Condition: Stable Code Status:   Code Status: Full Code Diet recommendation:  Diet Order            Diet Carb Modified           Diet Carb Modified Fluid consistency: Thin; Room service appropriate? Yes  Diet effective now                  Brief/Interim Summary: 80 year old male with history of diabetes with stage III CKD, prostate cancer, dyslipidemia brought to the ED by EMS due to altered mental status. Patient was seen in the ED found to have diabetic ketoacidosis acute metabolic encephalopathy pseudohyponatremia and was admitted for further management. Patient was optimized and subsequently transferred from insulin drip to subcu insulin Nursing and diabetic coordinator has been working diligently for insulin education teaching. High risk for readmission and DKA will at least need Levemir daily dosing along with oral meds. He did not qualify for a skilled nursing facility. Seen by nursing staff on diabetes coordinator multiple times for insulin teaching. Patient's cousin and friend came at the bedside for and has agreed to do insulin injection every day for the patient and home health care is also being set up and causing we will do the pill box.  Patient wanting to go home today he is alert awake oriented x3 but at times appears noncompliant with diet was extensively given dietary education. We discussed about high risk of readmission if he continues to remain noncompliant.  He understands and family understands overall situation and need for close follow-up w/ PCP, diabetes educator Home health. We will have a PCP follow-up soon to adjust his  insulin regimen.  He is to check blood sugar 4 times a day and he understands.  Discharge Diagnoses:  T2DMwith uncontrolled hyperglycemia,hemoglobin A1c 13.after extensive discussion and education arrangements have been made for patient to get insulin injection with Lantus, his cousin and friends are helping him, home health is also being set up.  Cont Lantus at 20 units, metformin. Recent Labs  Lab 07/28/20 1209 07/28/20 1616 07/28/20 2159 07/29/20 0822 07/29/20 1125  GLUCAP 188* 140* 236* 125* 300*   Acute metabolic encephalopathy in the setting of DKA.  He appears alert awake oriented, possible reports at times mildly forgetful.   COPD:Stable not continue as needed bronchodilators. Hyponatremia: Pseudohyponatremia. Stable AKI/dehydration: Was placed on IV fluids 5/7 creatinine has improved .stop IV fluids  Recent Labs  Lab 07/25/20 0504 07/26/20 0338 07/27/20 0501 07/28/20 0438 07/29/20 0321  BUN 29* 27* 32* 27* 22  CREATININE 1.08 0.95 1.32* 1.08 1.09    Consults:  Diabetes coordinator  Subjective: Patient is alert awake, resting comfortably, cousin and friend is at the bedside.  Discharge Exam: Vitals:   07/29/20 0437 07/29/20 0700  BP: 125/73 123/77  Pulse: 70 72  Resp:    Temp: 98 F (36.7 C) 98 F (36.7 C)  SpO2: 98% 98%   General: Pt is alert, awake, not in acute distress Cardiovascular: RRR, S1/S2 +, no rubs, no gallops Respiratory: CTA bilaterally, no wheezing, no rhonchi Abdominal: Soft, NT, ND, bowel sounds + Extremities: no edema, no cyanosis  Discharge Instructions  Discharge Instructions  Diet Carb Modified   Complete by: As directed    Discharge instructions   Complete by: As directed    Check blood sugar 3 times a day and bedtime at home. If blood sugar running above 200 less than 70 please call your MD to adjust medications If blood sugars running less 100 do not use insulin and call MD. If you noticed signs and symptoms of  hypoglycemia or low blood sugar like jitteriness, confusion, thirst, tremor, sweating- Check blood sugar, drink sugary drink/biscuits/sweets to increase sugar level and call MD or return to ER.   Please call call MD or return to ER for similar or worsening recurring problem that brought you to hospital or if any fever,nausea/vomiting,abdominal pain, uncontrolled pain, chest pain,  shortness of breath or any other alarming symptoms.  Please follow-up your doctor as instructed on 07/30/2020 at 2.20 pm-please call as soon as possible the office after discharge.  Please avoid alcohol, smoking, or any other illicit substance and maintain healthy habits including taking your regular medications as prescribed.  You were cared for by a hospitalist during your hospital stay. If you have any questions about your discharge medications or the care you received while you were in the hospital after you are discharged, you can call the unit and ask to speak with the hospitalist on call if the hospitalist that took care of you is not available.  Once you are discharged, your primary care physician will handle any further medical issues. Please note that NO REFILLS for any discharge medications will be authorized once you are discharged, as it is imperative that you return to your primary care physician (or establish a relationship with a primary care physician if you do not have one) for your aftercare needs so that they can reassess your need for medications and monitor your lab values.   Increase activity slowly   Complete by: As directed    Increase activity slowly   Complete by: As directed      Allergies as of 07/29/2020   No Known Allergies     Medication List    STOP taking these medications   azelastine 0.1 % nasal spray Commonly known as: ASTELIN   cetirizine 10 MG tablet Commonly known as: ZYRTEC   Combivent Respimat 20-100 MCG/ACT Aers respimat Generic drug: Ipratropium-Albuterol    glipiZIDE 10 MG 24 hr tablet Commonly known as: GLUCOTROL XL   Jardiance 25 MG Tabs tablet Generic drug: empagliflozin   Lactulose 20 ZO/10RU Soln   Trulicity 0.45 WU/9.8JX Sopn Generic drug: Dulaglutide     TAKE these medications   allopurinol 300 MG tablet Commonly known as: ZYLOPRIM TAKE 1 TABLET DAILY   aspirin 81 MG tablet Take 81 mg by mouth daily.   atorvastatin 80 MG tablet Commonly known as: LIPITOR TAKE 1 TABLET DAILY   bisoprolol 5 MG tablet Commonly known as: ZEBETA TAKE 1 TABLET DAILY   blood glucose meter kit and supplies Dispense based on patient and insurance preference. Use up to four times daily as directed. (FOR ICD-10 E10.9, E11.9).   CENTRUM MEN PO Take 1 tablet by mouth daily.   esomeprazole 40 MG capsule Commonly known as: NEXIUM TAKE 1 CAPSULE DAILY   Fish Oil 500 MG Caps Take by mouth daily.   fluticasone 50 MCG/ACT nasal spray Commonly known as: FLONASE Place 1 spray into both nostrils daily.   Fluticasone-Umeclidin-Vilant 100-62.5-25 MCG/INH Aepb Commonly known as: Trelegy Ellipta Inhale 1 puff into the lungs daily.  FREESTYLE LITE test strip Generic drug: glucose blood USE 1 STRIP TWICE A DAY AS NEEDED TO CHECK SUGAR   insulin glargine 100 UNIT/ML Solostar Pen Commonly known as: LANTUS Inject 20 Units into the skin daily.   Insulin Pen Needle 32G X 4 MM Misc 1 each by Does not apply route daily at 6 (six) AM for 100 doses.   Lancets Misc Use asd 1 per day 250.02   metFORMIN 1000 MG tablet Commonly known as: GLUCOPHAGE Take 1 tablet (1,000 mg total) by mouth 2 (two) times daily with a meal. What changed: See the new instructions.   mirabegron ER 25 MG Tb24 tablet Commonly known as: Myrbetriq Take 1 tablet (25 mg total) by mouth daily.   montelukast 10 MG tablet Commonly known as: SINGULAIR TAKE 1 TABLET (10MG TOTAL ) BY MOUTH AT BEDTIME What changed: See the new instructions.   sildenafil 100 MG  tablet Commonly known as: VIAGRA Take one tablet by mouth daily as needed   tamsulosin 0.4 MG Caps capsule Commonly known as: Flomax Take 1 capsule (0.4 mg total) by mouth daily.   triamcinolone cream 0.1 % Commonly known as: KENALOG Apply topically 2 (two) times daily as needed (itching).   trospium 20 MG tablet Commonly known as: SANCTURA Take 20 mg by mouth at bedtime.   vitamin B-12 1000 MCG tablet Commonly known as: CYANOCOBALAMIN Take 1,000 mcg by mouth daily.            Durable Medical Equipment  (From admission, onward)         Start     Ordered   07/23/20 1252  For home use only DME Walker rolling  Once       Question Answer Comment  Walker: With 5 Inch Wheels   Patient needs a walker to treat with the following condition Weakness      07/23/20 1251          Follow-up Information    [name], MD. Schedule an appointment as soon as possible for a visit on 07/30/2020.   Specialties: Internal Medicine, Radiology Why: '@2' :20pm Contact information: [place] [address] [phone]        Nahser, Wonda Cheng, MD.   Specialty: Cardiology Contact information: [place] [address] [phone]              No Known Allergies  The results of significant diagnostics from this hospitalization (including imaging, microbiology, ancillary and laboratory) are listed below for reference.    Microbiology: Recent Results (from the past 240 hour(s))  Resp Panel by RT-PCR (Flu A&B, Covid) Nasopharyngeal Swab     Status: None   Collection Time: 07/20/20  1:43 PM   Specimen: Nasopharyngeal Swab; Nasopharyngeal(NP) swabs in vial transport medium  Result Value Ref Range Status   SARS Coronavirus 2 by RT PCR NEGATIVE NEGATIVE Final    Comment: (NOTE) SARS-CoV-2 target nucleic acids are NOT DETECTED.  The SARS-CoV-2 RNA is generally detectable in upper respiratory specimens during the acute phase of  infection. The lowest concentration of SARS-CoV-2 viral copies this assay can detect is 138 copies/mL. A negative result does not preclude SARS-Cov-2 infection and should not be used as the sole basis for treatment or other patient management decisions. A negative result may occur with  improper specimen collection/handling, submission of specimen other than nasopharyngeal swab, presence of viral mutation(s) within the areas targeted by this assay, and inadequate number of viral copies(<138  copies/mL). A negative result must be combined with clinical observations, patient history, and epidemiological information. The expected result is Negative.  Fact Sheet for Patients:  EntrepreneurPulse.com.au  Fact Sheet for Healthcare Providers:  IncredibleEmployment.be  This test is no t yet approved or cleared by the [place] FDA and  has been authorized for detection and/or diagnosis of SARS-CoV-2 by FDA under an Emergency Use Authorization (EUA). This EUA will remain  in effect (meaning this test can be used) for the duration of the COVID-19 declaration under Section 564(b)(1) of the Act, 21 U.S.C.section 360bbb-3(b)(1), unless the authorization is terminated  or revoked sooner.       Influenza A by PCR NEGATIVE NEGATIVE Final   Influenza B by PCR NEGATIVE NEGATIVE Final    Comment: (NOTE) The Xpert Xpress SARS-CoV-2/FLU/RSV plus assay is intended as an aid in the diagnosis of influenza from Nasopharyngeal swab specimens and should not be used as a sole basis for treatment. Nasal washings and aspirates are unacceptable for Xpert Xpress SARS-CoV-2/FLU/RSV testing.  Fact Sheet for Patients: EntrepreneurPulse.com.au  Fact Sheet for Healthcare Providers: IncredibleEmployment.be  This test is not yet approved or cleared by the [place] FDA and has been authorized for detection and/or diagnosis of SARS-CoV-2  by FDA under an Emergency Use Authorization (EUA). This EUA will remain in effect (meaning this test can be used) for the duration of the COVID-19 declaration under Section 564(b)(1) of the Act, 21 U.S.C. section 360bbb-3(b)(1), unless the authorization is terminated or revoked.  Performed at [facility], [address] [place], [place] [address]     Procedures/Studies: DG Chest 2 View  Result Date: 07/20/2020 CLINICAL DATA:  Altered mental status EXAM: CHEST - 2 VIEW COMPARISON:  10/20/2007 FINDINGS: The heart size and mediastinal contours are within normal limits. Both lungs are clear. The visualized skeletal structures are unremarkable. IMPRESSION: No active cardiopulmonary disease. Electronically Signed   By: Kathreen Devoid   On: 07/20/2020 13:11   CT Head Wo Contrast  Result Date: 07/20/2020 CLINICAL DATA:  80 year old male with altered mental status. EXAM: CT HEAD WITHOUT CONTRAST TECHNIQUE: Contiguous axial images were obtained from the base of the skull through the vertex without intravenous contrast. COMPARISON:  None. FINDINGS: Brain: No evidence of acute infarction, hemorrhage, hydrocephalus, extra-axial collection or mass lesion/mass effect. Mild generalized cerebral volume loss and mild probable chronic small-vessel white matter ischemic changes are noted. Vascular: Carotid atherosclerotic calcifications are noted. Skull: Normal. Negative for fracture or focal lesion. Sinuses/Orbits: No acute finding. Other: A 2.5 cm posterior RIGHT scalp lesion most likely represents a sebaceous cyst. Correlate clinically. IMPRESSION: 1. No evidence of acute intracranial abnormality. 2. Mild generalized cerebral volume loss and mild probable chronic small-vessel white matter ischemic changes. 3. 2.5 cm posterior RIGHT scalp lesion most likely a sebaceous cyst. Correlate clinically. Electronically Signed   By: Margarette Canada M.D.   On: 07/20/2020 13:16    Labs: BNP (last 3 results) No  results for input(s): BNP in the last 8760 hours. Basic Metabolic Panel: Recent Labs  Lab 07/23/20 0433 07/24/20 0406 07/25/20 0504 07/26/20 0338 07/27/20 0501 07/28/20 0438 07/29/20 0321  NA 141 139 141 140 136 139 139  K 3.7 4.5 4.1 4.2 4.8 4.8 4.1  CL 107 105 106 104 101 103 103  CO2 '28 28 29 28 28 28 29  ' GLUCOSE 175* 284* 181* 198* 265* 298* 137*  BUN 25* 33* 29* 27* 32* 27* 22  CREATININE 0.88 1.05 1.08 0.95 1.32* 1.08  1.09  CALCIUM 9.0 9.0 9.0 8.8* 9.0 8.9 9.0  MG 1.9 1.9 1.9 1.8  --   --   --    Liver Function Tests: No results for input(s): AST, ALT, ALKPHOS, BILITOT, PROT, ALBUMIN in the last 168 hours. No results for input(s): LIPASE, AMYLASE in the last 168 hours. No results for input(s): AMMONIA in the last 168 hours. CBC: Recent Labs  Lab 07/23/20 0433 07/24/20 0406 07/25/20 0504 07/26/20 0338  WBC 3.4* 3.9* 4.7 4.2  HGB 13.1 13.3 13.1 12.4*  HCT 39.8 40.4 39.5 37.9*  MCV 91.7 91.2 91.2 92.7  PLT 140* 143* 175 176   Cardiac Enzymes: No results for input(s): CKTOTAL, CKMB, CKMBINDEX, TROPONINI in the last 168 hours. BNP: Invalid input(s): POCBNP CBG: Recent Labs  Lab 07/28/20 1209 07/28/20 1616 07/28/20 2159 07/29/20 0822 07/29/20 1125  GLUCAP 188* 140* 236* 125* 198*   D-Dimer No results for input(s): DDIMER in the last 72 hours. Hgb A1c No results for input(s): HGBA1C in the last 72 hours. Lipid Profile No results for input(s): CHOL, HDL, LDLCALC, TRIG, CHOLHDL, LDLDIRECT in the last 72 hours. Thyroid function studies No results for input(s): TSH, T4TOTAL, T3FREE, THYROIDAB in the last 72 hours.  Invalid input(s): FREET3 Anemia work up No results for input(s): VITAMINB12, FOLATE, FERRITIN, TIBC, IRON, RETICCTPCT in the last 72 hours. Urinalysis    Component Value Date/Time   COLORURINE YELLOW (A) 07/20/2020 1349   APPEARANCEUR CLEAR (A) 07/20/2020 1349   LABSPEC 1.022 07/20/2020 1349   PHURINE 5.0 07/20/2020 1349   GLUCOSEU >=500  (A) 07/20/2020 1349   GLUCOSEU >=1000 (A) 05/10/2019 1454   HGBUR SMALL (A) 07/20/2020 1349   BILIRUBINUR NEGATIVE 07/20/2020 1349   KETONESUR 80 (A) 07/20/2020 1349   PROTEINUR NEGATIVE 07/20/2020 1349   UROBILINOGEN 0.2 05/10/2019 1454   NITRITE NEGATIVE 07/20/2020 1349   LEUKOCYTESUR NEGATIVE 07/20/2020 1349   Sepsis Labs Invalid input(s): PROCALCITONIN,  WBC,  LACTICIDVEN Microbiology Recent Results (from the past 240 hour(s))  Resp Panel by RT-PCR (Flu A&B, Covid) Nasopharyngeal Swab     Status: None   Collection Time: 07/20/20  1:43 PM   Specimen: Nasopharyngeal Swab; Nasopharyngeal(NP) swabs in vial transport medium  Result Value Ref Range Status   SARS Coronavirus 2 by RT PCR NEGATIVE NEGATIVE Final    Comment: (NOTE) SARS-CoV-2 target nucleic acids are NOT DETECTED.  The SARS-CoV-2 RNA is generally detectable in upper respiratory specimens during the acute phase of infection. The lowest concentration of SARS-CoV-2 viral copies this assay can detect is 138 copies/mL. A negative result does not preclude SARS-Cov-2 infection and should not be used as the sole basis for treatment or other patient management decisions. A negative result may occur with  improper specimen collection/handling, submission of specimen other than nasopharyngeal swab, presence of viral mutation(s) within the areas targeted by this assay, and inadequate number of viral copies(<138 copies/mL). A negative result must be combined with clinical observations, patient history, and epidemiological information. The expected result is Negative.  Fact Sheet for Patients:  EntrepreneurPulse.com.au  Fact Sheet for Healthcare Providers:  IncredibleEmployment.be  This test is no t yet approved or cleared by the [place] FDA and  has been authorized for detection and/or diagnosis of SARS-CoV-2 by FDA under an Emergency Use Authorization (EUA). This EUA will remain  in  effect (meaning this test can be used) for the duration of the COVID-19 declaration under Section 564(b)(1) of the Act, 21 U.S.C.section 360bbb-3(b)(1), unless the authorization is terminated  or revoked sooner.       Influenza A by PCR NEGATIVE NEGATIVE Final   Influenza B by PCR NEGATIVE NEGATIVE Final    Comment: (NOTE) The Xpert Xpress SARS-CoV-2/FLU/RSV plus assay is intended as an aid in the diagnosis of influenza from Nasopharyngeal swab specimens and should not be used as a sole basis for treatment. Nasal washings and aspirates are unacceptable for Xpert Xpress SARS-CoV-2/FLU/RSV testing.  Fact Sheet for Patients: EntrepreneurPulse.com.au  Fact Sheet for Healthcare Providers: IncredibleEmployment.be  This test is not yet approved or cleared by the [place] FDA and has been authorized for detection and/or diagnosis of SARS-CoV-2 by FDA under an Emergency Use Authorization (EUA). This EUA will remain in effect (meaning this test can be used) for the duration of the COVID-19 declaration under Section 564(b)(1) of the Act, 21 U.S.C. section 360bbb-3(b)(1), unless the authorization is terminated or revoked.  Performed at [facility], [address], [place], [place] [address]      Time coordinating discharge: 35 minutes  SIGNED: Antonieta Pert, MD  [facility] 07/29/2020, 2:28 PM  If 7PM-7AM, please contact night-coverage [email or web address]

## 2020-07-29 NOTE — TOC Progression Note (Addendum)
Transition of Care (TOC) - Progression Note    Patient Details  Name: Allen Hunter. MRN: [id number] Date of Birth: 08-Dec-1940  Transition of Care [facility]) CM/SW [place], LCSW Phone Number: 07/29/2020, 11:24 AM  Clinical Narrative:     Per RN and Diabetes Coordinator, patient asking about if his family member Stanton Kidney) can provide home health services to him in the home to help with diabetes management. Patient reported Stanton Kidney has her own home health agency.   CSW spoke to patient and family member Mae was at bedside. They reported Stanton Kidney owns a group home ([facility]). Patient reported he is not moving to the group home, that [place] to come by regularly to patient's home to check on him and make sure he is managing his diabetes. Informed him the home health had also been arranged through [facility], that group homes are not home health agencies. Patient verbalized understanding. Patient plans to have support through [facility] as well as family member Carrizo Hill.    Expected Discharge Plan: Home/Self Care Barriers to Discharge: Barriers Resolved  Expected Discharge Plan and Services Expected Discharge Plan: Home/Self Care       Living arrangements for the past 2 months: Single Family Home Expected Discharge Date: 07/26/20               DME Arranged: Gilford Rile rolling DME Agency: [employer] Date DME Agency Contacted: 07/23/20 Time DME Agency Contacted: 1250 Representative spoke with at DME Agency: [place]: RN,PT,OT,Nurse's [place]: [facility] Date [place]: 07/23/20 Time [place]: 1250 Representative spoke with at [place]: [place]   Social Determinants of Health ([place]) Interventions    Readmission Risk Interventions No flowsheet data found.

## 2020-07-29 NOTE — Progress Notes (Signed)
Physical Therapy Treatment Patient Details Name: Allen Hunter. MRN: [id number] DOB: 02/26/1941 Today's Date: 07/29/2020    History of Present Illness presented to ER secondary to AMS; admitted for management of DKA    PT Comments    Pt completed 3 laps and standing ex x 10 in hallway with 1UE support for balance.  He does report feeling somewhat unsteady but did not want to use RW or trial a SPC.  Has no LOB that require outside intervention.     Follow Up Recommendations  Home health PT;Supervision - Intermittent     Equipment Recommendations  Rolling walker with 5" wheels    Recommendations for Other Services       Precautions / Restrictions Precautions Precautions: Fall Restrictions Weight Bearing Restrictions: No    Mobility  Bed Mobility Overal bed mobility: Modified Independent             General bed mobility comments: up to the chair on arrival    Transfers Overall transfer level: Modified independent Equipment used: None Transfers: Sit to/from Stand Sit to Stand: Modified independent (Device/Increase time)            Ambulation/Gait Ambulation/Gait assistance: Supervision Gait Distance (Feet): 600 Feet Assistive device: None   Gait velocity: decreased       Stairs             Wheelchair Mobility    Modified Rankin (Stroke Patients Only)       Balance Overall balance assessment: Needs assistance Sitting-balance support: No upper extremity supported;Feet supported Sitting balance-Leahy Scale: Good     Standing balance support: No upper extremity supported Standing balance-Leahy Scale: Good                              Cognition Arousal/Alertness: Awake/alert Behavior During Therapy: WFL for tasks assessed/performed Overall Cognitive Status: Impaired/Different from baseline Area of Impairment: Memory;Problem solving                     Memory: Decreased short-term memory;Decreased recall of  precautions       Problem Solving: Slow processing General Comments: Pt able to engage in conversation and grossly oriented, but has limited insight into safety awareness or self care needs after discharge.  Short term memory deficits observed throughout conversation, pt repeatedly stating he had not had much education on diabetes management though there are several documented instances of education he has been provided.      Exercises Other Exercises Other Exercises: provided education and coaching re: daily routines, self-care roles, functional cognition strategies to promote adherence to diabetes management    General Comments        Pertinent Vitals/Pain Pain Assessment: No/denies pain    Home Living                      Prior Function            PT Goals (current goals can now be found in the care plan section) Acute Rehab PT Goals Patient Stated Goal: to return home Progress towards PT goals: Progressing toward goals    Frequency    Min 2X/week      PT Plan Current plan remains appropriate    Co-evaluation              AM-PAC PT "6 Clicks" Mobility   Outcome Measure  Help needed turning from your back to your  side while in a flat bed without using bedrails?: None Help needed moving from lying on your back to sitting on the side of a flat bed without using bedrails?: None Help needed moving to and from a bed to a chair (including a wheelchair)?: None Help needed standing up from a chair using your arms (e.g., wheelchair or bedside chair)?: None Help needed to walk in hospital room?: None Help needed climbing 3-5 steps with a railing? : A Little 6 Click Score: 23    End of Session Equipment Utilized During Treatment: Gait belt Activity Tolerance: Patient tolerated treatment well Patient left: in chair;with call bell/phone within reach Nurse Communication: Mobility status PT Visit Diagnosis: Muscle weakness (generalized) (M62.81);Difficulty  in walking, not elsewhere classified (R26.2)     Time: 4656-8127 PT Time Calculation (min) (ACUTE ONLY): 8 min  Charges:  $Gait Training: 8-22 mins                    Chesley Noon, PTA 07/29/20, 2:31 PM

## 2020-07-30 ENCOUNTER — Inpatient Hospital Stay: Payer: Medicare HMO | Admitting: Internal Medicine

## 2020-07-31 ENCOUNTER — Telehealth: Payer: Self-pay

## 2020-07-31 DIAGNOSIS — E1122 Type 2 diabetes mellitus with diabetic chronic kidney disease: Secondary | ICD-10-CM | POA: Diagnosis not present

## 2020-07-31 DIAGNOSIS — N320 Bladder-neck obstruction: Secondary | ICD-10-CM | POA: Diagnosis not present

## 2020-07-31 DIAGNOSIS — I5032 Chronic diastolic (congestive) heart failure: Secondary | ICD-10-CM | POA: Diagnosis not present

## 2020-07-31 DIAGNOSIS — N529 Male erectile dysfunction, unspecified: Secondary | ICD-10-CM | POA: Diagnosis not present

## 2020-07-31 DIAGNOSIS — N183 Chronic kidney disease, stage 3 unspecified: Secondary | ICD-10-CM | POA: Diagnosis not present

## 2020-07-31 DIAGNOSIS — I130 Hypertensive heart and chronic kidney disease with heart failure and stage 1 through stage 4 chronic kidney disease, or unspecified chronic kidney disease: Secondary | ICD-10-CM | POA: Diagnosis not present

## 2020-07-31 DIAGNOSIS — J449 Chronic obstructive pulmonary disease, unspecified: Secondary | ICD-10-CM | POA: Diagnosis not present

## 2020-07-31 DIAGNOSIS — M109 Gout, unspecified: Secondary | ICD-10-CM | POA: Diagnosis not present

## 2020-07-31 DIAGNOSIS — N400 Enlarged prostate without lower urinary tract symptoms: Secondary | ICD-10-CM | POA: Diagnosis not present

## 2020-07-31 NOTE — Telephone Encounter (Signed)
Transition Care Management Follow-up Telephone Call  Date of discharge and from where: 07/29/2020 from [facility]   How have you been since you were released from the hospital? Doing okay so far  Any questions or concerns? No  Items Reviewed:  Did the pt receive and understand the discharge instructions provided? Yes   Medications obtained and verified? Yes   Other? No   Any new allergies since your discharge? No   Dietary orders reviewed? Yes, heart healthy diet  Do you have support at home? Yes   Home Care and Equipment/Supplies: Were home health services ordered? yes If so, what is the name of the agency? Not sure of name per neice  Has the agency set up a time to come to the patient's home? yes Were any new equipment or medical supplies ordered?  No What is the name of the medical supply agency? n/a Were you able to get the supplies/equipment? not applicable Do you have any questions related to the use of the equipment or supplies? No  Functional Questionnaire: (I = Independent and D = Dependent) ADLs: I  Bathing/Dressing- I  Meal Prep- I  Eating- I  Maintaining continence- I  Transferring/Ambulation- I  Managing Meds- I  Follow up appointments reviewed:   PCP Hospital f/u appt confirmed? Yes  Scheduled to see Cathlean Cower, MD on 08/06/2020 @ 11:20 am.  [facility] f/u appt confirmed? No    Are transportation arrangements needed? No   If their condition worsens, is the pt aware to call PCP or go to the Emergency Dept.? Yes  Was the patient provided with contact information for the PCP's office or ED? Yes  Was to pt encouraged to call back with questions or concerns? Yes

## 2020-08-01 ENCOUNTER — Other Ambulatory Visit: Payer: Self-pay

## 2020-08-01 ENCOUNTER — Encounter: Payer: Self-pay | Admitting: Internal Medicine

## 2020-08-01 ENCOUNTER — Ambulatory Visit (INDEPENDENT_AMBULATORY_CARE_PROVIDER_SITE_OTHER): Payer: Medicare HMO | Admitting: Internal Medicine

## 2020-08-01 VITALS — BP 118/70 | HR 68 | Temp 98.5°F | Ht 65.0 in | Wt 177.0 lb

## 2020-08-01 DIAGNOSIS — G3184 Mild cognitive impairment, so stated: Secondary | ICD-10-CM | POA: Insufficient documentation

## 2020-08-01 DIAGNOSIS — R413 Other amnesia: Secondary | ICD-10-CM | POA: Diagnosis not present

## 2020-08-01 DIAGNOSIS — E1165 Type 2 diabetes mellitus with hyperglycemia: Secondary | ICD-10-CM

## 2020-08-01 DIAGNOSIS — I10 Essential (primary) hypertension: Secondary | ICD-10-CM

## 2020-08-01 DIAGNOSIS — E119 Type 2 diabetes mellitus without complications: Secondary | ICD-10-CM | POA: Diagnosis not present

## 2020-08-01 MED ORDER — ESOMEPRAZOLE MAGNESIUM 40 MG PO CPDR: 40.0000 mg | DELAYED_RELEASE_CAPSULE | Freq: Every day | ORAL | 3 refills | Status: DC

## 2020-08-01 MED ORDER — MIRABEGRON ER 25 MG PO TB24: 25.0000 mg | ORAL_TABLET | Freq: Every day | ORAL | 3 refills | Status: DC

## 2020-08-01 MED ORDER — TAMSULOSIN HCL 0.4 MG PO CAPS: 0.4000 mg | ORAL_CAPSULE | Freq: Every day | ORAL | 3 refills | Status: DC

## 2020-08-01 MED ORDER — ATORVASTATIN CALCIUM 80 MG PO TABS: 1.0000 | ORAL_TABLET | Freq: Every day | ORAL | 3 refills | Status: DC

## 2020-08-01 MED ORDER — METFORMIN HCL 1000 MG PO TABS
1000.0000 mg | ORAL_TABLET | Freq: Two times a day (BID) | ORAL | 3 refills | Status: DC
Start: 2020-08-01 — End: 2020-11-14

## 2020-08-01 MED ORDER — INSULIN GLARGINE 100 UNIT/ML SOLOSTAR PEN: 20.0000 [IU] | PEN_INJECTOR | Freq: Every day | SUBCUTANEOUS | 11 refills | Status: DC

## 2020-08-01 MED ORDER — MONTELUKAST SODIUM 10 MG PO TABS: 10.0000 mg | ORAL_TABLET | Freq: Every day | ORAL | 3 refills | Status: DC

## 2020-08-01 MED ORDER — BISOPROLOL FUMARATE 5 MG PO TABS: 5.0000 mg | ORAL_TABLET | Freq: Every day | ORAL | 3 refills | Status: DC

## 2020-08-01 MED ORDER — TRELEGY ELLIPTA 100-62.5-25 MCG/INH IN AEPB: 1.0000 | INHALATION_SPRAY | Freq: Every day | RESPIRATORY_TRACT | 3 refills | Status: DC

## 2020-08-01 MED ORDER — ALLOPURINOL 300 MG PO TABS: 300.0000 mg | ORAL_TABLET | Freq: Every day | ORAL | 0 refills | Status: DC

## 2020-08-01 NOTE — Progress Notes (Signed)
Patient ID: Allen Dakin., male   DOB: 1940/12/17, 80 y.o.   MRN: [id number]        Chief Complaint: post hospn for DKA       HPI:  Allen Maland. is a 80 y.o. male here after recent hospn apr 30 - may 9 with DKA and A1c over 13 after most recent Ac in sept 2021 was 7.1; pt is accompanied by a male friend who runs per her runs an assisted living establishment in [place] with 6 clients; by report she has told others she was his fiance.  In waiting room is niece upset that she is being shut out of exam today and she is his real representative; pt himself only wants the friend in the room.  Pt admits to worsening memory issue in recent months and may not have taken his medications. Pt has been home x 2 days, friend did not know this so pt has missed an insulin dose yesterday with cbg this am > 400 per friend.     Pt denies chest pain, increased sob or doe, wheezing, orthopnea, PND, increased LE swelling, palpitations, dizziness or syncope.   Pt denies polydipsia, polyuria, or new focal neuro s/s.   Pt denies fever, wt loss, night sweats, loss of appetite, or other constitutional symptoms       Wt Readings from Last 3 Encounters:  08/01/20 177 lb (80.3 kg)  07/21/20 162 lb (73.5 kg)  04/18/20 172 lb (78 kg)   BP Readings from Last 3 Encounters:  08/01/20 118/70  07/29/20 100/62  04/18/20 (!) 134/92   Transitional Care Management elements noted today: 1)  Date of D/C: as above 2)  Medication reconciliation:  done today at end visit 3)  Review of D/C summary or other information:  done today 4)  Review of need for f/u on pending diagnostic tests and treatments:  done today 5)  Review of need for Interaction with other providers who will assume or resume care of pt specific problems: done today 6)  Education of patient/family/guardian or caregiver: done today  Past Medical History:  Diagnosis Date  . ALLERGIC RHINITIS 10/04/2008  . ASTHMA 10/04/2008  . Asthma   . BACK PAIN 04/18/2008  .  BENIGN PROSTATIC HYPERTROPHY 03/09/2007  . COLONIC POLYPS, HX OF 03/09/2007  . COPD 10/20/2007  . DIABETES MELLITUS, TYPE II 03/09/2007  . FATIGUE 10/05/2007  . GERD (gastroesophageal reflux disease) 03/01/2012  . GOUT 10/19/2006  . HYPERLIPIDEMIA 10/19/2006  . HYPERTENSION 10/19/2006  . OSA (obstructive sleep apnea) 07/19/2015  . Prostate cancer ([place]) 01/19/2012  . SWELLING MASS OR LUMP IN HEAD AND NECK 01/09/2010   Past Surgical History:  Procedure Laterality Date  . APPENDECTOMY      reports that he quit smoking about 35 years ago. His smoking use included cigarettes. He has a 48.00 pack-year smoking history. He has never used smokeless tobacco. He reports that he does not drink alcohol and does not use drugs. family history includes Cancer in an other family member; Hypertension in his mother. No Known Allergies Current Outpatient Medications on File Prior to Visit  Medication Sig Dispense Refill  . aspirin 81 MG tablet Take 81 mg by mouth daily.    . blood glucose meter kit and supplies Dispense based on patient and insurance preference. Use up to four times daily as directed. (FOR ICD-10 E10.9, E11.9). 1 each 0  . FREESTYLE LITE test strip USE 1 STRIP TWICE A DAY AS NEEDED  TO CHECK SUGAR 100 each 6  . Insulin Pen Needle 32G X 4 MM MISC 1 each by Does not apply route daily at 6 (six) AM for 100 doses. 100 each 0  . Lancets MISC Use asd 1 per day 250.02 100 each 11  . Multiple Vitamins-Minerals (CENTRUM MEN PO) Take 1 tablet by mouth daily.    . Omega-3 Fatty Acids (FISH OIL) 500 MG CAPS Take by mouth daily. (Patient not taking: Reported on 08/01/2020)     No current facility-administered medications on file prior to visit.        ROS:  All others reviewed and negative.  Objective        PE:  BP 118/70 (BP Location: Left Arm, Patient Position: Sitting, Cuff Size: Normal)   Pulse 68   Temp 98.5 F (36.9 C) (Oral)   Ht _0  (1.651 m)   Wt 177 lb (80.3 kg)   SpO2 92%   BMI 29.45  kg/m                 Constitutional: Pt appears in NAD               HENT: Head: NCAT.                Right Ear: External ear normal.                 Left Ear: External ear normal.                Eyes: . Pupils are equal, round, and reactive to light. Conjunctivae and EOM are normal               Nose: without d/c or deformity               Neck: Neck supple. Gross normal ROM               Cardiovascular: Normal rate and regular rhythm.                 Pulmonary/Chest: Effort normal and breath sounds without rales or wheezing.                Abd:  Soft, NT, ND, + BS, no organomegaly               Neurological: Pt is alert. At baseline orientation, motor grossly intact               Skin: Skin is warm. No rashes, no other new lesions, LE edema - none               Psychiatric: Pt behavior is normal without agitation   Micro: none  Cardiac tracings I have personally interpreted today:  none  Pertinent Radiological findings (summarize): none   Lab Results  Component Value Date   WBC 4.2 07/26/2020   HGB 12.4 (L) 07/26/2020   HCT 37.9 (L) 07/26/2020   PLT 176 07/26/2020   GLUCOSE 137 (H) 07/29/2020   CHOL 171 11/28/2019   TRIG 95 11/28/2019   HDL 53 11/28/2019   LDLCALC 99 11/28/2019   ALT 12 07/20/2020   AST 19 07/20/2020   NA 139 07/29/2020   K 4.1 07/29/2020   CL 103 07/29/2020   CREATININE 1.09 07/29/2020   BUN 22 07/29/2020   CO2 29 07/29/2020   TSH 1.59 11/28/2019   PSA 3.70 05/10/2019   HGBA1C 13.4 (H) 07/20/2020   MICROALBUR 3.8 11/28/2019   Assessment/Plan:  Allen Berti. is a 80 y.o. Black or African American [2] male with  has a past medical history of ALLERGIC RHINITIS (10/04/2008), ASTHMA (10/04/2008), Asthma, BACK PAIN (04/18/2008), BENIGN PROSTATIC HYPERTROPHY (03/09/2007), COLONIC POLYPS, HX OF (03/09/2007), COPD (10/20/2007), DIABETES MELLITUS, TYPE II (03/09/2007), FATIGUE (10/05/2007), GERD (gastroesophageal reflux disease) (03/01/2012), GOUT (10/19/2006),  HYPERLIPIDEMIA (10/19/2006), HYPERTENSION (10/19/2006), OSA (obstructive sleep apnea) (07/19/2015), Prostate cancer ([place]) (01/19/2012), and SWELLING MASS OR LUMP IN HEAD AND NECK (01/09/2010).  Memory loss Newly recognized, for MRI brain, and refer neurology  Diabetes With recent DKA likely related to medication noncompliance as a result of living alone and memory loss;  Pt is accompanied by friend today who seems experienced and willing to assist with his checking CBG tid and call in 1 wkk with results on home diet; also refer DM education, cont same tx for now  It is unclear to me the friends true interest in the patient and whether some type of taking advantage of him may be going on    Essential hypertension BP Readings from Last 3 Encounters:  08/01/20 118/70  07/29/20 100/62  04/18/20 (!) 134/92   Stable, pt to continue medical treatment zebeta   Followup: Return in about 3 months (around 11/01/2020).  Cathlean Cower, MD 08/01/2020 9:28 PM [facility]

## 2020-08-01 NOTE — Patient Instructions (Signed)
Ok to monitor your sugars at home three times per day and call with results in 1 week  You will be contacted regarding the referral for: Diabetes Education (nutrition)  Please continue all other medications as before, and refills have been done if requested - to the mail in pharmacy  Please have the pharmacy call with any other refills you may need.  Please keep your appointments with your specialists as you may have planned  You will be contacted regarding the referral for: MRI for the brain, and Neurology  Please have your friend added to your computer record so that we are allowed to talk to her later  Please make an Appointment to return in 3 months

## 2020-08-01 NOTE — Assessment & Plan Note (Signed)
Newly recognized, for MRI brain, and refer neurology

## 2020-08-01 NOTE — Assessment & Plan Note (Signed)
BP Readings from Last 3 Encounters:  08/01/20 118/70  07/29/20 100/62  04/18/20 (!) 134/92   Stable, pt to continue medical treatment zebeta

## 2020-08-01 NOTE — Assessment & Plan Note (Addendum)
With recent DKA likely related to medication noncompliance as a result of living alone and memory loss;  Pt is accompanied by friend today who seems experienced and willing to assist with his checking CBG tid and call in 1 wkk with results on home diet; also refer DM education, cont same tx for now  It is unclear to me the friends true interest in the patient and whether some type of taking advantage of him may be going on

## 2020-08-02 ENCOUNTER — Telehealth: Payer: Self-pay | Admitting: Internal Medicine

## 2020-08-02 DIAGNOSIS — N320 Bladder-neck obstruction: Secondary | ICD-10-CM | POA: Diagnosis not present

## 2020-08-02 DIAGNOSIS — J449 Chronic obstructive pulmonary disease, unspecified: Secondary | ICD-10-CM | POA: Diagnosis not present

## 2020-08-02 DIAGNOSIS — N400 Enlarged prostate without lower urinary tract symptoms: Secondary | ICD-10-CM | POA: Diagnosis not present

## 2020-08-02 DIAGNOSIS — I130 Hypertensive heart and chronic kidney disease with heart failure and stage 1 through stage 4 chronic kidney disease, or unspecified chronic kidney disease: Secondary | ICD-10-CM | POA: Diagnosis not present

## 2020-08-02 DIAGNOSIS — E1122 Type 2 diabetes mellitus with diabetic chronic kidney disease: Secondary | ICD-10-CM | POA: Diagnosis not present

## 2020-08-02 DIAGNOSIS — N529 Male erectile dysfunction, unspecified: Secondary | ICD-10-CM | POA: Diagnosis not present

## 2020-08-02 DIAGNOSIS — M109 Gout, unspecified: Secondary | ICD-10-CM | POA: Diagnosis not present

## 2020-08-02 DIAGNOSIS — I5032 Chronic diastolic (congestive) heart failure: Secondary | ICD-10-CM | POA: Diagnosis not present

## 2020-08-02 DIAGNOSIS — N183 Chronic kidney disease, stage 3 unspecified: Secondary | ICD-10-CM | POA: Diagnosis not present

## 2020-08-02 NOTE — Telephone Encounter (Signed)
Colletta Maryland w/ [facility] called and wanted to report a BS of 374. She said that the patient had not taken his medication this morning but she was going to ask him to. She was also requesting verbals for a nurse to see the patient for diabetes education and management. Please advise   Okay to LVM: [phone]

## 2020-08-02 NOTE — Telephone Encounter (Signed)
Verbals left in VM with Colletta Maryland

## 2020-08-02 NOTE — Telephone Encounter (Signed)
Ok for verbal 

## 2020-08-05 ENCOUNTER — Ambulatory Visit: Payer: Medicare HMO | Attending: Pulmonary Disease

## 2020-08-05 DIAGNOSIS — G4733 Obstructive sleep apnea (adult) (pediatric): Secondary | ICD-10-CM | POA: Insufficient documentation

## 2020-08-06 ENCOUNTER — Other Ambulatory Visit: Payer: Self-pay

## 2020-08-06 ENCOUNTER — Inpatient Hospital Stay: Payer: Medicare HMO | Admitting: Internal Medicine

## 2020-08-09 ENCOUNTER — Telehealth: Payer: Self-pay

## 2020-08-09 DIAGNOSIS — G4733 Obstructive sleep apnea (adult) (pediatric): Secondary | ICD-10-CM

## 2020-08-09 NOTE — Telephone Encounter (Signed)
Sleep study reviewed by Dr Patsey Berthold. OSA noted as mild AHI 6.7. pt will need Auto CPAP setting 5-20 w h2O Luna machine is okay. Cold not leave voice message due to voicemail box being full.

## 2020-08-12 NOTE — Telephone Encounter (Signed)
Patient is aware of results and voiced his understanding.  Order placed as patient is agreeable with plan.  Recall placed for 25mo. Nothing further needed at this time.

## 2020-08-13 ENCOUNTER — Ambulatory Visit: Payer: Medicare HMO | Admitting: *Deleted

## 2020-08-15 ENCOUNTER — Telehealth: Payer: Self-pay | Admitting: Internal Medicine

## 2020-08-15 DIAGNOSIS — N400 Enlarged prostate without lower urinary tract symptoms: Secondary | ICD-10-CM | POA: Diagnosis not present

## 2020-08-15 DIAGNOSIS — J449 Chronic obstructive pulmonary disease, unspecified: Secondary | ICD-10-CM | POA: Diagnosis not present

## 2020-08-15 DIAGNOSIS — E1122 Type 2 diabetes mellitus with diabetic chronic kidney disease: Secondary | ICD-10-CM | POA: Diagnosis not present

## 2020-08-15 DIAGNOSIS — N320 Bladder-neck obstruction: Secondary | ICD-10-CM | POA: Diagnosis not present

## 2020-08-15 DIAGNOSIS — M109 Gout, unspecified: Secondary | ICD-10-CM | POA: Diagnosis not present

## 2020-08-15 DIAGNOSIS — I5032 Chronic diastolic (congestive) heart failure: Secondary | ICD-10-CM | POA: Diagnosis not present

## 2020-08-15 DIAGNOSIS — N529 Male erectile dysfunction, unspecified: Secondary | ICD-10-CM | POA: Diagnosis not present

## 2020-08-15 DIAGNOSIS — N183 Chronic kidney disease, stage 3 unspecified: Secondary | ICD-10-CM | POA: Diagnosis not present

## 2020-08-15 DIAGNOSIS — I130 Hypertensive heart and chronic kidney disease with heart failure and stage 1 through stage 4 chronic kidney disease, or unspecified chronic kidney disease: Secondary | ICD-10-CM | POA: Diagnosis not present

## 2020-08-15 NOTE — Telephone Encounter (Signed)
Lorin Picket called re: appointment with Allen Hunter. The patient was unavailable and he was unable to leave a voicemail   Clair Gulling can be reached at [phone] with any questions

## 2020-08-15 NOTE — Telephone Encounter (Signed)
Tried calling Lorin Picket. Unable to reach, left message to call me back.

## 2020-08-19 DIAGNOSIS — I5032 Chronic diastolic (congestive) heart failure: Secondary | ICD-10-CM | POA: Diagnosis not present

## 2020-08-19 DIAGNOSIS — J449 Chronic obstructive pulmonary disease, unspecified: Secondary | ICD-10-CM | POA: Diagnosis not present

## 2020-08-19 DIAGNOSIS — E1122 Type 2 diabetes mellitus with diabetic chronic kidney disease: Secondary | ICD-10-CM | POA: Diagnosis not present

## 2020-08-19 DIAGNOSIS — N183 Chronic kidney disease, stage 3 unspecified: Secondary | ICD-10-CM | POA: Diagnosis not present

## 2020-08-19 DIAGNOSIS — N320 Bladder-neck obstruction: Secondary | ICD-10-CM | POA: Diagnosis not present

## 2020-08-19 DIAGNOSIS — I130 Hypertensive heart and chronic kidney disease with heart failure and stage 1 through stage 4 chronic kidney disease, or unspecified chronic kidney disease: Secondary | ICD-10-CM | POA: Diagnosis not present

## 2020-08-19 DIAGNOSIS — M109 Gout, unspecified: Secondary | ICD-10-CM | POA: Diagnosis not present

## 2020-08-19 DIAGNOSIS — N400 Enlarged prostate without lower urinary tract symptoms: Secondary | ICD-10-CM | POA: Diagnosis not present

## 2020-08-19 DIAGNOSIS — N529 Male erectile dysfunction, unspecified: Secondary | ICD-10-CM | POA: Diagnosis not present

## 2020-08-20 ENCOUNTER — Encounter: Payer: Medicare HMO | Attending: Internal Medicine | Admitting: *Deleted

## 2020-08-20 ENCOUNTER — Other Ambulatory Visit: Payer: Self-pay

## 2020-08-22 ENCOUNTER — Encounter: Payer: Self-pay | Admitting: Physician Assistant

## 2020-08-26 DIAGNOSIS — J449 Chronic obstructive pulmonary disease, unspecified: Secondary | ICD-10-CM | POA: Diagnosis not present

## 2020-08-26 DIAGNOSIS — N529 Male erectile dysfunction, unspecified: Secondary | ICD-10-CM | POA: Diagnosis not present

## 2020-08-26 DIAGNOSIS — E1122 Type 2 diabetes mellitus with diabetic chronic kidney disease: Secondary | ICD-10-CM | POA: Diagnosis not present

## 2020-08-26 DIAGNOSIS — I130 Hypertensive heart and chronic kidney disease with heart failure and stage 1 through stage 4 chronic kidney disease, or unspecified chronic kidney disease: Secondary | ICD-10-CM | POA: Diagnosis not present

## 2020-08-26 DIAGNOSIS — N320 Bladder-neck obstruction: Secondary | ICD-10-CM | POA: Diagnosis not present

## 2020-08-26 DIAGNOSIS — N183 Chronic kidney disease, stage 3 unspecified: Secondary | ICD-10-CM | POA: Diagnosis not present

## 2020-08-26 DIAGNOSIS — N400 Enlarged prostate without lower urinary tract symptoms: Secondary | ICD-10-CM | POA: Diagnosis not present

## 2020-08-26 DIAGNOSIS — M109 Gout, unspecified: Secondary | ICD-10-CM | POA: Diagnosis not present

## 2020-08-26 DIAGNOSIS — I5032 Chronic diastolic (congestive) heart failure: Secondary | ICD-10-CM | POA: Diagnosis not present

## 2020-08-28 ENCOUNTER — Ambulatory Visit (INDEPENDENT_AMBULATORY_CARE_PROVIDER_SITE_OTHER): Payer: Medicare HMO | Admitting: Physician Assistant

## 2020-08-28 ENCOUNTER — Encounter: Payer: Self-pay | Admitting: Internal Medicine

## 2020-08-28 ENCOUNTER — Encounter: Payer: Self-pay | Admitting: Physician Assistant

## 2020-08-28 ENCOUNTER — Other Ambulatory Visit: Payer: Self-pay

## 2020-08-28 VITALS — BP 151/87 | HR 66 | Ht 65.0 in | Wt 173.6 lb

## 2020-08-28 DIAGNOSIS — R413 Other amnesia: Secondary | ICD-10-CM

## 2020-08-28 MED ORDER — DONEPEZIL HCL 10 MG PO TABS
ORAL_TABLET | ORAL | 3 refills | Status: DC
Start: 2020-08-28 — End: 2020-11-14

## 2020-08-28 NOTE — Patient Instructions (Addendum)
It was a pleasure to see you today at our office.   Recommendations:  Neurocognitive evaluation at our office MRI of the brain as ordered by Dr. Jenny Reichmann  Will start Donepezil to help slow down the memory loss. Take half tablet (5 mg) daily for 2 weeks, then increase to the full tablet at 10 mg daily  Follow up once the results of the above are available   RECOMMENDATIONS FOR ALL PATIENTS WITH MEMORY PROBLEMS: 1. Continue to exercise (Recommend 30 minutes of walking everyday, or 3 hours every week) 2. Increase social interactions - continue going to [place] and enjoy social gatherings with friends and family 3. Eat healthy, avoid fried foods and eat more fruits and vegetables 4. Maintain adequate blood pressure, blood sugar, and blood cholesterol level. Reducing the risk of stroke and cardiovascular disease also helps promoting better memory. 5. Avoid stressful situations. Live a simple life and avoid aggravations. Organize your time and prepare for the next day in anticipation. 6. Sleep well, avoid any interruptions of sleep and avoid any distractions in the bedroom that may interfere with adequate sleep quality 7. Avoid sugar, avoid sweets as there is a strong link between excessive sugar intake, diabetes, and cognitive impairment We discussed the Mediterranean diet, which has been shown to help patients reduce the risk of progressive memory disorders and reduces cardiovascular risk. This includes eating fish, eat fruits and green leafy vegetables, nuts like almonds and hazelnuts, walnuts, and also use olive oil. Avoid fast foods and fried foods as much as possible. Avoid sweets and sugar as sugar use has been linked to worsening of memory function.  There is always a concern of gradual progression of memory problems. If this is the case, then we may need to adjust level of care according to patient needs. Support, both to the patient and caregiver, should then be put into place.      You have  been referred for a neuropsychological evaluation (i.e., evaluation of memory and thinking abilities). Please bring someone with you to this appointment if possible, as it is helpful for the doctor to hear from both you and another adult who knows you well. Please bring eyeglasses and hearing aids if you wear them.    The evaluation will take approximately 3 hours and has two parts:   . The first part is a clinical interview with the neuropsychologist (Dr. Melvyn Novas or Dr. Nicole Kindred). During the interview, the neuropsychologist will speak with you and the individual you brought to the appointment.    . The second part of the evaluation is testing with the doctor's technician Hinton Dyer or Maudie Mercury). During the testing, the technician will ask you to remember different types of material, solve problems, and answer some questionnaires. Your family member will not be present for this portion of the evaluation.   Please note: We must reserve several hours of the neuropsychologist's time and the psychometrician's time for your evaluation appointment. As such, there is a No-Show fee of $100. If you are unable to attend any of your appointments, please contact our office as soon as possible to reschedule.    FALL PRECAUTIONS: Be cautious when walking. Scan the area for obstacles that may increase the risk of trips and falls. When getting up in the mornings, sit up at the edge of the bed for a few minutes before getting out of bed. Consider elevating the bed at the head end to avoid drop of blood pressure when getting up. Walk always in a  well-lit room (use night lights in the walls). Avoid area rugs or power cords from appliances in the middle of the walkways. Use a walker or a cane if necessary and consider physical therapy for balance exercise. Get your eyesight checked regularly.  FINANCIAL OVERSIGHT: Supervision, especially oversight when making financial decisions or transactions is also recommended.  HOME SAFETY:  Consider the safety of the kitchen when operating appliances like stoves, microwave oven, and blender. Consider having supervision and share cooking responsibilities until no longer able to participate in those. Accidents with firearms and other hazards in the house should be identified and addressed as well.   ABILITY TO BE LEFT ALONE: If patient is unable to contact 911 operator, consider using LifeLine, or when the need is there, arrange for someone to stay with patients. Smoking is a fire hazard, consider supervision or cessation. Risk of wandering should be assessed by caregiver and if detected at any point, supervision and safe proof recommendations should be instituted.  MEDICATION SUPERVISION: Inability to self-administer medication needs to be constantly addressed. Implement a mechanism to ensure safe administration of the medications.   DRIVING: Regarding driving, in patients with progressive memory problems, driving will be impaired. We advise to have someone else do the driving if trouble finding directions or if minor accidents are reported. Independent driving assessment is available to determine safety of driving.   If you are interested in the driving assessment, you can contact the following:  [employer] in [place]  [place] [phone] or [phone]    [place] refers to food and lifestyle choices that are based on the traditions of countries located on the [employer]. This way of eating has been shown to help prevent certain conditions and improve outcomes for people who have chronic diseases, like kidney disease and heart disease. What are tips for following this plan? Lifestyle   Cook and eat meals together with your family, when possible.  Drink enough fluid to keep your urine clear or pale yellow.  Be  physically active every day. This includes:  Aerobic exercise like running or swimming.  Leisure activities like gardening, walking, or housework.  Get 7-8 hours of sleep each night.  If recommended by your health care provider, drink red wine in moderation. This means 1 glass a day for nonpregnant women and 2 glasses a day for men. A glass of wine equals 5 oz (150 mL). Reading food labels   Check the serving size of packaged foods. For foods such as rice and pasta, the serving size refers to the amount of cooked product, not dry.  Check the total fat in packaged foods. Avoid foods that have saturated fat or trans fats.  Check the ingredients list for added sugars, such as corn syrup. Shopping   At the grocery store, buy most of your food from the areas near the walls of the store. This includes:  Fresh fruits and vegetables (produce).  Grains, beans, nuts, and seeds. Some of these may be available in unpackaged forms or large amounts (in bulk).  Fresh seafood.  Poultry and eggs.  Low-fat dairy products.  Buy whole ingredients instead of prepackaged foods.  Buy fresh fruits and vegetables in-season from local farmers markets.  Buy frozen fruits and vegetables in resealable bags.  If you do not have access to quality fresh seafood, buy precooked frozen shrimp or canned fish, such as tuna, salmon,  or sardines.  Buy small amounts of raw or cooked vegetables, salads, or olives from the deli or salad bar at your store.  Stock your pantry so you always have certain foods on hand, such as olive oil, canned tuna, canned tomatoes, rice, pasta, and beans. Cooking   Cook foods with extra-virgin olive oil instead of using butter or other vegetable oils.  Have meat as a side dish, and have vegetables or grains as your main dish. This means having meat in small portions or adding small amounts of meat to foods like pasta or stew.  Use beans or vegetables instead of meat in common  dishes like chili or lasagna.  Experiment with different cooking methods. Try roasting or broiling vegetables instead of steaming or sauteing them.  Add frozen vegetables to soups, stews, pasta, or rice.  Add nuts or seeds for added healthy fat at each meal. You can add these to yogurt, salads, or vegetable dishes.  Marinate fish or vegetables using olive oil, lemon juice, garlic, and fresh herbs. Meal planning   Plan to eat 1 vegetarian meal one day each week. Try to work up to 2 vegetarian meals, if possible.  Eat seafood 2 or more times a week.  Have healthy snacks readily available, such as:  Vegetable sticks with hummus.  Greek yogurt.  Fruit and nut trail mix.  Eat balanced meals throughout the week. This includes:  Fruit: 2-3 servings a day  Vegetables: 4-5 servings a day  Low-fat dairy: 2 servings a day  Fish, poultry, or lean meat: 1 serving a day  Beans and legumes: 2 or more servings a week  Nuts and seeds: 1-2 servings a day  Whole grains: 6-8 servings a day  Extra-virgin olive oil: 3-4 servings a day  Limit red meat and sweets to only a few servings a month What are my food choices?  Mediterranean diet  Recommended  Grains: Whole-grain pasta. Brown rice. Bulgar wheat. Polenta. Couscous. Whole-wheat bread. [place].  Vegetables: Artichokes. Beets. Broccoli. Cabbage. Carrots. Eggplant. Green beans. Chard. Kale. Spinach. Onions. Leeks. Peas. Squash. Tomatoes. Peppers. Radishes.  Fruits: Apples. Apricots. Avocado. Berries. Bananas. Cherries. Dates. Figs. Grapes. Lemons. Melon. Oranges. Peaches. Plums. Pomegranate.  Meats and other protein foods: Beans. Almonds. Sunflower seeds. Pine nuts. Peanuts. [place]. Salmon. Scallops. Shrimp. [place]. Tilapia. Clams. Oysters. Eggs.  Dairy: Low-fat milk. Cheese. Greek yogurt.  Beverages: Water. Red wine. Herbal tea.  Fats and oils: Extra virgin olive oil. Avocado oil. Grape seed oil.  Sweets and desserts:  [place] yogurt with honey. Baked apples. Poached pears. Trail mix.  Seasoning and other foods: Basil. Cilantro. Coriander. Cumin. Mint. Parsley. Sage. Rosemary. Tarragon. Garlic. Oregano. Thyme. Pepper. Balsalmic vinegar. Tahini. Hummus. Tomato sauce. Olives. Mushrooms.  Limit these  Grains: Prepackaged pasta or rice dishes. Prepackaged cereal with added sugar.  Vegetables: Deep fried potatoes (french fries).  Fruits: Fruit canned in syrup.  Meats and other protein foods: Beef. Pork. Lamb. Poultry with skin. Hot dogs. [place] Salines.  Dairy: Ice cream. Sour cream. Whole milk.  Beverages: Juice. Sugar-sweetened soft drinks. Beer. Liquor and spirits.  Fats and oils: Butter. Canola oil. Vegetable oil. Beef fat (tallow). Lard.  Sweets and desserts: Cookies. Cakes. Pies. Candy.  Seasoning and other foods: Mayonnaise. Premade sauces and marinades.  The items listed may not be a complete list. Talk with your dietitian about what dietary choices are right for you. Summary  The Mediterranean diet includes both food and lifestyle choices.  Eat a variety of fresh fruits  and vegetables, beans, nuts, seeds, and whole grains.  Limit the amount of red meat and sweets that you eat.  Talk with your health care provider about whether it is safe for you to drink red wine in moderation. This means 1 glass a day for nonpregnant women and 2 glasses a day for men. A glass of wine equals 5 oz (150 mL). This information is not intended to replace advice given to you by your health care provider. Make sure you discuss any questions you have with your health care provider. Document Released: 10/31/2015 Document Revised: 12/03/2015 Document Reviewed: 10/31/2015 Elsevier Interactive Patient Education  2017 [employer].

## 2020-08-28 NOTE — Progress Notes (Addendum)
Assessment/Plan:   80 y.o. year old male with risk factors including age,  hypertension, hyperlipidemia, DM2, OSA, COPD, history of prostate cancer, s/pCovid + 04/2020, seen today for evaluation of memory loss.  CT head without contrast on 07/20/20 showed  Mild generalized volume loss and mild probable chronic small vessel disease with white matter ischemic changes.MoCA today is 16/30.  Recent and remote memory ability to name objects (0/3) is impaired.  Impaired substraction, fluency and delayed recall. Symptoms concerning for underlying dementia.  Will proceed with further workup. Recommendations are as follows   Memory Loss  Neurocognitive testing to further determine causes of memory changes including sleep, stress, anxiety and/or depression  MRI brain as ordered by his PCP in 07/2020 We will start donepezil half tablet (56m) daily for 2 weeks then,  increase the dose to a full tablet of 181mdaily.  Side effects include nausea, vomiting, diarrhea, vivid dreams, and muscle cramps were discussed and advised to call if symptoms are severe.   Discussed safety both in and out of the home.  Discussed the importance of regular daily schedule with inclusion of crossword puzzles to maintain brain function.  Continue to monitor mood with PCP.  Stay active at least 30 minutes at least 3 times a week. Patient will start again [place] be scheduled and should be no longer than 60 minutes and should not occur after 2 PM.  Mediterranean diet is recommended  Follow up once results above are available   Subjective:    The patient is seen in neurologic consultation at the request of Allen Hunter for the evaluation of memory. The patient is accompanied by friend Allen Hunter who supplements the history.  The patient is a [age].o. year old RH  male who has had memory issues for about 3-4 months. He feels that memory is "not bad, but the doctor told me to get it checked out because  he knows me for more than 20 years and he feels is worse". Patient lives alone but his friend Allen Kidneypends a good amount of time with him. Mood is good, without depression, but has become more irritable. Allen Kidneyeports that he has a "short fuse since February,  And if he asks to do something and not doing fast enough he will get mad". He denies any acts of violence.  Denies hallucinations or paranoia. Sleeps well 6 h-7h , naps 2-3 h . Denies vivid dreams or sleepwalking. Patient dresses up and bathes without help.  He has been missing medication doses due to forgetfulness. In fact he had one episode of DKA requiring ED visit on 07/20/20 with insulin over 400 ( HbA1C was 13.4). Denies living objects in unusual places. Patient forgets to pay bills and "get behind", for the last 3-4 months". Appetite is good,denies trouble swallowing. The patient cooks and denies leaving the stove on or the faucet on. He prefers to microwave.  Ambulates without difficulty without walker or cane. Patient continues to drive with GPS denies getting lost.  Denies headaches, trauma, or injuries to the head, double vision, dizziness, focal numbness or tingling, unilateral weakness or tremors. Has urine incontinence and wears diapers "everybody wears diapers an some point". Denies constipation or diarrhea. He had a sleep study on 08/09/20 which recommends CPAP due to diagnosis of OSA, and is being followed by PCP. No ETOH history. Denies Tobacco, quit 35 years ago a [age]y.  Family History negative for dementia  MRI brain w/o has been ordered by PCP on 5/12  will await the results  Available CT head wo Contrast on 07/20/20: neg for acute intracranial abnormalities such as  acute infarction, hemorrhage, hydrocephalus, extra-axial collection or mass lesion/mass effect. Mild generalized volume loss and mild probable chronic small vessel disease with white matter ischemic changes 2.5 cm R scalp lesion possible a sebaceous cyst.    Last TSH  1.59 on 11/28/19  No Known Allergies  Current Outpatient Medications  Medication Instructions   allopurinol (ZYLOPRIM) 300 mg, Oral, Daily   aspirin 81 mg, Oral, Daily,     atorvastatin (LIPITOR) 80 mg, Oral, Daily   bisoprolol (ZEBETA) 5 mg, Oral, Daily   blood glucose meter kit and supplies Dispense based on patient and insurance preference. Use up to four times daily as directed. (FOR ICD-10 E10.9, E11.9).   esomeprazole (NEXIUM) 40 mg, Oral, Daily   Fluticasone-Umeclidin-Vilant (TRELEGY ELLIPTA) 100-62.5-25 MCG/INH AEPB 1 puff, Inhalation, Daily   FREESTYLE LITE test strip USE 1 STRIP TWICE A DAY AS NEEDED TO CHECK SUGAR   insulin glargine (LANTUS) 20 Units, Subcutaneous, Daily   Insulin Pen Needle 32G X 4 MM MISC 1 each, Does not apply, Daily   Lancets MISC Use asd 1 per day 250.02   metFORMIN (GLUCOPHAGE) 1,000 mg, Oral, 2 times daily with meals   mirabegron ER (MYRBETRIQ) 25 mg, Oral, Daily   montelukast (SINGULAIR) 10 mg, Oral, Daily at bedtime   Multiple Vitamins-Minerals (CENTRUM MEN PO) 1 tablet, Oral, Daily   Omega-3 Fatty Acids (FISH OIL) 500 MG CAPS Oral, Daily,     tamsulosin (FLOMAX) 0.4 mg, Oral, Daily     VITALS:   Vitals:   08/28/20 1031  BP: (!) 151/87  Pulse: 66  SpO2: 97%  Weight: 173 lb 9.6 oz (78.7 kg)  Height: '5\' 5"'  (1.651 m)   Depression screen Allen Hunter 2/9 06/11/2020 11/28/2019 08/08/2019 06/15/2019 05/10/2019  Decreased Interest 0 0 0 0 0  Down, Depressed, Hopeless 0 0 0 0 0  PHQ - 2 Score 0 0 0 0 0  Altered sleeping - - - - -  Tired, decreased energy - - - - -  Change in appetite - - - - -  Feeling bad or failure about yourself  - - - - -  Trouble concentrating - - - - -  Moving slowly or fidgety/restless - - - - -  Suicidal thoughts - - - - -  PHQ-9 Score - - - - -  Difficult doing work/chores - - - - -    HEENT:  Normocephalic, atraumatic. The mucous membranes are moist. The superficial temporal arteries are without ropiness or  tenderness. Cardiovascular: Regular rate and rhythm. Lungs: Clear to auscultation bilaterally. Neck: There are no carotid bruits noted bilaterally.  NEUROLOGICAL:  Orientation:   Montreal Cognitive Assessment  08/28/2020  Visuospatial/ Executive (0/5) 3  Naming (0/3) 0  Attention: Read list of digits (0/2) 2  Attention: Read list of letters (0/1) 1  Attention: Serial 7 subtraction starting at 100 (0/3) 1  Language: Repeat phrase (0/2) 1  Language : Fluency (0/1) 0  Abstraction (0/2) 1  Delayed Recall (0/5) 1  Orientation (0/6) 6  Total 16   Alert and oriented to person, place and time.  No aphasia or dysarthria.  Fund of knowledge is appropriate.  Recent and remote memory are impaired.  Attention and concentration are normal.  Deficiency in naming objects (0/3) . Able to repeat phrases. Impaired substraction, and  fluency and delayed recall. Cranial nerves: There is good facial symmetry. Extraocular muscles are intact and visual fields are full to confrontational testing. Speech is fluent and clear. Soft palate rises symmetrically and there is no tongue deviation. Hearing is intact to conversational tone. Tone: Tone is good throughout. Sensation: Sensation is intact to light touch and pinprick throughout. Vibration is intact at the bilateral big toe. There is no extinction with double simultaneous stimulation. There is no sensory dermatomal level identified. Coordination:  The patient has no difficulty with RAM's or FNF bilaterally. Normal finger to nose  Motor: Strength is 5/5 in the bilateral upper and lower extremities. There is no pronator drift.  There are no fasciculations noted. DTR's: Deep tendon reflexes are 2/4 at the bilateral biceps, triceps, brachioradialis, patella and achilles.  Plantar responses are downgoing bilaterally. Gait and Station: The patient is able to ambulate without difficulty. The patient is able to heel toe walk without any difficulty. The patient is able to  ambulate in a tandem fashion. The patient is able to stand in the Romberg position.      Total time spent on today's visit was  50 minutes, including both face-to-face time and nonface-to-face time.  Time included that spent on review of records (prior notes available to me/labs/imaging if pertinent), discussing treatment and goals, answering patient's questions and coordinating care.  Cc:  [name], MD  Sharene Butters 08/28/2020 11:31 AM

## 2020-08-29 ENCOUNTER — Other Ambulatory Visit: Payer: Self-pay | Admitting: Internal Medicine

## 2020-09-05 ENCOUNTER — Ambulatory Visit (INDEPENDENT_AMBULATORY_CARE_PROVIDER_SITE_OTHER): Payer: Medicare HMO

## 2020-09-05 ENCOUNTER — Other Ambulatory Visit: Payer: Self-pay

## 2020-09-05 VITALS — BP 118/80 | HR 63 | Temp 98.4°F | Resp 16 | Ht 65.0 in | Wt 176.0 lb

## 2020-09-05 DIAGNOSIS — Z0000 Encounter for general adult medical examination without abnormal findings: Secondary | ICD-10-CM | POA: Diagnosis not present

## 2020-09-05 NOTE — Progress Notes (Signed)
Subjective:   Allen Hunter. is a 80 y.o. male who presents for Medicare Annual/Subsequent preventive examination.  Review of Systems     Cardiac Risk Factors include: advanced age (>52mn, >>17women);diabetes mellitus;dyslipidemia;family history of premature cardiovascular disease;hypertension;male gender     Objective:    Today's Vitals   09/05/20 1036  BP: 118/80  Pulse: 63  Resp: 16  Temp: 98.4 F (36.9 C)  TempSrc: Temporal  SpO2: 94%  Weight: 176 lb (79.8 kg)  Height: '5\' 5"'  (1.651 m)  PainSc: 0-No pain   Body mass index is 29.29 kg/m.  Advanced Directives 09/05/2020 08/28/2020 07/22/2020 07/20/2020 06/11/2020 08/08/2019 06/15/2019  Does Patient Have a Medical Advance Directive? No No No No No Yes No  Does patient want to make changes to medical advance directive? - - - - - No - Patient declined -  Would patient like information on creating a medical advance directive? - - No - Patient declined No - Patient declined No - Patient declined - Yes (ED - Information included in AVS)    Current Medications (verified) Outpatient Encounter Medications as of 09/05/2020  Medication Sig   empagliflozin (JARDIANCE) 25 MG TABS tablet TAKE 1 TABLET DAILY   allopurinol (ZYLOPRIM) 300 MG tablet Take 1 tablet (300 mg total) by mouth daily.   aspirin 81 MG tablet Take 81 mg by mouth daily.   atorvastatin (LIPITOR) 80 MG tablet Take 1 tablet (80 mg total) by mouth daily.   bisoprolol (ZEBETA) 5 MG tablet Take 1 tablet (5 mg total) by mouth daily.   blood glucose meter kit and supplies Dispense based on patient and insurance preference. Use up to four times daily as directed. (FOR ICD-10 E10.9, E11.9).   donepezil (ARICEPT) 10 MG tablet Take half tablet (5 mg) daily for 2 weeks, then increase to the full tablet at 10 mg daily   esomeprazole (NEXIUM) 40 MG capsule Take 1 capsule (40 mg total) by mouth daily.   Fluticasone-Umeclidin-Vilant (TRELEGY ELLIPTA) 100-62.5-25 MCG/INH AEPB Inhale 1  puff into the lungs daily. (Patient not taking: Reported on 08/28/2020)   FREESTYLE LITE test strip USE 1 STRIP TWICE A DAY AS NEEDED TO CHECK SUGAR   insulin glargine (LANTUS) 100 UNIT/ML Solostar Pen Inject 20 Units into the skin daily.   Insulin Pen Needle 32G X 4 MM MISC 1 each by Does not apply route daily at 6 (six) AM for 100 doses.   Lancets MISC Use asd 1 per day 250.02   metFORMIN (GLUCOPHAGE) 1000 MG tablet Take 1 tablet (1,000 mg total) by mouth 2 (two) times daily with a meal.   mirabegron ER (MYRBETRIQ) 25 MG TB24 tablet Take 1 tablet (25 mg total) by mouth daily.   montelukast (SINGULAIR) 10 MG tablet Take 1 tablet (10 mg total) by mouth at bedtime.   Multiple Vitamins-Minerals (CENTRUM MEN PO) Take 1 tablet by mouth daily.   Omega-3 Fatty Acids (FISH OIL) 500 MG CAPS Take by mouth daily.   tamsulosin (FLOMAX) 0.4 MG CAPS capsule Take 1 capsule (0.4 mg total) by mouth daily.   No facility-administered encounter medications on file as of 09/05/2020.    Allergies (verified) Patient has no known allergies.   History: Past Medical History:  Diagnosis Date   ALLERGIC RHINITIS 10/04/2008   ASTHMA 10/04/2008   Asthma    BACK PAIN 04/18/2008   BENIGN PROSTATIC HYPERTROPHY 03/09/2007   COLONIC POLYPS, HX OF 03/09/2007   COPD 10/20/2007   DIABETES MELLITUS, TYPE II  03/09/2007   FATIGUE 10/05/2007   GERD (gastroesophageal reflux disease) 03/01/2012   GOUT 10/19/2006   HYPERLIPIDEMIA 10/19/2006   HYPERTENSION 10/19/2006   OSA (obstructive sleep apnea) 07/19/2015   Prostate cancer ([place]) 01/19/2012   SWELLING MASS OR LUMP IN HEAD AND NECK 01/09/2010   Past Surgical History:  Procedure Laterality Date   APPENDECTOMY     Family History  Problem Relation Age of Onset   Hypertension Mother    Cancer Other    Social History   Socioeconomic History   Marital status: Single    Spouse name: Not on file   Number of children: 0   Years of education: 16 years   Highest education  level: Bachelor's degree (e.g., BA, AB, BS)  Occupational History   Occupation: retired [employer] aug 2009  Tobacco Use   Smoking status: Former    Packs/day: 1.50    Years: 32.00    Pack years: 48.00    Types: Cigarettes    Quit date: 1987    Years since quitting: 35.4   Smokeless tobacco: Never  Vaping Use   Vaping Use: Never used  Substance and Sexual Activity   Alcohol use: No    Comment: stop 7 years   Drug use: Never   Sexual activity: Not Currently  Other Topics Concern   Not on file  Social History Narrative   Right handed    Lives alone.   No children; has two sisters that live nearby.   Social Determinants of Health   Financial Resource Strain: Low Risk    Difficulty of Paying Living Expenses: Not hard at all  Food Insecurity: No Food Insecurity   Worried About Charity fundraiser in the Last Year: Never true   [place] in the Last Year: Never true  Transportation Needs: No Transportation Needs   Lack of Transportation (Medical): No   Lack of Transportation (Non-Medical): No  Physical Activity: Sufficiently Active   Days of Exercise per Week: 5 days   Minutes of Exercise per Session: 30 min  Stress: No Stress Concern Present   Feeling of Stress : Not at all  Social Connections: Moderately Integrated   Frequency of Communication with Friends and Family: More than three times a week   Frequency of Social Gatherings with Friends and Family: More than three times a week   Attends Religious Services: More than 4 times per year   Active Member of [employer] or Organizations: No   Attends Music therapist: More than 4 times per year   Marital Status: Never married    Tobacco Counseling Counseling given: Not Answered   Clinical Intake:  Pre-visit preparation completed: Yes  Pain : No/denies pain Pain Score: 0-No pain     BMI - recorded: 29.29 Nutritional Status: BMI 25 -29 Overweight Nutritional Risks: None Diabetes: Yes CBG  done?: No Did pt. bring in CBG monitor from home?: No  How often do you need to have someone help you when you read instructions, pamphlets, or other written materials from your doctor or pharmacy?: 1 - Never What is the last grade level you completed in school?: Bachelor's degree  Diabetic? yes  Interpreter Needed?: No  Information entered by :: Lisette Abu, LPN   Activities of Daily Living In your present state of health, do you have any difficulty performing the following activities: 09/05/2020 07/21/2020  Hearing? N N  Vision? N N  Difficulty concentrating or making decisions? Aggie Moats  Walking or climbing stairs? N Y  Dressing or bathing? N N  Doing errands, shopping? N Y  Conservation officer, nature and eating ? N -  Using the Toilet? N -  In the past six months, have you accidently leaked urine? N -  Do you have problems with loss of bowel control? N -  Managing your Medications? N -  Managing your Finances? N -  Housekeeping or managing your Housekeeping? N -  Some recent data might be hidden    Patient Care Team: [name], MD as PCP - General Nahser, Wonda Cheng, MD as PCP - Cardiology (Cardiology) Florance, Tomasa Blase, RN as [facility] Shawn Route, Nancy Marus (Neurology)  Indicate any recent Medical Services you may have received from other than [facility] providers in the past year (date may be approximate).     Assessment:   This is a routine wellness examination for [place].  Hearing/Vision screen No results found.  Dietary issues and exercise activities discussed: Current Exercise Habits: Home exercise routine, Type of exercise: walking, Time (Minutes): 30, Frequency (Times/Week): 5, Weekly Exercise (Minutes/Week): 150, Intensity: Mild, Exercise limited by: respiratory conditions(s);cardiac condition(s)   Goals Addressed   None   Depression Screen PHQ 2/9 Scores 09/05/2020 06/11/2020 11/28/2019 08/08/2019 06/15/2019 05/10/2019 05/10/2019  PHQ  - 2 Score 0 0 0 0 0 0 0  PHQ- 9 Score - - - - - - -    Fall Risk Fall Risk  09/05/2020 08/28/2020 07/17/2020 06/11/2020 01/17/2020  Falls in the past year? 0 0 0 1 1  Number falls in past yr: 0 0 0 0 0  Injury with Fall? 0 0 0 1 0  Risk for fall due to : No Fall Risks - Impaired vision;Medication side effect History of fall(s) History of fall(s)  Follow up - - Falls evaluation completed;Education provided Falls prevention discussed;Education provided;Falls evaluation completed Falls evaluation completed;Education provided;Falls prevention discussed    FALL RISK PREVENTION PERTAINING TO THE HOME:  Any stairs in or around the home? No  If so, are there any without handrails? No  Home free of loose throw rugs in walkways, pet beds, electrical cords, etc? Yes  Adequate lighting in your home to reduce risk of falls? Yes   ASSISTIVE DEVICES UTILIZED TO PREVENT FALLS:  Life alert? No  Use of a cane, walker or w/c? No  Grab bars in the bathroom? No  Shower chair or bench in shower? No  Elevated toilet seat or a handicapped toilet? No   TIMED UP AND GO:  Was the test performed? Yes .  Length of time to ambulate 10 feet: 6 sec.   Gait steady and fast without use of assistive device  Cognitive Function: Normal cognitive status assessed by direct observation by this Nurse Health Advisor. No abnormalities found.   MMSE - Mini Mental State Exam 07/22/2017  Orientation to time 5  Orientation to Place 5  Registration 3  Attention/ Calculation 3  Recall 3  Language- name 2 objects 2  Language- repeat 1  Language- follow 3 step command 3  Language- read & follow direction 1  Write a sentence 1  Copy design 1  Total score 28   Montreal Cognitive Assessment  08/28/2020  Visuospatial/ Executive (0/5) 3  Naming (0/3) 0  Attention: Read list of digits (0/2) 2  Attention: Read list of letters (0/1) 1  Attention: Serial 7 subtraction starting at 100 (0/3) 1  Language: Repeat phrase (0/2) 1  Language : Fluency (0/1) 0  Abstraction (0/2) 1  Delayed Recall (0/5) 1  Orientation (0/6) 6  Total 16   6CIT Screen 08/08/2019  What Year? 0 points  What month? 0 points  What time? 3 points  Count back from 20 0 points  Months in reverse 0 points  Repeat phrase 4 points  Total Score 7    Immunizations Immunization History  Administered Date(s) Administered   H1N1 04/02/2008   Influenza Whole 03/09/2007, 12/27/2007, 01/21/2009   Influenza, High Dose Seasonal PF 11/18/2016   Influenza, Seasonal, Injecte, Preservative Fre 02/02/2013   Influenza,inj,Quad PF,6+ Mos 01/18/2015   Influenza-Unspecified 01/15/2016, 01/19/2018, 11/01/2018, 12/22/2019   PFIZER(Purple Top)SARS-COV-2 Vaccination 05/05/2019, 06/02/2019   Pneumococcal Conjugate-13 04/04/2013, 11/01/2018   Pneumococcal Polysaccharide-23 03/09/2007   Td 04/02/2008   Tdap 05/17/2018   Zoster, Live 03/07/2013    TDAP status: Up to date  Flu Vaccine status: Up to date  Pneumococcal vaccine status: Up to date  Covid-19 vaccine status: Completed vaccines  Qualifies for Shingles Vaccine? Yes   Zostavax completed Yes   Shingrix Completed?: No.    Education has been provided regarding the importance of this vaccine. Patient has been advised to call insurance company to determine out of pocket expense if they have not yet received this vaccine. Advised may also receive vaccine at local pharmacy or Health Dept. Verbalized acceptance and understanding.  Screening Tests Health Maintenance  Topic Date Due   Zoster Vaccines- Shingrix (1 of 2) Never done   INFLUENZA VACCINE  10/21/2020   OPHTHALMOLOGY EXAM  11/23/2020   FOOT EXAM  11/27/2020   URINE MICROALBUMIN  11/27/2020   TETANUS/TDAP  05/17/2028   COVID-19 Vaccine  Completed   PNA vac Low Risk Adult  Completed   HPV VACCINES  Aged Out    Health Maintenance  Health Maintenance Due  Topic Date Due   Zoster Vaccines- Shingrix (1 of 2) Never done    Colorectal  cancer screening: No longer required.   Lung Cancer Screening: (Low Dose CT Chest recommended if Age 62-80 years, 30 pack-year currently smoking OR have quit w/in 15years.) does not qualify.   Lung Cancer Screening Referral: no  Additional Screening:  Hepatitis C Screening: does not qualify; Completed no  Vision Screening: Recommended annual ophthalmology exams for early detection of glaucoma and other disorders of the eye. Is the patient up to date with their annual eye exam?  Yes  Who is the provider or what is the name of the office in which the patient attends annual eye exams? [facility] If pt is not established with a provider, would they like to be referred to a provider to establish care? No .   Dental Screening: Recommended annual dental exams for proper oral hygiene  Community Resource Referral / Chronic Care Management: CRR required this visit?  No   CCM required this visit?  No      Plan:     I have personally reviewed and noted the following in the patient's chart:   Medical and social history Use of alcohol, tobacco or illicit drugs  Current medications and supplements including opioid prescriptions. Patient is not currently taking opioid prescriptions. Functional ability and status Nutritional status Physical activity Advanced directives List of other physicians Hospitalizations, surgeries, and ER visits in previous 12 months Vitals Screenings to include cognitive, depression, and falls Referrals and appointments  In addition, I have reviewed and discussed with patient certain preventive protocols, quality metrics, and best practice recommendations. A  written personalized care plan for preventive services as well as general preventive health recommendations were provided to patient.     Sheral Flow, LPN   8/36/[id number]   Nurse Notes: n/a

## 2020-09-05 NOTE — Patient Instructions (Signed)
[name] , Thank you for taking time to come for your Medicare Wellness Visit. I appreciate your ongoing commitment to your health goals. Please review the following plan we discussed and let me know if I can assist you in the future.   Screening recommendations/referrals: Colonoscopy: no longer required due to age Recommended yearly ophthalmology/optometry visit for glaucoma screening and checkup Recommended yearly dental visit for hygiene and checkup  Vaccinations: Influenza vaccine: 12/22/2019 Pneumococcal vaccine: 03/09/2007, 11/01/2018 Tdap vaccine: 05/17/2018; due every 10 years Shingles vaccine: never done;  Zoster vaccine: 03/07/2013 Covid-19: 05/05/2019, 06/02/2019, 12/27/2019  Advanced directives: Advance directive discussed with you today. Even though you declined this today please call our office should you change your mind and we can give you the proper paperwork for you to fill out.  Conditions/risks identified: Change diet due to new onset of diabetes, decrease sugar intake, increase your water intake and sugar substitute. Stay away from carbohydrates and fried foods.  Next appointment: Please schedule your next Medicare Wellness Visit with your Nurse Health Advisor in 1 year by calling [phone].  Preventive Care 30 Years and Older, Male Preventive care refers to lifestyle choices and visits with your health care provider that can promote health and wellness. What does preventive care include? A yearly physical exam. This is also called an annual well check. Dental exams once or twice a year. Routine eye exams. Ask your health care provider how often you should have your eyes checked. Personal lifestyle choices, including: Daily care of your teeth and gums. Regular physical activity. Eating a healthy diet. Avoiding tobacco and drug use. Limiting alcohol use. Practicing safe sex. Taking low doses of aspirin every day. Taking vitamin and mineral supplements as  recommended by your health care provider. What happens during an annual well check? The services and screenings done by your health care provider during your annual well check will depend on your age, overall health, lifestyle risk factors, and family history of disease. Counseling  Your health care provider may ask you questions about your: Alcohol use. Tobacco use. Drug use. Emotional well-being. Home and relationship well-being. Sexual activity. Eating habits. History of falls. Memory and ability to understand (cognition). Work and work Statistician. Screening  You may have the following tests or measurements: Height, weight, and BMI. Blood pressure. Lipid and cholesterol levels. These may be checked every 5 years, or more frequently if you are over 78 years old. Skin check. Lung cancer screening. You may have this screening every year starting at age 83 if you have a 30-pack-year history of smoking and currently smoke or have quit within the past 15 years. Fecal occult blood test (FOBT) of the stool. You may have this test every year starting at age 17. Flexible sigmoidoscopy or colonoscopy. You may have a sigmoidoscopy every 5 years or a colonoscopy every 10 years starting at age 20. Prostate cancer screening. Recommendations will vary depending on your family history and other risks. Hepatitis C blood test. Hepatitis B blood test. Sexually transmitted disease (STD) testing. Diabetes screening. This is done by checking your blood sugar (glucose) after you have not eaten for a while (fasting). You may have this done every 1-3 years. Abdominal aortic aneurysm (AAA) screening. You may need this if you are a current or former smoker. Osteoporosis. You may be screened starting at age 55 if you are at high risk. Talk with your health care provider about your test results, treatment options, and if necessary, the need for more tests. Vaccines  Your health care provider may recommend  certain vaccines, such as: Influenza vaccine. This is recommended every year. Tetanus, diphtheria, and acellular pertussis (Tdap, Td) vaccine. You may need a Td booster every 10 years. Zoster vaccine. You may need this after age 6. Pneumococcal 13-valent conjugate (PCV13) vaccine. One dose is recommended after age 29. Pneumococcal polysaccharide (PPSV23) vaccine. One dose is recommended after age 22. Talk to your health care provider about which screenings and vaccines you need and how often you need them. This information is not intended to replace advice given to you by your health care provider. Make sure you discuss any questions you have with your health care provider. Document Released: 04/05/2015 Document Revised: 11/27/2015 Document Reviewed: 01/08/2015 Elsevier Interactive Patient Education  2017 [employer] Prevention in the Home Falls can cause injuries. They can happen to people of all ages. There are many things you can do to make your home safe and to help prevent falls. What can I do on the outside of my home? Regularly fix the edges of walkways and driveways and fix any cracks. Remove anything that might make you trip as you walk through a door, such as a raised step or threshold. Trim any bushes or trees on the path to your home. Use bright outdoor lighting. Clear any walking paths of anything that might make someone trip, such as rocks or tools. Regularly check to see if handrails are loose or broken. Make sure that both sides of any steps have handrails. Any raised decks and porches should have guardrails on the edges. Have any leaves, snow, or ice cleared regularly. Use sand or salt on walking paths during winter. Clean up any spills in your garage right away. This includes oil or grease spills. What can I do in the bathroom? Use night lights. Install grab bars by the toilet and in the tub and shower. Do not use towel bars as grab bars. Use non-skid mats or  decals in the tub or shower. If you need to sit down in the shower, use a plastic, non-slip stool. Keep the floor dry. Clean up any water that spills on the floor as soon as it happens. Remove soap buildup in the tub or shower regularly. Attach bath mats securely with double-sided non-slip rug tape. Do not have throw rugs and other things on the floor that can make you trip. What can I do in the bedroom? Use night lights. Make sure that you have a light by your bed that is easy to reach. Do not use any sheets or blankets that are too big for your bed. They should not hang down onto the floor. Have a firm chair that has side arms. You can use this for support while you get dressed. Do not have throw rugs and other things on the floor that can make you trip. What can I do in the kitchen? Clean up any spills right away. Avoid walking on wet floors. Keep items that you use a lot in easy-to-reach places. If you need to reach something above you, use a strong step stool that has a grab bar. Keep electrical cords out of the way. Do not use floor polish or wax that makes floors slippery. If you must use wax, use non-skid floor wax. Do not have throw rugs and other things on the floor that can make you trip. What can I do with my stairs? Do not leave any items on the stairs. Make sure that there are  handrails on both sides of the stairs and use them. Fix handrails that are broken or loose. Make sure that handrails are as long as the stairways. Check any carpeting to make sure that it is firmly attached to the stairs. Fix any carpet that is loose or worn. Avoid having throw rugs at the top or bottom of the stairs. If you do have throw rugs, attach them to the floor with carpet tape. Make sure that you have a light switch at the top of the stairs and the bottom of the stairs. If you do not have them, ask someone to add them for you. What else can I do to help prevent falls? Wear shoes that: Do not  have high heels. Have rubber bottoms. Are comfortable and fit you well. Are closed at the toe. Do not wear sandals. If you use a stepladder: Make sure that it is fully opened. Do not climb a closed stepladder. Make sure that both sides of the stepladder are locked into place. Ask someone to hold it for you, if possible. Clearly mark and make sure that you can see: Any grab bars or handrails. First and last steps. Where the edge of each step is. Use tools that help you move around (mobility aids) if they are needed. These include: Canes. Walkers. Scooters. Crutches. Turn on the lights when you go into a dark area. Replace any light bulbs as soon as they burn out. Set up your furniture so you have a clear path. Avoid moving your furniture around. If any of your floors are uneven, fix them. If there are any pets around you, be aware of where they are. Review your medicines with your doctor. Some medicines can make you feel dizzy. This can increase your chance of falling. Ask your doctor what other things that you can do to help prevent falls. This information is not intended to replace advice given to you by your health care provider. Make sure you discuss any questions you have with your health care provider. Document Released: 01/03/2009 Document Revised: 08/15/2015 Document Reviewed: 04/13/2014 Elsevier Interactive Patient Education  2017 [employer].

## 2020-09-11 ENCOUNTER — Other Ambulatory Visit: Payer: Self-pay

## 2020-09-11 NOTE — Patient Outreach (Signed)
[facility]) Care Management  09/11/2020  Allen Hunter. 03/31/1940 [id number]   Telephone Assessment    Unsuccessful outreach attempt to patient. RN CM left HIPAA compliant voicemail message along with contact info.    Plan: RN CM will make outreach attempt to patient within the month of July if no return call from patient.     Enzo Montgomery, RN,BSN,CCM [place] Management Telephonic Care Management Coordinator Direct Phone: [phone] Toll Free: [phone] Fax: [phone]

## 2020-09-14 ENCOUNTER — Other Ambulatory Visit: Payer: Medicare HMO

## 2020-09-18 ENCOUNTER — Other Ambulatory Visit: Payer: Medicare HMO

## 2020-09-24 ENCOUNTER — Ambulatory Visit
Admission: RE | Admit: 2020-09-24 | Discharge: 2020-09-24 | Disposition: A | Discharge status: Home / Self Care | Payer: Medicare HMO | Source: Ambulatory Visit | Attending: Internal Medicine | Admitting: Internal Medicine

## 2020-09-24 ENCOUNTER — Other Ambulatory Visit: Payer: Self-pay

## 2020-09-24 DIAGNOSIS — L723 Sebaceous cyst: Secondary | ICD-10-CM | POA: Diagnosis not present

## 2020-09-24 DIAGNOSIS — I6782 Cerebral ischemia: Secondary | ICD-10-CM | POA: Diagnosis not present

## 2020-09-24 DIAGNOSIS — R413 Other amnesia: Secondary | ICD-10-CM

## 2020-09-24 DIAGNOSIS — G319 Degenerative disease of nervous system, unspecified: Secondary | ICD-10-CM | POA: Diagnosis not present

## 2020-09-25 ENCOUNTER — Encounter: Payer: Self-pay | Admitting: Internal Medicine

## 2020-10-01 ENCOUNTER — Other Ambulatory Visit: Payer: Self-pay

## 2020-10-01 ENCOUNTER — Other Ambulatory Visit: Payer: Self-pay | Admitting: Internal Medicine

## 2020-10-01 MED ORDER — FREESTYLE LITE TEST VI STRP
ORAL_STRIP | 6 refills | Status: DC
Start: 2020-10-01 — End: 2020-10-01

## 2020-10-01 MED ORDER — FREESTYLE LITE TEST VI STRP: ORAL_STRIP | 6 refills | Status: DC

## 2020-10-01 MED ORDER — FREESTYLE LITE TEST VI STRP: ORAL_STRIP | 6 refills | Status: AC

## 2020-10-02 NOTE — Telephone Encounter (Signed)
PA request received from [facility].  However, request has been made prior & was denied due no bladder training interventions.  Pharmacy notified; PA request will be closed.  Mychart message sent notifying pt of denial.

## 2020-10-09 ENCOUNTER — Other Ambulatory Visit: Payer: Self-pay

## 2020-10-09 NOTE — Patient Outreach (Addendum)
[facility]) Care Management  10/09/2020  Allen Hunter [birth year] [id number]   Telephone Assessment   Successful outreach call placed to patient. Spoke briefly as patient stated he had something to do. Patient reported he was in the hospital "about three weeks ago". However, he was confused about date and was in hospital end of April for DKA. He states he is feeling and doing better. Blood sugars are better controlled since meds adjusted. Patient did not have time to review meds during this call. He reports he has been taking his meds to local pharmacy to be reviewed to make sure he is taking things correctly. Appetite remains WNL. Wgt stable. He denies any falls. Patient reviewed upcoming MD appts schedule. He denies any RN CM needs or concerns at this time.    Medications Reviewed Today     Reviewed by Mickeal Needy, LPN (Licensed Practical Nurse) on 09/05/20 at 1031  Med List Status: <None>   Medication Order Taking? Sig Documenting Provider Last Dose Status Informant  allopurinol (ZYLOPRIM) 300 MG tablet [id number] No Take 1 tablet (300 mg total) by mouth daily. Corwin Levins, MD Taking Active   aspirin 81 MG tablet [id number] No Take 81 mg by mouth daily. [provider] Taking Active Self  atorvastatin (LIPITOR) 80 MG tablet [id number] No Take 1 tablet (80 mg total) by mouth daily. Corwin Levins, MD Taking Active   bisoprolol (ZEBETA) 5 MG tablet [id number] No Take 1 tablet (5 mg total) by mouth daily. Corwin Levins, MD Taking Active   blood glucose meter kit and supplies [id number] No Dispense based on patient and insurance preference. Use up to four times daily as directed. (FOR ICD-10 E10.9, E11.9). Darlin Priestly, MD Taking Active   donepezil (ARICEPT) 10 MG tablet [id number]  Take half tablet (5 mg) daily for 2 weeks, then increase to the full tablet at 10 mg daily Elwyn Reach  Active   empagliflozin (JARDIANCE) 25 MG TABS tablet [id number]  TAKE 1  TABLET DAILY Corwin Levins, MD  Active   esomeprazole (NEXIUM) 40 MG capsule [id number] No Take 1 capsule (40 mg total) by mouth daily. Corwin Levins, MD Taking Active   Fluticasone-Umeclidin-Vilant (TRELEGY ELLIPTA) 100-62.5-25 MCG/INH AEPB [id number] No Inhale 1 puff into the lungs daily.  Patient not taking: Reported on 08/28/2020   Corwin Levins, MD Not Taking Active   FREESTYLE LITE test strip [id number] No USE 1 STRIP TWICE A DAY AS NEEDED TO CHECK SUGAR Corwin Levins, MD Taking Active Self  insulin glargine (LANTUS) 100 UNIT/ML Solostar Pen [id number] No Inject 20 Units into the skin daily. Corwin Levins, MD Taking Expired 08/31/20 2359   Insulin Pen Needle 32G X 4 MM MISC [id number] No 1 each by Does not apply route daily at 6 (six) AM for 100 doses. Lanae Boast, MD Taking Active   Lancets MISC [id number] No Use asd 1 per day 250.02 Corwin Levins, MD Taking Active Self  metFORMIN (GLUCOPHAGE) 1000 MG tablet [id number] No Take 1 tablet (1,000 mg total) by mouth 2 (two) times daily with a meal. Corwin Levins, MD Taking Expired 08/31/20 2359   mirabegron ER (MYRBETRIQ) 25 MG TB24 tablet [id number] No Take 1 tablet (25 mg total) by mouth daily. Corwin Levins, MD Taking Active   montelukast (SINGULAIR) 10 MG tablet [id number] No Take 1 tablet (10 mg total) by mouth at bedtime. Corwin Levins, MD Taking Active  Multiple Vitamins-Minerals (CENTRUM MEN PO) [id number] No Take 1 tablet by mouth daily. [provider] Taking Active   Omega-3 Fatty Acids (FISH OIL) 500 MG CAPS [id number] No Take by mouth daily. [provider] Taking Active   tamsulosin (FLOMAX) 0.4 MG CAPS capsule [id number] No Take 1 capsule (0.4 mg total) by mouth daily. [name], MD Taking Active              Goals Addressed               This Visit's Progress     COMPLETED: [facility] and Keep All Appointments (pt-stated)        Timeframe:  Long-Range Goal Priority:  Medium Start Date: 05/14/20                             Expected End Date:  09/19/20                     Follow Up Date 09/19/20    Encouraged patient to see pulmonologist regarding CPAP which currently is not working.    Why is this important?   Part of staying healthy is seeing the doctor for follow-up care.  If you forget your appointments, there are some things you can do to stay on track.    Notes: 05/14/20: Patient states he is not wearing his C-PAP because he is missing pieces. Nurse will assist patient with contacting pulmonologist to start the process of getting C-PAP device.  Updated 05/15/20: Nurse did a chart review and has various questions regarding if patient went to his sleep study appointment scheduled on 12/16/19 per Dr. Gust Brooms note. Nurse called patient 3 times and left a message and requested a callback [phone] stating she needs further information before she reaches out to pulmonary.   Updated 06/11/20: Placed a call to Dr. Domingo Dimes office and spoke to [place] who will inform doctor that the patient's C-PAP is broken.  07/17/20-Patient is going today for sleep study test. He has not seen PCP in over 6 months-will call and make and appt ASAP.  10/09/20-Patient reports adherence to MD appt schedule and able to review upcoming appts with RN CM.      [facility] My Medicine (pt-stated)        Timeframe:  Long-Range Goal Priority:  High Start Date:  01/17/20                           Expected End Date:  01/16/21                     Follow Up Date Sept 2022  Barriers: Health Behaviors Knowledge    - call for medicine refill 2 or 3 days before it runs out - keep a list of all the medicines I take; vitamins and herbals too - learn to read medicine labels - use a pillbox to sort medicine   - Encouraged patient to take his Trelegy inhaler daily -Discussed placing a note on the refrigerator to remind patient to take weekly Trulicity  Why is this important?   These steps will help you keep on track with your  medicines.    Notes: Updated 05/14/20: Patient reports that he has not been taking his Trelegy inhaler. Nurse provided education how this would improve his breathing if taken daily. Nurse will send patient a pillbox to assist him with organizing  his medications. The After visit summary  will have a list of the patient's medication.  Updated 06/11/20: Patient states he received the pillbox and will begin to use it. Patient reports that he is not taking Trelegy daily and he placed this inhaler with the medications he takes daily as a reminder. Verbalized that he will place a note on his refrigerator to remind him to take his weekly Trulicity.  07/17/20-Patient has not been taking Trulicityf or the past 2-3 wks per his report. He is unable to give clear explanation as to why but just states he stopped taking it on his own. Reinforced importance of medication given that his cbgs have bene running in the 200-300s. Blood sugar during this call 357-fasting.   10/09/20-RN CM unable to complete med reviewed during this call as patient had to end call early. He voices that he did take RN CM recommendation and took meds to local pharmacy for review. He voices that he is taking meds as directed.       ([facility])Monitor and Manage My Blood Sugar (pt-stated)        Timeframe:  Long-Range Goal Priority:  High Start Date:  01/17/20                           Expected End Date:  01/16/21                     Follow Up Date Sept 2022  Barriers: Health Behaviors Knowledge    - check blood sugar at prescribed times - check blood sugar if I feel it is too high or too low - enter blood sugar readings and medication or insulin into daily log - take the blood sugar log to all doctor visits  -Encouraged patient to write his blood sugar values down in a calendar booklet explaining this would make it easier to follow his diabetic control versus going to his meter to view. -Discussed counting his carbohydrates by reading food  labels.    Why is this important?   Checking your blood sugar at home helps to keep it from getting very high or very low.  Writing the results in a diary or log helps the doctor know how to care for you.  Your blood sugar log should have the time, date and the results.  Also, write down the amount of insulin or other medicine that you take.  Other information, like what you ate, exercise done and how you were feeling, will also be helpful.     Notes: Patient reports that he will start to write his blood sugar values in a log so he can look quickly to know how he is controlling his diabetes. He is motivated to start to look at food labels and  limit his carbohydrates < 60 grams per meal and <30 grams per snack, he states he wants to start to walk twice weekly for about a half of a mile, patient states he will use the diabetic plate method and will fill half of his plate with vegetables, patient wants to decrease the amount of coffee he drinks  to 1-2 cups daily and increase his water intake to the goal of 3 bottles daily.  Updated: 01/23/20  Updated 05/14/20: Patient states that his blood sugars have not been controlled recently. He verbalized understanding that recording his blood sugar in a calendar booklet would make it easier to follow his blood sugar trends. Patient states  that he a has gotten away from counting his carbohydrates and will start to monitor his carbohydrate intake closer. He reports that he has decrease the amount of coffee he drinks and does drink about 5-6, 8 ounce bottles of water daily. Nurse will send patient a calendar booklet to record his blood sugars and counting carbohydrates education.  Updated 06/11/20: Patient confirms that he received the calendar booklet and admits that he has not begun to record his blood sugar values. He states he will begin to monitor his diet closer and count his carbohydrates as well as record his blood sugar values in the calendar booklet.    07/17/20-Patient reports checking cbgs 2-3x/day. He checked cbg during this call. He admits to eating pasta yesterday and thinks that is the cause of elevated cbg this morning.  10/09/20-Patient reports cbgs have been in the 100s. He needs to replace battery on device so did not get accurate reading today.          Plan: RN CM discussed with patient next outreach within the month of Sept. Patient gave verbal consent and in agreement with RN CM follow up and timeframe. Patient aware that they may contact RN CM sooner for any issues or concerns. RN CM reviewed goals and plan of care with patient. Patient agrees to care plan and follow up. RN CM will send quarterly update to PCP.  Enzo Montgomery, RN,BSN,CCM [place] [facility] Telephonic Care Management Coordinator Direct Phone: [phone] Toll Free: [phone] Fax: [phone]

## 2020-10-28 ENCOUNTER — Telehealth: Payer: Self-pay | Admitting: Internal Medicine

## 2020-10-28 ENCOUNTER — Other Ambulatory Visit: Payer: Self-pay

## 2020-10-28 DIAGNOSIS — Z7982 Long term (current) use of aspirin: Secondary | ICD-10-CM | POA: Insufficient documentation

## 2020-10-28 DIAGNOSIS — Z87891 Personal history of nicotine dependence: Secondary | ICD-10-CM | POA: Insufficient documentation

## 2020-10-28 DIAGNOSIS — J45909 Unspecified asthma, uncomplicated: Secondary | ICD-10-CM | POA: Insufficient documentation

## 2020-10-28 DIAGNOSIS — Z20822 Contact with and (suspected) exposure to covid-19: Secondary | ICD-10-CM | POA: Diagnosis not present

## 2020-10-28 DIAGNOSIS — I5032 Chronic diastolic (congestive) heart failure: Secondary | ICD-10-CM | POA: Diagnosis not present

## 2020-10-28 DIAGNOSIS — I110 Hypertensive heart disease with heart failure: Secondary | ICD-10-CM | POA: Diagnosis not present

## 2020-10-28 DIAGNOSIS — Z8546 Personal history of malignant neoplasm of prostate: Secondary | ICD-10-CM | POA: Diagnosis not present

## 2020-10-28 DIAGNOSIS — Z7984 Long term (current) use of oral hypoglycemic drugs: Secondary | ICD-10-CM | POA: Insufficient documentation

## 2020-10-28 DIAGNOSIS — Z79899 Other long term (current) drug therapy: Secondary | ICD-10-CM | POA: Diagnosis not present

## 2020-10-28 DIAGNOSIS — J449 Chronic obstructive pulmonary disease, unspecified: Secondary | ICD-10-CM | POA: Insufficient documentation

## 2020-10-28 DIAGNOSIS — R0981 Nasal congestion: Secondary | ICD-10-CM | POA: Diagnosis present

## 2020-10-28 DIAGNOSIS — J019 Acute sinusitis, unspecified: Secondary | ICD-10-CM | POA: Diagnosis not present

## 2020-10-28 DIAGNOSIS — J3489 Other specified disorders of nose and nasal sinuses: Secondary | ICD-10-CM | POA: Insufficient documentation

## 2020-10-28 DIAGNOSIS — Z794 Long term (current) use of insulin: Secondary | ICD-10-CM | POA: Diagnosis not present

## 2020-10-28 DIAGNOSIS — E1110 Type 2 diabetes mellitus with ketoacidosis without coma: Secondary | ICD-10-CM | POA: Insufficient documentation

## 2020-10-28 DIAGNOSIS — J329 Chronic sinusitis, unspecified: Secondary | ICD-10-CM | POA: Diagnosis not present

## 2020-10-28 NOTE — ED Triage Notes (Signed)
Pt states he has had a runny nose for the past 3 weeks, pt states he has tried otc meds with no relief. Pt states that he also has a productive cough with white sputum, and pt has sore throat.

## 2020-10-29 ENCOUNTER — Emergency Department
Admission: EM | Admit: 2020-10-29 | Discharge: 2020-10-29 | Disposition: A | Discharge status: Home / Self Care | Payer: Medicare HMO | Attending: Emergency Medicine | Admitting: Emergency Medicine

## 2020-10-29 DIAGNOSIS — J019 Acute sinusitis, unspecified: Secondary | ICD-10-CM

## 2020-10-29 DIAGNOSIS — J3489 Other specified disorders of nose and nasal sinuses: Secondary | ICD-10-CM | POA: Diagnosis not present

## 2020-10-29 LAB — RESP PANEL BY RT-PCR (FLU A&B, COVID) ARPGX2
Influenza A by PCR: NEGATIVE
Influenza B by PCR: NEGATIVE
SARS Coronavirus 2 by RT PCR: NEGATIVE

## 2020-10-29 MED ORDER — AMOXICILLIN-POT CLAVULANATE 875-125 MG PO TABS: 1.0000 | ORAL_TABLET | Freq: Two times a day (BID) | ORAL | 0 refills | Status: AC

## 2020-10-29 MED ORDER — AMOXICILLIN-POT CLAVULANATE 875-125 MG PO TABS
1.0000 | ORAL_TABLET | Freq: Once | ORAL | Status: AC
  Administered 2020-10-29: 1 via ORAL
  Filled 2020-10-29: qty 1

## 2020-10-29 NOTE — ED Notes (Signed)
Signing pad not working. PtA&Ox4 ambulatory at d/c with independent steady gait, NAD. Pt verbalized understanding of d/c instructions, prescription and follow up care.

## 2020-10-29 NOTE — Discharge Instructions (Addendum)
As we discussed please take your antibiotic as prescribed for its entire course.  Please use Afrin nasal spray twice daily for the next 3 days.  Please use Flonase nasal spray for the next 2 weeks.  Both of these are over-the-counter medications.  Please follow-up with your doctor in the next several days for recheck/reevaluation.

## 2020-10-29 NOTE — ED Provider Notes (Signed)
[facility] Emergency Department Provider Note  Time seen: 12:45 AM  I have reviewed the triage vital signs and the nursing notes.   HISTORY  Chief Complaint Nasal Congestion   HPI Allen Hunter. is a 80 y.o. male with a past medical history of asthma, allergies, COPD, diabetes, hypertension, hyperlipidemia presents to the emergency department for nasal congestion.  According to the patient for the past 3 weeks he has had constant nasal congestion as well as frequent cough.  Denies any shortness of breath.  No known fever.    Past Medical History:  Diagnosis Date   ALLERGIC RHINITIS 10/04/2008   ASTHMA 10/04/2008   Asthma    BACK PAIN 04/18/2008   BENIGN PROSTATIC HYPERTROPHY 03/09/2007   COLONIC POLYPS, HX OF 03/09/2007   COPD 10/20/2007   DIABETES MELLITUS, TYPE II 03/09/2007   FATIGUE 10/05/2007   GERD (gastroesophageal reflux disease) 03/01/2012   GOUT 10/19/2006   HYPERLIPIDEMIA 10/19/2006   HYPERTENSION 10/19/2006   OSA (obstructive sleep apnea) 07/19/2015   Prostate cancer ([place]) 01/19/2012   SWELLING MASS OR LUMP IN HEAD AND NECK 01/09/2010    Patient Active Problem List   Diagnosis Date Noted   Memory loss 08/01/2020   DKA (diabetic ketoacidosis) ([place]) 07/20/2020   Hyponatremia 07/20/2020   Dehydration 07/20/2020   Constipation 05/10/2019   Chronic diastolic CHF (congestive heart failure) ([place]) 11/15/2018   DOE (dyspnea on exertion) 05/17/2018   OSA (obstructive sleep apnea) 07/19/2015   Asthma with acute exacerbation 04/10/2014   Unspecified asthma, with exacerbation 09/05/2013   GERD (gastroesophageal reflux disease) 03/01/2012   Diarrhea 03/01/2012   Hypersomnolence 01/19/2012   Prostate cancer ([place]) 01/19/2012   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   Increased prostate specific antigen (PSA) velocity 01/21/2011   Erectile dysfunction 01/20/2011   Bladder neck obstruction 01/20/2011   Preventative health care 01/18/2011   Localized swelling,  mass and lump, head 01/09/2010   Allergic rhinitis 10/04/2008   Asthma 10/04/2008   BACK PAIN 04/18/2008   COPD (chronic obstructive pulmonary disease) ([place]) 10/20/2007   Fatigue 10/05/2007   Diabetes ([place]) 03/09/2007   BENIGN PROSTATIC HYPERTROPHY 03/09/2007   COLONIC POLYPS, HX OF 03/09/2007   Hyperlipidemia 10/19/2006   GOUT 10/19/2006   Essential hypertension 10/19/2006    Past Surgical History:  Procedure Laterality Date   APPENDECTOMY      Prior to Admission medications   Medication Sig Start Date End Date Taking? Authorizing Provider  empagliflozin (JARDIANCE) 25 MG TABS tablet TAKE 1 TABLET DAILY 08/29/20   [name], MD  allopurinol (ZYLOPRIM) 300 MG tablet Take 1 tablet (300 mg total) by mouth daily. 08/01/20   [name], MD  aspirin 81 MG tablet Take 81 mg by mouth daily.    [provider]  atorvastatin (LIPITOR) 80 MG tablet Take 1 tablet (80 mg total) by mouth daily. 08/01/20   [name], MD  bisoprolol (ZEBETA) 5 MG tablet Take 1 tablet (5 mg total) by mouth daily. 08/01/20   [name], MD  blood glucose meter kit and supplies Dispense based on patient and insurance preference. Use up to four times daily as directed. (FOR ICD-10 E10.9, E11.9). 07/23/20   [name], MD  donepezil (ARICEPT) 10 MG tablet Take half tablet (5 mg) daily for 2 weeks, then increase to the full tablet at 10 mg daily 08/28/20   Rondel Jumbo, PA-C  esomeprazole (NEXIUM) 40 MG capsule Take 1 capsule (40 mg total) by mouth  daily. 08/01/20   [name], MD  Fluticasone-Umeclidin-Vilant (TRELEGY ELLIPTA) 100-62.5-25 MCG/INH AEPB Inhale 1 puff into the lungs daily. Patient not taking: Reported on 08/28/2020 08/01/20   [name], MD  glucose blood (FREESTYLE LITE) test strip USE 1 STRIP TWICE A DAY AS NEEDED TO CHECK SUGAR 10/01/20   [name], MD  insulin glargine (LANTUS) 100 UNIT/ML Solostar Pen Inject 20 Units into the skin daily. 08/01/20 08/31/20  [name], MD  Insulin  Pen Needle 32G X 4 MM MISC 1 each by Does not apply route daily at 6 (six) AM for 100 doses. 07/29/20 11/06/20  Antonieta Pert, MD  Lancets MISC Use asd 1 per day 250.02 10/26/13   [name], MD  metFORMIN (GLUCOPHAGE) 1000 MG tablet Take 1 tablet (1,000 mg total) by mouth 2 (two) times daily with a meal. 08/01/20 08/31/20  [name], MD  mirabegron ER (MYRBETRIQ) 25 MG TB24 tablet Take 1 tablet (25 mg total) by mouth daily. 08/01/20   [name], MD  montelukast (SINGULAIR) 10 MG tablet Take 1 tablet (10 mg total) by mouth at bedtime. 08/01/20   [name], MD  Multiple Vitamins-Minerals (CENTRUM MEN PO) Take 1 tablet by mouth daily.    [provider]  Omega-3 Fatty Acids (FISH OIL) 500 MG CAPS Take by mouth daily.    [provider]  tamsulosin (FLOMAX) 0.4 MG CAPS capsule Take 1 capsule (0.4 mg total) by mouth daily. 08/01/20   [name], MD    No Known Allergies  Family History  Problem Relation Age of Onset   Hypertension Mother    Cancer Other     Social History Social History   Tobacco Use   Smoking status: Former    Packs/day: 1.50    Years: 32.00    Pack years: 48.00    Types: Cigarettes    Quit date: 1987    Years since quitting: 35.6   Smokeless tobacco: Never  Vaping Use   Vaping Use: Never used  Substance Use Topics   Alcohol use: No    Comment: stop 7 years   Drug use: Never    Review of Systems Constitutional: Negative for fever. Eyes: Negative for visual complaints ENT: Positive for nasal congestion/drainage x3 weeks. Cardiovascular: Negative for chest pain. Respiratory: Negative for shortness of breath.  Positive for cough with occasional sputum production. Gastrointestinal: Negative for abdominal pain, vomiting  Musculoskeletal: Negative for musculoskeletal complaints Neurological: Negative for headache All other ROS negative  ____________________________________________   PHYSICAL EXAM:  VITAL SIGNS: ED Triage Vitals   Enc Vitals Group     BP 10/28/20 2020 (!) 147/79     Pulse Rate 10/28/20 2020 65     Resp 10/28/20 2020 20     Temp 10/28/20 2020 98.3 F (36.8 C)     Temp Source 10/28/20 2020 Oral     SpO2 10/28/20 2020 98 %     Weight 10/28/20 2018 180 lb (81.6 kg)     Height 10/28/20 2018 '5\' 5"'  (1.651 m)     Head Circumference --      Peak Flow --      Pain Score 10/28/20 2018 0     Pain Loc --      Pain Edu? --      Excl. in [place]? --     Constitutional: Alert and oriented. Well appearing and in no distress. Eyes: Normal exam ENT  Head: Normocephalic and atraumatic.  Moderate nasal congestion/rhinorrhea.  No sinus tenderness.      Mouth/Throat: Mucous membranes are moist. Cardiovascular: Normal rate, regular rhythm.  Respiratory: Normal respiratory effort without tachypnea nor retractions. Breath sounds are clear  Gastrointestinal: Soft and nontender. No distention.   Musculoskeletal: Nontender with normal range of motion in all extremities.  Neurologic:  Normal speech and language. No gross focal neurologic deficits Skin:  Skin is warm, dry and intact.  Psychiatric: Mood and affect are normal.   ____________________________________________   INITIAL IMPRESSION / ASSESSMENT AND PLAN / ED COURSE  Pertinent labs & imaging results that were available during my care of the patient were reviewed by me and considered in my medical decision making (see chart for details).   Patient presents emergency department for 3 weeks of significant nasal discharge and congestion now with cough with occasional sputum production.  Patient has clear lung sounds on exam.  Patient does have moderate rhinorrhea.  Given the patient's chronicity of symptoms we will prescribe an antibiotic such as Augmentin that would treat sinusitis.  Discussed with the patient using Afrin twice daily for the next 3 days and then using Flonase for the next 2 weeks.  Patient will follow up with his doctor.  Given the patient's  symptoms we will also test for COVID however the patient would like to leave before his result is back  Luisfelipe Engelstad. was evaluated in Emergency Department on 10/29/2020 for the symptoms described in the history of present illness. He was evaluated in the context of the global COVID-19 pandemic, which necessitated consideration that the patient might be at risk for infection with the SARS-CoV-2 virus that causes COVID-19. Institutional protocols and algorithms that pertain to the evaluation of patients at risk for COVID-19 are in a state of rapid change based on information released by regulatory bodies including the CDC and federal and state organizations. These policies and algorithms were followed during the patient's care in the ED.  ____________________________________________   FINAL CLINICAL IMPRESSION(S) / ED DIAGNOSES  Sinusitis Rhinorrhea   Harvest Dark, MD 10/29/20 [phone]

## 2020-10-29 NOTE — ED Notes (Signed)
Delay with obtaining covid swab due to it being out of stock in the ED...waiting on lab to send more to unit.

## 2020-10-29 NOTE — ED Notes (Signed)
EDP at bedside  

## 2020-10-31 ENCOUNTER — Other Ambulatory Visit: Payer: Self-pay

## 2020-10-31 MED ORDER — ALLOPURINOL 300 MG PO TABS: 300.0000 mg | ORAL_TABLET | Freq: Every day | ORAL | 0 refills | Status: DC

## 2020-11-01 ENCOUNTER — Ambulatory Visit: Payer: Medicare HMO | Admitting: Internal Medicine

## 2020-11-07 ENCOUNTER — Telehealth: Payer: Self-pay | Admitting: Lab

## 2020-11-07 NOTE — Progress Notes (Signed)
  Chronic Care Management   Outreach Note  11/07/2020 Name: Allen Hunter. MRN: [id number] DOB: 01/17/1941  Referred by: [name], MD Reason for referral : Medication Management   An unsuccessful telephone outreach was attempted today. The patient was referred to the pharmacist for assistance with care management and care coordination.   Follow Up Plan:   [place]

## 2020-11-14 ENCOUNTER — Encounter: Payer: Self-pay | Admitting: Internal Medicine

## 2020-11-14 ENCOUNTER — Ambulatory Visit (INDEPENDENT_AMBULATORY_CARE_PROVIDER_SITE_OTHER): Payer: Medicare HMO | Admitting: Internal Medicine

## 2020-11-14 ENCOUNTER — Other Ambulatory Visit: Payer: Self-pay | Admitting: Internal Medicine

## 2020-11-14 ENCOUNTER — Other Ambulatory Visit: Payer: Self-pay

## 2020-11-14 VITALS — BP 122/82 | HR 63 | Temp 97.7°F | Ht 65.0 in | Wt 177.0 lb

## 2020-11-14 DIAGNOSIS — N3281 Overactive bladder: Secondary | ICD-10-CM

## 2020-11-14 DIAGNOSIS — Z0001 Encounter for general adult medical examination with abnormal findings: Secondary | ICD-10-CM

## 2020-11-14 DIAGNOSIS — E559 Vitamin D deficiency, unspecified: Secondary | ICD-10-CM

## 2020-11-14 DIAGNOSIS — E119 Type 2 diabetes mellitus without complications: Secondary | ICD-10-CM | POA: Diagnosis not present

## 2020-11-14 DIAGNOSIS — J309 Allergic rhinitis, unspecified: Secondary | ICD-10-CM | POA: Diagnosis not present

## 2020-11-14 DIAGNOSIS — E538 Deficiency of other specified B group vitamins: Secondary | ICD-10-CM | POA: Diagnosis not present

## 2020-11-14 DIAGNOSIS — J45901 Unspecified asthma with (acute) exacerbation: Secondary | ICD-10-CM

## 2020-11-14 LAB — URINALYSIS, ROUTINE W REFLEX MICROSCOPIC
Bilirubin Urine: NEGATIVE
Hgb urine dipstick: NEGATIVE
Leukocytes,Ua: NEGATIVE
Nitrite: NEGATIVE
RBC / HPF: NONE SEEN (ref 0–?)
Specific Gravity, Urine: 1.025 (ref 1.000–1.030)
Total Protein, Urine: NEGATIVE
Urine Glucose: >=1000 — AB
Urobilinogen, UA: 0.2 (ref 0.0–1.0)
pH: 5.5 (ref 5.0–8.0)

## 2020-11-14 LAB — HEMOGLOBIN A1C: Hgb A1c MFr Bld: 8.5 % — ABNORMAL HIGH (ref 4.6–6.5)

## 2020-11-14 LAB — CBC WITH DIFFERENTIAL/PLATELET
Basophils Absolute: 0 10*3/uL (ref 0.0–0.1)
Basophils Relative: 1 % (ref 0.0–3.0)
Eosinophils Absolute: 0.1 10*3/uL (ref 0.0–0.7)
Eosinophils Relative: 3.1 % (ref 0.0–5.0)
HCT: 44 % (ref 39.0–52.0)
Hemoglobin: 13.9 g/dL (ref 13.0–17.0)
Lymphocytes Relative: 31.9 % (ref 12.0–46.0)
Lymphs Abs: 1.2 10*3/uL (ref 0.7–4.0)
MCHC: 31.7 g/dL (ref 30.0–36.0)
MCV: 92 fl (ref 78.0–100.0)
Monocytes Absolute: 0.4 10*3/uL (ref 0.1–1.0)
Monocytes Relative: 11.4 % (ref 3.0–12.0)
Neutro Abs: 2 10*3/uL (ref 1.4–7.7)
Neutrophils Relative %: 52.6 % (ref 43.0–77.0)
Platelets: 241 10*3/uL (ref 150.0–400.0)
RBC: 4.78 Mil/uL (ref 4.22–5.81)
RDW: 16.3 % — ABNORMAL HIGH (ref 11.5–15.5)
WBC: 3.9 10*3/uL — ABNORMAL LOW (ref 4.0–10.5)

## 2020-11-14 LAB — BASIC METABOLIC PANEL
BUN: 20 mg/dL (ref 6–23)
CO2: 28 mEq/L (ref 19–32)
Calcium: 10.4 mg/dL (ref 8.4–10.5)
Chloride: 107 mEq/L (ref 96–112)
Creatinine, Ser: 1.28 mg/dL (ref 0.40–1.50)
GFR: 52.28 mL/min — ABNORMAL LOW (ref >60.00)
Glucose, Bld: 102 mg/dL — ABNORMAL HIGH (ref 70–99)
Potassium: 4.7 mEq/L (ref 3.5–5.1)
Sodium: 144 mEq/L (ref 135–145)

## 2020-11-14 LAB — HEPATIC FUNCTION PANEL
ALT: 17 U/L (ref 0–53)
AST: 19 U/L (ref 0–37)
Albumin: 4.2 g/dL (ref 3.5–5.2)
Alkaline Phosphatase: 45 U/L (ref 39–117)
Bilirubin, Direct: 0.1 mg/dL (ref 0.0–0.3)
Total Bilirubin: 0.4 mg/dL (ref 0.2–1.2)
Total Protein: 7.1 g/dL (ref 6.0–8.3)

## 2020-11-14 LAB — LIPID PANEL
Cholesterol: 172 mg/dL (ref 0–200)
HDL: 57.3 mg/dL (ref >39.00)
LDL Cholesterol: 100 mg/dL — ABNORMAL HIGH (ref 0–99)
NonHDL: 114.48
Total CHOL/HDL Ratio: 3
Triglycerides: 72 mg/dL (ref 0.0–149.0)
VLDL: 14.4 mg/dL (ref 0.0–40.0)

## 2020-11-14 LAB — VITAMIN B12: Vitamin B-12: >1550 pg/mL — ABNORMAL HIGH (ref 211–911)

## 2020-11-14 LAB — MICROALBUMIN / CREATININE URINE RATIO
Creatinine,U: 131.2 mg/dL
Microalb Creat Ratio: 3.6 mg/g (ref 0.0–30.0)
Microalb, Ur: 4.7 mg/dL — ABNORMAL HIGH (ref 0.0–1.9)

## 2020-11-14 LAB — VITAMIN D 25 HYDROXY (VIT D DEFICIENCY, FRACTURES): VITD: 72.31 ng/mL (ref 30.00–100.00)

## 2020-11-14 LAB — TSH: TSH: 1.66 u[IU]/mL (ref 0.35–5.50)

## 2020-11-14 MED ORDER — BISOPROLOL FUMARATE 5 MG PO TABS: 5.0000 mg | ORAL_TABLET | Freq: Every day | ORAL | 3 refills | Status: DC

## 2020-11-14 MED ORDER — METHYLPREDNISOLONE ACETATE 80 MG/ML IJ SUSP
80.0000 mg | Freq: Once | INTRAMUSCULAR | Status: AC
  Administered 2020-11-14: 80 mg via INTRAMUSCULAR

## 2020-11-14 MED ORDER — INSULIN GLARGINE 100 UNIT/ML SOLOSTAR PEN: 20.0000 [IU] | PEN_INJECTOR | Freq: Every day | SUBCUTANEOUS | 11 refills | Status: DC

## 2020-11-14 MED ORDER — ALLOPURINOL 300 MG PO TABS: 300.0000 mg | ORAL_TABLET | Freq: Every day | ORAL | 3 refills | Status: AC

## 2020-11-14 MED ORDER — EMPAGLIFLOZIN 25 MG PO TABS: 25.0000 mg | ORAL_TABLET | Freq: Every day | ORAL | 3 refills | Status: DC

## 2020-11-14 MED ORDER — INSULIN GLARGINE 100 UNIT/ML SOLOSTAR PEN: 30.0000 [IU] | PEN_INJECTOR | Freq: Every day | SUBCUTANEOUS | 3 refills | Status: DC

## 2020-11-14 MED ORDER — SOLIFENACIN SUCCINATE 5 MG PO TABS: 5.0000 mg | ORAL_TABLET | Freq: Every day | ORAL | 3 refills | Status: AC

## 2020-11-14 MED ORDER — METFORMIN HCL 1000 MG PO TABS: 1000.0000 mg | ORAL_TABLET | Freq: Two times a day (BID) | ORAL | 3 refills | Status: DC

## 2020-11-14 MED ORDER — ESOMEPRAZOLE MAGNESIUM 40 MG PO CPDR: 40.0000 mg | DELAYED_RELEASE_CAPSULE | Freq: Every day | ORAL | 3 refills | Status: AC

## 2020-11-14 MED ORDER — TRELEGY ELLIPTA 100-62.5-25 MCG/INH IN AEPB: 1.0000 | INHALATION_SPRAY | Freq: Every day | RESPIRATORY_TRACT | 3 refills | Status: DC

## 2020-11-14 MED ORDER — ATORVASTATIN CALCIUM 80 MG PO TABS: 80.0000 mg | ORAL_TABLET | Freq: Every day | ORAL | 3 refills | Status: AC

## 2020-11-14 MED ORDER — TAMSULOSIN HCL 0.4 MG PO CAPS: 0.4000 mg | ORAL_CAPSULE | Freq: Every day | ORAL | 3 refills | Status: AC

## 2020-11-14 MED ORDER — MONTELUKAST SODIUM 10 MG PO TABS: 10.0000 mg | ORAL_TABLET | Freq: Every day | ORAL | 3 refills | Status: AC

## 2020-11-14 MED ORDER — DONEPEZIL HCL 10 MG PO TABS: ORAL_TABLET | ORAL | 3 refills | Status: DC

## 2020-11-14 NOTE — Progress Notes (Signed)
Patient ID: Allen Dakin., male   DOB: 26-Nov-1940, 80 y.o.   MRN: [id number]         Chief Complaint:: wellness exam and asthma flare, allergies, dm and OAB       HPI:  Allen Wegmann. is a 80 y.o. male here for wellness exam; declines flu shot, shingrix, o/w up to date with preventive referrals and immunizations                        Also with 1 wk worsening non prod cough, sob/doe and mild wheezing, as well as Does have several wks ongoing nasal allergy symptoms with clearish congestion, itch and sneezing, without fever, pain, ST, swelling.   Pt denies fever, wt loss, night sweats, loss of appetite, or other constitutional symptoms   Pt denies polydipsia, polyuria, or new focal neuro s/s.  Denies urinary symptoms such as dysuria, urgency, flank pain, hematuria or n/v, fever, chills, but does have ongoing urinary frequency but myrbetrix too expensive. No other new compalints   Wt Readings from Last 3 Encounters:  11/14/20 177 lb (80.3 kg)  10/28/20 180 lb (81.6 kg)  09/05/20 176 lb (79.8 kg)   BP Readings from Last 3 Encounters:  11/14/20 122/82  10/29/20 129/75  09/05/20 118/80   Immunization History  Administered Date(s) Administered   H1N1 04/02/2008   Influenza Whole 03/09/2007, 12/27/2007, 01/21/2009   Influenza, High Dose Seasonal PF 11/18/2016   Influenza, Seasonal, Injecte, Preservative Fre 02/02/2013   Influenza,inj,Quad PF,6+ Mos 01/18/2015   Influenza-Unspecified 01/15/2016, 01/19/2018, 11/01/2018, 12/22/2019   PFIZER(Purple Top)SARS-COV-2 Vaccination 05/05/2019, 06/02/2019   Pneumococcal Conjugate-13 04/04/2013, 11/01/2018   Pneumococcal Polysaccharide-23 03/09/2007   Td 04/02/2008   Tdap 05/17/2018   Zoster, Live 03/07/2013   There are no preventive care reminders to display for this patient.     Past Medical History:  Diagnosis Date   ALLERGIC RHINITIS 10/04/2008   ASTHMA 10/04/2008   Asthma    BACK PAIN 04/18/2008   BENIGN PROSTATIC HYPERTROPHY  03/09/2007   COLONIC POLYPS, HX OF 03/09/2007   COPD 10/20/2007   DIABETES MELLITUS, TYPE II 03/09/2007   FATIGUE 10/05/2007   GERD (gastroesophageal reflux disease) 03/01/2012   GOUT 10/19/2006   HYPERLIPIDEMIA 10/19/2006   HYPERTENSION 10/19/2006   OSA (obstructive sleep apnea) 07/19/2015   Prostate cancer ([place]) 01/19/2012   SWELLING MASS OR LUMP IN HEAD AND NECK 01/09/2010   Past Surgical History:  Procedure Laterality Date   APPENDECTOMY      reports that he quit smoking about 35 years ago. His smoking use included cigarettes. He has a 48.00 pack-year smoking history. He has never used smokeless tobacco. He reports that he does not drink alcohol and does not use drugs. family history includes Cancer in an other family member; Hypertension in his mother. No Known Allergies Current Outpatient Medications on File Prior to Visit  Medication Sig Dispense Refill   aspirin 81 MG tablet Take 81 mg by mouth daily.     blood glucose meter kit and supplies Dispense based on patient and insurance preference. Use up to four times daily as directed. (FOR ICD-10 E10.9, E11.9). 1 each 0   glucose blood (FREESTYLE LITE) test strip USE 1 STRIP TWICE A DAY AS NEEDED TO CHECK SUGAR 100 each 6   Lancets MISC Use asd 1 per day 250.02 100 each 11   mirabegron ER (MYRBETRIQ) 25 MG TB24 tablet Take 1 tablet (25 mg total) by mouth  daily. 90 tablet 3   Multiple Vitamins-Minerals (CENTRUM MEN PO) Take 1 tablet by mouth daily.     Omega-3 Fatty Acids (FISH OIL) 500 MG CAPS Take by mouth daily.     No current facility-administered medications on file prior to visit.        ROS:  All others reviewed and negative.  Objective        PE:  BP 122/82 (BP Location: Left Arm, Patient Position: Sitting, Cuff Size: Normal)   Pulse 63   Temp 97.7 F (36.5 C) (Oral)   Ht '5\' 5"'  (1.651 m)   Wt 177 lb (80.3 kg)   SpO2 92%   BMI 29.45 kg/m                 Constitutional: Pt appears in NAD               HENT: Head:  NCAT.                Right Ear: External ear normal.                 Left Ear: External ear normal.                Eyes: . Pupils are equal, round, and reactive to light. Conjunctivae and EOM are normal               Nose: without d/c or deformity               Neck: Neck supple. Gross normal ROM               Cardiovascular: Normal rate and regular rhythm.                 Pulmonary/Chest: Effort normal and breath sounds without rales but with diffuse mild wheezing.                Abd:  Soft, NT, ND, + BS, no organomegaly               Neurological: Pt is alert. At baseline orientation, motor grossly intact               Skin: Skin is warm. No rashes, no other new lesions, LE edema - none               Psychiatric: Pt behavior is normal without agitation   Micro: none  Cardiac tracings I have personally interpreted today:  none  Pertinent Radiological findings (summarize): none   Lab Results  Component Value Date   WBC 3.9 (L) 11/14/2020   HGB 13.9 11/14/2020   HCT 44.0 11/14/2020   PLT 241.0 11/14/2020   GLUCOSE 102 (H) 11/14/2020   CHOL 172 11/14/2020   TRIG 72.0 11/14/2020   HDL 57.30 11/14/2020   LDLCALC 100 (H) 11/14/2020   ALT 17 11/14/2020   AST 19 11/14/2020   NA 144 11/14/2020   K 4.7 11/14/2020   CL 107 11/14/2020   CREATININE 1.28 11/14/2020   BUN 20 11/14/2020   CO2 28 11/14/2020   TSH 1.66 11/14/2020   PSA 3.70 05/10/2019   HGBA1C 8.5 (H) 11/14/2020   MICROALBUR 4.7 (H) 11/14/2020   Assessment/Plan:  Allen Ceci. is a 80 y.o. Black or African American [2] male with  has a past medical history of ALLERGIC RHINITIS (10/04/2008), ASTHMA (10/04/2008), Asthma, BACK PAIN (04/18/2008), BENIGN PROSTATIC HYPERTROPHY (03/09/2007), COLONIC POLYPS, HX OF (03/09/2007), COPD (10/20/2007), DIABETES MELLITUS, TYPE  II (03/09/2007), FATIGUE (10/05/2007), GERD (gastroesophageal reflux disease) (03/01/2012), GOUT (10/19/2006), HYPERLIPIDEMIA (10/19/2006), HYPERTENSION (10/19/2006),  OSA (obstructive sleep apnea) (07/19/2015), Prostate cancer ([place]) (01/19/2012), and SWELLING MASS OR LUMP IN HEAD AND NECK (01/09/2010).  Encounter for well adult exam with abnormal findings Age and sex appropriate education and counseling updated with regular exercise and diet Referrals for preventative services - none needed Immunizations addressed - decliens shingrix, flu shot Smoking counseling  - none needed Evidence for depression or other mood disorder - none significant Most recent labs reviewed. I have personally reviewed and have noted: 1) the patient's medical and social history 2) The patient's current medications and supplements 3) The patient's height, weight, and BMI have been recorded in the chart   OAB (overactive bladder) For ua with labs, myrbetrix too expensive, for change to generic vesicare,  to f/u any worsening symptoms or concerns   Exacerbation of asthma Mild to mod, for depomedrol im 80, predpac asd, albuterol hfa prn, and restart trelegy he does not take on regular basis  Diabetes Lab Results  Component Value Date   HGBA1C 8.5 (H) 11/14/2020   Uncontrolled, goal ldl < 7.5, pt to continue current medical treatment  - for increased lantus to 30 units   Allergic rhinitis Mild, for predpac as above, and otc allegra prn  Followup: Return in about 6 months (around 05/17/2021).  Cathlean Cower, MD 11/17/2020 6:30 PM [facility]

## 2020-11-14 NOTE — Patient Instructions (Addendum)
Please check with your insurance about whether the Dexcom or Elenor Legato machine is covered   Ok to change the [employer] for bladder to the Vesicare 5 mg  You had the steroid shot today  Please take all new medication as prescribed - the prednisone (sent to [facility])  Please continue all other medications as before, and refills have been done if requested - all [facility]  Please have the pharmacy call with any other refills you may need.  Please continue your efforts at being more active, low cholesterol diet, and weight control.  You are otherwise up to date with prevention measures today.  Please keep your appointments with your specialists as you may have planned  Please go to the LAB at the blood drawing area for the tests to be done  You will be contacted by phone if any changes need to be made immediately.  Otherwise, you will receive a letter about your results with an explanation, but please check with MyChart first.  Please remember to sign up for MyChart if you have not done so, as this will be important to you in the future with finding out test results, communicating by private email, and scheduling acute appointments online when needed.  Please make an Appointment to return in 6 months, or sooner if needed

## 2020-11-14 NOTE — Progress Notes (Signed)
Patient ID: Allen Dakin., male   DOB: [birth year], 80 y.o.   MRN: [id number]

## 2020-11-15 ENCOUNTER — Telehealth: Payer: Self-pay | Admitting: Internal Medicine

## 2020-11-15 MED ORDER — PREDNISONE 10 MG PO TABS: ORAL_TABLET | ORAL | 0 refills | Status: DC

## 2020-11-15 NOTE — Telephone Encounter (Signed)
Ok sent to [facility] on [address] in [place]  [phone]

## 2020-11-15 NOTE — Telephone Encounter (Signed)
[facility] pharmacy has called stating the pt stated he ws supposed to have an order for PREDNISONE but they do not see one, please advise

## 2020-11-17 ENCOUNTER — Encounter: Payer: Self-pay | Admitting: Internal Medicine

## 2020-11-17 DIAGNOSIS — J45901 Unspecified asthma with (acute) exacerbation: Secondary | ICD-10-CM | POA: Insufficient documentation

## 2020-11-17 DIAGNOSIS — N3281 Overactive bladder: Secondary | ICD-10-CM | POA: Insufficient documentation

## 2020-11-17 NOTE — Assessment & Plan Note (Signed)
Mild, for predpac as above, and otc allegra prn

## 2020-11-17 NOTE — Assessment & Plan Note (Signed)
Lab Results  Component Value Date   HGBA1C 8.5 (H) 11/14/2020   Uncontrolled, goal ldl < 7.5, pt to continue current medical treatment  - for increased lantus to 30 units

## 2020-11-17 NOTE — Assessment & Plan Note (Signed)
Mild to mod, for depomedrol im 80, predpac asd, albuterol hfa prn, and restart trelegy he does not take on regular basis

## 2020-11-17 NOTE — Assessment & Plan Note (Signed)
Age and sex appropriate education and counseling updated with regular exercise and diet Referrals for preventative services - none needed Immunizations addressed - decliens shingrix, flu shot Smoking counseling  - none needed Evidence for depression or other mood disorder - none significant Most recent labs reviewed. I have personally reviewed and have noted: 1) the patient's medical and social history 2) The patient's current medications and supplements 3) The patient's height, weight, and BMI have been recorded in the chart

## 2020-11-17 NOTE — Assessment & Plan Note (Signed)
For ua with labs, myrbetrix too expensive, for change to generic vesicare,  to f/u any worsening symptoms or concerns

## 2020-11-18 NOTE — Telephone Encounter (Signed)
Patient notified and verbalizes understanding.

## 2020-11-20 DIAGNOSIS — C61 Malignant neoplasm of prostate: Secondary | ICD-10-CM | POA: Diagnosis not present

## 2020-12-10 ENCOUNTER — Other Ambulatory Visit: Payer: Self-pay

## 2020-12-10 NOTE — Patient Outreach (Signed)
[facility]) Care Management  12/10/2020  Allen Hunter 05-15-1940 [id number]   Telephone Assessment    Successful outreach call placed to patient. He denies any acute issues or concerns at present and reports things have ben going well for him. He was at the [place] on yesterday to see his MD. He denies any RN CM needs or concerns at this time.    Medications Reviewed Today     Reviewed by Hayden Pedro, RN (Registered Nurse) on 12/10/20 at 1024  Med List Status: <None>   Medication Order Taking? Sig Documenting Provider Last Dose Status Informant  allopurinol (ZYLOPRIM) 300 MG tablet [id number]  Take 1 tablet (300 mg total) by mouth daily. [name], MD  Active   aspirin 81 MG tablet [id number] No Take 81 mg by mouth daily. [provider] Taking Active Self  atorvastatin (LIPITOR) 80 MG tablet [id number]  Take 1 tablet (80 mg total) by mouth daily. [name], MD  Active   bisoprolol (ZEBETA) 5 MG tablet [id number]  Take 1 tablet (5 mg total) by mouth daily. [name], MD  Active   blood glucose meter kit and supplies [id number] No Dispense based on patient and insurance preference. Use up to four times daily as directed. (FOR ICD-10 E10.9, E11.9). [name], MD Taking Active   donepezil (ARICEPT) 10 MG tablet [id number]  Take half tablet (5 mg) daily for 2 weeks, then increase to the full tablet at 10 mg daily [name], MD  Active   empagliflozin (JARDIANCE) 25 MG TABS tablet [id number]  Take 1 tablet (25 mg total) by mouth daily. [name], MD  Active   esomeprazole (NEXIUM) 40 MG capsule [id number]  Take 1 capsule (40 mg total) by mouth daily. [name], MD  Active   Fluticasone-Umeclidin-Vilant (TRELEGY ELLIPTA) 100-62.5-25 MCG/INH AEPB [id number]  Inhale 1 puff into the lungs daily. [name], MD  Active   glucose blood (FREESTYLE LITE) test strip [id number]  USE 1 STRIP TWICE A DAY AS NEEDED TO CHECK SUGAR [name], MD   Active   insulin glargine (LANTUS) 100 UNIT/ML Solostar Pen [id number]  Inject 30 Units into the skin daily. [name], MD  Active   Lancets [place] [id number] No Use asd 1 per day 250.02 [name], MD Taking Active Self  metFORMIN (GLUCOPHAGE) 1000 MG tablet [id number]  Take 1 tablet (1,000 mg total) by mouth 2 (two) times daily with a meal. [name], MD  Active   mirabegron ER (MYRBETRIQ) 25 MG TB24 tablet [id number] No Take 1 tablet (25 mg total) by mouth daily. [name], MD Taking Active   montelukast (SINGULAIR) 10 MG tablet [id number]  Take 1 tablet (10 mg total) by mouth at bedtime. [name], MD  Active   Multiple Vitamins-Minerals (CENTRUM MEN PO) [id number] No Take 1 tablet by mouth daily. [provider] Taking Active   Omega-3 Fatty Acids (FISH OIL) 500 MG CAPS [id number] No Take by mouth daily. [provider] Taking Active   predniSONE (DELTASONE) 10 MG tablet [id number]  3 tabs by mouth per day for 3 days,2tabs per day for 3 days,1tab per day for 3 days [name], MD  Active   solifenacin (VESICARE) 5 MG tablet [id number]  Take 1 tablet (5 mg total) by mouth daily. [name], MD  Active   tamsulosin [facility]) 0.4 MG CAPS capsule [id number]  Take 1 capsule (  0.4 mg total) by mouth daily. [name], MD  Active              Goals Addressed               This Visit's Progress     [facility] My Medicine (pt-stated)        Timeframe:  Long-Range Goal Priority:  High Start Date:  01/17/20                           Expected End Date:Dec 2022                   Follow Up Date Dec 2022  Barriers: Health Behaviors Knowledge    - call for medicine refill 2 or 3 days before it runs out - keep a list of all the medicines I take; vitamins and herbals too - learn to read medicine labels - use a pillbox to sort medicine   - Encouraged patient to take his Trelegy inhaler daily -Discussed placing a note on the refrigerator to remind patient to  take weekly Trulicity  Why is this important?   These steps will help you keep on track with your medicines.    Notes: Updated 05/14/20: Patient reports that he has not been taking his Trelegy inhaler. Nurse provided education how this would improve his breathing if taken daily. Nurse will send patient a pillbox to assist him with organizing his medications. The After visit summary  will have a list of the patient's medication.  Updated 06/11/20: Patient states he received the pillbox and will begin to use it. Patient reports that he is not taking Trelegy daily and he placed this inhaler with the medications he takes daily as a reminder. Verbalized that he will place a note on his refrigerator to remind him to take his weekly Trulicity.  07/17/20-Patient has not been taking Trulicityf or the past 2-3 wks per his report. He is unable to give clear explanation as to why but just states he stopped taking it on his own. Reinforced importance of medication given that his cbgs have bene running in the 200-300s. Blood sugar during this call 357-fasting.   10/09/20-RN CM unable to complete med reviewed during this call as patient had to end call early. He voices that he did take RN CM recommendation and took meds to local pharmacy for review. He voices that he is taking meds as directed.   12/10/20-[facility]. He reports he is being more adherent with taking meds as ordered. MD made some med changes but he does not have meds in home yet-awaiting med delivery.       (THN)Monitor and Manage My Blood Sugar (pt-stated)        Timeframe:  Long-Range Goal Priority:  High Start Date:  01/17/20                           Expected End Date: Dec 2022                   Follow Up Date Dec 2022  Barriers: Health Behaviors Knowledge    - check blood sugar at prescribed times - check blood sugar if I feel it is too high or too low - enter blood sugar readings  and medication or insulin into daily log - take the  blood sugar log to all doctor visits  -Encouraged patient to write his blood sugar values down in a calendar booklet explaining this would make it easier to follow his diabetic control versus going to his meter to view. -Discussed counting his carbohydrates by reading food labels.    Why is this important?   Checking your blood sugar at home helps to keep it from getting very high or very low.  Writing the results in a diary or log helps the doctor know how to care for you.  Your blood sugar log should have the time, date and the results.  Also, write down the amount of insulin or other medicine that you take.  Other information, like what you ate, exercise done and how you were feeling, will also be helpful.     Notes: Patient reports that he will start to write his blood sugar values in a log so he can look quickly to know how he is controlling his diabetes. He is motivated to start to look at food labels and  limit his carbohydrates < 60 grams per meal and <30 grams per snack, he states he wants to start to walk twice weekly for about a half of a mile, patient states he will use the diabetic plate method and will fill half of his plate with vegetables, patient wants to decrease the amount of coffee he drinks  to 1-2 cups daily and increase his water intake to the goal of 3 bottles daily.  Updated: 01/23/20  Updated 05/14/20: Patient states that his blood sugars have not been controlled recently. He verbalized understanding that recording his blood sugar in a calendar booklet would make it easier to follow his blood sugar trends. Patient states that he a has gotten away from counting his carbohydrates and will start to monitor his carbohydrate intake closer. He reports that he has decrease the amount of coffee he drinks and does drink about 5-6, 8 ounce bottles of water daily. Nurse will send patient a calendar booklet to record his blood sugars and  counting carbohydrates education.  Updated 06/11/20: Patient confirms that he received the calendar booklet and admits that he has not begun to record his blood sugar values. He states he will begin to monitor his diet closer and count his carbohydrates as well as record his blood sugar values in the calendar booklet.   07/17/20-Patient reports checking cbgs 2-3x/day. He checked cbg during this call. He admits to eating pasta yesterday and thinks that is the cause of elevated cbg this morning.  10/09/20-Patient reports cbgs have been in the 100s. He needs to replace battery on device so did not get accurate reading today.   12/10/20-Patient has not yet checked his cbg this marooning. He states cbgs have been in the 100-200s. Most recent A1C on file 8.5(Aug 2022).          Plan:  RN CM discussed with patient next outreach within the month of Dec. Patient agrees to care plan and follow up. Patient gave verbal consent and in agreement with RN CM follow up and timeframe. Patient aware that they may contact RN CM sooner for any issues or concerns. RN CM reviewed goals and plan of care with patient.  Enzo Montgomery, RN,BSN,CCM [facility] Telephonic Care Management Coordinator Direct Phone: [phone] Toll Free: [phone] Fax: [phone]

## 2020-12-11 ENCOUNTER — Encounter: Payer: Medicare HMO | Admitting: Counselor

## 2020-12-11 ENCOUNTER — Encounter: Payer: Self-pay | Admitting: Neurological Surgery

## 2020-12-11 DIAGNOSIS — Z029 Encounter for administrative examinations, unspecified: Secondary | ICD-10-CM

## 2020-12-18 ENCOUNTER — Encounter: Payer: Medicare HMO | Admitting: Counselor

## 2020-12-19 DIAGNOSIS — I129 Hypertensive chronic kidney disease with stage 1 through stage 4 chronic kidney disease, or unspecified chronic kidney disease: Secondary | ICD-10-CM | POA: Diagnosis not present

## 2020-12-19 DIAGNOSIS — Z23 Encounter for immunization: Secondary | ICD-10-CM | POA: Diagnosis not present

## 2020-12-19 DIAGNOSIS — N1831 Chronic kidney disease, stage 3a: Secondary | ICD-10-CM | POA: Diagnosis not present

## 2020-12-19 DIAGNOSIS — E1122 Type 2 diabetes mellitus with diabetic chronic kidney disease: Secondary | ICD-10-CM | POA: Diagnosis not present

## 2020-12-19 DIAGNOSIS — N2889 Other specified disorders of kidney and ureter: Secondary | ICD-10-CM | POA: Diagnosis not present

## 2020-12-19 DIAGNOSIS — N400 Enlarged prostate without lower urinary tract symptoms: Secondary | ICD-10-CM | POA: Diagnosis not present

## 2020-12-19 DIAGNOSIS — E875 Hyperkalemia: Secondary | ICD-10-CM | POA: Diagnosis not present

## 2020-12-23 NOTE — Telephone Encounter (Signed)
error 

## 2021-01-03 DIAGNOSIS — C61 Malignant neoplasm of prostate: Secondary | ICD-10-CM | POA: Diagnosis not present

## 2021-01-03 DIAGNOSIS — N401 Enlarged prostate with lower urinary tract symptoms: Secondary | ICD-10-CM | POA: Diagnosis not present

## 2021-01-03 DIAGNOSIS — R3912 Poor urinary stream: Secondary | ICD-10-CM | POA: Diagnosis not present

## 2021-01-03 DIAGNOSIS — D49512 Neoplasm of unspecified behavior of left kidney: Secondary | ICD-10-CM | POA: Diagnosis not present

## 2021-01-08 ENCOUNTER — Ambulatory Visit: Payer: Medicare HMO | Admitting: Physician Assistant

## 2021-01-20 DIAGNOSIS — E119 Type 2 diabetes mellitus without complications: Secondary | ICD-10-CM | POA: Diagnosis not present

## 2021-01-20 DIAGNOSIS — E089 Diabetes mellitus due to underlying condition without complications: Secondary | ICD-10-CM | POA: Diagnosis not present

## 2021-01-20 DIAGNOSIS — H524 Presbyopia: Secondary | ICD-10-CM | POA: Diagnosis not present

## 2021-01-20 DIAGNOSIS — H2513 Age-related nuclear cataract, bilateral: Secondary | ICD-10-CM | POA: Diagnosis not present

## 2021-01-30 DIAGNOSIS — H04123 Dry eye syndrome of bilateral lacrimal glands: Secondary | ICD-10-CM | POA: Diagnosis not present

## 2021-01-30 DIAGNOSIS — Z0100 Encounter for examination of eyes and vision without abnormal findings: Secondary | ICD-10-CM | POA: Diagnosis not present

## 2021-03-07 DIAGNOSIS — H04123 Dry eye syndrome of bilateral lacrimal glands: Secondary | ICD-10-CM | POA: Diagnosis not present

## 2021-03-07 DIAGNOSIS — H16142 Punctate keratitis, left eye: Secondary | ICD-10-CM | POA: Diagnosis not present

## 2021-03-11 ENCOUNTER — Other Ambulatory Visit: Payer: Self-pay

## 2021-03-11 NOTE — Patient Outreach (Signed)
[facility]) Care Management  03/11/2021  Allen Hunter 19-Oct-1940 [id number]   Telephone Assessment    Unsuccessful quarterly outreach attempt to patient. No answer after several rings and voicemail not set up.       Plan: RN CM will make outreach attempt to patient within the month of Feb if no return call.   Enzo Montgomery, RN,BSN,CCM [place] [facility] Telephonic Care Management Coordinator Direct Phone: [phone] Toll Free: [phone] Fax: [phone]

## 2021-04-08 ENCOUNTER — Other Ambulatory Visit: Payer: Self-pay

## 2021-04-08 ENCOUNTER — Ambulatory Visit (INDEPENDENT_AMBULATORY_CARE_PROVIDER_SITE_OTHER): Payer: Medicare HMO | Admitting: Internal Medicine

## 2021-04-08 ENCOUNTER — Encounter: Payer: Self-pay | Admitting: Internal Medicine

## 2021-04-08 VITALS — BP 94/52 | HR 70 | Ht 65.0 in | Wt 180.0 lb

## 2021-04-08 DIAGNOSIS — E538 Deficiency of other specified B group vitamins: Secondary | ICD-10-CM

## 2021-04-08 DIAGNOSIS — E7800 Pure hypercholesterolemia, unspecified: Secondary | ICD-10-CM | POA: Diagnosis not present

## 2021-04-08 DIAGNOSIS — E559 Vitamin D deficiency, unspecified: Secondary | ICD-10-CM | POA: Diagnosis not present

## 2021-04-08 DIAGNOSIS — G3184 Mild cognitive impairment, so stated: Secondary | ICD-10-CM

## 2021-04-08 DIAGNOSIS — E1165 Type 2 diabetes mellitus with hyperglycemia: Secondary | ICD-10-CM

## 2021-04-08 DIAGNOSIS — Z0001 Encounter for general adult medical examination with abnormal findings: Secondary | ICD-10-CM

## 2021-04-08 DIAGNOSIS — C61 Malignant neoplasm of prostate: Secondary | ICD-10-CM | POA: Diagnosis not present

## 2021-04-08 DIAGNOSIS — I10 Essential (primary) hypertension: Secondary | ICD-10-CM

## 2021-04-08 LAB — HEPATIC FUNCTION PANEL
ALT: 23 U/L (ref 0–53)
AST: 15 U/L (ref 0–37)
Albumin: 4.2 g/dL (ref 3.5–5.2)
Alkaline Phosphatase: 64 U/L (ref 39–117)
Bilirubin, Direct: 0.1 mg/dL (ref 0.0–0.3)
Total Bilirubin: 0.4 mg/dL (ref 0.2–1.2)
Total Protein: 6.6 g/dL (ref 6.0–8.3)

## 2021-04-08 LAB — CBC WITH DIFFERENTIAL/PLATELET
Basophils Absolute: 0 10*3/uL (ref 0.0–0.1)
Basophils Relative: 0.9 % (ref 0.0–3.0)
Eosinophils Absolute: 0.1 10*3/uL (ref 0.0–0.7)
Eosinophils Relative: 1.4 % (ref 0.0–5.0)
HCT: 46 % (ref 39.0–52.0)
Hemoglobin: 14.4 g/dL (ref 13.0–17.0)
Lymphocytes Relative: 23.1 % (ref 12.0–46.0)
Lymphs Abs: 1.2 10*3/uL (ref 0.7–4.0)
MCHC: 31.4 g/dL (ref 30.0–36.0)
MCV: 94 fl (ref 78.0–100.0)
Monocytes Absolute: 0.3 10*3/uL (ref 0.1–1.0)
Monocytes Relative: 6.8 % (ref 3.0–12.0)
Neutro Abs: 3.4 10*3/uL (ref 1.4–7.7)
Neutrophils Relative %: 67.8 % (ref 43.0–77.0)
Platelets: 227 10*3/uL (ref 150.0–400.0)
RBC: 4.89 Mil/uL (ref 4.22–5.81)
RDW: 18.1 % — ABNORMAL HIGH (ref 11.5–15.5)
WBC: 5.1 10*3/uL (ref 4.0–10.5)

## 2021-04-08 LAB — BASIC METABOLIC PANEL
BUN: 45 mg/dL — ABNORMAL HIGH (ref 6–23)
CO2: 27 mEq/L (ref 19–32)
Calcium: 9.6 mg/dL (ref 8.4–10.5)
Chloride: 100 mEq/L (ref 96–112)
Creatinine, Ser: 1.58 mg/dL — ABNORMAL HIGH (ref 0.40–1.50)
GFR: 40.49 mL/min — ABNORMAL LOW (ref >60.00)
Glucose, Bld: 194 mg/dL — ABNORMAL HIGH (ref 70–99)
Potassium: 4.6 mEq/L (ref 3.5–5.1)
Sodium: 140 mEq/L (ref 135–145)

## 2021-04-08 LAB — VITAMIN B12: Vitamin B-12: 927 pg/mL — ABNORMAL HIGH (ref 211–911)

## 2021-04-08 LAB — TSH: TSH: 2.44 u[IU]/mL (ref 0.35–5.50)

## 2021-04-08 LAB — LIPID PANEL
Cholesterol: 144 mg/dL (ref 0–200)
HDL: 36.5 mg/dL — ABNORMAL LOW (ref >39.00)
LDL Cholesterol: 73 mg/dL (ref 0–99)
NonHDL: 107.27
Total CHOL/HDL Ratio: 4
Triglycerides: 170 mg/dL — ABNORMAL HIGH (ref 0.0–149.0)
VLDL: 34 mg/dL (ref 0.0–40.0)

## 2021-04-08 LAB — PSA: PSA: 4.47 ng/mL — ABNORMAL HIGH (ref 0.10–4.00)

## 2021-04-08 LAB — VITAMIN D 25 HYDROXY (VIT D DEFICIENCY, FRACTURES): VITD: 72.47 ng/mL (ref 30.00–100.00)

## 2021-04-08 NOTE — Progress Notes (Signed)
Patient ID: Allen Hunter., male   DOB: July 10, 1940, 81 y.o.   MRN: [id number]         Chief Complaint:: wellness exam and Office Visit (DM issues; patient having dizziness)         HPI:  Allen Hunter. is a 81 y.o. male here for wellness exam; pt due for eye exam needs new referral, declines covid booster and shingrix, o/w up to date                        Also cbgs at home have been over 200s. Pt has been confused trying to monitor his own med use and states only taking 20 units lantus per day, instead of last recommended 30 units per day.  Pt denies chest pain, increased sob or doe, wheezing, orthopnea, PND, increased LE swelling, palpitations, dizziness or syncope.   Pt denies polydipsia, polyuria, or low sugars.   Pt denies fever, wt loss, night sweats, loss of appetite, or other constitutional symptoms  Denies urinary symptoms such as dysuria, frequency, urgency, flank pain, hematuria or n/v, fever, chills.  Denies worsening reflux, abd pain, dysphagia, n/v, bowel change or blood.  No other new complaints  Wt Readings from Last 3 Encounters:  04/08/21 180 lb (81.6 kg)  11/14/20 177 lb (80.3 kg)  10/28/20 180 lb (81.6 kg)   BP Readings from Last 3 Encounters:  04/08/21 (!) 94/52  11/14/20 122/82  10/29/20 129/75   Immunization History  Administered Date(s) Administered   H1N1 04/02/2008   Influenza Whole 03/09/2007, 12/27/2007, 01/21/2009   Influenza, High Dose Seasonal PF 11/18/2016   Influenza, Seasonal, Injecte, Preservative Fre 02/02/2013   Influenza,inj,Quad PF,6+ Mos 01/18/2015   Influenza-Unspecified 01/15/2016, 01/19/2018, 11/01/2018, 12/22/2019   PFIZER(Purple Top)SARS-COV-2 Vaccination 05/05/2019, 06/02/2019   Pneumococcal Conjugate-13 04/04/2013, 11/01/2018   Pneumococcal Polysaccharide-23 03/09/2007   Td 04/02/2008   Tdap 05/17/2018   Zoster, Live 03/07/2013   Health Maintenance Due  Topic Date Due   OPHTHALMOLOGY EXAM  11/23/2020      Past Medical  History:  Diagnosis Date   ALLERGIC RHINITIS 10/04/2008   ASTHMA 10/04/2008   Asthma    BACK PAIN 04/18/2008   BENIGN PROSTATIC HYPERTROPHY 03/09/2007   COLONIC POLYPS, HX OF 03/09/2007   COPD 10/20/2007   DIABETES MELLITUS, TYPE II 03/09/2007   FATIGUE 10/05/2007   GERD (gastroesophageal reflux disease) 03/01/2012   GOUT 10/19/2006   HYPERLIPIDEMIA 10/19/2006   HYPERTENSION 10/19/2006   OSA (obstructive sleep apnea) 07/19/2015   Prostate cancer ([place]) 01/19/2012   SWELLING MASS OR LUMP IN HEAD AND NECK 01/09/2010   Past Surgical History:  Procedure Laterality Date   APPENDECTOMY      reports that he quit smoking about 36 years ago. His smoking use included cigarettes. He has a 48.00 pack-year smoking history. He has never used smokeless tobacco. He reports that he does not drink alcohol and does not use drugs. family history includes Cancer in an other family member; Hypertension in his mother. No Known Allergies Current Outpatient Medications on File Prior to Visit  Medication Sig Dispense Refill   allopurinol (ZYLOPRIM) 300 MG tablet Take 1 tablet (300 mg total) by mouth daily. 90 tablet 3   aspirin 81 MG tablet Take 81 mg by mouth daily.     atorvastatin (LIPITOR) 80 MG tablet Take 1 tablet (80 mg total) by mouth daily. 90 tablet 3   bisoprolol (ZEBETA) 5 MG tablet Take 1 tablet (5  mg total) by mouth daily. 90 tablet 3   blood glucose meter kit and supplies Dispense based on patient and insurance preference. Use up to four times daily as directed. (FOR ICD-10 E10.9, E11.9). 1 each 0   empagliflozin (JARDIANCE) 25 MG TABS tablet Take 1 tablet (25 mg total) by mouth daily. 90 tablet 3   esomeprazole (NEXIUM) 40 MG capsule Take 1 capsule (40 mg total) by mouth daily. 90 capsule 3   Fluticasone-Umeclidin-Vilant (TRELEGY ELLIPTA) 100-62.5-25 MCG/INH AEPB Inhale 1 puff into the lungs daily. 3 each 3   glipiZIDE (GLUCOTROL) 5 MG tablet TAKE ONE-HALF TABLET BY MOUTH TWO TIMES A DAY FOR  DIABETES. TAKE 30 MINUTES BEFORE MEAL(S)     glucose blood (FREESTYLE LITE) test strip USE 1 STRIP TWICE A DAY AS NEEDED TO CHECK SUGAR 100 each 6   insulin glargine (LANTUS) 100 UNIT/ML Solostar Pen Inject 30 Units into the skin daily. 9 mL 3   Lancets MISC Use asd 1 per day 250.02 100 each 11   losartan (COZAAR) 25 MG tablet Take 1 tablet by mouth daily.     metFORMIN (GLUCOPHAGE) 1000 MG tablet Take 1 tablet (1,000 mg total) by mouth 2 (two) times daily with a meal. 180 tablet 3   montelukast (SINGULAIR) 10 MG tablet Take 1 tablet (10 mg total) by mouth at bedtime. 90 tablet 3   Multiple Vitamins-Minerals (CENTRUM MEN PO) Take 1 tablet by mouth daily.     Omega-3 Fatty Acids (FISH OIL) 500 MG CAPS Take by mouth daily.     solifenacin (VESICARE) 5 MG tablet Take 1 tablet (5 mg total) by mouth daily. 90 tablet 3   tamsulosin (FLOMAX) 0.4 MG CAPS capsule Take 1 capsule (0.4 mg total) by mouth daily. 90 capsule 3   donepezil (ARICEPT) 10 MG tablet Take half tablet (5 mg) daily for 2 weeks, then increase to the full tablet at 10 mg daily (Patient not taking: Reported on 04/08/2021) 90 tablet 3   No current facility-administered medications on file prior to visit.        ROS:  All others reviewed and negative.  Objective        PE:  BP (!) 94/52 (BP Location: Left Arm, Patient Position: Sitting, Cuff Size: Large)    Pulse 70    Ht 5' 5" (1.651 m)    Wt 180 lb (81.6 kg)    SpO2 96%    BMI 29.95 kg/m                 Constitutional: Pt appears in NAD               HENT: Head: NCAT.                Right Ear: External ear normal.                 Left Ear: External ear normal.                Eyes: . Pupils are equal, round, and reactive to light. Conjunctivae and EOM are normal               Nose: without d/c or deformity               Neck: Neck supple. Gross normal ROM               Cardiovascular: Normal rate and regular rhythm.  Pulmonary/Chest: Effort normal and breath sounds  without rales or wheezing.                Abd:  Soft, NT, ND, + BS, no organomegaly               Neurological: Pt is alert. At baseline orientation, motor grossly intact               Skin: Skin is warm. No rashes, no other new lesions, LE edema - none               Psychiatric: Pt behavior is normal without agitation , has some ST memory issues persisting  Micro: none  Cardiac tracings I have personally interpreted today:  none  Pertinent Radiological findings (summarize): none   Lab Results  Component Value Date   WBC 5.1 04/08/2021   HGB 14.4 04/08/2021   HCT 46.0 04/08/2021   PLT 227.0 04/08/2021   GLUCOSE 194 (H) 04/08/2021   CHOL 144 04/08/2021   TRIG 170.0 (H) 04/08/2021   HDL 36.50 (L) 04/08/2021   LDLCALC 73 04/08/2021   ALT 23 04/08/2021   AST 15 04/08/2021   NA 140 04/08/2021   K 4.6 04/08/2021   CL 100 04/08/2021   CREATININE 1.58 (H) 04/08/2021   BUN 45 (H) 04/08/2021   CO2 27 04/08/2021   TSH 2.44 04/08/2021   PSA 4.47 (H) 04/08/2021   HGBA1C 8.7 (H) 04/08/2021   MICROALBUR <0.7 04/08/2021   Assessment/Plan:  Quavis Klutz. is a 81 y.o. Black or African American [2] male with  has a past medical history of ALLERGIC RHINITIS (10/04/2008), ASTHMA (10/04/2008), Asthma, BACK PAIN (04/18/2008), BENIGN PROSTATIC HYPERTROPHY (03/09/2007), COLONIC POLYPS, HX OF (03/09/2007), COPD (10/20/2007), DIABETES MELLITUS, TYPE II (03/09/2007), FATIGUE (10/05/2007), GERD (gastroesophageal reflux disease) (03/01/2012), GOUT (10/19/2006), HYPERLIPIDEMIA (10/19/2006), HYPERTENSION (10/19/2006), OSA (obstructive sleep apnea) (07/19/2015), Prostate cancer ([place]) (01/19/2012), and SWELLING MASS OR LUMP IN HEAD AND NECK (01/09/2010).  Encounter for well adult exam with abnormal findings Age and sex appropriate education and counseling updated with regular exercise and diet Referrals for preventative services - for optho referral Dr Katy Fitch Immunizations addressed - declines covid booster and  shingrix Smoking counseling  - none needed Evidence for depression or other mood disorder - none significant Most recent labs reviewed. I have personally reviewed and have noted: 1) the patient's medical and social history 2) The patient's current medications and supplements 3) The patient's height, weight, and BMI have been recorded in the chart   Prostate cancer Has been seeing urology with last visit approx 3 mo ago per pt and stable  Diabetes Lab Results  Component Value Date   HGBA1C 8.7 (H) 04/08/2021   uncontrolled, pt to continue current medical treatment jardiance, glucotrol, metformin but also increase lantus to 30 units per day   Essential hypertension BP Readings from Last 3 Encounters:  04/08/21 (!) 94/52  11/14/20 122/82  10/29/20 129/75   Low normal for unclear reason but I suspect some med confusion, difficult to know which BP med he is currently taking,, pt to continue medical treatment losartan, zebeta for now and check lab   Hyperlipidemia Lab Results  Component Value Date   [place] 73 04/08/2021   Unocontrolled, goal ldl < 70, pt to continue current statin lipitor 80 as declines change, and for lower chol DM diet   Mild cognitive impairment With mild worsening recenlty, d/w pt - declines further eval or tx and trying to manage on  his own,  to f/u any worsening symptoms or concerns  Followup: Return in about 6 months (around 10/06/2021).  Cathlean Cower, MD 04/13/2021 7:30 AM [facility]

## 2021-04-08 NOTE — Patient Instructions (Signed)
Please continue all other medications as before, and refills have been done if requested.  Please have the pharmacy call with any other refills you may need.  Please continue your efforts at being more active, low cholesterol diet, and weight control.  You are otherwise up to date with prevention measures today.  Please keep your appointments with your specialists as you may have planned  You will be contacted regarding the referral for: Dr Katy Fitch for your Eye exam  Please go to the LAB at the blood drawing area for the tests to be done  You will be contacted by phone if any changes need to be made immediately.  Otherwise, you will receive a letter about your results with an explanation, but please check with MyChart first.  Please remember to sign up for MyChart if you have not done so, as this will be important to you in the future with finding out test results, communicating by private email, and scheduling acute appointments online when needed.  Please make an Appointment to return in 6 months, or sooner if needed

## 2021-04-09 LAB — URINALYSIS, ROUTINE W REFLEX MICROSCOPIC
Bilirubin Urine: NEGATIVE
Hgb urine dipstick: NEGATIVE
Leukocytes,Ua: NEGATIVE
Nitrite: NEGATIVE
RBC / HPF: NONE SEEN (ref 0–?)
Specific Gravity, Urine: <=1.005 — AB (ref 1.000–1.030)
Total Protein, Urine: NEGATIVE
Urine Glucose: >=1000 — AB
Urobilinogen, UA: 0.2 (ref 0.0–1.0)
WBC, UA: NONE SEEN (ref 0–?)
pH: 5.5 (ref 5.0–8.0)

## 2021-04-09 LAB — HEMOGLOBIN A1C: Hgb A1c MFr Bld: 8.7 % — ABNORMAL HIGH (ref 4.6–6.5)

## 2021-04-09 LAB — MICROALBUMIN / CREATININE URINE RATIO
Creatinine,U: 37 mg/dL
Microalb Creat Ratio: 1.9 mg/g (ref 0.0–30.0)
Microalb, Ur: <0.7 mg/dL (ref 0.0–1.9)

## 2021-04-11 ENCOUNTER — Telehealth: Payer: Self-pay | Admitting: Internal Medicine

## 2021-04-11 NOTE — Telephone Encounter (Signed)
Ok to please ask pt to stop the losartan HCT and make ROV in 1 week to recheck the BP and the kidneys

## 2021-04-11 NOTE — Telephone Encounter (Signed)
Unable to leave voicemail to notified patient to stop Losartan HCT and to make a follow up appt for 1 week for a b/p recheck.

## 2021-04-11 NOTE — Telephone Encounter (Signed)
-----   Message from Archie Balboa, [place] sent at 04/10/2021  4:55 PM EST ----- Patient states that he has been taking Losartan-HCTZ 50-12.5mg . Patient confirmed that that is what he is taking per his medication bottles. He is not taking the Losartan 25mg .

## 2021-04-13 ENCOUNTER — Encounter: Payer: Self-pay | Admitting: Internal Medicine

## 2021-04-13 NOTE — Assessment & Plan Note (Signed)
With mild worsening recenlty, d/w pt - declines further eval or tx and trying to manage on his own,  to f/u any worsening symptoms or concerns

## 2021-04-13 NOTE — Assessment & Plan Note (Signed)
Has been seeing urology with last visit approx 3 mo ago per pt and stable

## 2021-04-13 NOTE — Assessment & Plan Note (Signed)
Lab Results  Component Value Date   HGBA1C 8.7 (H) 04/08/2021   uncontrolled, pt to continue current medical treatment jardiance, glucotrol, metformin but also increase lantus to 30 units per day

## 2021-04-13 NOTE — Assessment & Plan Note (Signed)
Age and sex appropriate education and counseling updated with regular exercise and diet Referrals for preventative services - for optho referral Dr Katy Fitch Immunizations addressed - declines covid booster and shingrix Smoking counseling  - none needed Evidence for depression or other mood disorder - none significant Most recent labs reviewed. I have personally reviewed and have noted: 1) the patient's medical and social history 2) The patient's current medications and supplements 3) The patient's height, weight, and BMI have been recorded in the chart

## 2021-04-13 NOTE — Assessment & Plan Note (Addendum)
BP Readings from Last 3 Encounters:  04/08/21 (!) 94/52  11/14/20 122/82  10/29/20 129/75   Low normal for unclear reason but I suspect some med confusion, difficult to know which BP med he is currently taking,, pt to continue medical treatment losartan, zebeta for now and check lab

## 2021-04-13 NOTE — Assessment & Plan Note (Signed)
Lab Results  Component Value Date   [place] 73 04/08/2021   Unocontrolled, goal ldl < 70, pt to continue current statin lipitor 80 as declines change, and for lower chol DM diet

## 2021-04-14 NOTE — Telephone Encounter (Signed)
Unable to reach patient or leave voice mail

## 2021-04-18 ENCOUNTER — Encounter: Payer: Self-pay | Admitting: Emergency Medicine

## 2021-04-18 ENCOUNTER — Inpatient Hospital Stay: Payer: Medicare HMO

## 2021-04-18 ENCOUNTER — Other Ambulatory Visit: Payer: Self-pay

## 2021-04-18 ENCOUNTER — Inpatient Hospital Stay
Admission: EM | Admit: 2021-04-18 | Discharge: 2021-04-21 | DRG: 637 | Disposition: A | Discharge status: Home Health | Payer: Medicare HMO | Attending: Internal Medicine | Admitting: Internal Medicine

## 2021-04-18 ENCOUNTER — Emergency Department: Payer: Medicare HMO

## 2021-04-18 DIAGNOSIS — E1110 Type 2 diabetes mellitus with ketoacidosis without coma: Principal | ICD-10-CM | POA: Diagnosis present

## 2021-04-18 DIAGNOSIS — R531 Weakness: Secondary | ICD-10-CM | POA: Diagnosis not present

## 2021-04-18 DIAGNOSIS — M1A9XX0 Chronic gout, unspecified, without tophus (tophi): Secondary | ICD-10-CM

## 2021-04-18 DIAGNOSIS — G9349 Other encephalopathy: Secondary | ICD-10-CM

## 2021-04-18 DIAGNOSIS — G3184 Mild cognitive impairment, so stated: Secondary | ICD-10-CM | POA: Diagnosis not present

## 2021-04-18 DIAGNOSIS — E1010 Type 1 diabetes mellitus with ketoacidosis without coma: Secondary | ICD-10-CM | POA: Diagnosis not present

## 2021-04-18 DIAGNOSIS — Z79899 Other long term (current) drug therapy: Secondary | ICD-10-CM

## 2021-04-18 DIAGNOSIS — Z86010 Personal history of colonic polyps: Secondary | ICD-10-CM

## 2021-04-18 DIAGNOSIS — R400 Somnolence: Secondary | ICD-10-CM | POA: Diagnosis not present

## 2021-04-18 DIAGNOSIS — Z8249 Family history of ischemic heart disease and other diseases of the circulatory system: Secondary | ICD-10-CM | POA: Diagnosis not present

## 2021-04-18 DIAGNOSIS — Z794 Long term (current) use of insulin: Secondary | ICD-10-CM

## 2021-04-18 DIAGNOSIS — N179 Acute kidney failure, unspecified: Secondary | ICD-10-CM | POA: Diagnosis not present

## 2021-04-18 DIAGNOSIS — Z7984 Long term (current) use of oral hypoglycemic drugs: Secondary | ICD-10-CM

## 2021-04-18 DIAGNOSIS — Z8546 Personal history of malignant neoplasm of prostate: Secondary | ICD-10-CM

## 2021-04-18 DIAGNOSIS — E0810 Diabetes mellitus due to underlying condition with ketoacidosis without coma: Secondary | ICD-10-CM

## 2021-04-18 DIAGNOSIS — E872 Acidosis, unspecified: Secondary | ICD-10-CM

## 2021-04-18 DIAGNOSIS — R059 Cough, unspecified: Secondary | ICD-10-CM | POA: Diagnosis not present

## 2021-04-18 DIAGNOSIS — K219 Gastro-esophageal reflux disease without esophagitis: Secondary | ICD-10-CM | POA: Diagnosis present

## 2021-04-18 DIAGNOSIS — E1165 Type 2 diabetes mellitus with hyperglycemia: Secondary | ICD-10-CM | POA: Diagnosis not present

## 2021-04-18 DIAGNOSIS — J449 Chronic obstructive pulmonary disease, unspecified: Secondary | ICD-10-CM | POA: Diagnosis not present

## 2021-04-18 DIAGNOSIS — G9341 Metabolic encephalopathy: Secondary | ICD-10-CM | POA: Diagnosis not present

## 2021-04-18 DIAGNOSIS — Z7982 Long term (current) use of aspirin: Secondary | ICD-10-CM

## 2021-04-18 DIAGNOSIS — I10 Essential (primary) hypertension: Secondary | ICD-10-CM | POA: Diagnosis present

## 2021-04-18 DIAGNOSIS — N400 Enlarged prostate without lower urinary tract symptoms: Secondary | ICD-10-CM | POA: Diagnosis present

## 2021-04-18 DIAGNOSIS — Z20822 Contact with and (suspected) exposure to covid-19: Secondary | ICD-10-CM | POA: Diagnosis present

## 2021-04-18 DIAGNOSIS — R0602 Shortness of breath: Secondary | ICD-10-CM | POA: Diagnosis not present

## 2021-04-18 DIAGNOSIS — G4733 Obstructive sleep apnea (adult) (pediatric): Secondary | ICD-10-CM | POA: Diagnosis present

## 2021-04-18 DIAGNOSIS — R739 Hyperglycemia, unspecified: Secondary | ICD-10-CM | POA: Diagnosis not present

## 2021-04-18 DIAGNOSIS — Z87891 Personal history of nicotine dependence: Secondary | ICD-10-CM

## 2021-04-18 DIAGNOSIS — R0689 Other abnormalities of breathing: Secondary | ICD-10-CM | POA: Diagnosis not present

## 2021-04-18 DIAGNOSIS — E1111 Type 2 diabetes mellitus with ketoacidosis with coma: Secondary | ICD-10-CM | POA: Diagnosis not present

## 2021-04-18 DIAGNOSIS — M109 Gout, unspecified: Secondary | ICD-10-CM | POA: Diagnosis not present

## 2021-04-18 DIAGNOSIS — E785 Hyperlipidemia, unspecified: Secondary | ICD-10-CM | POA: Diagnosis present

## 2021-04-18 DIAGNOSIS — Z7951 Long term (current) use of inhaled steroids: Secondary | ICD-10-CM

## 2021-04-18 DIAGNOSIS — R000 Tachycardia, unspecified: Secondary | ICD-10-CM | POA: Diagnosis not present

## 2021-04-18 LAB — BASIC METABOLIC PANEL
BUN: 40 mg/dL — ABNORMAL HIGH (ref 8–23)
BUN: 47 mg/dL — ABNORMAL HIGH (ref 8–23)
CO2: <7 mmol/L — ABNORMAL LOW (ref 22–32)
CO2: <7 mmol/L — ABNORMAL LOW (ref 22–32)
Calcium: 9.2 mg/dL (ref 8.9–10.3)
Calcium: 9.6 mg/dL (ref 8.9–10.3)
Chloride: 99 mmol/L (ref 98–111)
Chloride: 99 mmol/L (ref 98–111)
Creatinine, Ser: 2.42 mg/dL — ABNORMAL HIGH (ref 0.61–1.24)
Creatinine, Ser: 2.87 mg/dL — ABNORMAL HIGH (ref 0.61–1.24)
GFR, Estimated: 26 mL/min — ABNORMAL LOW (ref >60)
GFR, Estimated: 21 mL/min — ABNORMAL LOW (ref >60)
Glucose, Bld: 383 mg/dL — ABNORMAL HIGH (ref 70–99)
Glucose, Bld: 546 mg/dL (ref 70–99)
Potassium: 5.9 mmol/L — ABNORMAL HIGH (ref 3.5–5.1)
Potassium: 4.9 mmol/L (ref 3.5–5.1)
Sodium: 138 mmol/L (ref 135–145)
Sodium: 138 mmol/L (ref 135–145)

## 2021-04-18 LAB — CBC
HCT: 50.9 % (ref 39.0–52.0)
Hemoglobin: 15.1 g/dL (ref 13.0–17.0)
MCH: 29.8 pg (ref 26.0–34.0)
MCHC: 29.7 g/dL — ABNORMAL LOW (ref 30.0–36.0)
MCV: 100.6 fL — ABNORMAL HIGH (ref 80.0–100.0)
Platelets: 271 10*3/uL (ref 150–400)
RBC: 5.06 MIL/uL (ref 4.22–5.81)
RDW: 17.6 % — ABNORMAL HIGH (ref 11.5–15.5)
WBC: 10.4 10*3/uL (ref 4.0–10.5)
nRBC: 0 % (ref 0.0–0.2)

## 2021-04-18 LAB — CBG MONITORING, ED
Glucose-Capillary: 410 mg/dL — ABNORMAL HIGH (ref 70–99)
Glucose-Capillary: 454 mg/dL — ABNORMAL HIGH (ref 70–99)
Glucose-Capillary: 464 mg/dL — ABNORMAL HIGH (ref 70–99)
Glucose-Capillary: 494 mg/dL — ABNORMAL HIGH (ref 70–99)
Glucose-Capillary: 518 mg/dL (ref 70–99)

## 2021-04-18 LAB — HEPATIC FUNCTION PANEL
ALT: 23 U/L (ref 0–44)
AST: 19 U/L (ref 15–41)
Albumin: 3.8 g/dL (ref 3.5–5.0)
Alkaline Phosphatase: 71 U/L (ref 38–126)
Bilirubin, Direct: <0.1 mg/dL (ref 0.0–0.2)
Total Bilirubin: 1 mg/dL (ref 0.3–1.2)
Total Protein: 6.9 g/dL (ref 6.5–8.1)

## 2021-04-18 LAB — BLOOD GAS, VENOUS
Acid-base deficit: 28.8 mmol/L — ABNORMAL HIGH (ref 0.0–2.0)
Bicarbonate: 5.8 mmol/L — ABNORMAL LOW (ref 20.0–28.0)
O2 Saturation: 54.7 %
Patient temperature: 37
pCO2, Ven: 37 mmHg — ABNORMAL LOW (ref 44.0–60.0)
pH, Ven: <6.9 — CL (ref 7.250–7.430)
pO2, Ven: 57 mmHg — ABNORMAL HIGH (ref 32.0–45.0)

## 2021-04-18 LAB — TROPONIN I (HIGH SENSITIVITY)
Troponin I (High Sensitivity): 5 ng/L (ref <18)
Troponin I (High Sensitivity): 14 ng/L (ref <18)

## 2021-04-18 LAB — PHOSPHORUS: Phosphorus: 11.5 mg/dL — ABNORMAL HIGH (ref 2.5–4.6)

## 2021-04-18 LAB — MAGNESIUM: Magnesium: 2.9 mg/dL — ABNORMAL HIGH (ref 1.7–2.4)

## 2021-04-18 LAB — BETA-HYDROXYBUTYRIC ACID: Beta-Hydroxybutyric Acid: >8 mmol/L — ABNORMAL HIGH (ref 0.05–0.27)

## 2021-04-18 MED ORDER — HEPARIN SODIUM (PORCINE) 5000 UNIT/ML IJ SOLN
5000.0000 [IU] | Freq: Three times a day (TID) | INTRAMUSCULAR | Status: DC
  Administered 2021-04-18 – 2021-04-21 (×8): 5000 [IU] via SUBCUTANEOUS
  Filled 2021-04-18 (×8): qty 1

## 2021-04-18 MED ORDER — STERILE WATER FOR INJECTION IV SOLN
Freq: Once | INTRAVENOUS | Status: AC
  Filled 2021-04-18: qty 150

## 2021-04-18 MED ORDER — LACTATED RINGERS IV SOLN: INTRAVENOUS | Status: DC

## 2021-04-18 MED ORDER — PANTOPRAZOLE SODIUM 40 MG IV SOLR
40.0000 mg | Freq: Every day | INTRAVENOUS | Status: DC
  Administered 2021-04-18 – 2021-04-20 (×3): 40 mg via INTRAVENOUS
  Filled 2021-04-18 (×3): qty 40

## 2021-04-18 MED ORDER — POLYETHYLENE GLYCOL 3350 17 G PO PACK: 17.0000 g | PACK | Freq: Every day | ORAL | Status: DC | PRN

## 2021-04-18 MED ORDER — DEXTROSE 50 % IV SOLN: 0.0000 mL | INTRAVENOUS | Status: DC | PRN

## 2021-04-18 MED ORDER — SODIUM CHLORIDE 0.9 % IV BOLUS
20.0000 mL/kg | Freq: Once | INTRAVENOUS | Status: AC
  Administered 2021-04-18: 1542 mL via INTRAVENOUS

## 2021-04-18 MED ORDER — DEXTROSE IN LACTATED RINGERS 5 % IV SOLN: INTRAVENOUS | Status: DC

## 2021-04-18 MED ORDER — DOCUSATE SODIUM 100 MG PO CAPS: 100.0000 mg | ORAL_CAPSULE | Freq: Two times a day (BID) | ORAL | Status: DC | PRN

## 2021-04-18 MED ORDER — SODIUM BICARBONATE 8.4 % IV SOLN
100.0000 meq | Freq: Once | INTRAVENOUS | Status: AC
  Administered 2021-04-18: 100 meq via INTRAVENOUS
  Filled 2021-04-18: qty 100

## 2021-04-18 MED ORDER — INSULIN REGULAR(HUMAN) IN NACL 100-0.9 UT/100ML-% IV SOLN
INTRAVENOUS | Status: DC
  Administered 2021-04-18: 10 [IU]/h via INTRAVENOUS
  Filled 2021-04-18 (×2): qty 100

## 2021-04-18 NOTE — ED Provider Notes (Signed)
[facility] Provider Note    Event Date/Time   First MD Initiated Contact with Patient 04/18/21 2046     (approximate)   History   Shortness of Breath   HPI Allen Hunter. is a 81 y.o. male history of type 2 diabetes who presents for shortness of breath.  Patient arrives to room with GCS of 10 and respiratory rate in the high 20s obvious Kussmaul respirations.  Further history and review of systems are unable to be obtained at this time     Physical Exam   Triage Vital Signs: ED Triage Vitals  Enc Vitals Group     BP 04/18/21 1647 117/68     Pulse Rate 04/18/21 1647 (!) 119     Resp 04/18/21 1647 (!) 26     Temp 04/18/21 1647 97.8 F (36.6 C)     Temp Source 04/18/21 1647 Oral     SpO2 04/18/21 1647 97 %     Weight 04/18/21 1648 170 lb (77.1 kg)     Height 04/18/21 1648 5\' 6"  (1.676 m)     Head Circumference --      Peak Flow --      Pain Score 04/18/21 1648 0     Pain Loc --      Pain Edu? --      Excl. in [place]? --     Most recent vital signs: Vitals:   04/18/21 1647 04/18/21 2100  BP: 117/68 113/65  Pulse: (!) 119 (!) 116  Resp: (!) 26 (!) 21  Temp: 97.8 F (36.6 C)   SpO2: 97% 90%    General: Somnolent, arousable to touch CV:  Good peripheral perfusion.  Resp:  Increased effort.  Kussmaul pattern Abd:  No distention.  Other:  GCS 10 ([id number])   ED Results / Procedures / Treatments   Labs (all labs ordered are listed, but only abnormal results are displayed) Labs Reviewed  BASIC METABOLIC PANEL - Abnormal; Notable for the following components:      Result Value   Potassium 5.9 (*)    CO2 <7 (*)    Glucose, Bld 383 (*)    BUN 40 (*)    Creatinine, Ser 2.42 (*)    GFR, Estimated 26 (*)    All other components within normal limits  CBC - Abnormal; Notable for the following components:   MCV 100.6 (*)    MCHC 29.7 (*)    RDW 17.6 (*)    All other components within normal limits  BLOOD GAS, VENOUS - Abnormal; Notable  for the following components:   pH, Ven <6.900 (*)    pCO2, Ven 37 (*)    pO2, Ven 57.0 (*)    Bicarbonate 5.8 (*)    Acid-base deficit 28.8 (*)    All other components within normal limits  CBG MONITORING, ED - Abnormal; Notable for the following components:   Glucose-Capillary 494 (*)    All other components within normal limits  CBG MONITORING, ED - Abnormal; Notable for the following components:   Glucose-Capillary 518 (*)    All other components within normal limits  URINALYSIS, ROUTINE W REFLEX MICROSCOPIC  BASIC METABOLIC PANEL  BETA-HYDROXYBUTYRIC ACID  BETA-HYDROXYBUTYRIC ACID  TROPONIN I (HIGH SENSITIVITY)  TROPONIN I (HIGH SENSITIVITY)     EKG ED ECG REPORT I, Naaman Plummer, the attending physician, personally viewed and interpreted this ECG.  Date: 04/18/2021 EKG Time: 1542 Rate: 61 Rhythm: Atrially paced rhythm with prolonged AV  conduction QRS Axis: normal Intervals: Nonspecific intraventricular block ST/T Wave abnormalities: normal Narrative Interpretation: no evidence of acute ischemia   RADIOLOGY ED MD interpretation: 2 view chest x-ray shows no evidence of acute abnormalities including no pneumonia, pneumothorax, or widened mediastinum  Official radiology report(s): DG Chest 2 View  Result Date: 04/18/2021 CLINICAL DATA:  Short of breath and cough for 1 week EXAM: CHEST - 2 VIEW COMPARISON:  07/20/2020 FINDINGS: Frontal and lateral views of the chest demonstrate an unremarkable cardiac silhouette. No airspace disease, effusion, or pneumothorax. No acute bony abnormality. IMPRESSION: 1. No acute intrathoracic process. Electronically Signed   By: Randa Ngo M.D.   On: 04/18/2021 17:31      PROCEDURES:  Critical Care performed: Yes, see critical care procedure note(s)  .1-3 Lead EKG Interpretation Performed by: Naaman Plummer, MD Authorized by: Naaman Plummer, MD     Interpretation: normal     ECG rate:  115   ECG rate assessment:  tachycardic     Rhythm: sinus tachycardia     Ectopy: none     Conduction: normal    CRITICAL CARE Performed by: Naaman Plummer   Total critical care time: 37 minutes  Critical care time was exclusive of separately billable procedures and treating other patients.  Critical care was necessary to treat or prevent imminent or life-threatening deterioration.  Critical care was time spent personally by me on the following activities: development of treatment plan with patient and/or surrogate as well as nursing, discussions with consultants, evaluation of patient's response to treatment, examination of patient, obtaining history from patient or surrogate, ordering and performing treatments and interventions, ordering and review of laboratory studies, ordering and review of radiographic studies, pulse oximetry and re-evaluation of patient's condition.   MEDICATIONS ORDERED IN ED: Medications  insulin regular, human (MYXREDLIN) 100 units/ 100 mL infusion (10 Units/hr Intravenous New Bag/Given 04/18/21 2128)  lactated ringers infusion (0 mLs Intravenous Hold 04/18/21 2130)  dextrose 5 % in lactated ringers infusion (0 mLs Intravenous Hold 04/18/21 2130)  dextrose 50 % solution 0-50 mL (has no administration in time range)  sodium chloride 0.9 % bolus 1,542 mL (1,542 mLs Intravenous New Bag/Given 04/18/21 2115)     IMPRESSION / MDM / [place] / ED COURSE  I reviewed the triage vital signs and the nursing notes.                              Differential diagnosis includes, but is not limited to, ACS, PE, DKA, sepsis  The patient is on the cardiac monitor to evaluate for evidence of arrhythmia and/or significant heart rate changes.  Patients presentation most consistent with hyperglycemic state WITH evidence of DKA. Given Exam, History, and Workup I have low suspicion for an emergent precipitating factor of this hyperglycemic state such as atypical MI, acute abdomen, or other  serious bacterial illness. Patient is type II diabetic with nonadherence in medication regimen/adherence. pH: <6.9 Potassium: 5.9  Patient started on continuous fluid boluses of LR, insulin drip, potassium chloride supplemented as needed given potassium level at each BMP Reassessment: 2148 Mental/respiratory status improving Consults-spoke to [place] in the ICU who agrees to except this patient for further evaluation and management Dispo: Admit ICU        FINAL CLINICAL IMPRESSION(S) / ED DIAGNOSES   Final diagnoses:  Diabetic ketoacidosis with coma associated with type 2 diabetes mellitus ([place])     Rx /  DC Orders   ED Discharge Orders     None        Note:  This document was prepared using Dragon voice recognition software and may include unintentional dictation errors.   Naaman Plummer, MD 04/18/21 2151

## 2021-04-18 NOTE — H&P (Addendum)
NAME:  Allen Hunter., MRN:  [id number], DOB:  07-Oct-1940, LOS: 0 ADMISSION DATE:  04/18/2021, CONSULTATION DATE:  04/18/21 REFERRING MD:  Dr. Cheri Fowler, CHIEF COMPLAINT:  Shortness of Breath  History of Present Illness:  81 yo M presenting to [facility] ED on 04/18/21 from home with complaints of SOB that has been ongoing for a "couple of weeks". While waiting in the ED waiting room, it appears his confusion worsened. The patient's sister is bedside, providing history as the patient is still too confused for an in depth interview. 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 She has been concerned about his ability to manage his medications. However, states that this current mentation is very abnormal for him. He is usually alert & oriented, just at times forgetful, but independent with his ADL's.  She confirms that he does not use tobacco products and does not drink alcohol or use recreational substances.  She also confirms to the best of her knowledge that he does not wear oxygen at home.  She is also unaware of any recent complaints, i.e. fever/chills/abdominal pain/nausea/vomiting/diarrhea/chest pain/dysuria. Of note, per chart review the patient due to possible confusion was not taking his insulin as directed at home, 20 units daily instead of the recommended 30 units daily. ED course: Upon arrival patient noted to have a GCS of 10 with obvious Kussmaul respirations.   Medications given: NS bolus 1.5 mL, LR @ 125 mL/h & insulin drip started Initial Vitals: afebrile 97.2, tachypneic 26, tachycardic 119, BP- 117/68 & SpO2 97% on room air Significant labs: (Labs/ Imaging personally reviewed) I, Domingo Pulse Rust-Chester, AGACNP-BC, personally viewed and interpreted this ECG. EKG Interpretation: Date: 04/18/21, EKG Time: 16:53, Rate: 119, Rhythm: ST, QRS Axis:  possible LAD, Intervals: normal, ST/T Wave abnormalities: none, Narrative Interpretation: Sinus  Tachycardia Chemistry: Na+:138, K+: 5.9 > 4.9, BUN/Cr.: 40/2.42, Serum CO2/ AG: <7/not calc., Beta-hydroxy: > 8, glucose: 383 > 494 > 518 Hematology: WBC: 10.4, Hgb: 15.1,  Troponin: 5, PCT: pending, COVID-19 & Influenza A/B: pending VBG: < 6.9/ 37/ 57/ 5.8 CXR 04/18/21: no acute cardiopulmonary process CT head 04/18/21: pending  PCCM consulted for admission due to severe DKA with altered mental status & severe metabolic acidosis.  Pertinent  Medical History  HTN T2DM Prostate cancer HLD COPD & asthma OSA BPH  Significant Hospital Events: Including procedures, antibiotic start and stop dates in addition to other pertinent events   04/18/21: Patient admitted altered with severe DKA  and severe metabolic acidosis requiring sodium bicarbonate drip.  Interim History / Subjective:  Patient alert and responsive, but confused.  He is able to follow simple commands with repetition, moving all extremities.  His sister who is bedside feels he has improved since administration of fluid and insulin.  Objective   Blood pressure 113/65, pulse (!) 116, temperature 97.8 F (36.6 C), temperature source Oral, resp. rate (!) 21, height '5\' 6"'  (1.676 m), weight 77.1 kg, SpO2 90 %.       No intake or output data in the 24 hours ending 04/18/21 2200 Filed Weights   04/18/21 1648  Weight: 77.1 kg    Examination: General: Adult male, critically ill, lying in bed, pleasantly confused, NAD HEENT: MM pink/dry, anicteric, atraumatic, neck supple Neuro: A&O x 2, follows simple commands, PERRL +3, MAE CV: s1s2 RRR, ST on monitor, no r/m/g Pulm: Regular, Kussmaul respirations- mildly labored on room air, breath sounds clear-BUL & clear/diminished-BLL GI: soft, rounded,  non tender, bs x 4 Skin: Limited exam- no rashes/lesions noted Extremities: warm/dry, pulses + 2 R/P, no edema noted  Resolved Hospital Problem list     Assessment & Plan:  Diabetic ketoacidosis  PMHx: T2DM Suspect patient has not  been taking his insulin properly as this was just discovered at recent PCP visit.  Only complaint is shortness of breath, chest x-ray clear and no leukocytosis or fever to suggest pneumonia. pH <6.9, CO2 <7, Anion Gap not Calc, serum glucose: 383> 546  Potassium: 5.9> 4.6  - DKA protocol initiated - sodium bicarbonate infusion for 1 L, 2 A bicarb given - Aggressive IV hydration, when blood glucose falls below 250 add D5 to IV fluids - Insulin drip ordered per Endo tool protocol - Strict I/O's: alert provider if UOP < 0.5 mL/kg/hr - Q 4 BMP, closely monitor potassium, replace electrolytes PRN - monitor blood glucose every 1 hour, per Endo tool protocol - trend serum CO2/ AG/ beta-hydroxy, f/u A1C - Diabetes coordinator consult - Resume home insulin regiment once appropriate  Acute Kidney Injury  Severe metabolic acidosis Baseline Cr: 1.28-1.58, Cr on admission: 2.42 - bicarb supplementation as above - trend VBG - Strict I/O's: alert provider if UOP < 0.5 mL/kg/hr - gentle IVF hydration  - Daily BMP, replace electrolytes PRN - Avoid nephrotoxic agents as able, ensure adequate renal perfusion - consider renal US, if no improvement with IVF and bicarb  Acute Encephalopathy secondary to severe DKA & acidosis in the setting of possible mild chronic confusion? - STAT CT head ordered - falls & aspiration precautions - Q 4 neuro checks - consider MRI if mentation does not continue to improve with DKA Tx - consult to [facility] for discharge needs - continue outpatient donepezil as mentation allows  COPD without exacerbation PHMx: OSA, asthma - supplemental O2 PRN to maintain SpO2 > 90% - budesonide nebs BID, bronchodilators PRN > restart home inhalers as mentation allows - outpatient montelukast restarted  Hyperlipidemia PMHx: HTN - continue outpatient Lipitor as mentation allows - continue ASA - outpatient losartan & bisoprolol on hold as BP is stable but marginal  BPH - continue  outpatient regimen as mentation allows: darifenacin & tamsulosin  Gout without exacerbation - allopurinol on hold due to AKI, consider restarting at lower dose as patient stabilizes  Best Practice (right click and "Reselect all SmartList Selections" daily)  Diet/type: NPO w/ oral meds DVT prophylaxis: prophylactic heparin  GI prophylaxis: PPI Lines: N/A Foley:  N/A Code Status:  full code Last date of multidisciplinary goals of care discussion [04/18/21- will need to re-address with patient as mentation clears]  Labs   CBC: Recent Labs  Lab 04/18/21 1700  WBC 10.4  HGB 15.1  HCT 50.9  MCV 100.6*  PLT 060    Basic Metabolic Panel: Recent Labs  Lab 04/18/21 1700  NA 138  K 5.9*  CL 99  CO2 <7*  GLUCOSE 383*  BUN 40*  CREATININE 2.42*  CALCIUM 9.2   GFR: Estimated Creatinine Clearance: 23 mL/min (A) (by C-G formula based on SCr of 2.42 mg/dL (H)). Recent Labs  Lab 04/18/21 1700  WBC 10.4    Liver Function Tests: No results for input(s): AST, ALT, ALKPHOS, BILITOT, PROT, ALBUMIN in the last 168 hours. No results for input(s): LIPASE, AMYLASE in the last 168 hours. No results for input(s): AMMONIA in the last 168 hours.  ABG    Component Value Date/Time   HCO3 5.8 (L) 04/18/2021 2059   ACIDBASEDEF 28.8 (  H) 04/18/2021 2059   O2SAT 54.7 04/18/2021 2059     Coagulation Profile: No results for input(s): INR, PROTIME in the last 168 hours.  Cardiac Enzymes: No results for input(s): CKTOTAL, CKMB, CKMBINDEX, TROPONINI in the last 168 hours.  HbA1C: Hgb A1c MFr Bld  Date/Time Value Ref Range Status  04/08/2021 02:59 PM 8.7 (H) 4.6 - 6.5 % Final    Comment:    Glycemic Control Guidelines for People with Diabetes:Non Diabetic:  <6%Goal of Therapy: <7%Additional Action Suggested:  >8%   11/14/2020 10:32 AM 8.5 (H) 4.6 - 6.5 % Final    Comment:    Glycemic Control Guidelines for People with Diabetes:Non Diabetic:  <6%Goal of Therapy: <7%Additional Action  Suggested:  >8%     CBG: Recent Labs  Lab 04/18/21 2043 04/18/21 2126  GLUCAP 494* 518*    Review of Systems:   Patient too altered for in depth interview, however DID admit to shortness of breath.  Past Medical History:  He,  has a past medical history of ALLERGIC RHINITIS (10/04/2008), ASTHMA (10/04/2008), Asthma, BACK PAIN (04/18/2008), BENIGN PROSTATIC HYPERTROPHY (03/09/2007), COLONIC POLYPS, HX OF (03/09/2007), COPD (10/20/2007), DIABETES MELLITUS, TYPE II (03/09/2007), FATIGUE (10/05/2007), GERD (gastroesophageal reflux disease) (03/01/2012), GOUT (10/19/2006), HYPERLIPIDEMIA (10/19/2006), HYPERTENSION (10/19/2006), OSA (obstructive sleep apnea) (07/19/2015), Prostate cancer ([place]) (01/19/2012), and SWELLING MASS OR LUMP IN HEAD AND NECK (01/09/2010).   Surgical History:   Past Surgical History:  Procedure Laterality Date   APPENDECTOMY       Social History:   reports that he quit smoking about 36 years ago. His smoking use included cigarettes. He has a 48.00 pack-year smoking history. He has never used smokeless tobacco. He reports that he does not drink alcohol and does not use drugs.   Family History:  His family history includes Cancer in an other family member; Hypertension in his mother.   Allergies No Known Allergies   Home Medications  Prior to Admission medications   Medication Sig Start Date End Date Taking? Authorizing Provider  allopurinol (ZYLOPRIM) 300 MG tablet Take 1 tablet (300 mg total) by mouth daily. 11/14/20  Yes [name], MD  aspirin 81 MG tablet Take 81 mg by mouth daily.   Yes [provider]  atorvastatin (LIPITOR) 80 MG tablet Take 1 tablet (80 mg total) by mouth daily. 11/14/20  Yes [name], MD  bisoprolol (ZEBETA) 5 MG tablet Take 1 tablet (5 mg total) by mouth daily. 11/14/20  Yes [name], MD  blood glucose meter kit and supplies Dispense based on patient and insurance preference. Use up to four times daily as directed. (FOR  ICD-10 E10.9, E11.9). 07/23/20  Yes [name], MD  donepezil (ARICEPT) 10 MG tablet Take half tablet (5 mg) daily for 2 weeks, then increase to the full tablet at 10 mg daily 11/14/20  Yes [name], MD  empagliflozin (JARDIANCE) 25 MG TABS tablet Take 1 tablet (25 mg total) by mouth daily. 11/14/20  Yes [name], MD  esomeprazole (NEXIUM) 40 MG capsule Take 1 capsule (40 mg total) by mouth daily. 11/14/20  Yes [name], MD  Fluticasone-Umeclidin-Vilant (TRELEGY ELLIPTA) 100-62.5-25 MCG/INH AEPB Inhale 1 puff into the lungs daily. 11/14/20  Yes [name], MD  glipiZIDE (GLUCOTROL) 5 MG tablet TAKE ONE-HALF TABLET BY MOUTH TWO TIMES A DAY FOR DIABETES. TAKE 30 MINUTES BEFORE MEAL(S) 03/18/21  Yes [provider]  glucose blood (FREESTYLE LITE) test strip USE 1 STRIP TWICE A  DAY AS NEEDED TO CHECK SUGAR 10/01/20  Yes [name], MD  insulin glargine (LANTUS) 100 UNIT/ML Solostar Pen Inject 30 Units into the skin daily. 11/14/20 04/18/21 Yes [name], MD  Lancets MISC Use asd 1 per day 250.02 10/26/13  Yes [name], MD  losartan (COZAAR) 25 MG tablet Take 1 tablet by mouth daily. 12/09/20  Yes [provider]  metFORMIN (GLUCOPHAGE) 1000 MG tablet Take 1 tablet (1,000 mg total) by mouth 2 (two) times daily with a meal. 11/14/20 04/18/21 Yes [name], MD  montelukast (SINGULAIR) 10 MG tablet Take 1 tablet (10 mg total) by mouth at bedtime. 11/14/20  Yes [name], MD  Multiple Vitamins-Minerals (CENTRUM MEN PO) Take 1 tablet by mouth daily.   Yes [provider]  Omega-3 Fatty Acids (FISH OIL) 500 MG CAPS Take 1 capsule by mouth daily.   Yes [provider]  solifenacin (VESICARE) 5 MG tablet Take 1 tablet (5 mg total) by mouth daily. 11/14/20  Yes [name], MD  tamsulosin (FLOMAX) 0.4 MG CAPS capsule Take 1 capsule (0.4 mg total) by mouth daily. 11/14/20  Yes [name], MD     Critical care time: 60 minutes       Domingo Pulse  Rust-Chester, AGACNP-BC Acute Care Nurse Practitioner [facility]   [phone] / [phone] Please see Amion for pager details.

## 2021-04-18 NOTE — ED Triage Notes (Signed)
Pt via EMS from home. Pt c/o SOB and cough. Denies hx of asthma, CHF, or COPD. Denies pain. Pt has been SOB for last week. Per family, the [place] called him and told family that he was altered and not making much sense.   Pt on arrival is alert and oriented but lethargic on arrival saying he is tired.

## 2021-04-18 NOTE — ED Notes (Signed)
Placed on 2L [place] for comfort

## 2021-04-18 NOTE — ED Triage Notes (Signed)
Pt comes into the ED via [facility] from home c/o [facility].  EMS was called by the [facility].  [facility] but the patient wasn't able to get to the door,  Pt explained he had [facility] that has been ongoing for a couple weeks.  H/o HTn, DM, and prostate cancer.    98% RA, placed on 2L for comfort 338 CBG 20g L forearm 124/96 116 HR

## 2021-04-19 DIAGNOSIS — E1111 Type 2 diabetes mellitus with ketoacidosis with coma: Secondary | ICD-10-CM | POA: Diagnosis not present

## 2021-04-19 DIAGNOSIS — E0810 Diabetes mellitus due to underlying condition with ketoacidosis without coma: Secondary | ICD-10-CM | POA: Diagnosis not present

## 2021-04-19 DIAGNOSIS — G3184 Mild cognitive impairment, so stated: Secondary | ICD-10-CM

## 2021-04-19 DIAGNOSIS — G9341 Metabolic encephalopathy: Secondary | ICD-10-CM

## 2021-04-19 LAB — URINALYSIS, ROUTINE W REFLEX MICROSCOPIC
Glucose, UA: >1000 mg/dL — AB
Ketones, ur: >160 mg/dL — AB
Leukocytes,Ua: NEGATIVE
Nitrite: NEGATIVE
Protein, ur: 100 mg/dL — AB
Specific Gravity, Urine: 1.02 (ref 1.005–1.030)
pH: 5 (ref 5.0–8.0)

## 2021-04-19 LAB — CBC
HCT: 39.5 % (ref 39.0–52.0)
Hemoglobin: 12.5 g/dL — ABNORMAL LOW (ref 13.0–17.0)
MCH: 30.3 pg (ref 26.0–34.0)
MCHC: 31.6 g/dL (ref 30.0–36.0)
MCV: 95.6 fL (ref 80.0–100.0)
Platelets: 179 10*3/uL (ref 150–400)
RBC: 4.13 MIL/uL — ABNORMAL LOW (ref 4.22–5.81)
RDW: 16.7 % — ABNORMAL HIGH (ref 11.5–15.5)
WBC: 9.6 10*3/uL (ref 4.0–10.5)
nRBC: 0 % (ref 0.0–0.2)

## 2021-04-19 LAB — BLOOD GAS, VENOUS
Acid-Base Excess: 0.8 mmol/L (ref 0.0–2.0)
Acid-base deficit: 15.3 mmol/L — ABNORMAL HIGH (ref 0.0–2.0)
Bicarbonate: 11.9 mmol/L — ABNORMAL LOW (ref 20.0–28.0)
Bicarbonate: 26.6 mmol/L (ref 20.0–28.0)
O2 Saturation: 98.2 %
O2 Saturation: 63.3 %
Patient temperature: 37
Patient temperature: 37
pCO2, Ven: 32 mmHg — ABNORMAL LOW (ref 44.0–60.0)
pCO2, Ven: 46 mmHg (ref 44.0–60.0)
pH, Ven: 7.18 — CL (ref 7.250–7.430)
pH, Ven: 7.37 (ref 7.250–7.430)
pO2, Ven: 133 mmHg — ABNORMAL HIGH (ref 32.0–45.0)
pO2, Ven: 34 mmHg (ref 32.0–45.0)

## 2021-04-19 LAB — PHOSPHORUS
Phosphorus: 2.4 mg/dL — ABNORMAL LOW (ref 2.5–4.6)
Phosphorus: 2.8 mg/dL (ref 2.5–4.6)

## 2021-04-19 LAB — CBG MONITORING, ED
Glucose-Capillary: 112 mg/dL — ABNORMAL HIGH (ref 70–99)
Glucose-Capillary: 118 mg/dL — ABNORMAL HIGH (ref 70–99)
Glucose-Capillary: 120 mg/dL — ABNORMAL HIGH (ref 70–99)
Glucose-Capillary: 140 mg/dL — ABNORMAL HIGH (ref 70–99)
Glucose-Capillary: 159 mg/dL — ABNORMAL HIGH (ref 70–99)
Glucose-Capillary: 173 mg/dL — ABNORMAL HIGH (ref 70–99)
Glucose-Capillary: 176 mg/dL — ABNORMAL HIGH (ref 70–99)
Glucose-Capillary: 187 mg/dL — ABNORMAL HIGH (ref 70–99)
Glucose-Capillary: 197 mg/dL — ABNORMAL HIGH (ref 70–99)
Glucose-Capillary: 207 mg/dL — ABNORMAL HIGH (ref 70–99)
Glucose-Capillary: 223 mg/dL — ABNORMAL HIGH (ref 70–99)
Glucose-Capillary: 227 mg/dL — ABNORMAL HIGH (ref 70–99)
Glucose-Capillary: 238 mg/dL — ABNORMAL HIGH (ref 70–99)
Glucose-Capillary: 339 mg/dL — ABNORMAL HIGH (ref 70–99)
Glucose-Capillary: 372 mg/dL — ABNORMAL HIGH (ref 70–99)

## 2021-04-19 LAB — BASIC METABOLIC PANEL
Anion gap: 25 — ABNORMAL HIGH (ref 5–15)
Anion gap: 15 (ref 5–15)
Anion gap: 18 — ABNORMAL HIGH (ref 5–15)
Anion gap: 13 (ref 5–15)
BUN: 43 mg/dL — ABNORMAL HIGH (ref 8–23)
BUN: 40 mg/dL — ABNORMAL HIGH (ref 8–23)
BUN: 42 mg/dL — ABNORMAL HIGH (ref 8–23)
BUN: 35 mg/dL — ABNORMAL HIGH (ref 8–23)
CO2: 18 mmol/L — ABNORMAL LOW (ref 22–32)
CO2: 19 mmol/L — ABNORMAL LOW (ref 22–32)
CO2: 21 mmol/L — ABNORMAL LOW (ref 22–32)
CO2: 19 mmol/L — ABNORMAL LOW (ref 22–32)
Calcium: 7.9 mg/dL — ABNORMAL LOW (ref 8.9–10.3)
Calcium: 8.5 mg/dL — ABNORMAL LOW (ref 8.9–10.3)
Calcium: 8.6 mg/dL — ABNORMAL LOW (ref 8.9–10.3)
Calcium: 7.6 mg/dL — ABNORMAL LOW (ref 8.9–10.3)
Chloride: 99 mmol/L (ref 98–111)
Chloride: 109 mmol/L (ref 98–111)
Chloride: 104 mmol/L (ref 98–111)
Chloride: 111 mmol/L (ref 98–111)
Creatinine, Ser: 2.52 mg/dL — ABNORMAL HIGH (ref 0.61–1.24)
Creatinine, Ser: 1.91 mg/dL — ABNORMAL HIGH (ref 0.61–1.24)
Creatinine, Ser: 2.1 mg/dL — ABNORMAL HIGH (ref 0.61–1.24)
Creatinine, Ser: 1.59 mg/dL — ABNORMAL HIGH (ref 0.61–1.24)
GFR, Estimated: 25 mL/min — ABNORMAL LOW (ref >60)
GFR, Estimated: 35 mL/min — ABNORMAL LOW (ref >60)
GFR, Estimated: 31 mL/min — ABNORMAL LOW (ref >60)
GFR, Estimated: 43 mL/min — ABNORMAL LOW (ref >60)
Glucose, Bld: 277 mg/dL — ABNORMAL HIGH (ref 70–99)
Glucose, Bld: 157 mg/dL — ABNORMAL HIGH (ref 70–99)
Glucose, Bld: 210 mg/dL — ABNORMAL HIGH (ref 70–99)
Glucose, Bld: 187 mg/dL — ABNORMAL HIGH (ref 70–99)
Potassium: 5.9 mmol/L — ABNORMAL HIGH (ref 3.5–5.1)
Potassium: 3.7 mmol/L (ref 3.5–5.1)
Potassium: 4.2 mmol/L (ref 3.5–5.1)
Potassium: 5.1 mmol/L (ref 3.5–5.1)
Sodium: 142 mmol/L (ref 135–145)
Sodium: 143 mmol/L (ref 135–145)
Sodium: 143 mmol/L (ref 135–145)
Sodium: 143 mmol/L (ref 135–145)

## 2021-04-19 LAB — RESP PANEL BY RT-PCR (FLU A&B, COVID) ARPGX2
Influenza A by PCR: NEGATIVE
Influenza B by PCR: NEGATIVE
SARS Coronavirus 2 by RT PCR: NEGATIVE

## 2021-04-19 LAB — PROCALCITONIN
Procalcitonin: 0.23 ng/mL
Procalcitonin: 0.86 ng/mL

## 2021-04-19 LAB — BETA-HYDROXYBUTYRIC ACID
Beta-Hydroxybutyric Acid: 2.63 mmol/L — ABNORMAL HIGH (ref 0.05–0.27)
Beta-Hydroxybutyric Acid: 3.16 mmol/L — ABNORMAL HIGH (ref 0.05–0.27)

## 2021-04-19 LAB — MAGNESIUM: Magnesium: 2.1 mg/dL (ref 1.7–2.4)

## 2021-04-19 MED ORDER — INSULIN GLARGINE-YFGN 100 UNIT/ML ~~LOC~~ SOLN
20.0000 [IU] | Freq: Every day | SUBCUTANEOUS | Status: DC
  Administered 2021-04-19 – 2021-04-21 (×3): 20 [IU] via SUBCUTANEOUS
  Filled 2021-04-19 (×4): qty 0.2

## 2021-04-19 MED ORDER — INSULIN ASPART 100 UNIT/ML IV SOLN
10.0000 [IU] | Freq: Once | INTRAVENOUS | Status: AC
  Administered 2021-04-19: 10 [IU] via INTRAVENOUS
  Filled 2021-04-19: qty 0.1

## 2021-04-19 MED ORDER — ATORVASTATIN CALCIUM 20 MG PO TABS
80.0000 mg | ORAL_TABLET | Freq: Every day | ORAL | Status: DC
  Administered 2021-04-19 – 2021-04-21 (×3): 80 mg via ORAL
  Filled 2021-04-19 (×3): qty 4

## 2021-04-19 MED ORDER — BUDESONIDE 0.25 MG/2ML IN SUSP
0.2500 mg | Freq: Two times a day (BID) | RESPIRATORY_TRACT | Status: DC
  Administered 2021-04-19 – 2021-04-21 (×5): 0.25 mg via RESPIRATORY_TRACT
  Filled 2021-04-19 (×5): qty 2

## 2021-04-19 MED ORDER — SODIUM CHLORIDE 0.9 % IV BOLUS
500.0000 mL | Freq: Once | INTRAVENOUS | Status: AC
  Administered 2021-04-19: 500 mL via INTRAVENOUS

## 2021-04-19 MED ORDER — INSULIN ASPART 100 UNIT/ML IJ SOLN
0.0000 [IU] | INTRAMUSCULAR | Status: DC
  Administered 2021-04-19: 2 [IU] via SUBCUTANEOUS
  Administered 2021-04-20: 4 [IU] via SUBCUTANEOUS
  Administered 2021-04-20: 2 [IU] via SUBCUTANEOUS
  Filled 2021-04-19 (×3): qty 1

## 2021-04-19 MED ORDER — MONTELUKAST SODIUM 10 MG PO TABS
10.0000 mg | ORAL_TABLET | Freq: Every day | ORAL | Status: DC
  Administered 2021-04-19 – 2021-04-20 (×2): 10 mg via ORAL
  Filled 2021-04-19 (×2): qty 1

## 2021-04-19 MED ORDER — ASPIRIN EC 81 MG PO TBEC
81.0000 mg | DELAYED_RELEASE_TABLET | Freq: Every day | ORAL | Status: DC
  Administered 2021-04-19 – 2021-04-21 (×3): 81 mg via ORAL
  Filled 2021-04-19 (×3): qty 1

## 2021-04-19 MED ORDER — DEXTROSE 50 % IV SOLN
1.0000 | Freq: Once | INTRAVENOUS | Status: AC
  Administered 2021-04-19: 50 mL via INTRAVENOUS
  Filled 2021-04-19: qty 50

## 2021-04-19 MED ORDER — DARIFENACIN HYDROBROMIDE ER 7.5 MG PO TB24
7.5000 mg | ORAL_TABLET | Freq: Every day | ORAL | Status: DC
  Administered 2021-04-19 – 2021-04-21 (×3): 7.5 mg via ORAL
  Filled 2021-04-19 (×3): qty 1

## 2021-04-19 MED ORDER — LACTATED RINGERS IV BOLUS
500.0000 mL | Freq: Once | INTRAVENOUS | Status: AC
  Administered 2021-04-19: 500 mL via INTRAVENOUS

## 2021-04-19 MED ORDER — DONEPEZIL HCL 5 MG PO TABS
10.0000 mg | ORAL_TABLET | Freq: Every day | ORAL | Status: DC
  Administered 2021-04-19 – 2021-04-20 (×2): 10 mg via ORAL
  Filled 2021-04-19 (×2): qty 2

## 2021-04-19 MED ORDER — TAMSULOSIN HCL 0.4 MG PO CAPS
0.4000 mg | ORAL_CAPSULE | Freq: Every day | ORAL | Status: DC
  Administered 2021-04-19 – 2021-04-21 (×3): 0.4 mg via ORAL
  Filled 2021-04-19 (×3): qty 1

## 2021-04-19 MED ORDER — IPRATROPIUM-ALBUTEROL 0.5-2.5 (3) MG/3ML IN SOLN: 3.0000 mL | RESPIRATORY_TRACT | Status: DC | PRN

## 2021-04-19 NOTE — Progress Notes (Signed)
Inpatient Diabetes Program Recommendations  AACE/ADA: New Consensus Statement on Inpatient Glycemic Control (2015)  Target Ranges:  Prepandial:   less than 140 mg/dL      Peak postprandial:   less than 180 mg/dL (1-2 hours)      Critically ill patients:  140 - 180 mg/dL   Lab Results  Component Value Date   GLUCAP 187 (H) 04/19/2021   HGBA1C 8.7 (H) 04/08/2021    Review of Glycemic Control  Latest Reference Range & Units 04/19/21 06:18 04/19/21 07:13 04/19/21 08:49  Glucose-Capillary 70 - 99 mg/dL 223 (H) 238 (H) 187 (H)   Diabetes history: DM 2 Outpatient Diabetes medications:  Jardiance 25 mg daily Glucotrol 5 mg take 1/2 tablet 30 minutes before meals bid Lantus 30 units daily Metformin 1000 mg bid Current orders for Inpatient glycemic control:  IV insulin/DKA order set  Inpatient Diabetes Program Recommendations:    Blood sugars improving.  May want to recheck beta hydroxybutyric acid?  Anion gap still slightly elevated.    Once patient is ready for transition, consider Semglee 20 units 2 hours prior to d/c of insulin drip?  Also note that patient was on Jardiance 25 mg daily which can increase risk for DKA in some patients so may not want to resume at discharge? It appears patient saw PCP, Dr. Jenny Reichmann on 1/17 and there was some concern regarding medication adherence and possibly medication confusion?  Thanks,  Adah Perl, RN, BC-ADM Inpatient Diabetes Coordinator Pager [phone]  (8a-5p)

## 2021-04-19 NOTE — Consult Note (Signed)
PHARMACY CONSULT NOTE - FOLLOW UP  Pharmacy Consult for Electrolyte Monitoring and Replacement   Recent Labs: Potassium (mmol/L)  Date Value  04/19/2021 4.2   Magnesium (mg/dL)  Date Value  04/18/2021 2.9 (H)   Calcium (mg/dL)  Date Value  04/19/2021 8.6 (L)   Albumin (g/dL)  Date Value  04/18/2021 3.8   Phosphorus (mg/dL)  Date Value  04/19/2021 2.8   Sodium (mmol/L)  Date Value  04/19/2021 143     Assessment:  81 yo M presenting to [facility] ED on 04/18/21 from home with complaints of SOB that has been ongoing for a "couple of weeks". Started on insulin gtt.   Goal of Therapy:  WNL   Plan:  No replacement needed at this time.   Oswald Hillock ,PharmD Clinical Pharmacist 04/19/2021 8:33 AM

## 2021-04-19 NOTE — ED Notes (Signed)
Pt reporting chronic back pain. Pt repositioned on stretcher. Provider at bedside with this RN; verbal order to message once anion gap closed so can place orders for oral and/or subcut meds to be given with a 2 hour overlap with insulin gtt before d/c it.

## 2021-04-19 NOTE — ED Notes (Signed)
Verbal confirmation from attending CPatsey Berthold to stop insulin gtt.

## 2021-04-19 NOTE — ED Notes (Signed)
LR D5 bag running extremely low; may have 50cc left in bag; notified supply chain that none left in stock closet; staff stated they'll drop some new bags off soon.

## 2021-04-19 NOTE — ED Notes (Signed)
Lab contacted about missing lab orders.

## 2021-04-19 NOTE — ED Notes (Signed)
500 NS bolus started per verbal from provider Windy Canny.

## 2021-04-19 NOTE — ED Notes (Signed)
This RN to bedside. Lamont from lab, sonja, and this RN repositioned pt in bed and he had crawled down to end again. Pt calm when staff at bedside but as soon as staff leaves pt begins to yell out again; denies any needs. External cath repositioned. Stretcher locked low. Rails up. Call bell within reach. Pt educated about using call bell again.

## 2021-04-19 NOTE — ED Notes (Signed)
Pt prompted to reposition in bed; pt did so. HOB adjusted for pt. Pt given 2 cups of ice cold water as requested. Stretcher locked low. Rails up. Call bell within reach. Pt declined food offer again. External cath repositioned again.

## 2021-04-19 NOTE — Progress Notes (Signed)
NAME:  Allen Hunter., MRN:  [id number], DOB:  1941/02/26, LOS: 1 ADMISSION DATE:  04/18/2021, CONSULTATION DATE:  04/18/21 REFERRING MD:  Dr. Cheri Fowler, CHIEF COMPLAINT:  Shortness of Breath  History of Present Illness:  81 yo M presenting to [facility] ED on 04/18/21 from home with complaints of SOB that has been ongoing for a "couple of weeks". While waiting in the ED waiting room, it appears his confusion worsened. The patient's sister is bedside, providing history as the patient is still too confused for an in depth interview. 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 She has been concerned about his ability to manage his medications. However, states that this current mentation is very abnormal for him. He is usually alert & oriented, just at times forgetful, but independent with his ADL's.  She confirms that he does not use tobacco products and does not drink alcohol or use recreational substances.  She also confirms to the best of her knowledge that he does not wear oxygen at home.  She is also unaware of any recent complaints, i.e. fever/chills/abdominal pain/nausea/vomiting/diarrhea/chest pain/dysuria. Of note, per chart review the patient due to possible confusion was not taking his insulin as directed at home, 20 units daily instead of the recommended 30 units daily. ED course: Upon arrival patient noted to have a GCS of 10 with obvious Kussmaul respirations.   Medications given: NS bolus 1.5 mL, LR @ 125 mL/h & insulin drip started Initial Vitals: afebrile 97.2, tachypneic 26, tachycardic 119, BP- 117/68 & SpO2 97% on room air Significant labs: EKG Interpretation: Date: 04/18/21, EKG Time: 16:53, Rate: 119, Rhythm: ST, QRS Axis:  possible LAD, Intervals: normal, ST/T Wave abnormalities: none, Narrative Interpretation: Sinus Tachycardia Chemistry: Na+:138, K+: 5.9 > 4.9, BUN/Cr.: 40/2.42, Serum CO2/ AG: <7/not calc., Beta-hydroxy: > 8, glucose: 383 > 494 >  518 Hematology: WBC: 10.4, Hgb: 15.1,  Troponin: 5, PCT: pending, COVID-19 & Influenza A/B: pending VBG: < 6.9/ 37/ 57/ 5.8 CXR 04/18/21: no acute cardiopulmonary process CT head 04/18/21: pending  PCCM consulted for admission due to severe DKA with altered mental status & severe metabolic acidosis.  Pertinent  Medical History  HTN T2DM Prostate cancer HLD COPD & asthma OSA BPH   Significant Hospital Events: Including procedures, antibiotic start and stop dates in addition to other pertinent events   04/18/21: Patient admitted altered with severe DKA  and severe metabolic acidosis requiring sodium bicarbonate drip 04/19/21: Transitioning to subcu insulin  Interim History / Subjective:  Patient alert and responsive, but confused, however easily redirectable.  He is able to follow simple commands with repetition, moving all extremities.  He has baseline mild cognitive impairment.  Objective   Blood pressure 101/66, pulse 71, temperature 97.8 F (36.6 C), temperature source Oral, resp. rate 16, height 5\' 6"  (1.676 m), weight 77.1 kg, SpO2 95 %.        Intake/Output Summary (Last 24 hours) at 04/19/2021 1723 Last data filed at 04/19/2021 1457 Gross per 24 hour  Intake 940.4 ml  Output 550 ml  Net 390.4 ml   Filed Weights   04/18/21 1648  Weight: 77.1 kg    Examination: General: Adult male, lying in bed, pleasantly confused, easily redirectable, NAD HEENT: MM pink/dry, anicteric, atraumatic, neck supple Neuro: A&O x 2, follows simple commands, PERRL +3, MAE spontaneously CV: s1s2 RRR, ST on monitor, no r/m/g Pulm: No tachypnea, no Kussmaul respirations-no adventitious sounds GI: soft, rounded, non  tender, bs x 4 Skin: Limited exam- no rashes/lesions noted Extremities: warm/dry, pulses + 2 R/P, no edema noted  Resolved Hospital Problem list   DKA-transitioning to subcu insulin  Assessment & Plan:  Diabetic ketoacidosis  PMHx: T2DM Suspect patient has not been taking  his insulin properly as this was just discovered at recent PCP visit.  Only complaint is shortness of breath, chest x-ray clear and no leukocytosis or fever to suggest pneumonia. - DKA protocol, continue suspect will be able to transition to subcu insulin - sodium bicarbonate infusion completed - Aggressive IV hydration,blood glucose  below 250 added D5 to IV fluids - Insulin drip per Endo tool protocol - Transition to long-acting insulin with every 4 subcu coverage as tolerates - Strict I/O's: alert provider if UOP < 0.5 mL/kg/hr - Q 4 BMP, closely monitor potassium, replace electrolytes PRN - monitor blood glucose every 1 hour, per Endo tool protocol, transition to every 4 glucoses once of insulin drip - trend serum CO2/ AG/ beta-hydroxy, f/u A1C - Diabetes coordinator consult - Resume home insulin regiment once appropriate, recommend discontinuing Jardiance  Acute Kidney Injury  Severe metabolic acidosis Baseline Cr: 1.28-1.58, Cr on admission: 2.42 - bicarb supplementation completed - trend VBG - Strict I/O's: alert provider if UOP < 0.5 mL/kg/hr - gentle IVF hydration  - Daily BMP, replace electrolytes PRN - Avoid nephrotoxic agents as able, ensure adequate renal perfusion  Acute Encephalopathy secondary to severe DKA & acidosis in the setting of mild cognitive impairment - STAT CT head >> no acute abnormality - falls & aspiration precautions - consider MRI if mentation does not continue to improve with DKA Tx - [facility] for discharge needs - continue outpatient donepezil as mentation allows  COPD without exacerbation PHMx: OSA, asthma - supplemental O2 PRN to maintain SpO2 > 90% - budesonide nebs BID, bronchodilators PRN > restart home inhalers as mentation allows - outpatient montelukast restarted  Hyperlipidemia PMHx: HTN - continue outpatient Lipitor as mentation allows - continue ASA - outpatient losartan & bisoprolol on hold as BP is stable but  marginal  BPH - continue outpatient regimen as mentation allows: darifenacin & tamsulosin  Gout without exacerbation - allopurinol on hold due to AKI, consider restarting at lower dose as patient stabilizes  Best Practice (right click and "Reselect all SmartList Selections" daily)  Diet/type: NPO w/ oral meds DVT prophylaxis: prophylactic heparin  GI prophylaxis: PPI Lines: N/A Foley:  N/A Code Status:  full code Last date of multidisciplinary goals of care discussion [04/18/21- will need to re-address with patient as mentation clears]  Labs   CBC: Recent Labs  Lab 04/18/21 1700 04/19/21 0501  WBC 10.4 9.6  HGB 15.1 12.5*  HCT 50.9 39.5  MCV 100.6* 95.6  PLT 271 179     Basic Metabolic Panel: Recent Labs  Lab 04/18/21 2103 04/18/21 2117 04/19/21 0239 04/19/21 0501 04/19/21 0654 04/19/21 1319  NA 138  --  142 143 143 143  K 4.9  --  5.9* 3.7 4.2 5.1  CL 99  --  99 109 104 111  CO2 <7*  --  18* 19* 21* 19*  GLUCOSE 546*  --  277* 157* 210* 187*  BUN 47*  --  43* 40* 42* 35*  CREATININE 2.87*  --  2.52* 1.91* 2.10* 1.59*  CALCIUM 9.6  --  7.9* 8.5* 8.6* 7.6*  MG  --  2.9*  --  2.1  --   --   PHOS  --  11.5*  --  2.4* 2.8  --     GFR: Estimated Creatinine Clearance: 35 mL/min (A) (by C-G formula based on SCr of 1.59 mg/dL (H)). Recent Labs  Lab 04/18/21 1700 04/18/21 2108 04/19/21 0501  PROCALCITON  --  0.23 0.86  WBC 10.4  --  9.6     Liver Function Tests: Recent Labs  Lab 04/18/21 2117  AST 19  ALT 23  ALKPHOS 71  BILITOT 1.0  PROT 6.9  ALBUMIN 3.8   No results for input(s): LIPASE, AMYLASE in the last 168 hours. No results for input(s): AMMONIA in the last 168 hours.  ABG    Component Value Date/Time   HCO3 26.6 04/19/2021 0501   ACIDBASEDEF 15.3 (H) 04/19/2021 0238   O2SAT 63.3 04/19/2021 0501      Coagulation Profile: No results for input(s): INR, PROTIME in the last 168 hours.  Cardiac Enzymes: No results for input(s):  CKTOTAL, CKMB, CKMBINDEX, TROPONINI in the last 168 hours.  HbA1C: Hgb A1c MFr Bld  Date/Time Value Ref Range Status  04/08/2021 02:59 PM 8.7 (H) 4.6 - 6.5 % Final    Comment:    Glycemic Control Guidelines for People with Diabetes:Non Diabetic:  <6%Goal of Therapy: <7%Additional Action Suggested:  >8%   11/14/2020 10:32 AM 8.5 (H) 4.6 - 6.5 % Final    Comment:    Glycemic Control Guidelines for People with Diabetes:Non Diabetic:  <6%Goal of Therapy: <7%Additional Action Suggested:  >8%     CBG: Recent Labs  Lab 04/19/21 1013 04/19/21 1159 04/19/21 1308 04/19/21 1426 04/19/21 1555  GLUCAP 140* 197* 173* 176* 159*     Review of Systems:   Patient too altered for in depth interview, however, states "I do not feel good" but cannot elaborate  Allergies No Known Allergies   Medications  Scheduled Meds:  aspirin EC  81 mg Oral Daily   atorvastatin  80 mg Oral Daily   budesonide (PULMICORT) nebulizer solution  0.25 mg Nebulization BID   darifenacin  7.5 mg Oral Daily   donepezil  10 mg Oral QHS   heparin  5,000 Units Subcutaneous Q8H   insulin aspart  0-24 Units Subcutaneous Q4H   insulin glargine-yfgn  20 Units Subcutaneous Daily   montelukast  10 mg Oral QHS   pantoprazole (PROTONIX) IV  40 mg Intravenous QHS   tamsulosin  0.4 mg Oral Daily   Continuous Infusions:  dextrose 5% lactated ringers 125 mL/hr at 04/19/21 1229   insulin 2.4 Units/hr (04/19/21 1556)   lactated ringers Stopped (04/19/21 0352)   PRN Meds:.dextrose, docusate sodium, ipratropium-albuterol, polyethylene glycol   Critical care time: 40 minutes    Patient appears to be closing his anion gap.  Should be able to transition today to subcu insulin.  Status can be switched to stepdown.  Can transition to hospitalist service in the morning.  Renold Don, MD Advanced Bronchoscopy PCCM [facility]    *This note was dictated using voice recognition software/Dragon.  Despite  best efforts to proofread, errors can occur which can change the meaning. Any transcriptional errors that result from this process are unintentional and may not be fully corrected at the time of dictation.

## 2021-04-19 NOTE — ED Notes (Signed)
Pt refusing to let this RN get bloodwork. Lab called to try to get labs.

## 2021-04-19 NOTE — ED Notes (Signed)
Attending states will place diet order soon. Pt offered food and drink; pt declined.

## 2021-04-19 NOTE — ED Notes (Signed)
Called ICU to check if okay to go ahead and take pt to bed 18. Left name and ascom number.

## 2021-04-19 NOTE — ED Notes (Signed)
Called charge RN to see if LR D5 bags kept anywhere besides the stock room as still none present; denied. Called supply chain again as pt's bag about to run dry while still on insulin gtt. Staff stated they're on the way with bags.

## 2021-04-19 NOTE — ED Notes (Signed)
Pt found shifted down in bed again with external cath loose. Pt repositioned. Pillow placed under pt's knees. New external cath applied. Stretcher locked low. Rails up. Call bell within reach. Pt denies any needs. Blankets adjusted for pt.

## 2021-04-20 DIAGNOSIS — N179 Acute kidney failure, unspecified: Secondary | ICD-10-CM

## 2021-04-20 DIAGNOSIS — E785 Hyperlipidemia, unspecified: Secondary | ICD-10-CM

## 2021-04-20 DIAGNOSIS — J449 Chronic obstructive pulmonary disease, unspecified: Secondary | ICD-10-CM

## 2021-04-20 DIAGNOSIS — G9341 Metabolic encephalopathy: Secondary | ICD-10-CM

## 2021-04-20 DIAGNOSIS — I10 Essential (primary) hypertension: Secondary | ICD-10-CM

## 2021-04-20 DIAGNOSIS — M109 Gout, unspecified: Secondary | ICD-10-CM

## 2021-04-20 DIAGNOSIS — E0810 Diabetes mellitus due to underlying condition with ketoacidosis without coma: Secondary | ICD-10-CM

## 2021-04-20 DIAGNOSIS — E1110 Type 2 diabetes mellitus with ketoacidosis without coma: Principal | ICD-10-CM

## 2021-04-20 DIAGNOSIS — E1165 Type 2 diabetes mellitus with hyperglycemia: Secondary | ICD-10-CM

## 2021-04-20 DIAGNOSIS — N400 Enlarged prostate without lower urinary tract symptoms: Secondary | ICD-10-CM

## 2021-04-20 LAB — CBC
HCT: 38.7 % — ABNORMAL LOW (ref 39.0–52.0)
Hemoglobin: 12.7 g/dL — ABNORMAL LOW (ref 13.0–17.0)
MCH: 29.8 pg (ref 26.0–34.0)
MCHC: 32.8 g/dL (ref 30.0–36.0)
MCV: 90.8 fL (ref 80.0–100.0)
Platelets: 155 10*3/uL (ref 150–400)
RBC: 4.26 MIL/uL (ref 4.22–5.81)
RDW: 17.1 % — ABNORMAL HIGH (ref 11.5–15.5)
WBC: 6.8 10*3/uL (ref 4.0–10.5)
nRBC: 0.3 % — ABNORMAL HIGH (ref 0.0–0.2)

## 2021-04-20 LAB — BASIC METABOLIC PANEL
Anion gap: 5 (ref 5–15)
BUN: 28 mg/dL — ABNORMAL HIGH (ref 8–23)
CO2: 29 mmol/L (ref 22–32)
Calcium: 8.5 mg/dL — ABNORMAL LOW (ref 8.9–10.3)
Chloride: 109 mmol/L (ref 98–111)
Creatinine, Ser: 1.17 mg/dL (ref 0.61–1.24)
GFR, Estimated: >60 mL/min (ref >60)
Glucose, Bld: 106 mg/dL — ABNORMAL HIGH (ref 70–99)
Potassium: 3.5 mmol/L (ref 3.5–5.1)
Sodium: 143 mmol/L (ref 135–145)

## 2021-04-20 LAB — PHOSPHORUS: Phosphorus: 1.5 mg/dL — ABNORMAL LOW (ref 2.5–4.6)

## 2021-04-20 LAB — CBG MONITORING, ED
Glucose-Capillary: 155 mg/dL — ABNORMAL HIGH (ref 70–99)
Glucose-Capillary: 168 mg/dL — ABNORMAL HIGH (ref 70–99)
Glucose-Capillary: 104 mg/dL — ABNORMAL HIGH (ref 70–99)
Glucose-Capillary: 249 mg/dL — ABNORMAL HIGH (ref 70–99)

## 2021-04-20 LAB — GLUCOSE, CAPILLARY
Glucose-Capillary: 219 mg/dL — ABNORMAL HIGH (ref 70–99)
Glucose-Capillary: 267 mg/dL — ABNORMAL HIGH (ref 70–99)
Glucose-Capillary: 297 mg/dL — ABNORMAL HIGH (ref 70–99)
Glucose-Capillary: 347 mg/dL — ABNORMAL HIGH (ref 70–99)

## 2021-04-20 LAB — MAGNESIUM: Magnesium: 2.2 mg/dL (ref 1.7–2.4)

## 2021-04-20 LAB — PROCALCITONIN: Procalcitonin: 0.66 ng/mL

## 2021-04-20 MED ORDER — INSULIN ASPART 100 UNIT/ML IJ SOLN
0.0000 [IU] | Freq: Every day | INTRAMUSCULAR | Status: DC
  Administered 2021-04-20: 3 [IU] via SUBCUTANEOUS
  Filled 2021-04-20: qty 1

## 2021-04-20 MED ORDER — INSULIN ASPART 100 UNIT/ML IJ SOLN
0.0000 [IU] | Freq: Three times a day (TID) | INTRAMUSCULAR | Status: DC
  Administered 2021-04-20 (×2): 3 [IU] via SUBCUTANEOUS
  Administered 2021-04-21: 12:00:00 7 [IU] via SUBCUTANEOUS
  Administered 2021-04-21: 3 [IU] via SUBCUTANEOUS
  Filled 2021-04-20 (×4): qty 1

## 2021-04-20 NOTE — Progress Notes (Signed)
PROGRESS NOTE    [name]  STM:[id number] DOB: 1940/08/19 DOA: 04/18/2021 PCP: [name], MD    Brief Narrative:   81 yo M presenting to [facility] ED on 04/18/21 from home with complaints of SOB that has been ongoing for a "couple of weeks". While waiting in the ED waiting room, it appears his confusion worsened. The patient's sister is bedside, providing history as the patient is still too confused for an in depth interview. 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 She has been concerned about his ability to manage his medications. However, states that this current mentation is very abnormal for him. He is usually alert & oriented, just at times forgetful, but independent with his ADL's.  She confirms that he does not use tobacco products and does not drink alcohol or use recreational substances.  She also confirms to the best of her knowledge that he does not wear oxygen at home.   Assessment & Plan:   Principal Problem:   DKA (diabetic ketoacidosis) ([place]) Active Problems:   Hyperlipidemia   GOUT   COPD (chronic obstructive pulmonary disease) (HCC)   BPH (benign prostatic hyperplasia)   Mild cognitive impairment   Acute metabolic encephalopathy  Diabetic ketoacidosis Type 2 diabetes mellitus, poor controlled Unclear trigger but suspect nonadherence to medications as well as dietary indiscretion Patient markedly acidotic on admission DKA protocol initiated with insulin gtt., aggressive hydration, sodium bicarb infusion Anion gap closed, clinical status improved Transition to subcutaneous regimen Diabetes coordinator consulted Plan: Advance diet to carb modified Basal bolus regimen DC IV fluids if tolerating p.o. Daily BMP Diabetes coordinator consult Anticipate discharge 8/92  Acute metabolic encephalopathy Appears improved CT head negative Likely in setting of DKA Continue outpatient donepezil  Acute kidney  injury Metabolic acidosis Suspect secondary to DKA Kidney function improving Acidosis resolved Plan: DC IV fluids Daily renal function Avoid hypotension and nephrotoxins  COPD No evidence of acute exacerbation Submental oxygen as necessary Bronchodilators  Hyperlipidemia Hypertension PTA losartan and bisoprolol currently on hold Continue to hold until blood pressure allows restarting PTA statin  BPH PTA darifenacin and tamsulosin  History of gout Currently allopurinol on hold Consider restarting in the next 24 hours   DVT prophylaxis: SQ heparin Code Status: Full Family Communication: Offered to call, patient declined Disposition Plan: Status is: Inpatient  Remains inpatient appropriate because: Severe DKA associated with metabolic acidosis.  Improving.  Anticipate discharge 1/30 after titration of insulin regimen       Level of care: Med-Surg  Consultants:  None  Procedures:  None  Antimicrobials: None   Subjective: Seen and examined.  Appears to be mentating clearly.  Baseline is unclear the patient is oriented x3.  No apparent distress.  No pain complaints  Objective: Vitals:   04/20/21 1021 04/20/21 1030 04/20/21 1100 04/20/21 1130  BP: (!) 98/55 118/73 100/65 111/69  Pulse:  71 83 72  Resp:  16 19 18   Temp:      TempSrc:      SpO2:  99% 96% 93%  Weight:      Height:        Intake/Output Summary (Last 24 hours) at 04/20/2021 1341 Last data filed at 04/19/2021 1953 Gross per 24 hour  Intake --  Output 550 ml  Net -550 ml   Filed Weights   04/18/21 1648  Weight: 77.1 kg    Examination:  General exam: No acute distress Respiratory system:  Lungs clear.  Normal work of breathing.  2 L Cardiovascular system: S1-S2, RRR, no murmurs, no pedal edema Gastrointestinal system: Soft, NT/ND, normal bowel sounds Central nervous system: Alert, oriented x3, no focal deficits Extremities: Symmetric 5 x 5 power. Skin: No rashes, lesions or  ulcers Psychiatry: Judgement and insight appear normal. Mood & affect appropriate.     Data Reviewed: I have personally reviewed following labs and imaging studies  CBC: Recent Labs  Lab 04/18/21 1700 04/19/21 0501 04/20/21 0722  WBC 10.4 9.6 6.8  HGB 15.1 12.5* 12.7*  HCT 50.9 39.5 38.7*  MCV 100.6* 95.6 90.8  PLT 271 179 076   Basic Metabolic Panel: Recent Labs  Lab 04/18/21 2117 04/19/21 0239 04/19/21 0501 04/19/21 0654 04/19/21 1319 04/20/21 0722  NA  --  142 143 143 143 143  K  --  5.9* 3.7 4.2 5.1 3.5  CL  --  99 109 104 111 109  CO2  --  18* 19* 21* 19* 29  GLUCOSE  --  277* 157* 210* 187* 106*  BUN  --  43* 40* 42* 35* 28*  CREATININE  --  2.52* 1.91* 2.10* 1.59* 1.17  CALCIUM  --  7.9* 8.5* 8.6* 7.6* 8.5*  MG 2.9*  --  2.1  --   --  2.2  PHOS 11.5*  --  2.4* 2.8  --  1.5*   GFR: Estimated Creatinine Clearance: 47.6 mL/min (by C-G formula based on SCr of 1.17 mg/dL). Liver Function Tests: Recent Labs  Lab 04/18/21 2117  AST 19  ALT 23  ALKPHOS 71  BILITOT 1.0  PROT 6.9  ALBUMIN 3.8   No results for input(s): LIPASE, AMYLASE in the last 168 hours. No results for input(s): AMMONIA in the last 168 hours. Coagulation Profile: No results for input(s): INR, PROTIME in the last 168 hours. Cardiac Enzymes: No results for input(s): CKTOTAL, CKMB, CKMBINDEX, TROPONINI in the last 168 hours. BNP (last 3 results) No results for input(s): PROBNP in the last 8760 hours. HbA1C: No results for input(s): HGBA1C in the last 72 hours. CBG: Recent Labs  Lab 04/19/21 2123 04/20/21 0102 04/20/21 0402 04/20/21 0749 04/20/21 1155  GLUCAP 118* 155* 168* 104* 249*   Lipid Profile: No results for input(s): CHOL, HDL, LDLCALC, TRIG, CHOLHDL, LDLDIRECT in the last 72 hours. Thyroid Function Tests: No results for input(s): TSH, T4TOTAL, FREET4, T3FREE, THYROIDAB in the last 72 hours. Anemia Panel: No results for input(s): VITAMINB12, FOLATE, FERRITIN, TIBC,  IRON, RETICCTPCT in the last 72 hours. Sepsis Labs: Recent Labs  Lab 04/18/21 2108 04/19/21 0501 04/20/21 0722  PROCALCITON 0.23 0.86 0.66    Recent Results (from the past 240 hour(s))  Resp Panel by RT-PCR (Flu A&B, Covid) Nasopharyngeal Swab     Status: None   Collection Time: 04/18/21 12:35 AM   Specimen: Nasopharyngeal Swab; Nasopharyngeal(NP) swabs in vial transport medium  Result Value Ref Range Status   SARS Coronavirus 2 by RT PCR NEGATIVE NEGATIVE Final    Comment: (NOTE) SARS-CoV-2 target nucleic acids are NOT DETECTED.  The SARS-CoV-2 RNA is generally detectable in upper respiratory specimens during the acute phase of infection. The lowest concentration of SARS-CoV-2 viral copies this assay can detect is 138 copies/mL. A negative result does not preclude SARS-Cov-2 infection and should not be used as the sole basis for treatment or other patient management decisions. A negative result may occur with  improper specimen collection/handling, submission of specimen other than nasopharyngeal swab, presence of viral mutation(s) within  the areas targeted by this assay, and inadequate number of viral copies(<138 copies/mL). A negative result must be combined with clinical observations, patient history, and epidemiological information. The expected result is Negative.  Fact Sheet for Patients:  EntrepreneurPulse.com.au  Fact Sheet for Healthcare Providers:  IncredibleEmployment.be  This test is no t yet approved or cleared by the [place] FDA and  has been authorized for detection and/or diagnosis of SARS-CoV-2 by FDA under an Emergency Use Authorization (EUA). This EUA will remain  in effect (meaning this test can be used) for the duration of the COVID-19 declaration under Section 564(b)(1) of the Act, 21 U.S.C.section 360bbb-3(b)(1), unless the authorization is terminated  or revoked sooner.       Influenza A by PCR NEGATIVE  NEGATIVE Final   Influenza B by PCR NEGATIVE NEGATIVE Final    Comment: (NOTE) The Xpert Xpress SARS-CoV-2/FLU/RSV plus assay is intended as an aid in the diagnosis of influenza from Nasopharyngeal swab specimens and should not be used as a sole basis for treatment. Nasal washings and aspirates are unacceptable for Xpert Xpress SARS-CoV-2/FLU/RSV testing.  Fact Sheet for Patients: EntrepreneurPulse.com.au  Fact Sheet for Healthcare Providers: IncredibleEmployment.be  This test is not yet approved or cleared by the [place] FDA and has been authorized for detection and/or diagnosis of SARS-CoV-2 by FDA under an Emergency Use Authorization (EUA). This EUA will remain in effect (meaning this test can be used) for the duration of the COVID-19 declaration under Section 564(b)(1) of the Act, 21 U.S.C. section 360bbb-3(b)(1), unless the authorization is terminated or revoked.  Performed at [facility], [address] [place], [place] [address]          Radiology Studies: DG Chest 2 View  Result Date: 04/18/2021 CLINICAL DATA:  Short of breath and cough for 1 week EXAM: CHEST - 2 VIEW COMPARISON:  07/20/2020 FINDINGS: Frontal and lateral views of the chest demonstrate an unremarkable cardiac silhouette. No airspace disease, effusion, or pneumothorax. No acute bony abnormality. IMPRESSION: 1. No acute intrathoracic process. Electronically Signed   By: Randa Ngo M.D.   On: 04/18/2021 17:31   CT HEAD WO CONTRAST (5MM)  Result Date: 04/18/2021 CLINICAL DATA:  Altered level of consciousness, shortness of breath and cough EXAM: CT HEAD WITHOUT CONTRAST TECHNIQUE: Contiguous axial images were obtained from the base of the skull through the vertex without intravenous contrast. RADIATION DOSE REDUCTION: This exam was performed according to the departmental dose-optimization program which includes automated exposure control, adjustment of  the mA and/or kV according to patient size and/or use of iterative reconstruction technique. COMPARISON:  07/20/2020, 09/24/2020 FINDINGS: Brain: No acute infarct or hemorrhage. Stable cerebral atrophy. Lateral ventricles and midline structures are unremarkable. No acute extra-axial fluid collections. No mass effect. Vascular: No hyperdense vessel or unexpected calcification. Skull: Normal. Negative for fracture or focal lesion. Stable sebaceous cyst right parietal scalp. Sinuses/Orbits: No acute finding. Other: None. IMPRESSION: 1. Stable head CT, no acute intracranial process. Electronically Signed   By: Randa Ngo M.D.   On: 04/18/2021 23:23        Scheduled Meds:  aspirin EC  81 mg Oral Daily   atorvastatin  80 mg Oral Daily   budesonide (PULMICORT) nebulizer solution  0.25 mg Nebulization BID   darifenacin  7.5 mg Oral Daily   donepezil  10 mg Oral QHS   heparin  5,000 Units Subcutaneous Q8H   insulin aspart  0-5 Units Subcutaneous QHS   insulin aspart  0-9 Units  Subcutaneous TID WC   insulin glargine-yfgn  20 Units Subcutaneous Daily   montelukast  10 mg Oral QHS   pantoprazole (PROTONIX) IV  40 mg Intravenous QHS   tamsulosin  0.4 mg Oral Daily   Continuous Infusions:  lactated ringers Stopped (04/19/21 0352)     LOS: 2 days    Time spent: 50 minutes    Sidney Ace, MD [facility]   If 7PM-7AM, please contact night-coverage  04/20/2021, 1:41 PM

## 2021-04-20 NOTE — Progress Notes (Signed)
Inpatient Diabetes Program Recommendations  AACE/ADA: New Consensus Statement on Inpatient Glycemic Control (2015)  Target Ranges:  Prepandial:   less than 140 mg/dL      Peak postprandial:   less than 180 mg/dL (1-2 hours)      Critically ill patients:  140 - 180 mg/dL   Lab Results  Component Value Date   GLUCAP 104 (H) 04/20/2021   HGBA1C 8.7 (H) 04/08/2021    Review of Glycemic Control  Latest Reference Range & Units 04/20/21 01:02 04/20/21 04:02 04/20/21 07:49  Glucose-Capillary 70 - 99 mg/dL 155 (H) 168 (H) 104 (H)  Diabetes history: DM 2 Outpatient Diabetes medications:  Jardiance 25 mg daily Glucotrol 5 mg take 1/2 tablet 30 minutes before meals bid Lantus 30 units daily Metformin 1000 mg bid Current orders for Inpatient glycemic control:  Lantus 20 units daily Novolog 0-24 units q 4 hours Inpatient Diabetes Program Recommendations:    Please reduce Novolog correction to sensitive tid with meals and HS (instead of q 4 hours). It appears that patient possibly was not taking insulin as ordered?  Will follow.   Thanks,  Adah Perl, RN, BC-ADM Inpatient Diabetes Coordinator Pager [phone]  (8a-5p)

## 2021-04-20 NOTE — ED Notes (Signed)
Pt given a sandwich tray and ice water.

## 2021-04-20 NOTE — Evaluation (Signed)
Physical Therapy Evaluation Patient Details Name: Allen Hunter. MRN: [id number] DOB: 13-Apr-1940 Today's Date: 04/20/2021  History of Present Illness  Patient is an 81 year old male who presented to hospital for SOB with confusion. Admitted for severe DKA with AMS and severe metabolic acidosis. PMH includes asthma, back pain, COPD, DM type II, GERD, Gout, HLD, HTN, OSA, prostate cancer.   Clinical Impression  Patient is a pleasantly confused 81 year old male who presents with generalized weakness. Full evaluation was limited due to patient poor safety awareness as well as elevated RR >47 with rapidly raising HR. Prior to hospital admission, pt was living alone in a one story home without use of an AD.  Patient is in bed upon PT arrival and agreeable to participate with therapy. He is able to answer simple one step questions but is challenged with multistep commands. He is oriented to self and location but tangential and easily confused. Patient able to reach EOB where BP was monitored and within functional range (109/63). Patient then transitioned to standing with min A and max cueing for safety with patient demonstrating very poor safety awareness. Patient performed three steps laterally and then marched however standing was abruptly terminated due to RR > 47 and his heart rate rapidly raising. Patient returned to supine with vitals returning to therapeutic range. Nursing notified of this change.  Pt would benefit from skilled PT to address noted impairments and functional limitations (see below for any additional details).  Upon hospital discharge, pt would benefit from SNF due to poor safety awareness and high fall risk; patient additionally would benefit from 24/7 care at this time.        Recommendations for follow up therapy are one component of a multi-disciplinary discharge planning process, led by the attending physician.  Recommendations may be updated based on patient status, additional  functional criteria and insurance authorization.  Follow Up Recommendations Skilled nursing-short term rehab (<3 hours/day)    Assistance Recommended at Discharge Frequent or constant Supervision/Assistance  Patient can return home with the following  A lot of help with walking and/or transfers;A lot of help with bathing/dressing/bathroom;Assistance with cooking/housework;Help with stairs or ramp for entrance;Assist for transportation;Direct supervision/assist for financial management;Direct supervision/assist for medications management    Equipment Recommendations BSC/3in1;Rolling walker (2 wheels)  Recommendations for Other Services       Functional Status Assessment Patient has had a recent decline in their functional status and demonstrates the ability to make significant improvements in function in a reasonable and predictable amount of time.     Precautions / Restrictions Precautions Precautions: Fall Restrictions Weight Bearing Restrictions: No      Mobility  Bed Mobility Overal bed mobility: Modified Independent             General bed mobility comments: needs additional time and cueing for safety    Transfers Overall transfer level: Needs assistance Equipment used: Rolling walker (2 wheels) Transfers: Sit to/from Stand Sit to Stand: Min assist           General transfer comment: max cueing for sequencing, UE placement, and safety    Ambulation/Gait Ambulation/Gait assistance: Min assist Gait Distance (Feet): 3 Feet Assistive device: Rolling walker (2 wheels)         General Gait Details: side steps performed EOB; had to return to seated position due to RR > 47 and rapidly raising HR.  Stairs            Emergency planning/management officer  Modified Rankin (Stroke Patients Only)       Balance Overall balance assessment: Needs assistance Sitting-balance support: Feet supported, Single extremity supported Sitting balance-Leahy Scale: Fair Sitting  balance - Comments: able to lift LE's without LOB with SUE support Postural control: Posterior lean Standing balance support: Bilateral upper extremity supported, During functional activity Standing balance-Leahy Scale: Fair Standing balance comment: able to stand, poor safety awareness                             Pertinent Vitals/Pain Pain Assessment Pain Assessment: No/denies pain    Home Living Family/patient expects to be discharged to:: Private residence Living Arrangements: Alone Available Help at Discharge: Available PRN/intermittently Type of Home: House Home Access: Stairs to enter Entrance Stairs-Rails: Left Entrance Stairs-Number of Steps: 3   Home Layout: One level Home Equipment: Grab bars - tub/shower Additional Comments: Patient is intermittently confused, can answer simple questions but not complex.    Prior Function Prior Level of Function : Patient poor historian/Family not available             Mobility Comments: Per patient he has been more unstable lately but does not walk with an AD ADLs Comments: patient reports he does everything himself but then also states he has a hard time and then becomes confused.     Hand Dominance   Dominant Hand: Right    Extremity/Trunk Assessment   Upper Extremity Assessment Upper Extremity Assessment: Defer to OT evaluation    Lower Extremity Assessment Lower Extremity Assessment: Generalized weakness (grossly 4-/5 bilaterally)    Cervical / Trunk Assessment Cervical / Trunk Assessment: Normal  Communication   Communication: No difficulties  Cognition Arousal/Alertness: Awake/alert Behavior During Therapy: WFL for tasks assessed/performed Overall Cognitive Status: No family/caregiver present to determine baseline cognitive functioning                                 General Comments: Patient is able to follow simple commands, unable to follow multi step commands, very unsafe/poor  safety awareness        General Comments General comments (skin integrity, edema, etc.): patietn appears well nourished and groomed    Exercises Other Exercises Other Exercises: Patient educated on role of PT in acute care setting, safe mobility and transfers, safety awareness. March in place at [facility]   Assessment/Plan    PT Assessment Patient needs continued PT services  PT Problem List Decreased strength;Decreased activity tolerance;Decreased balance;Decreased mobility;Decreased coordination;Cardiopulmonary status limiting activity;Decreased safety awareness;Decreased knowledge of use of DME       PT Treatment Interventions DME instruction;Gait training;Stair training;Functional mobility training;Therapeutic activities;Therapeutic exercise;Manual techniques;Patient/family education;Cognitive remediation;Neuromuscular re-education;Balance training    PT Goals (Current goals can be found in the Care Plan section)  Acute Rehab PT Goals Patient Stated Goal: patient confused PT Goal Formulation: Patient unable to participate in goal setting    Frequency Min 2X/week     Co-evaluation               AM-PAC PT "6 Clicks" Mobility  Outcome Measure Help needed turning from your back to your side while in a flat bed without using bedrails?: A Little Help needed moving from lying on your back to sitting on the side of a flat bed without using bedrails?: A Little Help needed moving to and from a bed to a chair (including a wheelchair)?: A Little  Help needed standing up from a chair using your arms (e.g., wheelchair or bedside chair)?: A Little Help needed to walk in hospital room?: A Lot Help needed climbing 3-5 steps with a railing? : A Lot 6 Click Score: 16    End of Session Equipment Utilized During Treatment: Gait belt Activity Tolerance: Patient tolerated treatment well;Treatment limited secondary to medical complications (Comment) (RR>47 with rapidly raising HR) Patient  left: in bed;with call bell/phone within reach;with bed alarm set Nurse Communication: Mobility status (RR elevation) PT Visit Diagnosis: Unsteadiness on feet (R26.81);Other abnormalities of gait and mobility (R26.89);Muscle weakness (generalized) (M62.81);Difficulty in walking, not elsewhere classified (R26.2)    Time: 0867-6195 PT Time Calculation (min) (ACUTE ONLY): 24 min   Charges:   PT Evaluation $PT Eval Moderate Complexity: 1 Mod PT Treatments $Therapeutic Activity: 8-22 mins        Janna Arch, PT, DPT  04/20/2021, 10:30 AM

## 2021-04-20 NOTE — Consult Note (Signed)
PHARMACY CONSULT NOTE - FOLLOW UP  Pharmacy Consult for Electrolyte Monitoring and Replacement   Recent Labs: Potassium (mmol/L)  Date Value  04/19/2021 5.1   Magnesium (mg/dL)  Date Value  04/19/2021 2.1   Calcium (mg/dL)  Date Value  04/19/2021 7.6 (L)   Albumin (g/dL)  Date Value  04/18/2021 3.8   Phosphorus (mg/dL)  Date Value  04/19/2021 2.8   Sodium (mmol/L)  Date Value  04/19/2021 143     Assessment:  81 yo M presenting to [facility] ED on 04/18/21 from home with complaints of SOB that has been ongoing for a "couple of weeks". Insulin gtt d/c'ed.   Goal of Therapy:  WNL   Plan:  No replacement needed at this time.  F/u with AM labs.   Oswald Hillock ,PharmD Clinical Pharmacist 04/20/2021 7:47 AM

## 2021-04-20 NOTE — Evaluation (Signed)
Occupational Therapy Evaluation Patient Details Name: Allen Hunter. MRN: [id number] DOB: [birth year] Today's Date: 04/20/2021   History of Present Illness Patient is an 81 year old male who presented to hospital for SOB with confusion. Admitted for severe DKA with AMS and severe metabolic acidosis. PMH includes asthma, back pain, COPD, DM type II, GERD, Gout, HLD, HTN, OSA, prostate cancer.   Clinical Impression   Pt was seen for OT evaluation this date. Prior to hospital admission, pt was living by himself in a 1 story home, driving, and preparing light meals primarily with a microwave. Niece present and endorses assisting with set up weekly pill box with pt. Pt denies falls, alert, and oriented x2. Currently pt demonstrates impairments as described below (See OT problem list) which functionally limit his ability to perform ADL/self-care tasks. Pt currently requires MIN A for LB Dressing involving standing, MIN A for ADL transfers, and set up/supv for seated UB ADL and medication mgt. RR up to high 30's to low 40's with bed mobility/standing and pt endorsing feeling SOB at those time. SpO2 >94% on room air, HR <90bpm.  Pt would benefit from skilled OT services to address noted impairments and functional limitations (see below for any additional details) in order to maximize safety and independence while minimizing falls risk and caregiver burden. Upon hospital discharge, recommend STR to maximize pt safety and return to PLOF. Pt hopeful to get better more quickly in order to return home with family.    Recommendations for follow up therapy are one component of a multi-disciplinary discharge planning process, led by the attending physician.  Recommendations may be updated based on patient status, additional functional criteria and insurance authorization.   Follow Up Recommendations  Skilled nursing-short term rehab (<3 hours/day)    Assistance Recommended at Discharge Frequent or constant  Supervision/Assistance  Patient can return home with the following A little help with walking and/or transfers;A little help with bathing/dressing/bathroom;Assistance with cooking/housework;Assist for transportation;Help with stairs or ramp for entrance;Direct supervision/assist for medications management    Functional Status Assessment  Patient has had a recent decline in their functional status and demonstrates the ability to make significant improvements in function in a reasonable and predictable amount of time.  Equipment Recommendations  BSC/3in1    Recommendations for Other Services       Precautions / Restrictions Precautions Precautions: Fall Restrictions Weight Bearing Restrictions: No      Mobility Bed Mobility Overal bed mobility: Needs Assistance Bed Mobility: Supine to Sit, Sit to Supine     Supine to sit: Min assist, HOB elevated Sit to supine: Supervision        Transfers Overall transfer level: Needs assistance Equipment used: 1 person hand held assist Transfers: Sit to/from Stand Sit to Stand: Min assist                  Balance Overall balance assessment: Needs assistance Sitting-balance support: Feet supported, Single extremity supported Sitting balance-Leahy Scale: Fair Sitting balance - Comments: able to complete doffing and donning socks seated EOB using figure 4 and no LOB   Standing balance support: Single extremity supported, During functional activity Standing balance-Leahy Scale: Fair Standing balance comment: fair-                           ADL either performed or assessed with clinical judgement   ADL Overall ADL's : Needs assistance/impaired  General ADL Comments: Pt completes doffing and donning socks seated EOB using figure 4 technique with VC to use, MIN A for LB dressing in standing, set up and supv for seated UB ADL, MIN A for ADL transfers     Vision          Perception     Praxis      Pertinent Vitals/Pain Pain Assessment Pain Assessment: No/denies pain     Hand Dominance Right   Extremity/Trunk Assessment Upper Extremity Assessment Upper Extremity Assessment: Generalized weakness   Lower Extremity Assessment Lower Extremity Assessment: Generalized weakness   Cervical / Trunk Assessment Cervical / Trunk Assessment: Normal   Communication Communication Communication: No difficulties   Cognition Arousal/Alertness: Awake/alert Behavior During Therapy: WFL for tasks assessed/performed Overall Cognitive Status: Impaired/Different from baseline Area of Impairment: Orientation, Following commands, Safety/judgement                 Orientation Level: Disoriented to, Time, Situation     Following Commands: Follows multi-step commands inconsistently Safety/Judgement: Decreased awareness of safety, Decreased awareness of deficits           General Comments  patietn appears well nourished and groomed    Exercises     Shoulder Instructions      Home Living Family/patient expects to be discharged to:: Private residence Living Arrangements: Alone Available Help at Discharge: Available PRN/intermittently Type of Home: House Home Access: Stairs to enter [employer] of Steps: 3 Entrance Stairs-Rails: Left Home Layout: One level     Bathroom Shower/Tub: Walk-in shower;Door;Tub only   Biochemist, clinical: [place]: Grab bars - tub/shower;Shower seat - built Medical sales representative (2 wheels)   Additional Comments: Patient is intermittently confused, can answer simple questions but not complex.      Prior Functioning/Environment Prior Level of Function : Patient poor historian/Family not available;Driving;Independent/Modified Independent             Mobility Comments: Per patient he has been more unstable lately but does not walk with an AD ADLs Comments: patient reports he does  everything himself but then also states he has a hard time; reports niece assists with set up weekly pill box, pt endorses driving, light meal prep (mostly microwave), and denies falls.        OT Problem List: Decreased strength;Decreased activity tolerance;Decreased safety awareness;Impaired balance (sitting and/or standing);Cardiopulmonary status limiting activity;Decreased knowledge of use of DME or AE      OT Treatment/Interventions: Self-care/ADL training;Therapeutic exercise;Therapeutic activities;Cognitive remediation/compensation;DME and/or AE instruction;Energy conservation;Patient/family education;Balance training    OT Goals(Current goals can be found in the care plan section) Acute Rehab OT Goals Patient Stated Goal: get stronger and go back home (niece wants pt to go to rehab to get stronger first) OT Goal Formulation: With patient/family Time For Goal Achievement: 05/04/21 Potential to Achieve Goals: Good ADL Goals Pt Will Transfer to Toilet: with modified independence;ambulating (LRAD PRN) Additional ADL Goal #1: Pt will perform all aspects of UB/LB dressing with modified independence, 2/2 opportunities. Additional ADL Goal #2: Pt will complete medication mgt/set up using pill organizer with supervision and VC from caregiver. Additional ADL Goal #3: Pt will perform all aspects of seated bathing with modified independence, 2/2 opportunities.  OT Frequency: Min 2X/week    Co-evaluation              AM-PAC OT "6 Clicks" Daily Activity     Outcome Measure Help from another person eating meals?: None Help from another  person taking care of personal grooming?: A Little Help from another person toileting, which includes using toliet, bedpan, or urinal?: A Little Help from another person bathing (including washing, rinsing, drying)?: A Little Help from another person to put on and taking off regular upper body clothing?: A Little Help from another person to put on and  taking off regular lower body clothing?: A Little 6 Click Score: 19   End of Session Equipment Utilized During Treatment: Gait belt  Activity Tolerance: Patient tolerated treatment well Patient left: in bed;with call bell/phone within reach;with bed alarm set;with family/visitor present  OT Visit Diagnosis: Other abnormalities of gait and mobility (R26.89);Muscle weakness (generalized) (M62.81);Other symptoms and signs involving cognitive function                Time: 1131-1207 OT Time Calculation (min): 36 min Charges:  OT General Charges $OT Visit: 1 Visit OT Evaluation $OT Eval Moderate Complexity: 1 Mod OT Treatments $Self Care/Home Management : 8-22 mins  Ardeth Perfect., MPH, MS, OTR/L ascom [phone] 04/20/21, 12:27 PM

## 2021-04-21 LAB — BASIC METABOLIC PANEL
Anion gap: 4 — ABNORMAL LOW (ref 5–15)
BUN: 22 mg/dL (ref 8–23)
CO2: 30 mmol/L (ref 22–32)
Calcium: 8.3 mg/dL — ABNORMAL LOW (ref 8.9–10.3)
Chloride: 108 mmol/L (ref 98–111)
Creatinine, Ser: 1.14 mg/dL (ref 0.61–1.24)
GFR, Estimated: >60 mL/min (ref >60)
Glucose, Bld: 173 mg/dL — ABNORMAL HIGH (ref 70–99)
Potassium: 3.5 mmol/L (ref 3.5–5.1)
Sodium: 142 mmol/L (ref 135–145)

## 2021-04-21 LAB — MAGNESIUM: Magnesium: 2.4 mg/dL (ref 1.7–2.4)

## 2021-04-21 LAB — GLUCOSE, CAPILLARY
Glucose-Capillary: 209 mg/dL — ABNORMAL HIGH (ref 70–99)
Glucose-Capillary: 355 mg/dL — ABNORMAL HIGH (ref 70–99)

## 2021-04-21 LAB — PHOSPHORUS: Phosphorus: 2.2 mg/dL — ABNORMAL LOW (ref 2.5–4.6)

## 2021-04-21 MED ORDER — INSULIN ASPART 100 UNIT/ML IJ SOLN
4.0000 [IU] | Freq: Three times a day (TID) | INTRAMUSCULAR | Status: DC
  Administered 2021-04-21: 12:00:00 4 [IU] via SUBCUTANEOUS
  Filled 2021-04-21: qty 1

## 2021-04-21 MED ORDER — K PHOS MONO-SOD PHOS DI & MONO 155-852-130 MG PO TABS
500.0000 mg | ORAL_TABLET | ORAL | Status: DC
  Administered 2021-04-21: 12:00:00 500 mg via ORAL
  Filled 2021-04-21 (×2): qty 2

## 2021-04-21 NOTE — TOC Initial Note (Addendum)
Transition of Care (TOC) - Initial/Assessment Note    Patient Details  Name: Allen Hunter. MRN: [id number] Date of Birth: 11/07/1940  Transition of Care [facility]) CM/SW Contact:    Eileen Stanford, LCSW Phone Number: 04/21/2021, 11:23 AM  Clinical Narrative:   CSW spoke with pt at bedside. Pt states he does not want SNF and would rather go home with home health. Pt does not have agency preference. Pt states he also does not need any dme at home. Pt states he takes himself to dr apts and believes he will continue doing tht. Pt states he sees Dr. Jenny Reichmann for primary care. Pt states he uses [facility] in [place] for medications.  CSW will reach out to [facility] agencies that accept pt's insurance.                [facility] will accept pt (RN, PT, OT, Aid).  Expected Discharge Plan: [place]     Patient Goals and CMS Choice Patient states their goals for this hospitalization and ongoing recovery are:: to go home   Choice offered to / list presented to : Patient  Expected Discharge Plan and Services Expected Discharge Plan: [place] In-house Referral: NA   Post Acute Care Choice: [place] arrangements for the past 2 months: Single Family Home                                      Prior Living Arrangements/Services Living arrangements for the past 2 months: Single Family Home Lives with:: Self Patient language and need for interpreter reviewed:: Yes Do you feel safe going back to the place where you live?: Yes      Need for Family Participation in Patient Care: Yes (Comment) Care giver support system in place?: Yes (comment)   Criminal Activity/Legal Involvement Pertinent to Current Situation/Hospitalization: No - Comment as needed  Activities of Daily Living Home Assistive Devices/Equipment: None ADL Screening (condition at time of admission) Patient's cognitive ability adequate to safely complete daily activities?: Yes Is the  patient deaf or have difficulty hearing?: No Does the patient have difficulty seeing, even when wearing glasses/contacts?: Yes Does the patient have difficulty concentrating, remembering, or making decisions?: No Patient able to express need for assistance with ADLs?: Yes Does the patient have difficulty dressing or bathing?: No Independently performs ADLs?: Yes (appropriate for developmental age) Does the patient have difficulty walking or climbing stairs?: Yes Weakness of Legs: Both Weakness of Arms/Hands: None  Permission Sought/Granted Permission sought to share information with : Family Supports    Share Information with NAME: brenda     Permission granted to share info w Relationship: sister     Emotional Assessment Appearance:: Appears stated age Attitude/Demeanor/Rapport: Engaged Affect (typically observed): Accepting Orientation: : Oriented to  Time, Oriented to Situation, Oriented to Place, Oriented to Self Alcohol / Substance Use: Not Applicable Psych Involvement: No (comment)  Admission diagnosis:  DKA (diabetic ketoacidosis) ([place]) [E11.10] Diabetic ketoacidosis with coma associated with type 2 diabetes mellitus ([place]) [E11.11] Patient Active Problem List   Diagnosis Date Noted   Acute metabolic encephalopathy 62/70/3500   Exacerbation of asthma 11/17/2020   OAB (overactive bladder) 11/17/2020   Mild cognitive impairment 08/01/2020   DKA (diabetic ketoacidosis) ([place]) 07/20/2020   Hyponatremia 07/20/2020   Dehydration 07/20/2020   Constipation 05/10/2019   Chronic diastolic CHF (congestive heart failure) ([place])  11/15/2018   DOE (dyspnea on exertion) 05/17/2018   OSA (obstructive sleep apnea) 07/19/2015   Asthma with acute exacerbation 04/10/2014   Unspecified asthma, with exacerbation 09/05/2013   GERD (gastroesophageal reflux disease) 03/01/2012   Diarrhea 03/01/2012   Hypersomnolence 01/19/2012   Prostate cancer ([place]) 01/19/2012   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    Increased prostate specific antigen (PSA) velocity 01/21/2011   Erectile dysfunction 01/20/2011   Bladder neck obstruction 01/20/2011   Encounter for well adult exam with abnormal findings 01/18/2011   Localized swelling, mass and lump, head 01/09/2010   Allergic rhinitis 10/04/2008   Asthma 10/04/2008   BACK PAIN 04/18/2008   COPD (chronic obstructive pulmonary disease) ([place]) 10/20/2007   Fatigue 10/05/2007   Diabetes ([place]) 03/09/2007   BPH (benign prostatic hyperplasia) 03/09/2007   COLONIC POLYPS, HX OF 03/09/2007   Hyperlipidemia 10/19/2006   GOUT 10/19/2006   Essential hypertension 10/19/2006   PCP:  [name], MD Pharmacy:   [place], [address] [place] [address] Phone: [phone] Fax: [id number] [address], [address] [place] [address] Phone: [phone] Fax: [phone]     Social Determinants of Health ([place]) Interventions    Readmission Risk Interventions No flowsheet data found.

## 2021-04-21 NOTE — Progress Notes (Signed)
Physical Therapy Treatment Patient Details Name: Allen Hunter. MRN: [id number] DOB: September 02, 1940 Today's Date: 04/21/2021   History of Present Illness Patient is an 81 year old male who presented to hospital for SOB with confusion. Admitted for severe DKA with AMS and severe metabolic acidosis. PMH includes asthma, back pain, COPD, DM type II, GERD, Gout, HLD, HTN, OSA, prostate cancer.    PT Comments    Pt transitions to EOB with rail and time but no assist.  Stands to RW and completes x 1 lap with min guard.  Walker to side and he completes a second lap with no AD.  Up in chair after session.  Pt comfortable with discharge along with family member in attendance.  Discussed with team.   Discharge recommendations were upgraded to [place].  Pt encouraged to use walker at home which he has for a few days as he regains strength. Voiced understanding.   Recommendations for follow up therapy are one component of a multi-disciplinary discharge planning process, led by the attending physician.  Recommendations may be updated based on patient status, additional functional criteria and insurance authorization.  Follow Up Recommendations  Home health PT     Assistance Recommended at Discharge Intermittent Supervision/Assistance  Patient can return home with the following A little help with walking and/or transfers;A little help with bathing/dressing/bathroom;Assist for transportation;Help with stairs or ramp for entrance   Equipment Recommendations  Rollator (4 wheels)    Recommendations for Other Services       Precautions / Restrictions Precautions Precautions: Fall Restrictions Weight Bearing Restrictions: No     Mobility  Bed Mobility Overal bed mobility: Needs Assistance Bed Mobility: Supine to Sit     Supine to sit: Min guard          Transfers Overall transfer level: Needs assistance Equipment used: Rolling walker (2 wheels), None Transfers: Sit to/from Stand Sit to  Stand: Supervision                Ambulation/Gait Ambulation/Gait assistance: Supervision, Min guard Gait Distance (Feet): 400 Feet Assistive device: Rollator (4 wheels), None Gait Pattern/deviations: Step-through pattern           Stairs             Wheelchair Mobility    Modified Rankin (Stroke Patients Only)       Balance Overall balance assessment: Needs assistance Sitting-balance support: Feet supported, Single extremity supported Sitting balance-Leahy Scale: Fair     Standing balance support: During functional activity, No upper extremity supported Standing balance-Leahy Scale: Fair                              Cognition   Behavior During Therapy: WFL for tasks assessed/performed Overall Cognitive Status: Within Functional Limits for tasks assessed                                          Exercises      General Comments        Pertinent Vitals/Pain Pain Assessment Pain Assessment: No/denies pain    Home Living                          Prior Function            PT Goals (current goals can now  be found in the care plan section) Progress towards PT goals: Progressing toward goals    Frequency    Min 2X/week      PT Plan Discharge plan needs to be updated    Co-evaluation              AM-PAC PT "6 Clicks" Mobility   Outcome Measure  Help needed turning from your back to your side while in a flat bed without using bedrails?: None Help needed moving from lying on your back to sitting on the side of a flat bed without using bedrails?: None Help needed moving to and from a bed to a chair (including a wheelchair)?: A Little Help needed standing up from a chair using your arms (e.g., wheelchair or bedside chair)?: A Little Help needed to walk in hospital room?: A Little Help needed climbing 3-5 steps with a railing? : A Little 6 Click Score: 20    End of Session Equipment Utilized  During Treatment: Gait belt Activity Tolerance: Patient tolerated treatment well Patient left: in chair;with call bell/phone within reach;with chair alarm set Nurse Communication: Mobility status PT Visit Diagnosis: Unsteadiness on feet (R26.81);Other abnormalities of gait and mobility (R26.89);Muscle weakness (generalized) (M62.81);Difficulty in walking, not elsewhere classified (R26.2)     Time: 0223-0236 PT Time Calculation (min) (ACUTE ONLY): 13 min  Charges:  $Gait Training: 8-22 mins                    Chesley Noon, PTA 04/21/21, 2:44 PM

## 2021-04-21 NOTE — Care Management Important Message (Signed)
Important Message  Patient Details  Name: Allen Hunter. MRN: [id number] Date of Birth: 07/19/1940   Medicare Important Message Given:  Yes  Obtained [name] signature on the initial Important Message from Medicare and will forward to Health Information Management for document to be scanned into the medical record. I wished him a speedy recovery and thanked him for his time.   Juliann Pulse A Sachiko Methot 04/21/2021, 1:50 PM

## 2021-04-21 NOTE — Discharge Summary (Signed)
Physician Discharge Summary  Allen Hunter. SVX:[id number] DOB: 14-Nov-1940 DOA: 04/18/2021  PCP: [name], MD  Admit date: 04/18/2021 Discharge date: 04/21/2021  Admitted From: Home Disposition: Home with home health  Recommendations for Outpatient Follow-up:  Follow up with PCP in 1-2 weeks   Home Health: Yes PT OT RN aide Equipment/Devices: No  Discharge Condition: Stable CODE STATUS: Full Diet recommendation: Carb modified  Brief/Interim Summary:  81 yo M presenting to [facility] ED on 04/18/21 from home with complaints of SOB that has been ongoing for a "couple of weeks". While waiting in the ED waiting room, it appears his confusion worsened. The patient's sister is bedside, providing history as the patient is still too confused for an in depth interview. 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 She has been concerned about his ability to manage his medications. However, states that this current mentation is very abnormal for him. He is usually alert & oriented, just at times forgetful, but independent with his ADL's.  She confirms that he does not use tobacco products and does not drink alcohol or use recreational substances.  She also confirms to the best of her knowledge that he does not wear oxygen at home.  Patient's been weaned from supplemental oxygen.  Sugars much improved.  Suspect medication and dietary indiscretion.  Based on recommendations for skilled nursing facility at time of discharge.  Patient adamantly refuses.  He has decision-making capability.  I spoke to his sister via phone who relates that she is unable to convince him as well and that patient will just do as he wants to do.  I explained that we cannot force him to do anything he does not want to do as he has decision-making capability.  She expressed understanding.  Difficult situation.  Will discharge home with home health services.  Despite maximal efforts patient will  remain a high risk for readmission.   Discharge Diagnoses:  Principal Problem:   DKA (diabetic ketoacidosis) ([place]) Active Problems:   Hyperlipidemia   GOUT   COPD (chronic obstructive pulmonary disease) (HCC)   BPH (benign prostatic hyperplasia)   Mild cognitive impairment   Acute metabolic encephalopathy  Diabetic ketoacidosis Type 2 diabetes mellitus, poor controlled Unclear trigger but suspect nonadherence to medications as well as dietary indiscretion Patient markedly acidotic on admission DKA protocol initiated with insulin gtt., aggressive hydration, sodium bicarb infusion Anion gap closed, clinical status improved Transition to subcutaneous regimen Diabetes coordinator consulted Plan: Appreciate diabetes coordinator assistance.  Discharge home.  Educate patient on importance of medication and dietary adherence.  Home insulin regimen has been resumed.   Acute metabolic encephalopathy Appears improved CT head negative Likely in setting of DKA Continue outpatient donepezil Mental status baseline at time of discharge   Acute kidney injury Metabolic acidosis Suspect secondary to DKA Kidney function improving Acidosis resolved   COPD No evidence of acute exacerbation Submental oxygen as necessary Bronchodilators   Hyperlipidemia Hypertension PTA losartan and bisoprolol currently on hold Care started on discharge PTA statin   BPH PTA darifenacin and tamsulosin   History of gout Currently allopurinol on hold Can restart on discharge  Discharge Instructions  Discharge Instructions     Diet - low sodium heart healthy   Complete by: As directed    Increase activity slowly   Complete by: As directed       Allergies as of 04/21/2021   No Known Allergies  Medication List     STOP taking these medications    empagliflozin 25 MG Tabs tablet Commonly known as: Jardiance       TAKE these medications    allopurinol 300 MG tablet Commonly  known as: ZYLOPRIM Take 1 tablet (300 mg total) by mouth daily.   aspirin 81 MG tablet Take 81 mg by mouth daily.   atorvastatin 80 MG tablet Commonly known as: LIPITOR Take 1 tablet (80 mg total) by mouth daily.   bisoprolol 5 MG tablet Commonly known as: ZEBETA Take 1 tablet (5 mg total) by mouth daily.   blood glucose meter kit and supplies Dispense based on patient and insurance preference. Use up to four times daily as directed. (FOR ICD-10 E10.9, E11.9).   CENTRUM MEN PO Take 1 tablet by mouth daily.   donepezil 10 MG tablet Commonly known as: ARICEPT Take half tablet (5 mg) daily for 2 weeks, then increase to the full tablet at 10 mg daily   esomeprazole 40 MG capsule Commonly known as: NEXIUM Take 1 capsule (40 mg total) by mouth daily.   Fish Oil 500 MG Caps Take 1 capsule by mouth daily.   FREESTYLE LITE test strip Generic drug: glucose blood USE 1 STRIP TWICE A DAY AS NEEDED TO CHECK SUGAR   glipiZIDE 5 MG tablet Commonly known as: GLUCOTROL TAKE ONE-HALF TABLET BY MOUTH TWO TIMES A DAY FOR DIABETES. TAKE 30 MINUTES BEFORE MEAL(S)   insulin glargine 100 UNIT/ML Solostar Pen Commonly known as: LANTUS Inject 30 Units into the skin daily.   Lancets Misc Use asd 1 per day 250.02   losartan 25 MG tablet Commonly known as: COZAAR Take 1 tablet by mouth daily.   metFORMIN 1000 MG tablet Commonly known as: GLUCOPHAGE Take 1 tablet (1,000 mg total) by mouth 2 (two) times daily with a meal.   montelukast 10 MG tablet Commonly known as: SINGULAIR Take 1 tablet (10 mg total) by mouth at bedtime.   solifenacin 5 MG tablet Commonly known as: VESICARE Take 1 tablet (5 mg total) by mouth daily.   tamsulosin 0.4 MG Caps capsule Commonly known as: Flomax Take 1 capsule (0.4 mg total) by mouth daily.   Trelegy Ellipta 100-62.5-25 MCG/ACT Aepb Generic drug: Fluticasone-Umeclidin-Vilant Inhale 1 puff into the lungs daily.        No Known  Allergies  Consultations: None   Procedures/Studies: DG Chest 2 View  Result Date: 04/18/2021 CLINICAL DATA:  Short of breath and cough for 1 week EXAM: CHEST - 2 VIEW COMPARISON:  07/20/2020 FINDINGS: Frontal and lateral views of the chest demonstrate an unremarkable cardiac silhouette. No airspace disease, effusion, or pneumothorax. No acute bony abnormality. IMPRESSION: 1. No acute intrathoracic process. Electronically Signed   By: Randa Ngo M.D.   On: 04/18/2021 17:31   CT HEAD WO CONTRAST (5MM)  Result Date: 04/18/2021 CLINICAL DATA:  Altered level of consciousness, shortness of breath and cough EXAM: CT HEAD WITHOUT CONTRAST TECHNIQUE: Contiguous axial images were obtained from the base of the skull through the vertex without intravenous contrast. RADIATION DOSE REDUCTION: This exam was performed according to the departmental dose-optimization program which includes automated exposure control, adjustment of the mA and/or kV according to patient size and/or use of iterative reconstruction technique. COMPARISON:  07/20/2020, 09/24/2020 FINDINGS: Brain: No acute infarct or hemorrhage. Stable cerebral atrophy. Lateral ventricles and midline structures are unremarkable. No acute extra-axial fluid collections. No mass effect. Vascular: No hyperdense vessel or unexpected calcification. Skull: Normal. Negative  for fracture or focal lesion. Stable sebaceous cyst right parietal scalp. Sinuses/Orbits: No acute finding. Other: None. IMPRESSION: 1. Stable head CT, no acute intracranial process. Electronically Signed   By: Randa Ngo M.D.   On: 04/18/2021 23:23      Subjective: Seen and examined the day of discharge.  Stable no distress.  Stable for discharge home  Discharge Exam: Vitals:   04/21/21 0837 04/21/21 1139  BP: (!) 113/97 114/71  Pulse: 71 73  Resp: 15 18  Temp: 98.2 F (36.8 C) 98.5 F (36.9 C)  SpO2: 96% 97%   Vitals:   04/21/21 0748 04/21/21 0815 04/21/21 0837 04/21/21  1139  BP: 113/74  (!) 113/97 114/71  Pulse: 65  71 73  Resp: '20  15 18  ' Temp: 97.7 F (36.5 C)  98.2 F (36.8 C) 98.5 F (36.9 C)  TempSrc:   Oral   SpO2: 97% 94% 96% 97%  Weight:      Height:        General: Pt is alert, awake, not in acute distress Cardiovascular: RRR, S1/S2 +, no rubs, no gallops Respiratory: CTA bilaterally, no wheezing, no rhonchi Abdominal: Soft, NT, ND, bowel sounds + Extremities: no edema, no cyanosis    The results of significant diagnostics from this hospitalization (including imaging, microbiology, ancillary and laboratory) are listed below for reference.     Microbiology: Recent Results (from the past 240 hour(s))  Resp Panel by RT-PCR (Flu A&B, Covid) Nasopharyngeal Swab     Status: None   Collection Time: 04/18/21 12:35 AM   Specimen: Nasopharyngeal Swab; Nasopharyngeal(NP) swabs in vial transport medium  Result Value Ref Range Status   SARS Coronavirus 2 by RT PCR NEGATIVE NEGATIVE Final    Comment: (NOTE) SARS-CoV-2 target nucleic acids are NOT DETECTED.  The SARS-CoV-2 RNA is generally detectable in upper respiratory specimens during the acute phase of infection. The lowest concentration of SARS-CoV-2 viral copies this assay can detect is 138 copies/mL. A negative result does not preclude SARS-Cov-2 infection and should not be used as the sole basis for treatment or other patient management decisions. A negative result may occur with  improper specimen collection/handling, submission of specimen other than nasopharyngeal swab, presence of viral mutation(s) within the areas targeted by this assay, and inadequate number of viral copies(<138 copies/mL). A negative result must be combined with clinical observations, patient history, and epidemiological information. The expected result is Negative.  Fact Sheet for Patients:  EntrepreneurPulse.com.au  Fact Sheet for Healthcare Providers:   IncredibleEmployment.be  This test is no t yet approved or cleared by the [place] FDA and  has been authorized for detection and/or diagnosis of SARS-CoV-2 by FDA under an Emergency Use Authorization (EUA). This EUA will remain  in effect (meaning this test can be used) for the duration of the COVID-19 declaration under Section 564(b)(1) of the Act, 21 U.S.C.section 360bbb-3(b)(1), unless the authorization is terminated  or revoked sooner.       Influenza A by PCR NEGATIVE NEGATIVE Final   Influenza B by PCR NEGATIVE NEGATIVE Final    Comment: (NOTE) The Xpert Xpress SARS-CoV-2/FLU/RSV plus assay is intended as an aid in the diagnosis of influenza from Nasopharyngeal swab specimens and should not be used as a sole basis for treatment. Nasal washings and aspirates are unacceptable for Xpert Xpress SARS-CoV-2/FLU/RSV testing.  Fact Sheet for Patients: EntrepreneurPulse.com.au  Fact Sheet for Healthcare Providers: IncredibleEmployment.be  This test is not yet approved or cleared by the [place] FDA  and has been authorized for detection and/or diagnosis of SARS-CoV-2 by FDA under an Emergency Use Authorization (EUA). This EUA will remain in effect (meaning this test can be used) for the duration of the COVID-19 declaration under Section 564(b)(1) of the Act, 21 U.S.C. section 360bbb-3(b)(1), unless the authorization is terminated or revoked.  Performed at [facility], [address] [place], [place] [address]      Labs: BNP (last 3 results) No results for input(s): BNP in the last 8760 hours. Basic Metabolic Panel: Recent Labs  Lab 04/18/21 2117 04/19/21 0239 04/19/21 0501 04/19/21 0654 04/19/21 1319 04/20/21 0722 04/21/21 0632  NA  --    < > 143 143 143 143 142  K  --    < > 3.7 4.2 5.1 3.5 3.5  CL  --    < > 109 104 111 109 108  CO2  --    < > 19* 21* 19* 29 30  GLUCOSE  --    < > 157*  210* 187* 106* 173*  BUN  --    < > 40* 42* 35* 28* 22  CREATININE  --    < > 1.91* 2.10* 1.59* 1.17 1.14  CALCIUM  --    < > 8.5* 8.6* 7.6* 8.5* 8.3*  MG 2.9*  --  2.1  --   --  2.2 2.4  PHOS 11.5*  --  2.4* 2.8  --  1.5* 2.2*   < > = values in this interval not displayed.   Liver Function Tests: Recent Labs  Lab 04/18/21 2117  AST 19  ALT 23  ALKPHOS 71  BILITOT 1.0  PROT 6.9  ALBUMIN 3.8   No results for input(s): LIPASE, AMYLASE in the last 168 hours. No results for input(s): AMMONIA in the last 168 hours. CBC: Recent Labs  Lab 04/18/21 1700 04/19/21 0501 04/20/21 0722  WBC 10.4 9.6 6.8  HGB 15.1 12.5* 12.7*  HCT 50.9 39.5 38.7*  MCV 100.6* 95.6 90.8  PLT 271 179 155   Cardiac Enzymes: No results for input(s): CKTOTAL, CKMB, CKMBINDEX, TROPONINI in the last 168 hours. BNP: Invalid input(s): POCBNP CBG: Recent Labs  Lab 04/20/21 1926 04/20/21 2116 04/20/21 2332 04/21/21 0824 04/21/21 1221  GLUCAP 347* 297* 267* 209* 355*   D-Dimer No results for input(s): DDIMER in the last 72 hours. Hgb A1c No results for input(s): HGBA1C in the last 72 hours. Lipid Profile No results for input(s): CHOL, HDL, LDLCALC, TRIG, CHOLHDL, LDLDIRECT in the last 72 hours. Thyroid function studies No results for input(s): TSH, T4TOTAL, T3FREE, THYROIDAB in the last 72 hours.  Invalid input(s): FREET3 Anemia work up No results for input(s): VITAMINB12, FOLATE, FERRITIN, TIBC, IRON, RETICCTPCT in the last 72 hours. Urinalysis    Component Value Date/Time   COLORURINE YELLOW 04/19/2021 0240   APPEARANCEUR CLEAR (A) 04/19/2021 0240   LABSPEC 1.020 04/19/2021 0240   PHURINE 5.0 04/19/2021 0240   GLUCOSEU >1,000 (A) 04/19/2021 0240   GLUCOSEU >=1000 (A) 04/08/2021 1459   HGBUR TRACE (A) 04/19/2021 0240   BILIRUBINUR MODERATE (A) 04/19/2021 0240   KETONESUR >160 (A) 04/19/2021 0240   PROTEINUR 100 (A) 04/19/2021 0240   UROBILINOGEN 0.2 04/08/2021 1459   NITRITE NEGATIVE  04/19/2021 0240   LEUKOCYTESUR NEGATIVE 04/19/2021 0240   Sepsis Labs Invalid input(s): PROCALCITONIN,  WBC,  LACTICIDVEN Microbiology Recent Results (from the past 240 hour(s))  Resp Panel by RT-PCR (Flu A&B, Covid) Nasopharyngeal Swab     Status: None  Collection Time: 04/18/21 12:35 AM   Specimen: Nasopharyngeal Swab; Nasopharyngeal(NP) swabs in vial transport medium  Result Value Ref Range Status   SARS Coronavirus 2 by RT PCR NEGATIVE NEGATIVE Final    Comment: (NOTE) SARS-CoV-2 target nucleic acids are NOT DETECTED.  The SARS-CoV-2 RNA is generally detectable in upper respiratory specimens during the acute phase of infection. The lowest concentration of SARS-CoV-2 viral copies this assay can detect is 138 copies/mL. A negative result does not preclude SARS-Cov-2 infection and should not be used as the sole basis for treatment or other patient management decisions. A negative result may occur with  improper specimen collection/handling, submission of specimen other than nasopharyngeal swab, presence of viral mutation(s) within the areas targeted by this assay, and inadequate number of viral copies(<138 copies/mL). A negative result must be combined with clinical observations, patient history, and epidemiological information. The expected result is Negative.  Fact Sheet for Patients:  EntrepreneurPulse.com.au  Fact Sheet for Healthcare Providers:  IncredibleEmployment.be  This test is no t yet approved or cleared by the [place] FDA and  has been authorized for detection and/or diagnosis of SARS-CoV-2 by FDA under an Emergency Use Authorization (EUA). This EUA will remain  in effect (meaning this test can be used) for the duration of the COVID-19 declaration under Section 564(b)(1) of the Act, 21 U.S.C.section 360bbb-3(b)(1), unless the authorization is terminated  or revoked sooner.       Influenza A by PCR NEGATIVE NEGATIVE  Final   Influenza B by PCR NEGATIVE NEGATIVE Final    Comment: (NOTE) The Xpert Xpress SARS-CoV-2/FLU/RSV plus assay is intended as an aid in the diagnosis of influenza from Nasopharyngeal swab specimens and should not be used as a sole basis for treatment. Nasal washings and aspirates are unacceptable for Xpert Xpress SARS-CoV-2/FLU/RSV testing.  Fact Sheet for Patients: EntrepreneurPulse.com.au  Fact Sheet for Healthcare Providers: IncredibleEmployment.be  This test is not yet approved or cleared by the [place] FDA and has been authorized for detection and/or diagnosis of SARS-CoV-2 by FDA under an Emergency Use Authorization (EUA). This EUA will remain in effect (meaning this test can be used) for the duration of the COVID-19 declaration under Section 564(b)(1) of the Act, 21 U.S.C. section 360bbb-3(b)(1), unless the authorization is terminated or revoked.  Performed at [facility], [address] [place], [place] [address]      Time coordinating discharge: Over 30 minutes  SIGNED:   Sidney Ace, MD  [facility] 04/21/2021, 1:27 PM Pager   If 7PM-7AM, please contact night-coverage

## 2021-04-21 NOTE — Consult Note (Signed)
PHARMACY CONSULT NOTE - FOLLOW UP  Pharmacy Consult for Electrolyte Monitoring and Replacement   Recent Labs: Potassium (mmol/L)  Date Value  04/20/2021 3.5   Magnesium (mg/dL)  Date Value  04/21/2021 2.4   Calcium (mg/dL)  Date Value  04/20/2021 8.5 (L)   Albumin (g/dL)  Date Value  04/18/2021 3.8   Phosphorus (mg/dL)  Date Value  04/21/2021 2.2 (L)   Sodium (mmol/L)  Date Value  04/20/2021 143     Assessment:  81 yo M presenting to [facility] ED on 04/18/21 from home with complaints of SOB that has been ongoing for a "couple of weeks". Insulin gtt d/c'ed.   Goal of Therapy:  WNL   Plan:  Give 2 Potassium Phosphate Neutral tablets every 4 hours x 2 doses   Darrick Penna ,PharmD Clinical Pharmacist 04/21/2021 9:27 AM

## 2021-04-21 NOTE — Progress Notes (Addendum)
Inpatient Diabetes Program Recommendations  AACE/ADA: New Consensus Statement on Inpatient Glycemic Control (2015)  Target Ranges:  Prepandial:   less than 140 mg/dL      Peak postprandial:   less than 180 mg/dL (1-2 hours)      Critically ill patients:  140 - 180 mg/dL    Latest Reference Range & Units 04/18/21 21:03  Glucose 70 - 99 mg/dL 546 (HH)  (HH): Data is critically high  Latest Reference Range & Units 04/08/21 14:59  Hemoglobin A1C 4.6 - 6.5 % 8.7 (H)  (203 mg/dl)  (H): Data is abnormally high  Latest Reference Range & Units 04/20/21 01:02 04/20/21 04:02 04/20/21 07:49 04/20/21 11:55 04/20/21 16:59 04/20/21 19:26 04/20/21 21:16  Glucose-Capillary 70 - 99 mg/dL 155 (H)  2 units Novolog  168 (H)  4 units Novolog  104 (H) 249 (H)  3 units Novolog  20 units Semglee _0   219 (H)  3 units Novolog  347 (H) 297 (H)  3 units Novolog     Latest Reference Range & Units 04/21/21 08:24  Glucose-Capillary 70 - 99 mg/dL 209 (H)  (H): Data is abnormally high   Admit with: DKA  History: DM2  Home DM Meds: Jardiance 25 mg daily     Glucotrol 5 mg take 1/2 tablet 30 minutes before meals bid      Lantus 30 units daily      Metformin 1000 mg bid  Current Orders: Novolog Sensitive Correction Scale/ SSI (0-9 units) TID AC + HS      Semglee 20 units daily   MD- Please consider starting Novolog Meal Coverage: Novolog 4 units TID with meals  Please also consider increasing the Semglee to 25 units Daily  May consider stopping Jardiance from home meds since pt admitted with DKA Note that patient was on Jardiance 25 mg daily which can increase risk for DKA in some patients so may not want to resume at discharge?     Addendum 11am--I met with pt this AM to discuss admission diagnosis DKA, home meds, etc.  Pt verified all home DM meds and stated to me that he got confused at home about the insulin (to be exact, he stated he was confused about the time of day to take and  the amount).  Stated to me he has 2 different MD[facility] ([facility] and [facility] Reichmann with [facility]) and stated sometimes they tell him different things about his meds and he gets confused.  Asked me to please clarify how much and when to take the insulin when he goes home.  Stated to me he has no problems giving himself insulin, just doesn't know how much to give.  We verbally reviewed using insulin pen at home and pt was able to tell me how to correctly use the pen.  Has CBG meter at home but unsure if he is checking in a regular basis. We talked about how his A1c was markedly improved (13.4% in May 2022 down to 8.7% current)--Pt stated, ""I must be doing something right".  Did not admit to having any Hypoglycemia at home, but again, I am unsure of pt checking CBGs on regular basis.   It appears patient saw PCP, Dr. Jenny Reichmann on 1/17 and there was some concern regarding medication adherence and possibly medication confusion?  Per PCP: "Pt has been confused trying to monitor his own med use and states only taking 20 units lantus per day, instead of last recommended 30 units per day"    --  Will follow patient during hospitalization--  [name] RN, MSN, CDE Diabetes Coordinator Inpatient Glycemic Control Team Team Pager: [phone] (8a-5p)

## 2021-04-21 NOTE — Progress Notes (Signed)
Patient discharged home. IV removed. Went over discharge instructions with patient and the patients sister [name]. Both stated that they understood and all questions answered. Patient going home POV with sister.

## 2021-04-22 ENCOUNTER — Other Ambulatory Visit: Payer: Self-pay

## 2021-04-22 ENCOUNTER — Telehealth: Payer: Self-pay | Admitting: Internal Medicine

## 2021-04-22 NOTE — Telephone Encounter (Signed)
Patient states he needs help "organizing" his medication  Pt states he has multiple medications which he needs help remembering when and what to take  Inquired if pt needs an hh aide, pt stated yes  Pt requesting a c/b

## 2021-04-22 NOTE — Patient Outreach (Signed)
[facility]) Care Management  04/22/2021  Allen Hunter. [birth year] [id number]   Case Closure    Case has been transferred to [place] program and patient to be followed by assigned RN CM at PCP office.    Plan: RN CM will close case.    Enzo Montgomery, RN,BSN,CCM [place] Management Telephonic Care Management Coordinator Direct Phone: [phone] Toll Free: [phone] Fax: [phone]

## 2021-04-23 ENCOUNTER — Telehealth: Payer: Self-pay

## 2021-04-23 MED ORDER — LOSARTAN POTASSIUM 25 MG PO TABS: 25.0000 mg | ORAL_TABLET | Freq: Every day | ORAL | 3 refills | Status: AC

## 2021-04-23 NOTE — Telephone Encounter (Addendum)
Transition Care Management Unsuccessful Follow-up Telephone Call  Date of discharge and from where:  TCM DC [facility] 04-21-21 Dx: DKA  Attempts:  1st Attempt  Reason for unsuccessful TCM follow-up call:  Unable to leave message  Transition Care Management Unsuccessful Follow-up Telephone Call  Date of discharge and from where:  TCM DC [facility] 04-21-21 Dx: DKA  Attempts:  2nd Attempt  Reason for unsuccessful TCM follow-up call:  Unable to leave message  Transition Care Management Follow-up Telephone [place] 04-21-21 Dx: DKA Date of discharge and from where:  How have you been since you were released from the hospital? Feeling fairly ok  Any questions or concerns? No  Items Reviewed: Did the pt receive and understand the discharge instructions provided? Yes  Medications obtained and verified? Yes  Other? No  Any new allergies since your discharge? No  Dietary orders reviewed? Yes Do you have support at home? Yes   Home Care and Equipment/Supplies: Were home health services ordered? Yes- PT/OT/ Aide/ RN If so, what is the name of the agency? [facility]   Has the agency set up a time to come to the patient's home? yes Were any new equipment or medical supplies ordered?  No What is the name of the medical supply agency? na Were you able to get the supplies/equipment? not applicable Do you have any questions related to the use of the equipment or supplies? No  Functional Questionnaire: (I = Independent and D = Dependent) ADLs: I  Bathing/Dressing- I  Meal Prep- I  Eating- I  Maintaining continence- I  Transferring/Ambulation- I  Managing Meds- I  Follow up appointments reviewed:  PCP Hospital f/u appt confirmed? Yes  Scheduled to see Dr Jenny Reichmann on 04-29-21 @ [facility] f/u appt confirmed? No . Are transportation arrangements needed? No  If their condition worsens, is the pt aware to call PCP or go to the Emergency Dept.? Yes Was the patient provided with  contact information for the PCP's office or ED? Yes Was to pt encouraged to call back with questions or concerns? Yes

## 2021-04-23 NOTE — Telephone Encounter (Signed)
[facility]  Too soon for trelegy since [facility] 1 [facility]

## 2021-04-23 NOTE — Telephone Encounter (Signed)
Spoke with patient regarding med list and what he should be taking. Patient states that he needs a refill of Losartan 25mg  and Trelligy-Ellipta. Patient just got out of the hospital and has had labs done there. Please review and advise if patient needs refill of Losartan.

## 2021-04-24 ENCOUNTER — Telehealth: Payer: Self-pay

## 2021-04-24 ENCOUNTER — Telehealth: Payer: Self-pay | Admitting: Internal Medicine

## 2021-04-24 DIAGNOSIS — E1165 Type 2 diabetes mellitus with hyperglycemia: Secondary | ICD-10-CM

## 2021-04-24 DIAGNOSIS — J449 Chronic obstructive pulmonary disease, unspecified: Secondary | ICD-10-CM

## 2021-04-24 DIAGNOSIS — I5032 Chronic diastolic (congestive) heart failure: Secondary | ICD-10-CM

## 2021-04-24 DIAGNOSIS — C61 Malignant neoplasm of prostate: Secondary | ICD-10-CM

## 2021-04-24 DIAGNOSIS — G3184 Mild cognitive impairment, so stated: Secondary | ICD-10-CM

## 2021-04-24 NOTE — Telephone Encounter (Signed)
Allen Hunter is calling requesting verbal orders for Skilled Nursing.  Frequency: Once a week for one week Twice a week for 2 weeks Once a week for 6 weeks And one PRN visit  CB [phone]

## 2021-04-24 NOTE — Telephone Encounter (Signed)
Unable to reach patient and voicemail not set up

## 2021-04-24 NOTE — Telephone Encounter (Signed)
-----   Message from Osvaldo Shipper, [place] sent at 04/24/2021  1:03 PM EST ----- Regarding: CCM Services Your patient was recently discharged from the hospital and is eligible for CCM services.  To refer, please order via: BPA (Best Practice Advisory) in your patient's EMR OR  [phone] referral order    Thank you,  [place]: [phone]

## 2021-04-24 NOTE — Telephone Encounter (Signed)
[facility] do not [facility] rx for Trelligy-Ellipta, informed pt 1 yr supply was sent to pharmacy on 11-14-2020  Pt requesting a c/b

## 2021-04-25 ENCOUNTER — Telehealth: Payer: Self-pay | Admitting: Internal Medicine

## 2021-04-25 NOTE — Telephone Encounter (Signed)
Please contact pt again, as he returned call to discuss this matter.

## 2021-04-25 NOTE — Telephone Encounter (Signed)
Please refill as per office routine med refill policy (all routine meds to be refilled for 3 mo or monthly (per pt preference) up to one year from last visit, then month to month grace period for 3 mo, then further med refills will have to be denied) ? ?

## 2021-04-25 NOTE — Telephone Encounter (Signed)
Kelly with [facility] calls in regards to getting PT Physical Therapy orders (Once a week for 8 weeks).  Claiborne Billings would also like to state that PT's blood sugar was reading as HIGH, but was informed by PT that he is taking his medication.  CB for orders: [phone]

## 2021-04-25 NOTE — Telephone Encounter (Signed)
Spoke with patient regarding medication.

## 2021-04-25 NOTE — Telephone Encounter (Signed)
Unable to reach patient or leave a voice mail.  

## 2021-04-25 NOTE — Telephone Encounter (Signed)
Ok for verbals 

## 2021-04-25 NOTE — Telephone Encounter (Signed)
Verbals given; patient is going to the ED at the [facility] hospital

## 2021-04-25 NOTE — Telephone Encounter (Signed)
Verbals left on voicemail for skilled nursing

## 2021-04-25 NOTE — Telephone Encounter (Signed)
Ok for verbal  Pt should return to ED to r/o DKA as was just dc from hospital jan 30 after this episode that was severe and likely due to not taking his medicaitons

## 2021-04-29 ENCOUNTER — Inpatient Hospital Stay: Payer: Medicare HMO | Admitting: Internal Medicine

## 2021-04-29 ENCOUNTER — Telehealth: Payer: Self-pay | Admitting: *Deleted

## 2021-04-29 NOTE — Chronic Care Management (AMB) (Signed)
°  Chronic Care Management   Outreach Note  04/29/2021 Name: Allen Hunter. MRN: [id number] DOB: October 21, 1940  Allen Hunter. is a 81 y.o. year old male who is a primary care patient of [name], MD. I reached out to [name] by phone today in response to a referral sent by Mr. Allen Muse Jr.'s primary care provider.  A telephone outreach was attempted today. The patient was referred to the case management team for assistance with care management and care coordination.   Follow Up Plan: The care management team will reach out to the patient again over the next 7 days.  If patient returns call to provider office, please advise to call [place]* at [place] Management  Direct Dial: [phone]

## 2021-04-30 NOTE — Chronic Care Management (AMB) (Signed)
Chronic Care Management   Note  04/30/2021 Name: Allen Hunter. MRN: [id number] DOB: 1940-05-21  Allen Hunter. is a 81 y.o. year old male who is a primary care patient of [name], MD. I reached out to [name] by phone today in response to a referral sent by [name]'s PCP.  [name] was given information about Chronic Care Management services today including:  CCM service includes personalized support from designated clinical staff supervised by his physician, including individualized plan of care and coordination with other care providers 24/7 contact phone numbers for assistance for urgent and routine care needs. Service will only be billed when office clinical staff spend 20 minutes or more in a month to coordinate care. Only one practitioner may furnish and bill the service in a calendar month. The patient may stop CCM services at any time (effective at the end of the month) by phone call to the office staff. The patient is responsible for co-pay (up to 20% after annual deductible is met) if co-pay is required by the individual health plan.   Patient agreed to services and verbal consent obtained.   Follow up plan: Telephone appointment with care management team member scheduled for:05/07/21  [place] Management  Direct Dial: [phone]

## 2021-05-02 ENCOUNTER — Telehealth: Payer: Self-pay | Admitting: Internal Medicine

## 2021-05-02 NOTE — Telephone Encounter (Signed)
Shawn w/ [facility] informing provider pt declined physical therapy for today 05-02-2021 and appt will be considered a missed visit

## 2021-05-05 ENCOUNTER — Other Ambulatory Visit: Payer: Self-pay | Admitting: Internal Medicine

## 2021-05-05 NOTE — Telephone Encounter (Signed)
Please refill as per office routine med refill policy (all routine meds to be refilled for 3 mo or monthly (per pt preference) up to one year from last visit, then month to month grace period for 3 mo, then further med refills will have to be denied) ? ?

## 2021-05-07 ENCOUNTER — Encounter: Payer: Self-pay | Admitting: Internal Medicine

## 2021-05-07 ENCOUNTER — Other Ambulatory Visit: Payer: Self-pay

## 2021-05-07 ENCOUNTER — Telehealth: Payer: Medicare HMO

## 2021-05-07 ENCOUNTER — Inpatient Hospital Stay
Admission: EM | Admit: 2021-05-07 | Discharge: 2021-05-09 | DRG: 638 | Disposition: A | Discharge status: Home / Self Care | Payer: Medicare HMO | Attending: Internal Medicine | Admitting: Internal Medicine

## 2021-05-07 DIAGNOSIS — Z8546 Personal history of malignant neoplasm of prostate: Secondary | ICD-10-CM | POA: Diagnosis not present

## 2021-05-07 DIAGNOSIS — E785 Hyperlipidemia, unspecified: Secondary | ICD-10-CM | POA: Diagnosis present

## 2021-05-07 DIAGNOSIS — G3184 Mild cognitive impairment, so stated: Secondary | ICD-10-CM

## 2021-05-07 DIAGNOSIS — Z7982 Long term (current) use of aspirin: Secondary | ICD-10-CM | POA: Diagnosis not present

## 2021-05-07 DIAGNOSIS — Z79899 Other long term (current) drug therapy: Secondary | ICD-10-CM

## 2021-05-07 DIAGNOSIS — Z87891 Personal history of nicotine dependence: Secondary | ICD-10-CM | POA: Diagnosis not present

## 2021-05-07 DIAGNOSIS — N400 Enlarged prostate without lower urinary tract symptoms: Secondary | ICD-10-CM | POA: Diagnosis not present

## 2021-05-07 DIAGNOSIS — Z20822 Contact with and (suspected) exposure to covid-19: Secondary | ICD-10-CM | POA: Diagnosis not present

## 2021-05-07 DIAGNOSIS — T383X6A Underdosing of insulin and oral hypoglycemic [antidiabetic] drugs, initial encounter: Secondary | ICD-10-CM | POA: Diagnosis present

## 2021-05-07 DIAGNOSIS — Z8249 Family history of ischemic heart disease and other diseases of the circulatory system: Secondary | ICD-10-CM

## 2021-05-07 DIAGNOSIS — K219 Gastro-esophageal reflux disease without esophagitis: Secondary | ICD-10-CM | POA: Diagnosis present

## 2021-05-07 DIAGNOSIS — J309 Allergic rhinitis, unspecified: Secondary | ICD-10-CM | POA: Diagnosis present

## 2021-05-07 DIAGNOSIS — I10 Essential (primary) hypertension: Secondary | ICD-10-CM | POA: Diagnosis present

## 2021-05-07 DIAGNOSIS — G4733 Obstructive sleep apnea (adult) (pediatric): Secondary | ICD-10-CM | POA: Diagnosis present

## 2021-05-07 DIAGNOSIS — M109 Gout, unspecified: Secondary | ICD-10-CM | POA: Diagnosis not present

## 2021-05-07 DIAGNOSIS — E1110 Type 2 diabetes mellitus with ketoacidosis without coma: Secondary | ICD-10-CM | POA: Diagnosis not present

## 2021-05-07 DIAGNOSIS — Z9049 Acquired absence of other specified parts of digestive tract: Secondary | ICD-10-CM | POA: Diagnosis not present

## 2021-05-07 DIAGNOSIS — Z86010 Personal history of colonic polyps: Secondary | ICD-10-CM

## 2021-05-07 DIAGNOSIS — R739 Hyperglycemia, unspecified: Secondary | ICD-10-CM | POA: Diagnosis not present

## 2021-05-07 DIAGNOSIS — J449 Chronic obstructive pulmonary disease, unspecified: Secondary | ICD-10-CM | POA: Diagnosis not present

## 2021-05-07 DIAGNOSIS — Z9114 Patient's other noncompliance with medication regimen: Secondary | ICD-10-CM

## 2021-05-07 DIAGNOSIS — E0810 Diabetes mellitus due to underlying condition with ketoacidosis without coma: Secondary | ICD-10-CM | POA: Diagnosis not present

## 2021-05-07 DIAGNOSIS — E1165 Type 2 diabetes mellitus with hyperglycemia: Secondary | ICD-10-CM | POA: Diagnosis not present

## 2021-05-07 DIAGNOSIS — N179 Acute kidney failure, unspecified: Secondary | ICD-10-CM | POA: Diagnosis present

## 2021-05-07 DIAGNOSIS — E875 Hyperkalemia: Secondary | ICD-10-CM | POA: Diagnosis not present

## 2021-05-07 DIAGNOSIS — E119 Type 2 diabetes mellitus without complications: Secondary | ICD-10-CM

## 2021-05-07 DIAGNOSIS — R001 Bradycardia, unspecified: Secondary | ICD-10-CM | POA: Diagnosis not present

## 2021-05-07 DIAGNOSIS — Z7984 Long term (current) use of oral hypoglycemic drugs: Secondary | ICD-10-CM

## 2021-05-07 DIAGNOSIS — Z794 Long term (current) use of insulin: Secondary | ICD-10-CM

## 2021-05-07 HISTORY — DX: Neoplasm of uncertain behavior of left kidney: D41.02

## 2021-05-07 LAB — URINALYSIS, ROUTINE W REFLEX MICROSCOPIC
Bacteria, UA: NONE SEEN
Bilirubin Urine: NEGATIVE
Glucose, UA: >=500 mg/dL — AB
Hgb urine dipstick: NEGATIVE
Ketones, ur: 80 mg/dL — AB
Leukocytes,Ua: NEGATIVE
Nitrite: NEGATIVE
Protein, ur: NEGATIVE mg/dL
Specific Gravity, Urine: 1.022 (ref 1.005–1.030)
Squamous Epithelial / HPF: NONE SEEN (ref 0–5)
pH: 5 (ref 5.0–8.0)

## 2021-05-07 LAB — BASIC METABOLIC PANEL WITH GFR
Anion gap: 9 (ref 5–15)
BUN: 23 mg/dL (ref 8–23)
CO2: 20 mmol/L — ABNORMAL LOW (ref 22–32)
Calcium: 9.1 mg/dL (ref 8.9–10.3)
Chloride: 107 mmol/L (ref 98–111)
Creatinine, Ser: 1.54 mg/dL — ABNORMAL HIGH (ref 0.61–1.24)
GFR, Estimated: 45 mL/min — ABNORMAL LOW
Glucose, Bld: 196 mg/dL — ABNORMAL HIGH (ref 70–99)
Potassium: 4.2 mmol/L (ref 3.5–5.1)
Sodium: 136 mmol/L (ref 135–145)

## 2021-05-07 LAB — CBC WITH DIFFERENTIAL/PLATELET
Abs Immature Granulocytes: 0.02 10*3/uL (ref 0.00–0.07)
Basophils Absolute: 0.1 10*3/uL (ref 0.0–0.1)
Basophils Relative: 1 %
Eosinophils Absolute: 0.1 10*3/uL (ref 0.0–0.5)
Eosinophils Relative: 2 %
HCT: 44.4 % (ref 39.0–52.0)
Hemoglobin: 13.5 g/dL (ref 13.0–17.0)
Immature Granulocytes: 1 %
Lymphocytes Relative: 26 %
Lymphs Abs: 1.1 10*3/uL (ref 0.7–4.0)
MCH: 29.7 pg (ref 26.0–34.0)
MCHC: 30.4 g/dL (ref 30.0–36.0)
MCV: 97.8 fL (ref 80.0–100.0)
Monocytes Absolute: 0.3 10*3/uL (ref 0.1–1.0)
Monocytes Relative: 7 %
Neutro Abs: 2.7 10*3/uL (ref 1.7–7.7)
Neutrophils Relative %: 63 %
Platelets: 240 10*3/uL (ref 150–400)
RBC: 4.54 MIL/uL (ref 4.22–5.81)
RDW: 17.6 % — ABNORMAL HIGH (ref 11.5–15.5)
WBC: 4.1 10*3/uL (ref 4.0–10.5)
nRBC: 0 % (ref 0.0–0.2)

## 2021-05-07 LAB — MAGNESIUM: Magnesium: 1.7 mg/dL (ref 1.7–2.4)

## 2021-05-07 LAB — BLOOD GAS, VENOUS
Acid-base deficit: 14.9 mmol/L — ABNORMAL HIGH (ref 0.0–2.0)
Bicarbonate: 13.2 mmol/L — ABNORMAL LOW (ref 20.0–28.0)
O2 Saturation: 47.9 %
Patient temperature: 37
pCO2, Ven: 38 mmHg — ABNORMAL LOW (ref 44–60)
pH, Ven: 7.15 — CL (ref 7.25–7.43)
pO2, Ven: 32 mmHg (ref 32–45)

## 2021-05-07 LAB — COMPREHENSIVE METABOLIC PANEL
ALT: 24 U/L (ref 0–44)
AST: 16 U/L (ref 15–41)
Albumin: 3.6 g/dL (ref 3.5–5.0)
Alkaline Phosphatase: 70 U/L (ref 38–126)
Anion gap: 16 — ABNORMAL HIGH (ref 5–15)
BUN: 28 mg/dL — ABNORMAL HIGH (ref 8–23)
CO2: 14 mmol/L — ABNORMAL LOW (ref 22–32)
Calcium: 9.2 mg/dL (ref 8.9–10.3)
Chloride: 100 mmol/L (ref 98–111)
Creatinine, Ser: 1.68 mg/dL — ABNORMAL HIGH (ref 0.61–1.24)
GFR, Estimated: 40 mL/min — ABNORMAL LOW (ref >60)
Glucose, Bld: 508 mg/dL (ref 70–99)
Potassium: 5.2 mmol/L — ABNORMAL HIGH (ref 3.5–5.1)
Sodium: 130 mmol/L — ABNORMAL LOW (ref 135–145)
Total Bilirubin: 1.1 mg/dL (ref 0.3–1.2)
Total Protein: 6.6 g/dL (ref 6.5–8.1)

## 2021-05-07 LAB — BASIC METABOLIC PANEL
Anion gap: 17 — ABNORMAL HIGH (ref 5–15)
BUN: 26 mg/dL — ABNORMAL HIGH (ref 8–23)
CO2: 12 mmol/L — ABNORMAL LOW (ref 22–32)
Calcium: 8.9 mg/dL (ref 8.9–10.3)
Chloride: 102 mmol/L (ref 98–111)
Creatinine, Ser: 1.62 mg/dL — ABNORMAL HIGH (ref 0.61–1.24)
GFR, Estimated: 42 mL/min — ABNORMAL LOW (ref >60)
Glucose, Bld: 447 mg/dL — ABNORMAL HIGH (ref 70–99)
Potassium: 5.9 mmol/L — ABNORMAL HIGH (ref 3.5–5.1)
Sodium: 131 mmol/L — ABNORMAL LOW (ref 135–145)

## 2021-05-07 LAB — CBG MONITORING, ED
Glucose-Capillary: 483 mg/dL — ABNORMAL HIGH (ref 70–99)
Glucose-Capillary: 405 mg/dL — ABNORMAL HIGH (ref 70–99)
Glucose-Capillary: 354 mg/dL — ABNORMAL HIGH (ref 70–99)
Glucose-Capillary: 283 mg/dL — ABNORMAL HIGH (ref 70–99)
Glucose-Capillary: 205 mg/dL — ABNORMAL HIGH (ref 70–99)
Glucose-Capillary: 177 mg/dL — ABNORMAL HIGH (ref 70–99)
Glucose-Capillary: 148 mg/dL — ABNORMAL HIGH (ref 70–99)
Glucose-Capillary: 151 mg/dL — ABNORMAL HIGH (ref 70–99)

## 2021-05-07 LAB — TROPONIN I (HIGH SENSITIVITY)
Troponin I (High Sensitivity): 9 ng/L (ref <18)
Troponin I (High Sensitivity): 8 ng/L

## 2021-05-07 LAB — BETA-HYDROXYBUTYRIC ACID: Beta-Hydroxybutyric Acid: >8 mmol/L — ABNORMAL HIGH (ref 0.05–0.27)

## 2021-05-07 LAB — RESP PANEL BY RT-PCR (FLU A&B, COVID) ARPGX2
Influenza A by PCR: NEGATIVE
Influenza B by PCR: NEGATIVE
SARS Coronavirus 2 by RT PCR: NEGATIVE

## 2021-05-07 LAB — PHOSPHORUS: Phosphorus: 1.7 mg/dL — ABNORMAL LOW (ref 2.5–4.6)

## 2021-05-07 MED ORDER — MIRABEGRON ER 25 MG PO TB24: 25.0000 mg | ORAL_TABLET | Freq: Every day | ORAL | Status: DC

## 2021-05-07 MED ORDER — PANTOPRAZOLE SODIUM 40 MG PO TBEC
40.0000 mg | DELAYED_RELEASE_TABLET | Freq: Every day | ORAL | Status: DC
  Administered 2021-05-08 – 2021-05-09 (×2): 40 mg via ORAL
  Filled 2021-05-07 (×2): qty 1

## 2021-05-07 MED ORDER — LACTATED RINGERS IV SOLN: INTRAVENOUS | Status: DC

## 2021-05-07 MED ORDER — MAGNESIUM SULFATE 2 GM/50ML IV SOLN
2.0000 g | Freq: Once | INTRAVENOUS | Status: AC
  Administered 2021-05-07: 2 g via INTRAVENOUS
  Filled 2021-05-07: qty 50

## 2021-05-07 MED ORDER — FLUTICASONE FUROATE-VILANTEROL 100-25 MCG/ACT IN AEPB: 1.0000 | INHALATION_SPRAY | Freq: Every day | RESPIRATORY_TRACT | Status: DC

## 2021-05-07 MED ORDER — ALLOPURINOL 100 MG PO TABS
300.0000 mg | ORAL_TABLET | Freq: Every day | ORAL | Status: DC
  Administered 2021-05-08 – 2021-05-09 (×2): 300 mg via ORAL
  Filled 2021-05-07: qty 1
  Filled 2021-05-07: qty 3
  Filled 2021-05-07: qty 1

## 2021-05-07 MED ORDER — MONTELUKAST SODIUM 10 MG PO TABS
10.0000 mg | ORAL_TABLET | Freq: Every day | ORAL | Status: DC
  Administered 2021-05-07 – 2021-05-08 (×2): 10 mg via ORAL
  Filled 2021-05-07 (×2): qty 1

## 2021-05-07 MED ORDER — LACTATED RINGERS IV BOLUS
1000.0000 mL | Freq: Once | INTRAVENOUS | Status: AC
  Administered 2021-05-07: 1000 mL via INTRAVENOUS

## 2021-05-07 MED ORDER — ALBUTEROL SULFATE (2.5 MG/3ML) 0.083% IN NEBU: 2.5000 mg | INHALATION_SOLUTION | RESPIRATORY_TRACT | Status: DC | PRN

## 2021-05-07 MED ORDER — ENOXAPARIN SODIUM 40 MG/0.4ML IJ SOSY
40.0000 mg | PREFILLED_SYRINGE | INTRAMUSCULAR | Status: DC
  Administered 2021-05-07 – 2021-05-08 (×2): 40 mg via SUBCUTANEOUS
  Filled 2021-05-07 (×2): qty 0.4

## 2021-05-07 MED ORDER — ATORVASTATIN CALCIUM 20 MG PO TABS
80.0000 mg | ORAL_TABLET | Freq: Every day | ORAL | Status: DC
  Administered 2021-05-08 – 2021-05-09 (×2): 80 mg via ORAL
  Filled 2021-05-07 (×2): qty 4

## 2021-05-07 MED ORDER — DEXTROSE IN LACTATED RINGERS 5 % IV SOLN: INTRAVENOUS | Status: DC

## 2021-05-07 MED ORDER — DEXTROSE 50 % IV SOLN: 0.0000 mL | INTRAVENOUS | Status: DC | PRN

## 2021-05-07 MED ORDER — SODIUM PHOSPHATES 45 MMOLE/15ML IV SOLN
30.0000 mmol | Freq: Once | INTRAVENOUS | Status: AC
  Administered 2021-05-07: 30 mmol via INTRAVENOUS
  Filled 2021-05-07: qty 10

## 2021-05-07 MED ORDER — BISOPROLOL FUMARATE 5 MG PO TABS
5.0000 mg | ORAL_TABLET | Freq: Every day | ORAL | Status: DC
  Administered 2021-05-08: 5 mg via ORAL
  Filled 2021-05-07: qty 1

## 2021-05-07 MED ORDER — INSULIN REGULAR(HUMAN) IN NACL 100-0.9 UT/100ML-% IV SOLN
INTRAVENOUS | Status: DC
  Administered 2021-05-07: 11.5 [IU]/h via INTRAVENOUS
  Filled 2021-05-07: qty 100

## 2021-05-07 MED ORDER — ASPIRIN EC 81 MG PO TBEC
81.0000 mg | DELAYED_RELEASE_TABLET | Freq: Every day | ORAL | Status: DC
  Administered 2021-05-08 – 2021-05-09 (×2): 81 mg via ORAL
  Filled 2021-05-07 (×2): qty 1

## 2021-05-07 MED ORDER — TAMSULOSIN HCL 0.4 MG PO CAPS
0.4000 mg | ORAL_CAPSULE | Freq: Every day | ORAL | Status: DC
  Administered 2021-05-08 – 2021-05-09 (×2): 0.4 mg via ORAL
  Filled 2021-05-07 (×2): qty 1

## 2021-05-07 NOTE — H&P (Signed)
History and Physical    Allen Hunter. TGY:[id number] DOB: [birth year] DOA: 05/07/2021  PCP: [name], MD (Confirm with patient/family/NH records and if not entered, this has to be entered at [facility] point of entry) Patient coming from: Home  I have personally briefly reviewed patient's old medical records in [place]  Chief Complaint: Feeling weak, nausea, poor appetite, Sugar high  HPI: Allen Hunter. is a 81 y.o. male with medical history significant of IDDM, COPD, BPH, mild cognitive impairment, gout, HLD, HTN, presented with feeling nauseous, poor oral intake and generalized weakness for 2 days.  Patient has been on Lantus 30 units daily for last 59-month and he checks fingerstick every day and the most occasions glucose 300-400 range.  Starting 2 days ago, he started to feel nauseous, no vomiting, denies any fever or chills no abdominal pain no diarrhea.  He is not able to eat or drink since yesterday and feeling so weak.  He did manage to take all his medications including insulin injection yesterday, but could not do it today.  Today, his physical therapist went to see him and found him very unsteady and sent him to the ED.  ED Course: Blood pressure borderline low, no tachycardia no hypoxia no fever.  Creatinine 1.6 compared to baseline 1.1, glucose 502, ABG showed pH 7.15, elevated beta hydroxy.  Patient was given IV bolus and insulin drip started in the ED.  Review of Systems: As per HPI otherwise 14 point review of systems negative.    Past Medical History:  Diagnosis Date   ALLERGIC RHINITIS 10/04/2008   ASTHMA 10/04/2008   Asthma    BACK PAIN 04/18/2008   BENIGN PROSTATIC HYPERTROPHY 03/09/2007   COLONIC POLYPS, HX OF 03/09/2007   COPD 10/20/2007   DIABETES MELLITUS, TYPE II 03/09/2007   FATIGUE 10/05/2007   GERD (gastroesophageal reflux disease) 03/01/2012   GOUT 10/19/2006   HYPERLIPIDEMIA 10/19/2006   HYPERTENSION 10/19/2006   OSA (obstructive sleep  apnea) 07/19/2015   [place]   SWELLING MASS OR LUMP IN HEAD AND NECK 01/09/2010    Past Surgical History:  Procedure Laterality Date   APPENDECTOMY       reports that he quit smoking about 36 years ago. His smoking use included cigarettes. He has a 48.00 pack-year smoking history. He has never used smokeless tobacco. He reports that he does not drink alcohol and does not use drugs.  No Known Allergies  Family History  Problem Relation Age of Onset   Hypertension Mother    Cancer Other      Prior to Admission medications   Medication Sig Start Date End Date Taking? Authorizing Provider  allopurinol (ZYLOPRIM) 300 MG tablet Take 1 tablet (300 mg total) by mouth daily. 11/14/20   JBiagio Borg MD  aspirin 81 MG tablet Take 81 mg by mouth daily.    [provider]  atorvastatin (LIPITOR) 80 MG tablet Take 1 tablet (80 mg total) by mouth daily. 11/14/20   JBiagio Borg MD  bisoprolol (ZEBETA) 5 MG tablet Take 1 tablet (5 mg total) by mouth daily. 11/14/20   JBiagio Borg MD  blood glucose meter kit and supplies Dispense based on patient and insurance preference. Use up to four times daily as directed. (FOR ICD-10 E10.9, E11.9). 07/23/20   LEnzo Bi MD  donepezil (ARICEPT) 10 MG tablet Take half tablet (5 mg) daily for 2 weeks, then increase to the full tablet at  10 mg daily Patient not taking: Reported on 04/24/2021 11/14/20   [name], MD  esomeprazole (NEXIUM) 40 MG capsule Take 1 capsule (40 mg total) by mouth daily. 11/14/20   [name], MD  Fluticasone-Umeclidin-Vilant (TRELEGY ELLIPTA) 100-62.5-25 MCG/INH AEPB Inhale 1 puff into the lungs daily. 11/14/20   [name], MD  glipiZIDE (GLUCOTROL) 5 MG tablet TAKE ONE-HALF TABLET BY MOUTH TWO TIMES A DAY FOR DIABETES. TAKE 30 MINUTES BEFORE MEAL(S) 03/18/21   [provider]  glucose blood (FREESTYLE LITE) test strip USE 1 STRIP TWICE A DAY AS NEEDED TO CHECK SUGAR 10/01/20   [name],  MD  insulin glargine (LANTUS) 100 UNIT/ML Solostar Pen Inject 30 Units into the skin daily. 11/14/20 04/24/21  [name], MD  Lancets MISC Use asd 1 per day 250.02 10/26/13   [name], MD  losartan (COZAAR) 25 MG tablet Take 1 tablet (25 mg total) by mouth daily. Patient not taking: Reported on 04/24/2021 04/23/21   [name], MD  metFORMIN (GLUCOPHAGE) 1000 MG tablet Take 1 tablet (1,000 mg total) by mouth 2 (two) times daily with a meal. 11/14/20 04/24/21  [name], MD  mirabegron ER (MYRBETRIQ) 25 MG TB24 tablet Take 25 mg by mouth daily.    [provider]  montelukast (SINGULAIR) 10 MG tablet Take 1 tablet (10 mg total) by mouth at bedtime. 11/14/20   [name], MD  Multiple Vitamins-Minerals (CENTRUM MEN PO) Take 1 tablet by mouth daily. Patient not taking: Reported on 04/24/2021    [provider]  Omega-3 Fatty Acids (FISH OIL) 500 MG CAPS Take 1 capsule by mouth daily.    [provider]  solifenacin (VESICARE) 5 MG tablet Take 1 tablet (5 mg total) by mouth daily. Patient not taking: Reported on 04/24/2021 11/14/20   [name], MD  tamsulosin (FLOMAX) 0.4 MG CAPS capsule Take 1 capsule (0.4 mg total) by mouth daily. 11/14/20   [name], MD    Physical Exam: Vitals:   05/07/21 1545 05/07/21 1546 05/07/21 1630 05/07/21 1730  BP:  117/64 (!) 111/48 (!) 110/50  Pulse:  78 72 70  Resp:  _0 Temp:  97.9 F (36.6 C)    TempSrc:  Oral    SpO2:  98% 98% 98%  Weight: 78.9 kg     Height: 5' 6" (1.676 m)       Constitutional: NAD, calm, comfortable Vitals:   05/07/21 1545 05/07/21 1546 05/07/21 1630 05/07/21 1730  BP:  117/64 (!) 111/48 (!) 110/50  Pulse:  78 72 70  Resp:  _1 Temp:  97.9 F (36.6 C)    TempSrc:  Oral    SpO2:  98% 98% 98%  Weight: 78.9 kg     Height: 5' 6" (1.676 m)      Eyes: PERRL, lids and conjunctivae normal ENMT: Mucous membranes are dry. Posterior pharynx clear of any exudate or lesions.Normal  dentition.  Neck: normal, supple, no masses, no thyromegaly Respiratory: clear to auscultation bilaterally, no wheezing, no crackles. Normal respiratory effort. No accessory muscle use.  Cardiovascular: Regular rate and rhythm, no murmurs / rubs / gallops. No extremity edema. 2+ pedal pulses. No carotid bruits.  Abdomen: no tenderness, no masses palpated. No hepatosplenomegaly. Bowel sounds positive.  Musculoskeletal: no clubbing / cyanosis. No joint deformity upper and lower extremities. Good ROM, no contractures. Normal muscle tone.  Skin: no rashes, lesions, ulcers.  No induration Neurologic: CN 2-12 grossly intact. Sensation intact, DTR normal. Strength 5/5 in all 4.  Psychiatric: Normal judgment and insight. Alert and oriented x 3. Normal mood.     Labs on Admission: I have personally reviewed following labs and imaging studies  CBC: Recent Labs  Lab 05/07/21 1551  WBC 4.1  NEUTROABS 2.7  HGB 13.5  HCT 44.4  MCV 97.8  PLT 801   Basic Metabolic Panel: Recent Labs  Lab 05/07/21 1551  NA 130*  K 5.2*  CL 100  CO2 14*  GLUCOSE 508*  BUN 28*  CREATININE 1.68*  CALCIUM 9.2   GFR: Estimated Creatinine Clearance: 33.5 mL/min (A) (by C-G formula based on SCr of 1.68 mg/dL (H)). Liver Function Tests: Recent Labs  Lab 05/07/21 1551  AST 16  ALT 24  ALKPHOS 70  BILITOT 1.1  PROT 6.6  ALBUMIN 3.6   No results for input(s): LIPASE, AMYLASE in the last 168 hours. No results for input(s): AMMONIA in the last 168 hours. Coagulation Profile: No results for input(s): INR, PROTIME in the last 168 hours. Cardiac Enzymes: No results for input(s): CKTOTAL, CKMB, CKMBINDEX, TROPONINI in the last 168 hours. BNP (last 3 results) No results for input(s): PROBNP in the last 8760 hours. HbA1C: No results for input(s): HGBA1C in the last 72 hours. CBG: Recent Labs  Lab 05/07/21 1546 05/07/21 1705  GLUCAP 483* 405*   Lipid Profile: No results for input(s): CHOL, HDL,  LDLCALC, TRIG, CHOLHDL, LDLDIRECT in the last 72 hours. Thyroid Function Tests: No results for input(s): TSH, T4TOTAL, FREET4, T3FREE, THYROIDAB in the last 72 hours. Anemia Panel: No results for input(s): VITAMINB12, FOLATE, FERRITIN, TIBC, IRON, RETICCTPCT in the last 72 hours. Urine analysis:    Component Value Date/Time   COLORURINE YELLOW 04/19/2021 0240   APPEARANCEUR CLEAR (A) 04/19/2021 0240   LABSPEC 1.020 04/19/2021 0240   PHURINE 5.0 04/19/2021 0240   GLUCOSEU >1,000 (A) 04/19/2021 0240   GLUCOSEU >=1000 (A) 04/08/2021 1459   HGBUR TRACE (A) 04/19/2021 0240   BILIRUBINUR MODERATE (A) 04/19/2021 0240   KETONESUR >160 (A) 04/19/2021 0240   PROTEINUR 100 (A) 04/19/2021 0240   UROBILINOGEN 0.2 04/08/2021 1459   NITRITE NEGATIVE 04/19/2021 0240   LEUKOCYTESUR NEGATIVE 04/19/2021 0240    Radiological Exams on Admission: No results found.  EKG: Independently reviewed.  Sinus, no acute ST changes  Assessment/Plan Principal Problem:   DKA (diabetic ketoacidosis) ([place])  (please populate well all problems here in Problem List. (For example, if patient is on BP meds at home and you resume or decide to hold them, it is a problem that needs to be her. Same for CAD, COPD, HLD and so on)  DKA -On insulin drip. -I proposed to change his insulin regimen of Lantus only to 70/30 and recommend he follows up with outpatient endocrinology.  Patient expressed understanding and agreed.  AKI -Likely prerenal secondary to poor oral intake -Received IV bolus, continue maintenance IV fluid, repeat BMP tomorrow.  Hyperkalemia, mild -No EKG changes, secondary to AKI and DKA, IV fluid then reevaluate.  IDDM -On insulin drip, hold metformin due to AKI.  HTN -Hold losartan due to AKI.  COPD -Stable, no symptoms or signs of acute exacerbation.  Gout -Continue allopurinol.  Mild cognitive impairment -Patient baseline.  DVT prophylaxis: Lovenox Code Status: Full code Family  Communication: None at bedside Disposition Plan: Expect 1 to 2 days hospital stay for insulin drip.  Also ordered PT evaluation. Consults called: None Admission  status: Progressive   Lequita Halt MD [facility] Pager [phone]  05/07/2021, 6:00 PM

## 2021-05-07 NOTE — Progress Notes (Addendum)
Cross Cover Patient admitted with DKA. Review of labs show last BMP 1730, worsening bicarb, worsening hyperkalemia and mild improvement in creatinine. Only 1 liter fluid bolus given thus far. Additional liter LR bolus ordered. Mag and phos levels with next blood draw (not previously done) 2107 pm. Mag 1.7 - 2 gms ordered Phos 1.7 - 30 mmol sodium phosphate ordered

## 2021-05-07 NOTE — ED Notes (Signed)
Per Dr. Jari Pigg, disregard BMP order due at 1642. Draw next scheduled BMP instead.

## 2021-05-07 NOTE — Progress Notes (Signed)
PHARMACIST - PHYSICIAN COMMUNICATION  CONCERNING:  Enoxaparin (Lovenox) for DVT Prophylaxis    RECOMMENDATION: Patient was prescribed enoxaparin 40mg  q24 hours for VTE prophylaxis.   Filed Weights   05/07/21 1545  Weight: 78.9 kg (174 lb)    Body mass index is 28.08 kg/m.  Estimated Creatinine Clearance: 33.5 mL/min (A) (by C-G formula based on SCr of 1.68 mg/dL (H)).   Patient is candidate for enoxaparin 40mg  every 24 hours based on CrCl >35ml/min or Weight >45kg  DESCRIPTION: Pharmacy has adjusted enoxaparin dose per [facility] policy.  Patient is now receiving enoxaparin 40 mg every 24 hours   Benita Gutter 05/07/2021 5:46 PM

## 2021-05-07 NOTE — ED Provider Notes (Signed)
[facility] Provider Note    Event Date/Time   First MD Initiated Contact with Patient 05/07/21 1540     (approximate)   History   Hyperglycemia   HPI  Allen Hunter. is a 81 y.o. male with diabetes who comes in with concerns for elevated sugar and global weakness.  Patient lives by himself and the physical therapy was there and they noted that he seemed to be a little bit more off balance with walking and his sugars however were very elevated and his blood pressures were low therefore they called EMS.  When EMS got there blood pressures were normal but patient's sugars were elevated.  Patient is alert and oriented x2.  He states that he has not been taking his insulin.  There was some concerns that his insulin might have been changed around recently per EMS.  Patient states that his family member typically comes to help him with his medications but is unclear why he has not been taking them recently.  He denies any weakness on one side, numbness, chest pain, abdominal pain or any other symptoms.      Physical Exam   Triage Vital Signs: ED Triage Vitals  Enc Vitals Group     BP 05/07/21 1546 117/64     Pulse Rate 05/07/21 1546 78     Resp 05/07/21 1546 17     Temp 05/07/21 1546 97.9 F (36.6 C)     Temp Source 05/07/21 1546 Oral     SpO2 05/07/21 1546 98 %     Weight 05/07/21 1545 174 lb (78.9 kg)     Height 05/07/21 1545 5\' 6"  (1.676 m)     Head Circumference --      Peak Flow --      Pain Score 05/07/21 1545 0     Pain Loc --      Pain Edu? --      Excl. in [place]? --     Most recent vital signs: Vitals:   05/07/21 1546  BP: 117/64  Pulse: 78  Resp: 17  Temp: 97.9 F (36.6 C)  SpO2: 98%     General: Awake, no distress.  CV:  Good peripheral perfusion.  Resp:  Normal effort.  Abd:  No distention.  Soft and nontender Other:  Equal strength in arms and legs.  Sensation intact.  Cranial nerves II through XII are intact.   ED Results  / Procedures / Treatments   Labs (all labs ordered are listed, but only abnormal results are displayed) Labs Reviewed  CBG MONITORING, ED - Abnormal; Notable for the following components:      Result Value   Glucose-Capillary 483 (*)    All other components within normal limits  RESP PANEL BY RT-PCR (FLU A&B, COVID) ARPGX2  CBC WITH DIFFERENTIAL/PLATELET  COMPREHENSIVE METABOLIC PANEL  BETA-HYDROXYBUTYRIC ACID  BLOOD GAS, VENOUS  TROPONIN I (HIGH SENSITIVITY)     EKG  My interpretation of EKG:  Normal sinus rate of 80 without any ST elevation or T wave inversions with a little artifact noted at the beginning of the EKG   PROCEDURES:  Critical Care performed: Yes, see critical care procedure note(s)  .1-3 Lead EKG Interpretation Performed by: Vanessa [place], MD Authorized by: Vanessa [place], MD     Interpretation: normal     ECG rate:  70   ECG rate assessment: normal     Rhythm: sinus rhythm     Ectopy: none  Conduction: normal   .Critical Care Performed by: Vanessa [place], MD Authorized by: Vanessa [place], MD   Critical care provider statement:    Critical care time (minutes):  30   Critical care was necessary to treat or prevent imminent or life-threatening deterioration of the following conditions:  Endocrine crisis   Critical care was time spent personally by me on the following activities:  Development of treatment plan with patient or surrogate, discussions with consultants, evaluation of patient's response to treatment, examination of patient, ordering and review of laboratory studies, ordering and review of radiographic studies, ordering and performing treatments and interventions, pulse oximetry, re-evaluation of patient's condition and review of old charts   [place] ED: Medications  insulin regular, human (MYXREDLIN) 100 units/ 100 mL infusion (has no administration in time range)  dextrose 5 % in lactated ringers infusion (has no  administration in time range)  dextrose 50 % solution 0-50 mL (has no administration in time range)  lactated ringers infusion (has no administration in time range)  lactated ringers bolus 1,000 mL (1,000 mLs Intravenous New Bag/Given 05/07/21 1554)     IMPRESSION / MDM / [place] / ED COURSE  I reviewed the triage vital signs and the nursing notes.                             Patient with diabetes who comes in with concerns for weakness and elevated sugars Differential diagnosis includes, but is not limited to, dehydration, AKI, DKA, COVID, flu, ACS.  Will get labs to further evaluate and start 1 L of fluid.  Neuro exam is reassuring.  Patient denies any fall suggest intracranial hemorrhage and no evidence of stroke based on examination.   CBC shows no anemia, no white count elevation CMP shows low sodium but most likely corrected for his hyperglycemia.  Patient has new AKI of 1.6 and anion gap is elevated concerning for DKA Patient's VBG shows low bicarb is significantly acidotic   Patient's labs are concerning for DKA.  Patient given 1 L of fluid and will be started on insulin drip and I will discuss the hospital team for admission.    The patient is on the cardiac monitor to evaluate for evidence of arrhythmia and/or significant heart rate changes. FINAL CLINICAL IMPRESSION(S) / ED DIAGNOSES   Final diagnoses:  Diabetic ketoacidosis without coma associated with type 2 diabetes mellitus ([place])  Hyperglycemia  AKI (acute kidney injury) ([place])     Rx / DC Orders   ED Discharge Orders     None        Note:  This document was prepared using Dragon voice recognition software and may include unintentional dictation errors.   Vanessa [place], MD 05/07/21 [phone]

## 2021-05-07 NOTE — ED Notes (Signed)
CBG 0125

## 2021-05-07 NOTE — ED Triage Notes (Signed)
Pt BIB EMS from home, newly diagnosed DM, recent med change-- stopped taking Jardiance. Pt states he hasn't taken his insulin for 2 days. Pt's physical therapy was at home and called EMS d/t ?hypotension, concerned that pt lives by himself, hyperglycemia.   124/76 HR 68 NSR Glu 541

## 2021-05-08 ENCOUNTER — Encounter: Payer: Self-pay | Admitting: Internal Medicine

## 2021-05-08 ENCOUNTER — Telehealth: Payer: Self-pay | Admitting: Internal Medicine

## 2021-05-08 DIAGNOSIS — E0810 Diabetes mellitus due to underlying condition with ketoacidosis without coma: Secondary | ICD-10-CM

## 2021-05-08 DIAGNOSIS — J449 Chronic obstructive pulmonary disease, unspecified: Secondary | ICD-10-CM

## 2021-05-08 DIAGNOSIS — M109 Gout, unspecified: Secondary | ICD-10-CM

## 2021-05-08 DIAGNOSIS — E1165 Type 2 diabetes mellitus with hyperglycemia: Secondary | ICD-10-CM

## 2021-05-08 DIAGNOSIS — E1110 Type 2 diabetes mellitus with ketoacidosis without coma: Principal | ICD-10-CM

## 2021-05-08 DIAGNOSIS — N179 Acute kidney failure, unspecified: Secondary | ICD-10-CM

## 2021-05-08 LAB — BASIC METABOLIC PANEL
Anion gap: 6 (ref 5–15)
Anion gap: 4 — ABNORMAL LOW (ref 5–15)
Anion gap: 4 — ABNORMAL LOW (ref 5–15)
BUN: 20 mg/dL (ref 8–23)
BUN: 18 mg/dL (ref 8–23)
BUN: 15 mg/dL (ref 8–23)
CO2: 21 mmol/L — ABNORMAL LOW (ref 22–32)
CO2: 23 mmol/L (ref 22–32)
CO2: 22 mmol/L (ref 22–32)
Calcium: 8.8 mg/dL — ABNORMAL LOW (ref 8.9–10.3)
Calcium: 8.7 mg/dL — ABNORMAL LOW (ref 8.9–10.3)
Calcium: 8.6 mg/dL — ABNORMAL LOW (ref 8.9–10.3)
Chloride: 108 mmol/L (ref 98–111)
Chloride: 110 mmol/L (ref 98–111)
Chloride: 109 mmol/L (ref 98–111)
Creatinine, Ser: 1.25 mg/dL — ABNORMAL HIGH (ref 0.61–1.24)
Creatinine, Ser: 1.14 mg/dL (ref 0.61–1.24)
Creatinine, Ser: 1.1 mg/dL (ref 0.61–1.24)
GFR, Estimated: 57 mL/min — ABNORMAL LOW (ref >60)
GFR, Estimated: >60 mL/min (ref >60)
GFR, Estimated: >60 mL/min (ref >60)
Glucose, Bld: 259 mg/dL — ABNORMAL HIGH (ref 70–99)
Glucose, Bld: 181 mg/dL — ABNORMAL HIGH (ref 70–99)
Glucose, Bld: 261 mg/dL — ABNORMAL HIGH (ref 70–99)
Potassium: 3.9 mmol/L (ref 3.5–5.1)
Potassium: 3.7 mmol/L (ref 3.5–5.1)
Potassium: 3.6 mmol/L (ref 3.5–5.1)
Sodium: 135 mmol/L (ref 135–145)
Sodium: 137 mmol/L (ref 135–145)
Sodium: 135 mmol/L (ref 135–145)

## 2021-05-08 LAB — BETA-HYDROXYBUTYRIC ACID: Beta-Hydroxybutyric Acid: 0.21 mmol/L (ref 0.05–0.27)

## 2021-05-08 LAB — CBG MONITORING, ED
Glucose-Capillary: 179 mg/dL — ABNORMAL HIGH (ref 70–99)
Glucose-Capillary: 174 mg/dL — ABNORMAL HIGH (ref 70–99)
Glucose-Capillary: 153 mg/dL — ABNORMAL HIGH (ref 70–99)
Glucose-Capillary: 129 mg/dL — ABNORMAL HIGH (ref 70–99)
Glucose-Capillary: 223 mg/dL — ABNORMAL HIGH (ref 70–99)
Glucose-Capillary: 240 mg/dL — ABNORMAL HIGH (ref 70–99)
Glucose-Capillary: 251 mg/dL — ABNORMAL HIGH (ref 70–99)
Glucose-Capillary: 172 mg/dL — ABNORMAL HIGH (ref 70–99)

## 2021-05-08 LAB — GLUCOSE, CAPILLARY
Glucose-Capillary: 183 mg/dL — ABNORMAL HIGH (ref 70–99)
Glucose-Capillary: 142 mg/dL — ABNORMAL HIGH (ref 70–99)

## 2021-05-08 MED ORDER — INSULIN ASPART 100 UNIT/ML IJ SOLN
0.0000 [IU] | Freq: Three times a day (TID) | INTRAMUSCULAR | Status: DC
  Administered 2021-05-08: 3 [IU] via SUBCUTANEOUS
  Administered 2021-05-08: 4 [IU] via SUBCUTANEOUS
  Administered 2021-05-08: 11 [IU] via SUBCUTANEOUS
  Filled 2021-05-08 (×3): qty 1

## 2021-05-08 MED ORDER — INSULIN GLARGINE-YFGN 100 UNIT/ML ~~LOC~~ SOLN: 40.0000 [IU] | SUBCUTANEOUS | Status: DC

## 2021-05-08 MED ORDER — INSULIN GLARGINE-YFGN 100 UNIT/ML ~~LOC~~ SOLN: 20.0000 [IU] | SUBCUTANEOUS | Status: DC

## 2021-05-08 MED ORDER — INSULIN GLARGINE-YFGN 100 UNIT/ML ~~LOC~~ SOLN
20.0000 [IU] | SUBCUTANEOUS | Status: DC
Start: 2021-05-08 — End: 2021-05-09
  Administered 2021-05-08 – 2021-05-09 (×2): 20 [IU] via SUBCUTANEOUS
  Filled 2021-05-08 (×2): qty 0.2

## 2021-05-08 MED ORDER — LACTATED RINGERS IV SOLN: INTRAVENOUS | Status: DC

## 2021-05-08 NOTE — Evaluation (Signed)
Physical Therapy Evaluation Patient Details Name: Allen Hunter. MRN: [id number] DOB: [birth year] Today's Date: 05/08/2021  History of Present Illness  presented to ER secondary to weakness, nausea, decreased appetite; admitted for management of DKA  Clinical Impression  Patient resting in bed upon arrival to room; sleeping, but easily arousable.  Alert and oriented to self, location as hospital; unaware of day, date or year.  Follows simple commands; limited safety awareness/insight evident.  Bilat UE/LE strength and ROM grossly symmetrical and WFL; no focal weakness noted. Able to complete bed mobility with mod indep; sit/stand, basic transfers and gait (60') with RW, cga/close sup.  Demonstrates reciprocal stepping pattern with fair step height/length; fair cadence; min cuing for walker position/safety.  Do recommend continued use of RW at this time. Would benefit from skilled PT to address above deficits and promote optimal return to PLOF.; Recommend transition to HHPT upon discharge from acute hospitalization.      Recommendations for follow up therapy are one component of a multi-disciplinary discharge planning process, led by the attending physician.  Recommendations may be updated based on patient status, additional functional criteria and insurance authorization.  Follow Up Recommendations Home health PT    Assistance Recommended at Discharge Intermittent Supervision/Assistance  Patient can return home with the following  A little help with walking and/or transfers;A little help with bathing/dressing/bathroom;Assist for transportation;Help with stairs or ramp for entrance    Equipment Recommendations    Recommendations for Other Services       Functional Status Assessment Patient has had a recent decline in their functional status and demonstrates the ability to make significant improvements in function in a reasonable and predictable amount of time.     Precautions /  Restrictions Precautions Precautions: Fall Restrictions Weight Bearing Restrictions: No      Mobility  Bed Mobility Overal bed mobility: Modified Independent                  Transfers Overall transfer level: Needs assistance Equipment used: Rolling walker (2 wheels) Transfers: Sit to/from Stand Sit to Stand: Min guard           General transfer comment: cuing for hand placement, tends to pull on RW    Ambulation/Gait Ambulation/Gait assistance: Min guard Gait Distance (Feet): 60 Feet Assistive device: Rolling walker (2 wheels)         General Gait Details: reciprocal stepping pattern with fair step height/length; fair cadence; min cuing for walker position/safety  Stairs            Wheelchair Mobility    Modified Rankin (Stroke Patients Only)       Balance Overall balance assessment: Needs assistance Sitting-balance support: No upper extremity supported, Feet supported Sitting balance-Leahy Scale: Fair Sitting balance - Comments: tends to list posteriorally with fatigue, divided attention Postural control: Posterior lean Standing balance support: Bilateral upper extremity supported Standing balance-Leahy Scale: Fair                               Pertinent Vitals/Pain Pain Assessment Pain Assessment: No/denies pain    Home Living Family/patient expects to be discharged to:: Private residence Living Arrangements: Alone Available Help at Discharge: Available PRN/intermittently Type of Home: House Home Access: Stairs to enter Entrance Stairs-Rails: Left Entrance Stairs-Number of Steps: 3   Home Layout: One level Home Equipment: Grab bars - tub/shower;Shower seat - built Medical sales representative (2 wheels) Additional Comments: Patient is intermittently confused,  can answer simple questions but not complex.    Prior Function Prior Level of Function : Patient poor historian/Family not available;Driving;Independent/Modified  Independent             Mobility Comments: Mod indep without assist device; per chart, was active with [facility] RN, PT, OT prior to admission; + driving.       Hand Dominance   Dominant Hand: Right    Extremity/Trunk Assessment   Upper Extremity Assessment Upper Extremity Assessment: Overall WFL for tasks assessed    Lower Extremity Assessment Lower Extremity Assessment: Overall WFL for tasks assessed (grossly at least 4/5 throughout)       Communication   Communication: No difficulties  Cognition Arousal/Alertness: Awake/alert Behavior During Therapy: WFL for tasks assessed/performed Overall Cognitive Status: No family/caregiver present to determine baseline cognitive functioning                                 General Comments: Alert and oriented to self, location as hospital only; unaware of date (day, date or year).  Follows simple commands.  Limited insight/safety awareness.        General Comments      Exercises     Assessment/Plan    PT Assessment Patient needs continued PT services  PT Problem List Decreased strength;Decreased activity tolerance;Decreased balance;Decreased mobility;Decreased coordination;Cardiopulmonary status limiting activity;Decreased safety awareness;Decreased knowledge of use of DME       PT Treatment Interventions DME instruction;Gait training;Stair training;Functional mobility training;Therapeutic activities;Therapeutic exercise;Manual techniques;Patient/family education;Cognitive remediation;Neuromuscular re-education;Balance training    PT Goals (Current goals can be found in the Care Plan section)  Acute Rehab PT Goals Patient Stated Goal: patient confused PT Goal Formulation: Patient unable to participate in goal setting Time For Goal Achievement: 05/22/21 Potential to Achieve Goals: Fair    Frequency Min 2X/week     Co-evaluation               AM-PAC PT "6 Clicks" Mobility  Outcome Measure Help  needed turning from your back to your side while in a flat bed without using bedrails?: None Help needed moving from lying on your back to sitting on the side of a flat bed without using bedrails?: None Help needed moving to and from a bed to a chair (including a wheelchair)?: A Little Help needed standing up from a chair using your arms (e.g., wheelchair or bedside chair)?: A Little Help needed to walk in hospital room?: A Little Help needed climbing 3-5 steps with a railing? : A Little 6 Click Score: 20    End of Session Equipment Utilized During Treatment: Gait belt Activity Tolerance: Patient tolerated treatment well Patient left: in chair;with call bell/phone within reach;with chair alarm set Nurse Communication: Mobility status (encouraged discontinuation of purewick) PT Visit Diagnosis: Unsteadiness on feet (R26.81);Other abnormalities of gait and mobility (R26.89);Muscle weakness (generalized) (M62.81);Difficulty in walking, not elsewhere classified (R26.2)    Time: 4481-8563 PT Time Calculation (min) (ACUTE ONLY): 23 min   Charges:   PT Evaluation $PT Eval Moderate Complexity: 1 Mod          Samhitha Rosen H. Owens Shark, PT, DPT, NCS 05/08/21, 5:16 PM [phone]

## 2021-05-08 NOTE — ED Notes (Signed)
Cbg 129

## 2021-05-08 NOTE — ED Notes (Signed)
Pharmacy contacted to send semglee

## 2021-05-08 NOTE — ED Notes (Signed)
No orders for BP per Dr Manuella Ghazi

## 2021-05-08 NOTE — Telephone Encounter (Signed)
Connected to [facility] 2.15.2023.  Caller states she is calling from the office with a therapist on the other line concerning about a patient blood sugar 443.  Advised to call EMS 911.

## 2021-05-08 NOTE — Assessment & Plan Note (Signed)
Now resolved and transition to subcu insulin.  Diabetic nurse following.  Started on insulin Semglee 20 units subcu daily and sliding scale for now

## 2021-05-08 NOTE — ED Notes (Signed)
Pharmacy contacted x2 to send semglee

## 2021-05-08 NOTE — ED Notes (Signed)
Provided notified of Endotool recommendations to transition to subQ insulin. Awaiting orders.

## 2021-05-08 NOTE — Care Management Important Message (Signed)
Important Message  Patient Details  Name: Allen Hunter. MRN: [id number] Date of Birth: January 29, 1941   Medicare Important Message Given:  N/A - LOS <3 / Initial given by admissions     Dannette Barbara 05/08/2021, 4:45 PM

## 2021-05-08 NOTE — ED Notes (Signed)
Cbg 251

## 2021-05-08 NOTE — Assessment & Plan Note (Signed)
Resolved with hydration likely prerenal

## 2021-05-08 NOTE — ED Notes (Signed)
Cbg 240. 

## 2021-05-08 NOTE — Progress Notes (Addendum)
Inpatient Diabetes Program Recommendations  AACE/ADA: New Consensus Statement on Inpatient Glycemic Control (2015)  Target Ranges:  Prepandial:   less than 140 mg/dL      Peak postprandial:   less than 180 mg/dL (1-2 hours)      Critically ill patients:  140 - 180 mg/dL   Lab Results  Component Value Date   GLUCAP 223 (H) 05/08/2021   HGBA1C 8.7 (H) 04/08/2021    Review of Glycemic Control  Diabetes history: DM2 Outpatient Diabetes medications: Lantus 34 units qhs, Glucotrol 2.5 mg bid, Metformin 1 gm bid Current orders for Inpatient glycemic control: IV insulin transitioning to Semglee 20 units, Novolog 0-20 units qid  Inpatient Diabetes Program Recommendations:   Patient was seen by DM coordinator on 04/21/21 regarding admission with DKA and patient admitted to getting confused about his medications including his insulin. MD notes discuss patient most likely missing doses of insulin.  Please continue IV insulin until 2 hrs. After received Semglee and cover CBG with Novolog correction scale @ time IV insulin is discontinued.  Will follow during hospitalization. Spoke with patient's contact sister [name] Blunt concerning if she is available to assist patient if necessary with insulin administration. Sister stated " I would faint, but my husband can help if someone teaches him." Sister shared that patient is a "loner" and does not want to be helped most of the time.  Relayed information to Dr. Manuella Ghazi.  Thank you, [name]. Adisen Bennion, RN, MSN, CDE  Diabetes Coordinator Inpatient Glycemic Control Team Team Pager [phone] (8am-5pm) 05/08/2021 9:37 AM

## 2021-05-08 NOTE — ED Notes (Signed)
This RN spoke to pt about starting 2nd IV to have access for insulin to infuse by itself, pt refused. This RN attempted to educate pt about need to 2nd IV, pt continued to refuse.

## 2021-05-08 NOTE — Assessment & Plan Note (Signed)
Noncompliance is an issue.  Hemoglobin A1c 8.7

## 2021-05-08 NOTE — Progress Notes (Signed)
°  Progress Note   Patient: Allen Hunter. ERX:[id number] DOB: 09-05-1940 DOA: 05/07/2021     1 DOS: the patient was seen and examined on 05/08/2021   Brief hospital course: 81 y.o. male with medical history significant of IDDM, COPD, BPH, mild cognitive impairment, gout, HLD, HTN, presented with feeling nauseous, poor oral intake and generalized weakness for 2 days.  2/16: DKA resolved and transition to subcu insulin   Assessment and Plan: * DKA (diabetic ketoacidosis) ([place])- (present on admission) Now resolved and transition to subcu insulin.  Diabetic nurse following.  Started on insulin Semglee 20 units subcu daily and sliding scale for now  AKI (acute kidney injury) ([place])- (present on admission) Resolved with hydration likely prerenal  COPD (chronic obstructive pulmonary disease) ([place])- (present on admission) Stable.  Continue Singulair and albuterol nebulizer as needed  GOUT- (present on admission) Continue allopurinol  Diabetes ([place]) Noncompliance is an issue.  Hemoglobin A1c 8.7        Subjective: Wants to eat.   Feeling weak  Physical Exam: Vitals:   05/08/21 1100 05/08/21 1115 05/08/21 1200 05/08/21 1532  BP: (!) 96/59  102/62 98/60  Pulse: (!) 58 61 62 63  Resp: 20  18 18   Temp:   98.2 F (36.8 C) 98.2 F (36.8 C)  TempSrc:   Oral Oral  SpO2: 98% 97% 94% 97%  Weight:      Height:       Eyes: PERRL, lids and conjunctivae normal ENMT: Mucous membranes are dry. Posterior pharynx clear of any exudate or lesions.Normal dentition.  Neck: normal, supple, no masses, no thyromegaly Respiratory: clear to auscultation bilaterally, no wheezing, no crackles. Normal respiratory effort. No accessory muscle use.  Cardiovascular: Regular rate and rhythm, no murmurs / rubs / gallops. No extremity edema. 2+ pedal pulses. No carotid bruits.  Abdomen: no tenderness, no masses palpated. No hepatosplenomegaly. Bowel sounds positive.  Musculoskeletal: no clubbing / cyanosis.  No joint deformity upper and lower extremities. Good ROM, no contractures. Normal muscle tone.  Skin: no rashes, lesions, ulcers. No induration Neurologic: CN 2-12 grossly intact. Sensation intact, DTR normal. Strength 5/5 in all 4.  Psychiatric: Normal judgment and insight. Alert and oriented x 3. Normal mood.   Data Reviewed:  Blood sugar improving  Family Communication: None at bedside  Disposition: Status is: Inpatient Remains inpatient appropriate because: Diabetes management   DVT prophylaxis-subcu Lovenox        Planned Discharge Destination: Home     Time spent: 35 minutes  Author: Max Sane, MD 05/08/2021 4:57 PM  For on call review [email or web address].

## 2021-05-08 NOTE — Assessment & Plan Note (Signed)
Stable.  Continue Singulair and albuterol nebulizer as needed

## 2021-05-08 NOTE — ED Notes (Signed)
Dr Rolin Barry to notify regarding pt staying hypotensive this am. MAP >65. Awaiting response

## 2021-05-08 NOTE — Assessment & Plan Note (Signed)
Continue allopurinol 

## 2021-05-08 NOTE — ED Notes (Signed)
Pt transitioned to a hospital bed to promote comfort.  °

## 2021-05-08 NOTE — Hospital Course (Signed)
81 y.o. male with medical history significant of IDDM, COPD, BPH, mild cognitive impairment, gout, HLD, HTN, presented with feeling nauseous, poor oral intake and generalized weakness for 2 days.  2/16: DKA resolved and transition to subcu insulin

## 2021-05-08 NOTE — ED Notes (Signed)
Cbg 223

## 2021-05-08 NOTE — TOC Initial Note (Signed)
Transition of Care (TOC) - Initial/Assessment Note    Patient Details  Name: Allen Hunter. MRN: [id number] Date of Birth: 01-20-1941  Transition of Care [facility]) CM/SW Contact:    Shelbie Hutching, RN Phone Number: 05/08/2021, 12:33 PM  Clinical Narrative:                 Patient admitted to the hospital for DKA.  RNCM met with patient at the bedside and explained role in discharge planning.  Patient reports he is from home where he lives alone, he says that he is independent and drives.  He has sisters that live near by.  Patient walks with a walker.  He currently has [place] coming out, RN, PT, and OT.  [place] with [facility] notified of admission, she reports that she will get him enrolled in their diabetes program.   Patient's sister Allen Hunter will pick him up at discharge and transport him home.    Expected Discharge Plan: [place] Barriers to Discharge: Continued Medical Work up   Patient Goals and CMS Choice Patient states their goals for this hospitalization and ongoing recovery are:: he wants to go home CMS Medicare.gov Compare Post Acute Care list provided to:: Patient Choice offered to / list presented to : Patient  Expected Discharge Plan and Services Expected Discharge Plan: [place]   Discharge Planning Services: CM Consult Post Acute Care Choice: [place] arrangements for the past 2 months: Single Family Home                 DME Arranged: N/A DME Agency: NA       HH Arranged: RN, PT, OT, Nurse's Aide [place] Agency: [place] Date [place]: 05/08/21 Time [place]: 1232 Representative spoke with at [place]: [place]  Prior Living Arrangements/Services Living arrangements for the past 2 months: [place] with:: Self Patient language and need for interpreter reviewed:: Yes Do you feel safe going back to the place where you live?: Yes      Need for  Family Participation in Patient Care: Yes (Comment) Care giver support system in place?: Yes (comment) Current home services: DME, Home OT, Home PT, Home RN Gilford Rile, [facility] with [facility]) Criminal Activity/Legal Involvement Pertinent to Current Situation/Hospitalization: No - Comment as needed  Activities of Daily Living      Permission Sought/Granted Permission sought to share information with : Case Manager, Family Supports, Other (comment) Permission granted to share information with : Yes, Verbal Permission Granted  Share Information with NAME: Allen Hunter Blunt  Permission granted to share info w AGENCY: [facility]  Permission granted to share info w Relationship: sister  Permission granted to share info w Contact Information: [phone]  Emotional Assessment Appearance:: Appears stated age Attitude/Demeanor/Rapport: Engaged Affect (typically observed): Accepting Orientation: : Oriented to Self, Oriented to Place, Oriented to  Time, Oriented to Situation Alcohol / Substance Use: Not Applicable Psych Involvement: No (comment)  Admission diagnosis:  DKA (diabetic ketoacidosis) ([place]) [E11.10] Patient Active Problem List   Diagnosis Date Noted   Acute metabolic encephalopathy 25/42/7062   Exacerbation of asthma 11/17/2020   OAB (overactive bladder) 11/17/2020   Mild cognitive impairment 08/01/2020   DKA (diabetic ketoacidosis) ([place]) 07/20/2020   Hyponatremia 07/20/2020   Dehydration 07/20/2020   Constipation 05/10/2019   Chronic diastolic CHF (congestive heart failure) ([place]) 11/15/2018   DOE (dyspnea on exertion) 05/17/2018   OSA (obstructive sleep  apnea) 07/19/2015   Asthma with acute exacerbation 04/10/2014   Unspecified asthma, with exacerbation 09/05/2013   GERD (gastroesophageal reflux disease) 03/01/2012   Diarrhea 03/01/2012   Hypersomnolence 01/19/2012   Prostate cancer ([place]) 01/19/2012   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   Increased prostate specific antigen (PSA)  velocity 01/21/2011   Erectile dysfunction 01/20/2011   Bladder neck obstruction 01/20/2011   Encounter for well adult exam with abnormal findings 01/18/2011   Localized swelling, mass and lump, head 01/09/2010   Allergic rhinitis 10/04/2008   Asthma 10/04/2008   BACK PAIN 04/18/2008   COPD (chronic obstructive pulmonary disease) ([place]) 10/20/2007   Fatigue 10/05/2007   Diabetes ([place]) 03/09/2007   BPH (benign prostatic hyperplasia) 03/09/2007   COLONIC POLYPS, HX OF 03/09/2007   Hyperlipidemia 10/19/2006   GOUT 10/19/2006   Essential hypertension 10/19/2006   PCP:  [name], MD Pharmacy:   [facility], [address] [place] [address] Phone: [phone] Fax: [place] #[id number] Lorina [place], [place] - [address] [place] [address] Phone: [phone] Fax: [phone]     Social Determinants of Health ([place]) Interventions    Readmission Risk Interventions Readmission Risk Prevention Plan 04/21/2021  Transportation Screening Complete  PCP or Specialist Appt within 3-5 Days Complete  HRI or Home Care Consult Complete  Social Work Consult for [place] Planning/Counseling Complete  Palliative Care Screening Not Applicable  Medication Review Press photographer) Complete  Some recent data might be hidden

## 2021-05-08 NOTE — Progress Notes (Signed)
Transport called.

## 2021-05-08 NOTE — ED Notes (Signed)
No new orders received from provider - this RN will continue to monitor CBG Q2h per [place] and reassess transition at 0500 per provider.

## 2021-05-09 ENCOUNTER — Other Ambulatory Visit: Payer: Self-pay | Admitting: Internal Medicine

## 2021-05-09 DIAGNOSIS — I5032 Chronic diastolic (congestive) heart failure: Secondary | ICD-10-CM

## 2021-05-09 DIAGNOSIS — E1110 Type 2 diabetes mellitus with ketoacidosis without coma: Principal | ICD-10-CM

## 2021-05-09 DIAGNOSIS — E0810 Diabetes mellitus due to underlying condition with ketoacidosis without coma: Secondary | ICD-10-CM

## 2021-05-09 LAB — BASIC METABOLIC PANEL
Anion gap: 2 — ABNORMAL LOW (ref 5–15)
BUN: 15 mg/dL (ref 8–23)
CO2: 27 mmol/L (ref 22–32)
Calcium: 8.8 mg/dL — ABNORMAL LOW (ref 8.9–10.3)
Chloride: 112 mmol/L — ABNORMAL HIGH (ref 98–111)
Creatinine, Ser: 1.09 mg/dL (ref 0.61–1.24)
GFR, Estimated: >60 mL/min (ref >60)
Glucose, Bld: 83 mg/dL (ref 70–99)
Potassium: 3.9 mmol/L (ref 3.5–5.1)
Sodium: 141 mmol/L (ref 135–145)

## 2021-05-09 LAB — GLUCOSE, CAPILLARY: Glucose-Capillary: 116 mg/dL — ABNORMAL HIGH (ref 70–99)

## 2021-05-09 LAB — MAGNESIUM: Magnesium: 1.9 mg/dL (ref 1.7–2.4)

## 2021-05-09 LAB — PHOSPHORUS: Phosphorus: 2.4 mg/dL — ABNORMAL LOW (ref 2.5–4.6)

## 2021-05-09 MED ORDER — DONEPEZIL HCL 10 MG PO TABS
ORAL_TABLET | ORAL | 3 refills | Status: AC
Start: 2021-05-09 — End: ?

## 2021-05-09 NOTE — Progress Notes (Addendum)
Inpatient Diabetes Program Recommendations  AACE/ADA: New Consensus Statement on Inpatient Glycemic Control (2015)  Target Ranges:  Prepandial:   less than 140 mg/dL      Peak postprandial:   less than 180 mg/dL (1-2 hours)      Critically ill patients:  140 - 180 mg/dL   Lab Results  Component Value Date   GLUCAP 116 (H) 05/09/2021   HGBA1C 8.7 (H) 04/08/2021    Review of Glycemic Control  Latest Reference Range & Units 05/08/21 07:42 05/08/21 08:57 05/08/21 10:01 05/08/21 10:54 05/08/21 13:52 05/08/21 18:24 05/08/21 21:19 05/09/21 08:24  Glucose-Capillary 70 - 99 mg/dL 129 (H) 223 (H) 240 (H) 251 (H) 172 (H) 183 (H) 142 (H) 116 (H)  (H): Data is abnormally high  Inpatient Diabetes Program Recommendations:   Patient to be discharged today.  Thank you, [name]. Marthena Whitmyer, RN, MSN, CDE  Diabetes Coordinator Inpatient Glycemic Control Team Team Pager [phone] (8am-5pm) 05/09/2021 9:43 AM

## 2021-05-09 NOTE — Progress Notes (Unsigned)
{  Select_TRH_Note:[id number]} 

## 2021-05-09 NOTE — Progress Notes (Signed)
Received MD order to discharge patient to home, reviewed discharge instructions, home meds , follow-up appointments and prescriptions with patient and sister and both verbalized understanding.  Patient said he cell phone was already sent home and he did not have any other electronic devices with him at discharge.

## 2021-05-10 NOTE — Discharge Summary (Addendum)
Physician Discharge Summary   Patient: Allen Hunter. MRN: [id number] DOB: Dec 16, 1940  Admit date:     05/07/2021  Discharge date: 05/09/2021  Discharge Physician: Max Sane   PCP: [name], MD   Recommendations at discharge:   Follow-up with outpatient providers as requested  Discharge Diagnoses: Principal Problem:   DKA (diabetic ketoacidosis) ([place]) Active Problems:   Diabetes ([place])   GOUT   COPD (chronic obstructive pulmonary disease) (HCC)   AKI (acute kidney injury) [facility])    Hospital Course: 81 y.o. male with medical history significant of IDDM, COPD, BPH, mild cognitive impairment, gout, HLD, HTN, presented with feeling nauseous, poor oral intake and generalized weakness for 2 days.  2/16: DKA resolved and transition to subcu insulin  Assessment and Plan: * DKA (diabetic ketoacidosis) ([place])- (present on admission) Treated with insulin drip and transition to subcu insulin.  Outpatient follow-up with providers further adjustment in diabetic medication  AKI (acute kidney injury) ([place])- (present on admission) Resolved with hydration likely prerenal  COPD (chronic obstructive pulmonary disease) ([place])- (present on admission) Stable.  Continue Singulair and albuterol nebulizer as needed  GOUT- (present on admission) Continue allopurinol  Diabetes ([place]) Noncompliance is an issue.  Hemoglobin A1c 8.7           Consultants: Diabetic nurse Disposition: Home health Diet recommendation:  Discharge Diet Orders (From admission, onward)     Start     Ordered   05/09/21 0000  Diet - low sodium heart healthy        05/09/21 0817           Carb modified diet  DISCHARGE MEDICATION: Allergies as of 05/09/2021   No Known Allergies      Medication List     STOP taking these medications    CENTRUM MEN PO   mirabegron ER 25 MG Tb24 tablet Commonly known as: MYRBETRIQ   Trelegy Ellipta 100-62.5-25 MCG/ACT Aepb Generic drug:  Fluticasone-Umeclidin-Vilant       TAKE these medications    allopurinol 300 MG tablet Commonly known as: ZYLOPRIM Take 1 tablet (300 mg total) by mouth daily.   aspirin 81 MG tablet Take 81 mg by mouth daily.   atorvastatin 80 MG tablet Commonly known as: LIPITOR Take 1 tablet (80 mg total) by mouth daily.   bisoprolol 5 MG tablet Commonly known as: ZEBETA Take 1 tablet (5 mg total) by mouth daily.   blood glucose meter kit and supplies Dispense based on patient and insurance preference. Use up to four times daily as directed. (FOR ICD-10 E10.9, E11.9).   donepezil 10 MG tablet Commonly known as: ARICEPT Take 1 tablet(64m) by mouth daily. What changed: additional instructions   esomeprazole 40 MG capsule Commonly known as: NEXIUM Take 1 capsule (40 mg total) by mouth daily.   Fish Oil 500 MG Caps Take 1 capsule by mouth daily.   FREESTYLE LITE test strip Generic drug: glucose blood USE 1 STRIP TWICE A DAY AS NEEDED TO CHECK SUGAR   glipiZIDE 5 MG tablet Commonly known as: GLUCOTROL TAKE ONE-HALF TABLET BY MOUTH TWO TIMES A DAY FOR DIABETES. TAKE 30 MINUTES BEFORE MEAL(S)   insulin glargine 100 UNIT/ML Solostar Pen Commonly known as: LANTUS Inject 30 Units into the skin daily. What changed:  how much to take when to take this   Lancets Misc Use asd 1 per day 250.02   losartan 25 MG tablet Commonly known as: COZAAR Take 1 tablet (25 mg total) by mouth daily.  metFORMIN 1000 MG tablet Commonly known as: GLUCOPHAGE Take 1 tablet (1,000 mg total) by mouth 2 (two) times daily with a meal.   montelukast 10 MG tablet Commonly known as: SINGULAIR Take 1 tablet (10 mg total) by mouth at bedtime.   solifenacin 5 MG tablet Commonly known as: VESICARE Take 1 tablet (5 mg total) by mouth daily.   tamsulosin 0.4 MG Caps capsule Commonly known as: Flomax Take 1 capsule (0.4 mg total) by mouth daily.        Follow-up Information     Nahser, Wonda Cheng,  MD. Schedule an appointment as soon as possible for a visit in 2 week(s).   Specialty: Cardiology Why: [facility] Discharge F/UP Contact information: [place] [address] [phone]         [name], MD .   Specialties: Internal Medicine, Radiology Contact information: [place] [address] [phone]                 Discharge Exam: [name] Weights   05/07/21 1545  Weight: 78.9 kg   Eyes: PERRL, lids and conjunctivae normal Neck: normal, supple, no masses, no thyromegaly Respiratory: clear to auscultation bilaterally, no wheezing, no crackles. Normal respiratory effort. No accessory muscle use.  Cardiovascular: Regular rate and rhythm, no murmurs / rubs / gallops. No extremity edema. 2+ pedal pulses. No carotid bruits.  Abdomen: no tenderness, no masses palpated. No hepatosplenomegaly. Bowel sounds positive.  Musculoskeletal: no clubbing / cyanosis. No joint deformity upper and lower extremities. Good ROM, no contractures. Normal muscle tone.  Skin: no rashes, lesions, ulcers. No induration Neurologic: CN 2-12 grossly intact. Sensation intact, DTR normal. Strength 5/5 in all 4.  Psychiatric: Normal judgment and insight. Alert and oriented x 3. Normal mood.   Condition at discharge: good  The results of significant diagnostics from this hospitalization (including imaging, microbiology, ancillary and laboratory) are listed below for reference.   Imaging Studies: DG Chest 2 View  Result Date: 04/18/2021 CLINICAL DATA:  Short of breath and cough for 1 week EXAM: CHEST - 2 VIEW COMPARISON:  07/20/2020 FINDINGS: Frontal and lateral views of the chest demonstrate an unremarkable cardiac silhouette. No airspace disease, effusion, or pneumothorax. No acute bony abnormality. IMPRESSION: 1. No acute intrathoracic process. Electronically Signed   By: Randa Ngo M.D.   On: 04/18/2021 17:31   CT HEAD WO CONTRAST  (5MM)  Result Date: 04/18/2021 CLINICAL DATA:  Altered level of consciousness, shortness of breath and cough EXAM: CT HEAD WITHOUT CONTRAST TECHNIQUE: Contiguous axial images were obtained from the base of the skull through the vertex without intravenous contrast. RADIATION DOSE REDUCTION: This exam was performed according to the departmental dose-optimization program which includes automated exposure control, adjustment of the mA and/or kV according to patient size and/or use of iterative reconstruction technique. COMPARISON:  07/20/2020, 09/24/2020 FINDINGS: Brain: No acute infarct or hemorrhage. Stable cerebral atrophy. Lateral ventricles and midline structures are unremarkable. No acute extra-axial fluid collections. No mass effect. Vascular: No hyperdense vessel or unexpected calcification. Skull: Normal. Negative for fracture or focal lesion. Stable sebaceous cyst right parietal scalp. Sinuses/Orbits: No acute finding. Other: None. IMPRESSION: 1. Stable head CT, no acute intracranial process. Electronically Signed   By: Randa Ngo M.D.   On: 04/18/2021 23:23    Microbiology: Results for orders placed or performed during the hospital encounter of 05/07/21  Resp Panel by RT-PCR (Flu A&B, Covid) Nasopharyngeal Swab  Status: None   Collection Time: 05/07/21  3:51 PM   Specimen: Nasopharyngeal Swab; Nasopharyngeal(NP) swabs in vial transport medium  Result Value Ref Range Status   SARS Coronavirus 2 by RT PCR NEGATIVE NEGATIVE Final    Comment: (NOTE) SARS-CoV-2 target nucleic acids are NOT DETECTED.  The SARS-CoV-2 RNA is generally detectable in upper respiratory specimens during the acute phase of infection. The lowest concentration of SARS-CoV-2 viral copies this assay can detect is 138 copies/mL. A negative result does not preclude SARS-Cov-2 infection and should not be used as the sole basis for treatment or other patient management decisions. A negative result may occur with   improper specimen collection/handling, submission of specimen other than nasopharyngeal swab, presence of viral mutation(s) within the areas targeted by this assay, and inadequate number of viral copies(<138 copies/mL). A negative result must be combined with clinical observations, patient history, and epidemiological information. The expected result is Negative.  Fact Sheet for Patients:  EntrepreneurPulse.com.au  Fact Sheet for Healthcare Providers:  IncredibleEmployment.be  This test is no t yet approved or cleared by the [place] FDA and  has been authorized for detection and/or diagnosis of SARS-CoV-2 by FDA under an Emergency Use Authorization (EUA). This EUA will remain  in effect (meaning this test can be used) for the duration of the COVID-19 declaration under Section 564(b)(1) of the Act, 21 U.S.C.section 360bbb-3(b)(1), unless the authorization is terminated  or revoked sooner.       Influenza A by PCR NEGATIVE NEGATIVE Final   Influenza B by PCR NEGATIVE NEGATIVE Final    Comment: (NOTE) The Xpert Xpress SARS-CoV-2/FLU/RSV plus assay is intended as an aid in the diagnosis of influenza from Nasopharyngeal swab specimens and should not be used as a sole basis for treatment. Nasal washings and aspirates are unacceptable for Xpert Xpress SARS-CoV-2/FLU/RSV testing.  Fact Sheet for Patients: EntrepreneurPulse.com.au  Fact Sheet for Healthcare Providers: IncredibleEmployment.be  This test is not yet approved or cleared by the [place] FDA and has been authorized for detection and/or diagnosis of SARS-CoV-2 by FDA under an Emergency Use Authorization (EUA). This EUA will remain in effect (meaning this test can be used) for the duration of the COVID-19 declaration under Section 564(b)(1) of the Act, 21 U.S.C. section 360bbb-3(b)(1), unless the authorization is terminated  or revoked.  Performed at [facility], [address] [place], [place] [address]     Labs: CBC: Recent Labs  Lab 05/07/21 1551  WBC 4.1  NEUTROABS 2.7  HGB 13.5  HCT 44.4  MCV 97.8  PLT 628   Basic Metabolic Panel: Recent Labs  Lab 05/07/21 2031 05/08/21 0034 05/08/21 0454 05/08/21 1105 05/09/21 0339  NA 136 135 137 135 141  K 4.2 3.9 3.7 3.6 3.9  CL 107 108 110 109 112*  CO2 20* 21* _0 GLUCOSE 196* 259* 181* 261* 83  BUN _1 CREATININE 1.54* 1.25* 1.14 1.10 1.09  CALCIUM 9.1 8.8* 8.7* 8.6* 8.8*  MG 1.7  --   --   --  1.9  PHOS 1.7*  --   --   --  2.4*   Liver Function Tests: Recent Labs  Lab 05/07/21 1551  AST 16  ALT 24  ALKPHOS 70  BILITOT 1.1  PROT 6.6  ALBUMIN 3.6   CBG: Recent Labs  Lab 05/08/21 1054 05/08/21 1352 05/08/21 1824 05/08/21 2119 05/09/21 0824  GLUCAP 251* 172* 183* 142* 116*    Discharge time  spent: greater than 30 minutes.  Signed: Max Sane, MD [facility] 05/10/2021

## 2021-05-12 ENCOUNTER — Telehealth: Payer: Self-pay

## 2021-05-12 ENCOUNTER — Telehealth: Payer: Self-pay | Admitting: Internal Medicine

## 2021-05-12 NOTE — Telephone Encounter (Signed)
Ok for verbal 

## 2021-05-12 NOTE — Telephone Encounter (Addendum)
Transition Care Management Unsuccessful Follow-up Telephone Call  Date of discharge and from where:TCM DC [facility] 05-09-21 Dx: DKA    Attempts:  1st Attempt  Reason for unsuccessful TCM follow-up call:  Unable to leave message  Transition Care Management Follow-up Telephone Call Date of discharge and from where: TCM DC [facility] 05-09-21 Dx: DKA How have you been since you were released from the hospital? Feeling ok Any questions or concerns? No  Items Reviewed: Did the pt receive and understand the discharge instructions provided? Yes  Medications obtained and verified? Yes  Other? No  Any new allergies since your discharge? No  Dietary orders reviewed? Yes Do you have support at home? Yes   Home Care and Equipment/Supplies: Were home health services ordered? yes If so, what is the name of the agency? [facility] Has the agency set up a time to come to the patient's home? yes Were any new equipment or medical supplies ordered?  no What is the name of the medical supply agency? na Were you able to get the supplies/equipment? not applicable Do you have any questions related to the use of the equipment or supplies? No  Functional Questionnaire: (I = Independent and D = Dependent) ADLs: I  Bathing/Dressing- I  Meal Prep- I  Eating- I  Maintaining continence- I  Transferring/Ambulation- I  Managing Meds- I  Follow up appointments reviewed:  PCP Hospital f/u appt confirmed? Yes  Scheduled to see Dr Jenny Reichmann on 05-16-21 @ [facility] f/u appt confirmed? No . Are transportation arrangements needed? No  If their condition worsens, is the pt aware to call PCP or go to the Emergency Dept.? Yes Was the patient provided with contact information for the PCP's office or ED? Yes Was to pt encouraged to call back with questions or concerns? Yes

## 2021-05-12 NOTE — Telephone Encounter (Signed)
Pt had been in [place]. Received orders to resume care, was put on.   Family requesting it to be resumed next weekend, as they want to be in attendance- as they feel pt will not be able to answer for himself.  Please callback to give "okay"

## 2021-05-13 ENCOUNTER — Ambulatory Visit (INDEPENDENT_AMBULATORY_CARE_PROVIDER_SITE_OTHER): Payer: Medicare HMO | Admitting: *Deleted

## 2021-05-13 ENCOUNTER — Ambulatory Visit: Payer: Self-pay

## 2021-05-13 DIAGNOSIS — E1165 Type 2 diabetes mellitus with hyperglycemia: Secondary | ICD-10-CM

## 2021-05-13 DIAGNOSIS — I10 Essential (primary) hypertension: Secondary | ICD-10-CM

## 2021-05-13 NOTE — Telephone Encounter (Signed)
Verbal given 

## 2021-05-13 NOTE — Chronic Care Management (AMB) (Signed)
Chronic Care Management   CCM RN Visit Note  05/13/2021 Name: Allen Hunter. MRN: [id number] DOB: 01-Apr-1940  Subjective: Allen Hunter. is a 81 y.o. year old male who is a primary care patient of [name], MD. The care management team was consulted for assistance with disease management and care coordination needs.    Engaged with patient by telephone for initial visit in response to provider referral for case management and/or care coordination services.   Consent to Services:  The patient was given information about Chronic Care Management services, agreed to services, and gave verbal consent 04/30/21 prior to initiation of services.  Please see initial visit note for detailed documentation.  Patient agreed to services and verbal consent obtained.   Assessment: Review of patient past medical history, allergies, medications, health status, including review of consultants reports, laboratory and other test data, was performed as part of comprehensive evaluation and provision of chronic care management services.   SDOH (Social Determinants of Health) assessments and interventions performed:  SDOH Interventions    Flowsheet Row Most Recent Value  SDOH Interventions   Food Insecurity Interventions Intervention Not Indicated  [denies food insecurity,  states does not get assistance]  Housing Interventions Intervention Not Indicated  [Single family home x 25 years, one level, lives alone,  denies safety concerns around home structure]  Transportation Interventions Intervention Not Indicated  [drives self,  family can assists occasionally]      CCM Care Plan No Known Allergies  Outpatient Encounter Medications as of 05/13/2021  Medication Sig   allopurinol (ZYLOPRIM) 300 MG tablet Take 1 tablet (300 mg total) by mouth daily.   aspirin 81 MG tablet Take 81 mg by mouth daily.   atorvastatin (LIPITOR) 80 MG tablet Take 1 tablet (80 mg total) by mouth daily.   bisoprolol (ZEBETA) 5  MG tablet Take 1 tablet (5 mg total) by mouth daily.   blood glucose meter kit and supplies Dispense based on patient and insurance preference. Use up to four times daily as directed. (FOR ICD-10 E10.9, E11.9).   donepezil (ARICEPT) 10 MG tablet Take 1 tablet(40m) by mouth daily.   esomeprazole (NEXIUM) 40 MG capsule Take 1 capsule (40 mg total) by mouth daily.   glipiZIDE (GLUCOTROL) 5 MG tablet TAKE ONE-HALF TABLET BY MOUTH TWO TIMES A DAY FOR DIABETES. TAKE 30 MINUTES BEFORE MEAL(S)   glucose blood (FREESTYLE LITE) test strip USE 1 STRIP TWICE A DAY AS NEEDED TO CHECK SUGAR   insulin glargine (LANTUS) 100 UNIT/ML Solostar Pen Inject 30 Units into the skin daily. (Patient taking differently: Inject 34 Units into the skin at bedtime.)   Lancets MISC Use asd 1 per day 250.02   losartan (COZAAR) 25 MG tablet Take 1 tablet (25 mg total) by mouth daily.   metFORMIN (GLUCOPHAGE) 1000 MG tablet Take 1 tablet (1,000 mg total) by mouth 2 (two) times daily with a meal.   montelukast (SINGULAIR) 10 MG tablet Take 1 tablet (10 mg total) by mouth at bedtime.   Omega-3 Fatty Acids (FISH OIL) 500 MG CAPS Take 1 capsule by mouth daily.   solifenacin (VESICARE) 5 MG tablet Take 1 tablet (5 mg total) by mouth daily.   tamsulosin (FLOMAX) 0.4 MG CAPS capsule Take 1 capsule (0.4 mg total) by mouth daily.   No facility-administered encounter medications on file as of 05/13/2021.   Patient Active Problem List   Diagnosis Date Noted   AKI (acute kidney injury) ([place] 05/08/2021  Acute metabolic encephalopathy 90/30/0923   Exacerbation of asthma 11/17/2020   OAB (overactive bladder) 11/17/2020   Mild cognitive impairment 08/01/2020   DKA (diabetic ketoacidosis) ([place]) 07/20/2020   Hyponatremia 07/20/2020   Dehydration 07/20/2020   Constipation 05/10/2019   Chronic diastolic CHF (congestive heart failure) ([place]) 11/15/2018   DOE (dyspnea on exertion) 05/17/2018   OSA (obstructive sleep apnea) 07/19/2015    Asthma with acute exacerbation 04/10/2014   Unspecified asthma, with exacerbation 09/05/2013   GERD (gastroesophageal reflux disease) 03/01/2012   Diarrhea 03/01/2012   Hypersomnolence 01/19/2012   Prostate cancer ([place]) 01/19/2012   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   Increased prostate specific antigen (PSA) velocity 01/21/2011   Erectile dysfunction 01/20/2011   Bladder neck obstruction 01/20/2011   Encounter for well adult exam with abnormal findings 01/18/2011   Localized swelling, mass and lump, head 01/09/2010   Allergic rhinitis 10/04/2008   Asthma 10/04/2008   BACK PAIN 04/18/2008   COPD (chronic obstructive pulmonary disease) ([place]) 10/20/2007   Fatigue 10/05/2007   Diabetes ([place]) 03/09/2007   BPH (benign prostatic hyperplasia) 03/09/2007   COLONIC POLYPS, HX OF 03/09/2007   Hyperlipidemia 10/19/2006   GOUT 10/19/2006   Essential hypertension 10/19/2006   Conditions to be addressed/monitored:  CHF and DMII  Care Plan : RN CM Plan of Care  Updates made by Knox Royalty, RN since 05/13/2021 12:00 AM     Problem: Chronic Disease Management Needs   Priority: High     Long-Range Goal: Development of plan of care for long term chronic disease management   Start Date: 05/13/2021  Expected End Date: 05/13/2022  Priority: High  Note:   Current Barriers:  Chronic Disease Management support and education needs related to CHF and DMII Element of dementia: currently followed by neuropsych provider and neurology provider Multiple recent hospitalization for DKA January 27-30, 2023: discharged home to self care with home health care services ([facility]) February 15-17, 2023: discharged to home/ self care  RNCM Clinical Goal(s):  Patient will demonstrate improved health management independence as evidenced by adherence to plan of care for self-health management of DMII and CHF        through collaboration with RN Care manager, provider, and care team.   Interventions: 1:1  collaboration with primary care provider regarding development and update of comprehensive plan of care as evidenced by provider attestation and co-signature Inter-disciplinary care team collaboration (see longitudinal plan of care) Evaluation of current treatment plan related to  self management and patient's adherence to plan as established by provider 05/13/21: Initial assessment completed SDOH assessment completed: no unmet needs/ concerns, per patient report today Pain assessment completed: patient denies acute/ chronic pain Falls assessment completed: patient denies new/ recent falls x 12 months- reports feels "pretty steady" on feet" with ambulation, confirms does not use assistive devices; fall risks and prevention education provided/ discussed Briefly reviewed recent hospitalizations Brief general medication review completed: patient declines full review of medications; reports self-manages medications, including insulin; denies concerns/ issues/ problems around medications and endorses adherence to medication regimen as prescribed  Heart Failure Interventions:  (Status: 05/13/21: New goal.)  Long Term Goal  Wt Readings from Last 3 Encounters:  05/07/21 174 lb (78.9 kg)  04/21/21 174 lb 2.6 oz (79 kg)  04/08/21 180 lb (81.6 kg)  Basic overview and discussion of pathophysiology of Heart Failure reviewed Assessed need for readable accurate scales in home Advised patient to weigh each morning after emptying bladder Discussed importance of daily weight and advised  patient to weigh and record daily Reviewed role of diuretics in prevention of fluid overload and management of heart failure Discussed the importance of keeping all appointments with provider Confirmed patient monitors daily weights but does not record: he states he is monitoring "to stay on top of my weight levels;" discussed rationale/ purpose for weight monitoring at home for assessment of fluid retention; today he reports  weights "around 200 lbs"-- encouraged him to begin recording weights on paper at home for our ongoing review together; he states he will consider Initiated education around weight gain guidelines in setting of CHF, along with corresponding action plan Confirmed patient reports no clinical concerns today around breathing status- reports at baseline Initiated education around signs/ symptoms CHF yellow zone, along with corresponding action plan  Diabetes:  (Status: 05/13/21: New goal.) Long Term Goal   Lab Results  Component Value Date   HGBA1C 8.7 (H) 04/08/2021    Assessed patient's understanding of A1c goal: <7% Provided education to patient about basic DM disease process; Reviewed prescribed diet with patient heart healthy, low cholesterol, low salt, low sugar, low carbohydrate diet; Counseled on importance of regular laboratory monitoring as prescribed;        Discussed plans with patient for ongoing care management follow up and provided patient with direct contact information for care management team;      Reviewed scheduled/upcoming provider appointments including: 05/16/21- PCP; 06/04/21 and 06/11/21- neuropsych provider Melvyn Novas); 06/17/21- neurologist;         Review of patient status, including review of consultants reports, relevant laboratory and other test results, and medications completed;       Confirmed patient monitors daily fasting blood sugars: he reports "they are all very high, all the time, usually higher than 300" Does not record blood sugars at home: encouraged to start doing so for our review together Confirmed patient endorses adherence to long acting insulin and medication regimen: he initially reports that he is taking 42 Units QD-- when we reviewed recent hospital discharge summary, I noted instructions to take 30 units QD-- he then reported that he is actually taking "34 units" QD, "usually before eating lunch Discussed current diet: reports he eats all meals out usually--  reports goes to "some fast food place," like "[employer] and sometimes to [employer];" reports does not drink sugary liquids and tries to get plenty of vegetables each week; initiated education around strategies he could begin to take to follow prescribed diet: he is not very interested at this time Assessed patient's understanding of meaning/ significance of A1-C values: fair baseline understanding of same- will require/ benefit from ongoing support/ education/ reinforcement  Patient Goals/Self-Care Activities: As evidenced by review of EHR, collaboration with care team, and patient reporting during CCM RN CM outreach, Patient Sam will: Take medications as prescribed Attend all scheduled provider appointments Call pharmacy for medication refills Call provider office for new concerns or questions Continue to check fasting (first thing in the morning, before eating) blood sugars at home-- please write these down on paper so we can review them next time we talk on the phone Continue to check your weights at home every day: please write these down on paper so we can review them during our future phone calls Call your doctor is you notice overnight weight gain greater than 3-5 lbs: this is often the earliest sign of fluid retention, and could cause your breathing to be difficult if you do not take action Continue efforts to follow heart healthy, low  salt, low cholesterol, carbohydrate-modified, low sugar diet Keep up the great work preventing falls  Review enclosed educational material about Diabetes and Heart Failure      Plan: Telephone follow up appointment with care management team member scheduled for:  Thursday May 22, 2021 at 11:30 am The patient has been provided with contact information for the care management team and has been advised to call with any health related questions or concerns  [name], RN, BSN, [place] [phone]: direct office

## 2021-05-13 NOTE — Patient Instructions (Signed)
Visit Information   Allen Hunter, thank you for taking time to talk with me today. Please don't hesitate to contact me if I can be of assistance to you before our next scheduled telephone appointment.  Below are the goals we discussed today:  Patient Self-Care Activities: Patient Allen Hunter will: Take medications as prescribed Attend all scheduled provider appointments Call pharmacy for medication refills Call provider office for new concerns or questions Continue to check fasting (first thing in the morning, before eating) blood sugars at home-- please write these down on paper so we can review them next time we talk on the phone Continue to check your weights at home every day: please write these down on paper so we can review them during our future phone calls Call your doctor is you notice overnight weight gain greater than 3-5 lbs: this is often the earliest sign of fluid retention, and could cause your breathing to be difficult if you do not take action Continue efforts to follow heart healthy, low salt, low cholesterol, carbohydrate-modified, low sugar diet Keep up the great work preventing falls  Review enclosed educational material about Diabetes and Heart Failure  Our next scheduled telephone follow up visit/ appointment with care management team member is scheduled on:   Thursday, May 22, 2021 at 11:30 am- This is a PHONE [place] appointment  If you need to cancel or re-schedule our visit, please call [phone] and our care guide team will be happy to assist you.   I look forward to hearing about your progress.   [name], RN, BSN, [place] [phone]: direct office  If you are experiencing a Mental Health or [place] or need someone to talk to, please  call the Suicide and Crisis Lifeline: 988 call the [place] National Suicide Prevention Lifeline: 208 379 9428 or TTY: 418-019-8375 TTY (682)220-3129) to  talk to a trained counselor call 1-800-273-TALK (toll free, 24 hour hotline) go to [facility] [address] [place] [phone]) call 911   Heart Failure Action Plan A heart failure action plan helps you understand what to do when you have symptoms of heart failure. Your action plan is a color-coded plan that lists the symptoms to watch for and indicates what actions to take. If you have symptoms in the red zone, you need medical care right away. If you have symptoms in the yellow zone, you are having problems. If you have symptoms in the green zone, you are doing well. Follow the plan that was created by you and your health care provider. Review your plan each time you visit your health care provider. Red zone These signs and symptoms mean you should get medical help right away: You have trouble breathing when resting. You have a dry cough that is getting worse. You have swelling or pain in your legs or abdomen that is getting worse. You suddenly gain more than 2-3 lb (0.9-1.4 kg) in 24 hours, or more than 5 lb (2.3 kg) in a week. This amount may be more or less depending on your condition. You have trouble staying awake or you feel confused. You have chest pain. You do not have an appetite. You pass out. You have worsening sadness or depression. If you have any of these symptoms, call your local emergency services (911 in the [place] right away. Do not drive yourself to the hospital. Yellow zone These signs and symptoms mean your condition may be getting  worse and you should make some changes: You have trouble breathing when you are active, or you need to sleep with your head raised on extra pillows to help you breathe. You have swelling in your legs or abdomen. You gain 2-3 lb (0.9-1.4 kg) in 24 hours, or 5 lb (2.3 kg) in a week. This amount may be more or less depending on your condition. You get tired easily. You have trouble sleeping. You  have a dry cough. If you have any of these symptoms: Contact your health care provider within the next day. Your health care provider may adjust your medicines. Green zone These signs mean you are doing well and can continue what you are doing: You do not have shortness of breath. You have very little swelling or no new swelling. Your weight is stable (no gain or loss). You have a normal activity level. You do not have chest pain or any other new symptoms. Follow these instructions at home: Take over-the-counter and prescription medicines only as told by your health care provider. Weigh yourself daily. Your target weight is __________ lb (__________ kg). Call your health care provider if you gain more than __________ lb (__________ kg) in 24 hours, or more than __________ lb (__________ kg) in a week. Health care provider name: _____________________________________________________ Health care provider phone number: _____________________________________________________ Eat a heart-healthy diet. Work with a diet and nutrition specialist (dietitian) to create an eating plan that is best for you. Keep all follow-up visits. This is important. Where to find more information American Heart Association: www.heart.org Summary A heart failure action plan helps you understand what to do when you have symptoms of heart failure. Follow the action plan that was created by you and your health care provider. Get help right away if you have any symptoms in the red zone. This information is not intended to replace advice given to you by your health care provider. Make sure you discuss any questions you have with your health care provider. Document Revised: 10/23/2019 Document Reviewed: 10/23/2019 Elsevier Patient Education  2022 [employer].   Following is a copy of your full care plan:  Care Plan : RN CM Plan of Care  Updates made by Knox Royalty, RN since 05/13/2021 12:00 AM     Problem:  Chronic Disease Management Needs   Priority: High     Long-Range Goal: Development of plan of care for long term chronic disease management   Start Date: 05/13/2021  Expected End Date: 05/13/2022  Priority: High  Note:   Current Barriers:  Chronic Disease Management support and education needs related to CHF and DMII Element of dementia: currently followed by neuropsych provider and neurology provider Multiple recent hospitalization for DKA January 27-30, 2023: discharged home to self care with home health care services ([facility]) February 15-17, 2023: discharged to home/ self care  RNCM Clinical Goal(s):  Patient will demonstrate improved health management independence as evidenced by adherence to plan of care for self-health management of DMII and CHF        through collaboration with RN Care manager, provider, and care team.   Interventions: 1:1 collaboration with primary care provider regarding development and update of comprehensive plan of care as evidenced by provider attestation and co-signature Inter-disciplinary care team collaboration (see longitudinal plan of care) Evaluation of current treatment plan related to  self management and patient's adherence to plan as established by provider 05/13/21: Initial assessment completed SDOH assessment completed: no unmet needs/ concerns, per patient  report today Pain assessment completed: patient denies acute/ chronic pain Falls assessment completed: patient denies new/ recent falls x 12 months- reports feels "pretty steady" on feet" with ambulation, confirms does not use assistive devices; fall risks and prevention education provided/ discussed Briefly reviewed recent hospitalizations Brief general medication review completed: patient declines full review of medications; reports self-manages medications, including insulin; denies concerns/ issues/ problems around medications and endorses adherence to medication regimen as  prescribed  Heart Failure Interventions:  (Status: 05/13/21: New goal.)  Long Term Goal  Wt Readings from Last 3 Encounters:  05/07/21 174 lb (78.9 kg)  04/21/21 174 lb 2.6 oz (79 kg)  04/08/21 180 lb (81.6 kg)  Basic overview and discussion of pathophysiology of Heart Failure reviewed Assessed need for readable accurate scales in home Advised patient to weigh each morning after emptying bladder Discussed importance of daily weight and advised patient to weigh and record daily Reviewed role of diuretics in prevention of fluid overload and management of heart failure Discussed the importance of keeping all appointments with provider Confirmed patient monitors daily weights but does not record: he states he is monitoring "to stay on top of my weight levels;" discussed rationale/ purpose for weight monitoring at home for assessment of fluid retention; today he reports weights "around 200 lbs"-- encouraged him to begin recording weights on paper at home for our ongoing review together; he states he will consider Initiated education around weight gain guidelines in setting of CHF, along with corresponding action plan Confirmed patient reports no clinical concerns today around breathing status- reports at baseline Initiated education around signs/ symptoms CHF yellow zone, along with corresponding action plan  Diabetes:  (Status: 05/13/21: New goal.) Long Term Goal   Lab Results  Component Value Date   HGBA1C 8.7 (H) 04/08/2021    Assessed patient's understanding of A1c goal: <7% Provided education to patient about basic DM disease process; Reviewed prescribed diet with patient heart healthy, low cholesterol, low salt, low sugar, low carbohydrate diet; Counseled on importance of regular laboratory monitoring as prescribed;        Discussed plans with patient for ongoing care management follow up and provided patient with direct contact information for care management team;      Reviewed  scheduled/upcoming provider appointments including: 05/16/21- PCP; 06/04/21 and 06/11/21- neuropsych provider Melvyn Novas); 06/17/21- neurologist;         Review of patient status, including review of consultants reports, relevant laboratory and other test results, and medications completed;       Confirmed patient monitors daily fasting blood sugars: he reports "they are all very high, all the time, usually higher than 300" Does not record blood sugars at home: encouraged to start doing so for our review together Confirmed patient endorses adherence to long acting insulin and medication regimen: he initially reports that he is taking 42 Units QD-- when we reviewed recent hospital discharge summary, I noted instructions to take 30 units QD-- he then reported that he is actually taking "34 units" QD, "usually before eating lunch Discussed current diet: reports he eats all meals out usually-- reports goes to "some fast food place," like "[employer] and sometimes to [employer];" reports does not drink sugary liquids and tries to get plenty of vegetables each week; initiated education around strategies he could begin to take to follow prescribed diet: he is not very interested at this time Assessed patient's understanding of meaning/ significance of A1-C values: fair baseline understanding of same- will require/ benefit from ongoing  support/ education/ reinforcement  Patient Goals/Self-Care Activities: As evidenced by review of EHR, collaboration with care team, and patient reporting during CCM RN CM outreach, Patient Allen Hunter will: Take medications as prescribed Attend all scheduled provider appointments Call pharmacy for medication refills Call provider office for new concerns or questions Continue to check fasting (first thing in the morning, before eating) blood sugars at home-- please write these down on paper so we can review them next time we talk on the phone Continue to check your weights at home every day:  please write these down on paper so we can review them during our future phone calls Call your doctor is you notice overnight weight gain greater than 3-5 lbs: this is often the earliest sign of fluid retention, and could cause your breathing to be difficult if you do not take action Continue efforts to follow heart healthy, low salt, low cholesterol, carbohydrate-modified, low sugar diet Keep up the great work preventing falls  Review enclosed educational material about Diabetes and Heart Failure      Consent to CCM Services: Mr. Mcwherter was given information about Chronic Care Management services 04/30/21 including:  CCM service includes personalized support from designated clinical staff supervised by his physician, including individualized plan of care and coordination with other care providers 24/7 contact phone numbers for assistance for urgent and routine care needs. Service will only be billed when office clinical staff spend 20 minutes or more in a month to coordinate care. Only one practitioner may furnish and bill the service in a calendar month. The patient may stop CCM services at any time (effective at the end of the month) by phone call to the office staff. The patient will be responsible for cost sharing (co-pay) of up to 20% of the service fee (after annual deductible is met).  Patient agreed to services and verbal consent obtained.   The patient verbalized understanding of instructions, educational materials, and care plan provided today and agreed to receive a mailed copy of patient instructions, educational materials, and care plan  Telephone follow up appointment with care management team member scheduled for:  Thursday, May 22, 2021 at 11:30 am The patient has been provided with contact information for the care management team and has been advised to call with any health related questions or concerns

## 2021-05-16 ENCOUNTER — Ambulatory Visit: Payer: Self-pay

## 2021-05-16 ENCOUNTER — Inpatient Hospital Stay: Payer: Medicare HMO | Admitting: Internal Medicine

## 2021-05-19 NOTE — Telephone Encounter (Addendum)
Allen Hunter is calling to get orders for 1 time a week for 6 weeks for nursing DM teaching.  They are just now catching up with the pt.  Tonya from [place]

## 2021-05-20 DIAGNOSIS — I10 Essential (primary) hypertension: Secondary | ICD-10-CM | POA: Diagnosis not present

## 2021-05-20 DIAGNOSIS — E1165 Type 2 diabetes mellitus with hyperglycemia: Secondary | ICD-10-CM

## 2021-05-20 NOTE — Telephone Encounter (Signed)
Ok for verbals 

## 2021-05-20 NOTE — Telephone Encounter (Signed)
Merry Proud from [facility] has called and is requesting verbals for social work, to Engineer, production in [employer].   Frequency- 1 month/1   Callback #- [phone]

## 2021-05-20 NOTE — Telephone Encounter (Signed)
Ok for verbal 

## 2021-05-20 NOTE — Telephone Encounter (Signed)
Called Tonya from [place] to relay verbals per Dr. Jenny Reichmann for Dm teaching.

## 2021-05-21 ENCOUNTER — Ambulatory Visit: Payer: Medicare HMO | Admitting: Internal Medicine

## 2021-05-21 NOTE — Telephone Encounter (Signed)
Call Merry Proud at [facility] to ok verbals for Social work / Scientist, clinical (histocompatibility and immunogenetics) resources per Dr.John .  ?

## 2021-05-22 ENCOUNTER — Ambulatory Visit (INDEPENDENT_AMBULATORY_CARE_PROVIDER_SITE_OTHER): Payer: Medicare HMO | Admitting: *Deleted

## 2021-05-22 ENCOUNTER — Telehealth: Payer: Self-pay

## 2021-05-22 DIAGNOSIS — E1165 Type 2 diabetes mellitus with hyperglycemia: Secondary | ICD-10-CM

## 2021-05-22 DIAGNOSIS — I10 Essential (primary) hypertension: Secondary | ICD-10-CM

## 2021-05-22 NOTE — Telephone Encounter (Signed)
Social worker visit will be delayed as Allen Hunter went out to complete the visit and he declined.  ?FYI ?

## 2021-05-22 NOTE — Chronic Care Management (AMB) (Signed)
Chronic Care Management   CCM RN Visit Note  05/22/2021 Name: Allen Hunter. MRN: [id number] DOB: [birth year]  Subjective: Allen Hunter. is a 81 y.o. year old male who is a primary care patient of [name], MD. The care management team was consulted for assistance with disease management and care coordination needs.    Engaged with patient by telephone for follow up visit in response to provider referral for case management and/or care coordination services.   Consent to Services:  The patient was given information about Chronic Care Management services, agreed to services, and gave verbal consent prior to initiation of services.  Please see initial visit note for detailed documentation.  Patient agreed to services and verbal consent obtained.   Assessment: Review of patient past medical history, allergies, medications, health status, including review of consultants reports, laboratory and other test data, was performed as part of comprehensive evaluation and provision of chronic care management services.  CCM Care Plan  No Known Allergies  Outpatient Encounter Medications as of 05/22/2021  Medication Sig   allopurinol (ZYLOPRIM) 300 MG tablet Take 1 tablet (300 mg total) by mouth daily.   aspirin 81 MG tablet Take 81 mg by mouth daily.   atorvastatin (LIPITOR) 80 MG tablet Take 1 tablet (80 mg total) by mouth daily.   bisoprolol (ZEBETA) 5 MG tablet Take 1 tablet (5 mg total) by mouth daily.   blood glucose meter kit and supplies Dispense based on patient and insurance preference. Use up to four times daily as directed. (FOR ICD-10 E10.9, E11.9).   donepezil (ARICEPT) 10 MG tablet Take 1 tablet(51m) by mouth daily.   esomeprazole (NEXIUM) 40 MG capsule Take 1 capsule (40 mg total) by mouth daily.   glipiZIDE (GLUCOTROL) 5 MG tablet TAKE ONE-HALF TABLET BY MOUTH TWO TIMES A DAY FOR DIABETES. TAKE 30 MINUTES BEFORE MEAL(S)   glucose blood (FREESTYLE LITE) test strip USE 1 STRIP  TWICE A DAY AS NEEDED TO CHECK SUGAR   insulin glargine (LANTUS) 100 UNIT/ML Solostar Pen Inject 30 Units into the skin daily. (Patient taking differently: Inject 34 Units into the skin at bedtime.)   Lancets MISC Use asd 1 per day 250.02   losartan (COZAAR) 25 MG tablet Take 1 tablet (25 mg total) by mouth daily.   metFORMIN (GLUCOPHAGE) 1000 MG tablet Take 1 tablet (1,000 mg total) by mouth 2 (two) times daily with a meal.   montelukast (SINGULAIR) 10 MG tablet Take 1 tablet (10 mg total) by mouth at bedtime.   Omega-3 Fatty Acids (FISH OIL) 500 MG CAPS Take 1 capsule by mouth daily.   solifenacin (VESICARE) 5 MG tablet Take 1 tablet (5 mg total) by mouth daily.   tamsulosin (FLOMAX) 0.4 MG CAPS capsule Take 1 capsule (0.4 mg total) by mouth daily.   No facility-administered encounter medications on file as of 05/22/2021.   Patient Active Problem List   Diagnosis Date Noted   AKI (acute kidney injury) ([place] 016/03/930  Acute metabolic encephalopathy 035/57/3220  Exacerbation of asthma 11/17/2020   OAB (overactive bladder) 11/17/2020   Mild cognitive impairment 08/01/2020   DKA (diabetic ketoacidosis) ([place] 07/20/2020   Hyponatremia 07/20/2020   Dehydration 07/20/2020   Constipation 05/10/2019   Chronic diastolic CHF (congestive heart failure) ([place] 11/15/2018   DOE (dyspnea on exertion) 05/17/2018   OSA (obstructive sleep apnea) 07/19/2015   Asthma with acute exacerbation 04/10/2014   Unspecified asthma, with exacerbation 09/05/2013   GERD (gastroesophageal reflux  disease) 03/01/2012   Diarrhea 03/01/2012   Hypersomnolence 01/19/2012   Prostate cancer ([place]) 01/19/2012   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   Increased prostate specific antigen (PSA) velocity 01/21/2011   Erectile dysfunction 01/20/2011   Bladder neck obstruction 01/20/2011   Encounter for well adult exam with abnormal findings 01/18/2011   Localized swelling, mass and lump, head 01/09/2010   Allergic rhinitis 10/04/2008    Asthma 10/04/2008   BACK PAIN 04/18/2008   COPD (chronic obstructive pulmonary disease) ([place]) 10/20/2007   Fatigue 10/05/2007   Diabetes ([place]) 03/09/2007   BPH (benign prostatic hyperplasia) 03/09/2007   COLONIC POLYPS, HX OF 03/09/2007   Hyperlipidemia 10/19/2006   GOUT 10/19/2006   Essential hypertension 10/19/2006   Conditions to be addressed/monitored:  CHF, HTN, and DMII  Care Plan : RN CM Plan of Care  Updates made by Knox Royalty, RN since 05/22/2021 12:00 AM     Problem: Chronic Disease Management Needs   Priority: High     Long-Range Goal: Development of plan of care for long term chronic disease management   Start Date: 05/13/2021  Expected End Date: 05/13/2022  Priority: High  Note:   Current Barriers:  Chronic Disease Management support and education needs related to CHF and DMII Element of dementia: currently followed by neuropsych provider and neurology provider Multiple recent hospitalization for DKA January 27-30, 2023: discharged home to self care with home health care services ([facility]) February 15-17, 2023: discharged to home/ self care  RNCM Clinical Goal(s):  Patient will demonstrate improved health management independence as evidenced by adherence to plan of care for self-health management of DMII and CHF        through collaboration with RN Care manager, provider, and care team.   Interventions: 1:1 collaboration with primary care provider regarding development and update of comprehensive plan of care as evidenced by provider attestation and co-signature Inter-disciplinary care team collaboration (see longitudinal plan of care) Evaluation of current treatment plan related to  self management and patient's adherence to plan as established by provider Initial assessment completed 05/13/21 05/22/21: Follow up outreach completed as previously scheduled with patient- he completely does not remember our scheduled call today Discussed with  patient his no-show to scheduled appointment with Dr. Jenny Reichmann yesterday: patient states he did not know he had an appointment, despite our recent review of all scheduled provider appointments on 05/13/21, at which time patient reported "all on my calendar" Reinforced role of CCM RN CM- he states he does not remember our call from 05/13/21 at all Attempted to review recent blood sugars and daily weights with patient: he reports fasting blood sugar this morning of "340" and states his weight is "240 lbs;" he had previously reported on 05/13/21 a general weight range of "200 lbs;" today he states, "I can't remember all of this stuff-- I don't know what my weight is;" Patient becomes agitated again during this conversation, as he did on 05/13/21-- today, he reports he is not at home and "can't deal with" the information I am trying to provide him with Reviewed our previous conversation from 05/13/21- patient reported then that he needed assistance "to make blood sugars lower;" encouraged him to promptly re-schedule yesterday's missed appointment with PCP, for review of ongoing blood sugar management at home, as his next PCP visit is not scheduled until 10/06/21 Attempted to clarify whether patient is/ is not taking insulin: he tells me he is taking, but is unable to provide dosing he is currently using Reviewed  with patient scheduled upcoming provider appointments with neurology-- he states he does not know why he is scheduled for these visits, "I guess they are for my bad memory" June 04, 2021 and June 11, 2021: neuropsych provider June 17, 2021: neurology provider Made patient aware that I will place a list of his appointments in the mail to him by USPS-- encouraged him to call if he has questions/ concerns  From last outreach 05/13/21:  Heart Failure Interventions:  (Status: 05/22/21: Goal on track: NO.)  Long Term Goal  Wt Readings from Last 3 Encounters:  05/07/21 174 lb (78.9 kg)  04/21/21 174 lb 2.6 oz  (79 kg)  04/08/21 180 lb (81.6 kg)  Basic overview and discussion of pathophysiology of Heart Failure reviewed Assessed need for readable accurate scales in home Advised patient to weigh each morning after emptying bladder Discussed importance of daily weight and advised patient to weigh and record daily Reviewed role of diuretics in prevention of fluid overload and management of heart failure Discussed the importance of keeping all appointments with provider Confirmed patient monitors daily weights but does not record: he states he is monitoring "to stay on top of my weight levels;" discussed rationale/ purpose for weight monitoring at home for assessment of fluid retention; today he reports weights "around 200 lbs"-- encouraged him to begin recording weights on paper at home for our ongoing review together; he states he will consider Initiated education around weight gain guidelines in setting of CHF, along with corresponding action plan Confirmed patient reports no clinical concerns today around breathing status- reports at baseline Initiated education around signs/ symptoms CHF yellow zone, along with corresponding action plan  Diabetes:  (Status: 05/22/21: Goal on track: NO.) Long Term Goal   Lab Results  Component Value Date   HGBA1C 8.7 (H) 04/08/2021    Assessed patient's understanding of A1c goal: <7% Provided education to patient about basic DM disease process; Reviewed prescribed diet with patient heart healthy, low cholesterol, low salt, low sugar, low carbohydrate diet; Counseled on importance of regular laboratory monitoring as prescribed;        Discussed plans with patient for ongoing care management follow up and provided patient with direct contact information for care management team;      Reviewed scheduled/upcoming provider appointments including: 05/16/21- PCP; 06/04/21 and 06/11/21- neuropsych provider Melvyn Novas); 06/17/21- neurologist;         Review of patient status,  including review of consultants reports, relevant laboratory and other test results, and medications completed;       Confirmed patient monitors daily fasting blood sugars: he reports "they are all very high, all the time, usually higher than 300" Does not record blood sugars at home: encouraged to start doing so for our review together Confirmed patient endorses adherence to long acting insulin and medication regimen: he initially reports that he is taking 42 Units QD-- when we reviewed recent hospital discharge summary, I noted instructions to take 30 units QD-- he then reported that he is actually taking "34 units" QD, "usually before eating lunch Discussed current diet: reports he eats all meals out usually-- reports goes to "some fast food place," like "[employer] and sometimes to [employer];" reports does not drink sugary liquids and tries to get plenty of vegetables each week; initiated education around strategies he could begin to take to follow prescribed diet: he is not very interested at this time Assessed patient's understanding of meaning/ significance of A1-C values: fair baseline understanding of same- will  require/ benefit from ongoing support/ education/ reinforcement  Patient Goals/Self-Care Activities: As evidenced by review of EHR, collaboration with care team, and patient reporting during CCM RN CM outreach,  Patient Sam will: Take medications as prescribed Attend all scheduled provider appointments Call pharmacy for medication refills Call provider office for new concerns or questions Continue to check fasting (first thing in the morning, before eating) blood sugars at home-- please write these down on paper so we can review them next time we talk on the phone Continue to check your weights at home every day: please write these down on paper so we can review them during our future phone calls Call your doctor is you notice overnight weight gain greater than 3-5 lbs: this is  often the earliest sign of fluid retention, and could cause your breathing to be difficult if you do not take action Continue efforts to follow heart healthy, low salt, low cholesterol, carbohydrate-modified, low sugar diet Keep up the great work preventing falls  Review the educational material about Diabetes and Heart Failure that I mailed to you on May 13, 2021 It appears you did not attend your scheduled appointment with Dr. Jenny Reichmann on Wednesday May 21, 2021-- your next scheduled appointment with him is October 06, 2021 at 3:00 pm If you would like to talk to Dr. Jenny Reichmann about your high blood sugar readings, please make another appointment with him before October 06, 2021    Plan: Telephone follow up appointment with care management team member scheduled for:  Thursday, June 26, 2021 at 11:30 am The patient has been provided with contact information for the care management team and has been advised to call with any health related questions or concerns  [name], RN, BSN, [place] [phone]: direct office

## 2021-05-22 NOTE — Patient Instructions (Addendum)
Visit Information ? ?Allen Hunter, thank you for taking time to talk with me today. Please don't hesitate to contact me if I can be of assistance to you before our next scheduled telephone appointment ? ?It appears you did not attend your scheduled appointment with Dr. Jenny Reichmann on Wednesday May 21, 2021-- your next scheduled appointment with him is October 06, 2021 at 3:00 pm ?If you would like to talk to Dr. Jenny Reichmann about your high blood sugar readings, please make another appointment with him before October 06, 2021 ?A list of your current appointments is enclosed: please put these appointments on your calendar and make every effort to attend these appointments ? ?Below are the goals we discussed today:  ?Patient Self-Care Activities: ?Patient Allen Hunter will: ?Take medications as prescribed ?Attend all scheduled provider appointments ?Call pharmacy for medication refills ?Call provider office for new concerns or questions ?Continue to check fasting (first thing in the morning, before eating) blood sugars at home-- please write these down on paper so we can review them next time we talk on the phone ?Continue to check your weights at home every day: please write these down on paper so we can review them during our future phone calls ?Call your doctor is you notice overnight weight gain greater than 3-5 lbs: this is often the earliest sign of fluid retention, and could cause your breathing to be difficult if you do not take action ?Continue efforts to follow heart healthy, low salt, low cholesterol, carbohydrate-modified, low sugar diet ?Keep up the great work preventing falls  ?Review the educational material about Diabetes and Heart Failure that I mailed to you on May 13, 2021 ?It appears you did not attend your scheduled appointment with Dr. Jenny Reichmann on Wednesday May 21, 2021-- your next scheduled appointment with him is October 06, 2021 at 3:00 pm ?If you would like to talk to Dr. Jenny Reichmann about your high blood sugar readings, please make  another appointment with him before October 06, 2021 ? ? ?Our next scheduled telephone follow up visit/ appointment is scheduled on:   Thursday, June 26, 2021 at 11:30 am- This is a PHONE CALL appointment ? ?If you need to cancel or re-schedule our visit, please call [phone] and our care guide team will be happy to assist you. ?  ?I look forward to hearing about your progress. ?  ?[name], RN, BSN, CCRN Alumnus ?[place] ?([phone]: direct office ? ?If you are experiencing a Mental Health or [place] or need someone to talk to, please  ?call the Suicide and Crisis Lifeline: 988 ?call the [place] National Suicide Prevention Lifeline: (336) 810-5983 or TTY: (843)030-0571 TTY 709-396-1534) to talk to a trained counselor ?call 1-800-273-TALK (toll free, 24 hour hotline) ?go to [facility] [address], [place] [phone]) ?call 911  ? ?The patient verbalized understanding of instructions, educational materials, and care plan provided today and agreed to receive a mailed copy of patient instructions, educational materials, and care plan ? ? ? ? ?

## 2021-05-26 ENCOUNTER — Inpatient Hospital Stay
Admission: EM | Admit: 2021-05-26 | Discharge: 2021-05-28 | DRG: 638 | Disposition: A | Discharge status: Home Health | Payer: Medicare HMO | Attending: Internal Medicine | Admitting: Internal Medicine

## 2021-05-26 ENCOUNTER — Other Ambulatory Visit: Payer: Self-pay | Admitting: Internal Medicine

## 2021-05-26 ENCOUNTER — Emergency Department: Payer: Medicare HMO

## 2021-05-26 ENCOUNTER — Other Ambulatory Visit: Payer: Self-pay

## 2021-05-26 ENCOUNTER — Encounter: Payer: Self-pay | Admitting: Emergency Medicine

## 2021-05-26 DIAGNOSIS — N179 Acute kidney failure, unspecified: Secondary | ICD-10-CM | POA: Diagnosis present

## 2021-05-26 DIAGNOSIS — Z8719 Personal history of other diseases of the digestive system: Secondary | ICD-10-CM

## 2021-05-26 DIAGNOSIS — R6510 Systemic inflammatory response syndrome (SIRS) of non-infectious origin without acute organ dysfunction: Secondary | ICD-10-CM | POA: Diagnosis not present

## 2021-05-26 DIAGNOSIS — I110 Hypertensive heart disease with heart failure: Secondary | ICD-10-CM | POA: Diagnosis not present

## 2021-05-26 DIAGNOSIS — E861 Hypovolemia: Secondary | ICD-10-CM | POA: Diagnosis present

## 2021-05-26 DIAGNOSIS — Z602 Problems related to living alone: Secondary | ICD-10-CM | POA: Diagnosis present

## 2021-05-26 DIAGNOSIS — E785 Hyperlipidemia, unspecified: Secondary | ICD-10-CM | POA: Diagnosis not present

## 2021-05-26 DIAGNOSIS — Z79899 Other long term (current) drug therapy: Secondary | ICD-10-CM | POA: Diagnosis not present

## 2021-05-26 DIAGNOSIS — E1110 Type 2 diabetes mellitus with ketoacidosis without coma: Secondary | ICD-10-CM | POA: Diagnosis present

## 2021-05-26 DIAGNOSIS — I959 Hypotension, unspecified: Secondary | ICD-10-CM | POA: Diagnosis not present

## 2021-05-26 DIAGNOSIS — R6520 Severe sepsis without septic shock: Secondary | ICD-10-CM | POA: Diagnosis present

## 2021-05-26 DIAGNOSIS — I10 Essential (primary) hypertension: Secondary | ICD-10-CM | POA: Diagnosis present

## 2021-05-26 DIAGNOSIS — E1165 Type 2 diabetes mellitus with hyperglycemia: Principal | ICD-10-CM | POA: Diagnosis present

## 2021-05-26 DIAGNOSIS — Z20822 Contact with and (suspected) exposure to covid-19: Secondary | ICD-10-CM | POA: Diagnosis not present

## 2021-05-26 DIAGNOSIS — I5032 Chronic diastolic (congestive) heart failure: Secondary | ICD-10-CM | POA: Diagnosis present

## 2021-05-26 DIAGNOSIS — G4733 Obstructive sleep apnea (adult) (pediatric): Secondary | ICD-10-CM | POA: Diagnosis present

## 2021-05-26 DIAGNOSIS — Z8546 Personal history of malignant neoplasm of prostate: Secondary | ICD-10-CM

## 2021-05-26 DIAGNOSIS — R739 Hyperglycemia, unspecified: Secondary | ICD-10-CM | POA: Diagnosis present

## 2021-05-26 DIAGNOSIS — Z7982 Long term (current) use of aspirin: Secondary | ICD-10-CM | POA: Diagnosis not present

## 2021-05-26 DIAGNOSIS — E860 Dehydration: Secondary | ICD-10-CM | POA: Diagnosis not present

## 2021-05-26 DIAGNOSIS — T383X6A Underdosing of insulin and oral hypoglycemic [antidiabetic] drugs, initial encounter: Secondary | ICD-10-CM | POA: Diagnosis present

## 2021-05-26 DIAGNOSIS — Z8249 Family history of ischemic heart disease and other diseases of the circulatory system: Secondary | ICD-10-CM

## 2021-05-26 DIAGNOSIS — N400 Enlarged prostate without lower urinary tract symptoms: Secondary | ICD-10-CM | POA: Diagnosis present

## 2021-05-26 DIAGNOSIS — J449 Chronic obstructive pulmonary disease, unspecified: Secondary | ICD-10-CM | POA: Diagnosis not present

## 2021-05-26 DIAGNOSIS — Z7984 Long term (current) use of oral hypoglycemic drugs: Secondary | ICD-10-CM

## 2021-05-26 DIAGNOSIS — E872 Acidosis, unspecified: Secondary | ICD-10-CM

## 2021-05-26 DIAGNOSIS — E119 Type 2 diabetes mellitus without complications: Secondary | ICD-10-CM

## 2021-05-26 DIAGNOSIS — Z87891 Personal history of nicotine dependence: Secondary | ICD-10-CM

## 2021-05-26 DIAGNOSIS — Z794 Long term (current) use of insulin: Secondary | ICD-10-CM | POA: Diagnosis not present

## 2021-05-26 DIAGNOSIS — A419 Sepsis, unspecified organism: Secondary | ICD-10-CM | POA: Diagnosis present

## 2021-05-26 DIAGNOSIS — E162 Hypoglycemia, unspecified: Secondary | ICD-10-CM | POA: Diagnosis not present

## 2021-05-26 DIAGNOSIS — R9431 Abnormal electrocardiogram [ECG] [EKG]: Secondary | ICD-10-CM | POA: Diagnosis not present

## 2021-05-26 LAB — URINALYSIS, COMPLETE (UACMP) WITH MICROSCOPIC
Bilirubin Urine: NEGATIVE
Glucose, UA: >=500 mg/dL — AB
Hgb urine dipstick: NEGATIVE
Ketones, ur: 20 mg/dL — AB
Leukocytes,Ua: NEGATIVE
Nitrite: NEGATIVE
Protein, ur: NEGATIVE mg/dL
Specific Gravity, Urine: 1.028 (ref 1.005–1.030)
Squamous Epithelial / HPF: NONE SEEN (ref 0–5)
pH: 5 (ref 5.0–8.0)

## 2021-05-26 LAB — MAGNESIUM: Magnesium: 1.7 mg/dL (ref 1.7–2.4)

## 2021-05-26 LAB — CBG MONITORING, ED
Glucose-Capillary: 511 mg/dL (ref 70–99)
Glucose-Capillary: 470 mg/dL — ABNORMAL HIGH (ref 70–99)
Glucose-Capillary: 590 mg/dL (ref 70–99)

## 2021-05-26 LAB — BASIC METABOLIC PANEL
Anion gap: 11 (ref 5–15)
Anion gap: 5 (ref 5–15)
BUN: 21 mg/dL (ref 8–23)
BUN: 22 mg/dL (ref 8–23)
CO2: 25 mmol/L (ref 22–32)
CO2: 25 mmol/L (ref 22–32)
Calcium: 9.5 mg/dL (ref 8.9–10.3)
Calcium: 8.6 mg/dL — ABNORMAL LOW (ref 8.9–10.3)
Chloride: 99 mmol/L (ref 98–111)
Chloride: 107 mmol/L (ref 98–111)
Creatinine, Ser: 1.35 mg/dL — ABNORMAL HIGH (ref 0.61–1.24)
Creatinine, Ser: 1.39 mg/dL — ABNORMAL HIGH (ref 0.61–1.24)
GFR, Estimated: 52 mL/min — ABNORMAL LOW (ref >60)
GFR, Estimated: 51 mL/min — ABNORMAL LOW (ref >60)
Glucose, Bld: 560 mg/dL (ref 70–99)
Glucose, Bld: 336 mg/dL — ABNORMAL HIGH (ref 70–99)
Potassium: 5.2 mmol/L — ABNORMAL HIGH (ref 3.5–5.1)
Potassium: 3.8 mmol/L (ref 3.5–5.1)
Sodium: 135 mmol/L (ref 135–145)
Sodium: 137 mmol/L (ref 135–145)

## 2021-05-26 LAB — CBC
HCT: 44.6 % (ref 39.0–52.0)
HCT: 41.3 % (ref 39.0–52.0)
Hemoglobin: 14.1 g/dL (ref 13.0–17.0)
Hemoglobin: 13 g/dL (ref 13.0–17.0)
MCH: 30.2 pg (ref 26.0–34.0)
MCH: 29.9 pg (ref 26.0–34.0)
MCHC: 31.6 g/dL (ref 30.0–36.0)
MCHC: 31.5 g/dL (ref 30.0–36.0)
MCV: 95.5 fL (ref 80.0–100.0)
MCV: 94.9 fL (ref 80.0–100.0)
Platelets: 175 10*3/uL (ref 150–400)
Platelets: 167 10*3/uL (ref 150–400)
RBC: 4.67 MIL/uL (ref 4.22–5.81)
RBC: 4.35 MIL/uL (ref 4.22–5.81)
RDW: 17.2 % — ABNORMAL HIGH (ref 11.5–15.5)
RDW: 17.1 % — ABNORMAL HIGH (ref 11.5–15.5)
WBC: 3.7 10*3/uL — ABNORMAL LOW (ref 4.0–10.5)
WBC: 4.9 10*3/uL (ref 4.0–10.5)
nRBC: 0 % (ref 0.0–0.2)
nRBC: 0 % (ref 0.0–0.2)

## 2021-05-26 LAB — LACTIC ACID, PLASMA
Lactic Acid, Venous: 3 mmol/L (ref 0.5–1.9)
Lactic Acid, Venous: 2.2 mmol/L (ref 0.5–1.9)

## 2021-05-26 LAB — RESP PANEL BY RT-PCR (FLU A&B, COVID) ARPGX2
Influenza A by PCR: NEGATIVE
Influenza B by PCR: NEGATIVE
SARS Coronavirus 2 by RT PCR: NEGATIVE

## 2021-05-26 LAB — BLOOD GAS, VENOUS: Patient temperature: 37

## 2021-05-26 LAB — TROPONIN I (HIGH SENSITIVITY): Troponin I (High Sensitivity): 6 ng/L (ref <18)

## 2021-05-26 LAB — BETA-HYDROXYBUTYRIC ACID: Beta-Hydroxybutyric Acid: 1.9 mmol/L — ABNORMAL HIGH (ref 0.05–0.27)

## 2021-05-26 LAB — APTT: aPTT: 28 seconds (ref 24–36)

## 2021-05-26 LAB — GLUCOSE, CAPILLARY: Glucose-Capillary: 349 mg/dL — ABNORMAL HIGH (ref 70–99)

## 2021-05-26 LAB — PROTIME-INR
INR: 1 (ref 0.8–1.2)
Prothrombin Time: 13.3 seconds (ref 11.4–15.2)

## 2021-05-26 MED ORDER — INSULIN ASPART 100 UNIT/ML IJ SOLN
0.0000 [IU] | Freq: Three times a day (TID) | INTRAMUSCULAR | Status: DC
  Administered 2021-05-27: 7 [IU] via SUBCUTANEOUS
  Filled 2021-05-26: qty 1

## 2021-05-26 MED ORDER — HEPARIN SODIUM (PORCINE) 5000 UNIT/ML IJ SOLN: 5000.0000 [IU] | Freq: Three times a day (TID) | INTRAMUSCULAR | Status: DC

## 2021-05-26 MED ORDER — VANCOMYCIN HCL IN DEXTROSE 1-5 GM/200ML-% IV SOLN
1000.0000 mg | Freq: Once | INTRAVENOUS | Status: AC
  Administered 2021-05-26: 1000 mg via INTRAVENOUS
  Filled 2021-05-26: qty 200

## 2021-05-26 MED ORDER — INSULIN ASPART 100 UNIT/ML IJ SOLN
0.0000 [IU] | Freq: Every day | INTRAMUSCULAR | Status: DC
  Administered 2021-05-26: 4 [IU] via SUBCUTANEOUS
  Filled 2021-05-26: qty 1

## 2021-05-26 MED ORDER — METRONIDAZOLE 500 MG/100ML IV SOLN
500.0000 mg | Freq: Once | INTRAVENOUS | Status: AC
  Administered 2021-05-26: 500 mg via INTRAVENOUS
  Filled 2021-05-26: qty 100

## 2021-05-26 MED ORDER — VANCOMYCIN HCL 750 MG/150ML IV SOLN
750.0000 mg | Freq: Once | INTRAVENOUS | Status: AC
  Administered 2021-05-26: 750 mg via INTRAVENOUS
  Filled 2021-05-26: qty 150

## 2021-05-26 MED ORDER — SODIUM CHLORIDE 0.9 % IV BOLUS
1000.0000 mL | Freq: Once | INTRAVENOUS | Status: AC
  Administered 2021-05-26: 1000 mL via INTRAVENOUS

## 2021-05-26 MED ORDER — ACETAMINOPHEN 650 MG RE SUPP: 650.0000 mg | Freq: Four times a day (QID) | RECTAL | Status: DC | PRN

## 2021-05-26 MED ORDER — POLYETHYLENE GLYCOL 3350 17 G PO PACK: 17.0000 g | PACK | Freq: Every day | ORAL | Status: DC | PRN

## 2021-05-26 MED ORDER — ACETAMINOPHEN 325 MG PO TABS: 650.0000 mg | ORAL_TABLET | Freq: Four times a day (QID) | ORAL | Status: DC | PRN

## 2021-05-26 MED ORDER — INSULIN ASPART 100 UNIT/ML IJ SOLN: 0.0000 [IU] | INTRAMUSCULAR | Status: DC

## 2021-05-26 MED ORDER — METRONIDAZOLE 500 MG/100ML IV SOLN
500.0000 mg | Freq: Two times a day (BID) | INTRAVENOUS | Status: DC
  Administered 2021-05-27: 500 mg via INTRAVENOUS
  Filled 2021-05-26: qty 100

## 2021-05-26 MED ORDER — ONDANSETRON HCL 4 MG/2ML IJ SOLN: 4.0000 mg | Freq: Four times a day (QID) | INTRAMUSCULAR | Status: DC | PRN

## 2021-05-26 MED ORDER — DOCUSATE SODIUM 100 MG PO CAPS
100.0000 mg | ORAL_CAPSULE | Freq: Two times a day (BID) | ORAL | Status: DC
  Administered 2021-05-27 – 2021-05-28 (×3): 100 mg via ORAL
  Filled 2021-05-26 (×3): qty 1

## 2021-05-26 MED ORDER — INSULIN ASPART 100 UNIT/ML IJ SOLN
12.0000 [IU] | Freq: Once | INTRAMUSCULAR | Status: AC
Start: 2021-05-26 — End: 2021-05-26
  Administered 2021-05-26: 12 [IU] via INTRAVENOUS
  Filled 2021-05-26: qty 1

## 2021-05-26 MED ORDER — LACTATED RINGERS IV SOLN: INTRAVENOUS | Status: AC

## 2021-05-26 MED ORDER — ONDANSETRON HCL 4 MG PO TABS
4.0000 mg | ORAL_TABLET | Freq: Four times a day (QID) | ORAL | Status: DC | PRN
Start: 2021-05-26 — End: 2021-05-28

## 2021-05-26 MED ORDER — DARIFENACIN HYDROBROMIDE ER 7.5 MG PO TB24
7.5000 mg | ORAL_TABLET | Freq: Every day | ORAL | Status: DC
Start: 2021-05-27 — End: 2021-05-28
  Administered 2021-05-27 – 2021-05-28 (×2): 7.5 mg via ORAL
  Filled 2021-05-26 (×2): qty 1

## 2021-05-26 MED ORDER — SODIUM CHLORIDE 0.9 % IV SOLN
2.0000 g | Freq: Once | INTRAVENOUS | Status: AC
  Administered 2021-05-26: 2 g via INTRAVENOUS
  Filled 2021-05-26: qty 2

## 2021-05-26 MED ORDER — BISACODYL 5 MG PO TBEC: 5.0000 mg | DELAYED_RELEASE_TABLET | Freq: Every day | ORAL | Status: DC | PRN

## 2021-05-26 MED ORDER — DONEPEZIL HCL 5 MG PO TABS
10.0000 mg | ORAL_TABLET | Freq: Every day | ORAL | Status: DC
  Administered 2021-05-27 – 2021-05-28 (×2): 10 mg via ORAL
  Filled 2021-05-26 (×2): qty 2

## 2021-05-26 MED ORDER — SODIUM CHLORIDE 0.9 % IV SOLN
2.0000 g | Freq: Two times a day (BID) | INTRAVENOUS | Status: DC
  Administered 2021-05-27: 2 g via INTRAVENOUS
  Filled 2021-05-26 (×2): qty 2

## 2021-05-26 MED ORDER — VANCOMYCIN HCL IN DEXTROSE 1-5 GM/200ML-% IV SOLN: 1000.0000 mg | INTRAVENOUS | Status: DC

## 2021-05-26 MED ORDER — NOREPINEPHRINE 4 MG/250ML-% IV SOLN: 0.0000 ug/min | INTRAVENOUS | Status: DC

## 2021-05-26 MED ORDER — TAMSULOSIN HCL 0.4 MG PO CAPS
0.4000 mg | ORAL_CAPSULE | Freq: Every day | ORAL | Status: DC
  Administered 2021-05-27 – 2021-05-28 (×2): 0.4 mg via ORAL
  Filled 2021-05-26 (×2): qty 1

## 2021-05-26 MED ORDER — INSULIN ASPART 100 UNIT/ML IJ SOLN
10.0000 [IU] | Freq: Once | INTRAMUSCULAR | Status: AC
  Administered 2021-05-26: 10 [IU] via INTRAVENOUS
  Filled 2021-05-26: qty 1

## 2021-05-26 MED ORDER — SODIUM CHLORIDE 0.9 % IV SOLN: 1000.0000 mL | Freq: Once | INTRAVENOUS | Status: DC

## 2021-05-26 NOTE — ED Provider Triage Note (Signed)
Emergency Medicine Provider Triage Evaluation Note ? ?Allen Hunter , a 81 y.o. male  was evaluated in triage.  Pt complains of high blood sugar.  On his monitor this morning it read "high."  He denies polyuria, polydipsia, blurred vision, nausea, vomiting.  He has taken his medications as prescribed and has not missed any doses.. ? ?Review of Systems  ?Positive: No symptoms of concern ?Negative: Polyuria, polydipsia, headache, blurry vision, nausea, vomiting ? ?Physical Exam  ?There were no vitals taken for this visit. ?Gen:   Awake, no distress   ?Resp:  Normal effort  ?MSK:   Moves extremities without difficulty  ?Other:   ? ?Medical Decision Making  ?Medically screening exam initiated at 12:31 PM.  Appropriate orders placed.  Allen Hunter. was informed that the remainder of the evaluation will be completed by another provider, this initial triage assessment does not replace that evaluation, and the importance of remaining in the ED until their evaluation is complete. ? ?  ?Victorino Dike, FNP ?05/26/21 1233 ? ?

## 2021-05-26 NOTE — Consult Note (Signed)
Pharmacy Antibiotic Note ? ?Allen Hunter. is a 81 y.o. male admitted on 05/26/2021 with sepsis.  Pharmacy has been consulted for Vancomycin and Cefepime dosing. ? ?Plan:  ?Patient received 1g Vancomycin in ED. Will order additional '750mg'$  to equal '1750mg'$  total loading dose. ?Maintenance: Vancomycin 1000 mg IV Q 24 hrs. Goal AUC 400-550. ?Expected AUC: 479.2 ?Expected Css: 13.2 ?SCr used: 1.39, Vd: 0.72 L/kg ? ?Cefepime 2g IV Q12 hours ? ? ?Height: '5\' 6"'$  (167.6 cm) ?Weight: 82.6 kg (182 lb) ?IBW/kg (Calculated) : 63.8 ? ?Temp (24hrs), Avg:97.9 ?F (36.6 ?C), Min:97.9 ?F (36.6 ?C), Max:97.9 ?F (36.6 ?C) ? ?Recent Labs  ?Lab 05/26/21 ?1237 05/26/21 ?1718  ?WBC 3.7*  --   ?CREATININE 1.35* 1.39*  ?LATICACIDVEN  --  3.0*  ?  ?Estimated Creatinine Clearance: 41.3 mL/min (A) (by C-G formula based on SCr of 1.39 mg/dL (H)).   ? ?No Known Allergies ? ?Antimicrobials this admission: ?Vancomycin 3/6 >>  ?Cefepime 3/6 >>  ? ?Microbiology results: ?3/6 BCx: pending ? ? ?Thank you for allowing pharmacy to be a part of this patient?s care. ? ?Pearla Dubonnet ?05/26/2021 7:40 PM ? ?

## 2021-05-26 NOTE — Plan of Care (Signed)
?  Problem: Education: ?Goal: Knowledge of General Education information will improve ?Description: Including pain rating scale, medication(s)/side effects and non-pharmacologic comfort measures ?Outcome: Progressing ?  ?Problem: Clinical Measurements: ?Goal: Ability to maintain clinical measurements within normal limits will improve ?Outcome: Progressing ?Goal: Will remain free from infection ?Outcome: Progressing ?Goal: Diagnostic test results will improve ?Outcome: Progressing ?Goal: Respiratory complications will improve ?Outcome: Progressing ?Goal: Cardiovascular complication will be avoided ?Outcome: Progressing ?  ?Problem: Nutrition: ?Goal: Adequate nutrition will be maintained ?Outcome: Progressing ?  ?Problem: Safety: ?Goal: Ability to remain free from injury will improve ?Outcome: Progressing ?  ?Problem: Skin Integrity: ?Goal: Risk for impaired skin integrity will decrease ?Outcome: Progressing ?  ?

## 2021-05-26 NOTE — H&P (Signed)
History and Physical    Patient: Allen Hunter. WER:[id number] DOB: 01/16/1941 DOA: 05/26/2021 DOS: the patient was seen and examined on 05/26/2021 PCP: [name], MD  Patient coming from: Home  Chief Complaint:  Chief Complaint  Patient presents with   Hyperglycemia   HPI: Allen Hunter. is a 82 y.o. male with medical history significant of DM II, HTN, Asthma brought to ed via for hyperglycemia at home. Pt is ambulatory at baseline. Pt states he does not remember any of his symptoms.He denies any dizziness or chest pain.   Review of Systems:  Review of Systems  Constitutional: Negative.   HENT: Negative.    Eyes: Negative.   Respiratory:  Positive for cough.   Cardiovascular: Negative.   Gastrointestinal: Negative.   Genitourinary: Negative.   Musculoskeletal: Negative.   Skin: Negative.   Neurological: Negative.   Endo/Heme/Allergies: Negative.        Feeling cold since last night.    Psychiatric/Behavioral: Negative.     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 His smoking use included cigarettes. He has a 48.00 pack-year smoking history. He has never used smokeless tobacco. He reports that he does not drink alcohol and does not use drugs.  No Known Allergies  Family History   Problem Relation Age of Onset   Hypertension Mother    Cancer Other     Prior to Admission medications   Medication Sig Start Date End Date Taking? Authorizing Provider  allopurinol (ZYLOPRIM) 300 MG tablet Take 1 tablet (300 mg total) by mouth daily. 11/14/20   [name], MD  aspirin 81 MG tablet Take 81 mg by mouth daily.    [provider]  atorvastatin (LIPITOR) 80 MG tablet Take 1 tablet (80 mg total) by mouth daily. 11/14/20   [name], MD  bisoprolol (ZEBETA) 5 MG tablet Take 1 tablet (5 mg total) by mouth daily. 11/14/20   [name], MD  blood glucose meter kit and supplies Dispense based on patient and insurance preference. Use up to four times daily as directed. (FOR ICD-10 E10.9, E11.9). 07/23/20   [name], MD  donepezil (ARICEPT) 10 MG tablet Take 1 tablet(43m) by mouth daily. 05/09/21   SMax Sane MD  esomeprazole (NEXIUM) 40 MG capsule Take 1 capsule (40 mg total) by mouth daily. 11/14/20   JBiagio Borg MD  glipiZIDE (GLUCOTROL) 5 MG tablet TAKE ONE-HALF TABLET BY MOUTH TWO TIMES A DAY FOR DIABETES. TAKE 30 MINUTES BEFORE MEAL(S) 03/18/21   [provider]  glucose blood (FREESTYLE LITE) test strip USE 1 STRIP TWICE A DAY AS NEEDED TO CHECK SUGAR 10/01/20   JBiagio Borg MD  insulin glargine (LANTUS) 100 UNIT/ML Solostar Pen Inject 30 Units  into the skin daily. Patient taking differently: Inject 34 Units into the skin at bedtime. 11/14/20   [name], MD  JARDIANCE 25 MG TABS tablet Take 25 mg by mouth daily. 05/13/21   [provider]  Lancets MISC Use asd 1 per day 250.02 10/26/13   [name], MD  losartan (COZAAR) 25 MG tablet Take 1 tablet (25 mg total) by mouth daily. 04/23/21   [name], MD  metFORMIN (GLUCOPHAGE) 1000 MG tablet Take 1 tablet (1,000 mg total) by mouth 2 (two) times daily with a meal. 11/14/20 05/07/21  [name], MD  montelukast (SINGULAIR) 10 MG tablet Take 1 tablet (10 mg total) by mouth at bedtime.  11/14/20   [name], MD  Omega-3 Fatty Acids (FISH OIL) 500 MG CAPS Take 1 capsule by mouth daily.    [provider]  solifenacin (VESICARE) 5 MG tablet Take 1 tablet (5 mg total) by mouth daily. 11/14/20   [name], MD  tamsulosin (FLOMAX) 0.4 MG CAPS capsule Take 1 capsule (0.4 mg total) by mouth daily. 11/14/20   [name], MD    Physical Exam: Vitals:   05/26/21 1231 05/26/21 1232 05/26/21 1555 05/26/21 1900  BP: (!) 90/54  (!) 89/59 (!) 98/57  Pulse: 78  82 80  Resp: '20  16 16  ' Temp: 97.9 F (36.6 C)     TempSrc: Oral     SpO2: 98%  96% 97%  Weight:  82.6 kg    Height:  '5\' 6"'  (1.676 m)    Physical Exam Vitals and nursing note reviewed.  Constitutional:      General: He is not in acute distress.    Appearance: Normal appearance. He is not ill-appearing, toxic-appearing or diaphoretic.  HENT:     Head: Normocephalic and atraumatic.     Right Ear: Hearing and external ear normal.     Left Ear: Hearing and external ear normal.     Nose: Nose normal. No nasal deformity.     Mouth/Throat:     Lips: Pink.     Mouth: Mucous membranes are moist.     Tongue: No lesions.     Pharynx: Oropharynx is clear.  Eyes:     Extraocular Movements: Extraocular movements intact.     Pupils: Pupils are equal, round, and reactive to light.  Neck:     Vascular: No carotid bruit.  Cardiovascular:     Rate and Rhythm: Normal rate and regular rhythm.     Pulses: Normal pulses.     Heart sounds: Normal heart sounds.  Pulmonary:     Effort: Pulmonary effort is normal.     Breath sounds: Normal breath sounds.  Abdominal:     General: Bowel sounds are normal. There is no distension.     Palpations: Abdomen is soft. There is no mass.     Tenderness: There is no abdominal tenderness. There is no guarding.     Hernia: No hernia is present.  Musculoskeletal:     Right lower leg: No edema.     Left lower leg: No edema.  Skin:    General: Skin is warm.  Neurological:      General: No focal deficit present.     Mental Status: He is alert and oriented to person, place, and time.     Cranial Nerves: Cranial nerves 2-12 are intact.     Motor: Motor function is intact.  Psychiatric:  Attention and Perception: Attention normal.        Mood and Affect: Mood normal.        Speech: Speech normal.        Behavior: Behavior normal. Behavior is cooperative.        Cognition and Memory: Cognition normal.    Data Reviewed: Results for orders placed or performed during the hospital encounter of 05/26/21 (from the past 24 hour(s))  CBG monitoring, ED     Status: Abnormal   Collection Time: 05/26/21 12:28 PM  Result Value Ref Range   Glucose-Capillary 511 (HH) 70 - 99 mg/dL   Comment 1 Notify RN   Basic metabolic panel     Status: Abnormal   Collection Time: 05/26/21 12:37 PM  Result Value Ref Range   Sodium 135 135 - 145 mmol/L   Potassium 5.2 (H) 3.5 - 5.1 mmol/L   Chloride 99 98 - 111 mmol/L   CO2 25 22 - 32 mmol/L   Glucose, Bld 560 (HH) 70 - 99 mg/dL   BUN 21 8 - 23 mg/dL   Creatinine, Ser 1.35 (H) 0.61 - 1.24 mg/dL   Calcium 9.5 8.9 - 10.3 mg/dL   GFR, Estimated 52 (L) >60 mL/min   Anion gap 11 5 - 15  CBC     Status: Abnormal   Collection Time: 05/26/21 12:37 PM  Result Value Ref Range   WBC 3.7 (L) 4.0 - 10.5 K/uL   RBC 4.67 4.22 - 5.81 MIL/uL   Hemoglobin 14.1 13.0 - 17.0 g/dL   HCT 44.6 39.0 - 52.0 %   MCV 95.5 80.0 - 100.0 fL   MCH 30.2 26.0 - 34.0 pg   MCHC 31.6 30.0 - 36.0 g/dL   RDW 17.2 (H) 11.5 - 15.5 %   Platelets 175 150 - 400 K/uL   nRBC 0.0 0.0 - 0.2 %  CBG monitoring, ED     Status: Abnormal   Collection Time: 05/26/21  2:10 PM  Result Value Ref Range   Glucose-Capillary 470 (H) 70 - 99 mg/dL   Comment 1 Notify RN    Comment 2 Document in Chart   CBG monitoring, ED     Status: Abnormal   Collection Time: 05/26/21  2:39 PM  Result Value Ref Range   Glucose-Capillary 590 (HH) 70 - 99 mg/dL   Comment 1 Notify RN     Comment 2 Document in Chart   Resp Panel by RT-PCR (Flu A&B, Covid) Nasopharyngeal Swab     Status: None   Collection Time: 05/26/21  5:18 PM   Specimen: Nasopharyngeal Swab; Nasopharyngeal(NP) swabs in vial transport medium  Result Value Ref Range   SARS Coronavirus 2 by RT PCR NEGATIVE NEGATIVE   Influenza A by PCR NEGATIVE NEGATIVE   Influenza B by PCR NEGATIVE NEGATIVE  Basic metabolic panel     Status: Abnormal   Collection Time: 05/26/21  5:18 PM  Result Value Ref Range   Sodium 137 135 - 145 mmol/L   Potassium 3.8 3.5 - 5.1 mmol/L   Chloride 107 98 - 111 mmol/L   CO2 25 22 - 32 mmol/L   Glucose, Bld 336 (H) 70 - 99 mg/dL   BUN 22 8 - 23 mg/dL   Creatinine, Ser 1.39 (H) 0.61 - 1.24 mg/dL   Calcium 8.6 (L) 8.9 - 10.3 mg/dL   GFR, Estimated 51 (L) >60 mL/min   Anion gap 5 5 - 15  Lactic acid, plasma     Status: Abnormal  Collection Time: 05/26/21  5:18 PM  Result Value Ref Range   Lactic Acid, Venous 3.0 (HH) 0.5 - 1.9 mmol/L  Beta-hydroxybutyric acid     Status: Abnormal   Collection Time: 05/26/21  5:18 PM  Result Value Ref Range   Beta-Hydroxybutyric Acid 1.90 (H) 0.05 - 0.27 mmol/L  Blood gas, venous     Status: Abnormal (Preliminary result)   Collection Time: 05/26/21  5:18 PM  Result Value Ref Range   pH, Ven 7.26 7.25 - 7.43   pCO2, Ven 58 44 - 60 mmHg   pO2, Ven PENDING 32 - 45 mmHg   Bicarbonate 26.0 20.0 - 28.0 mmol/L   Acid-base deficit 2.1 (H) 0.0 - 2.0 mmol/L   O2 Saturation PENDING %   Patient temperature 37.0    Collection site VENOUS   Troponin I (High Sensitivity)     Status: None   Collection Time: 05/26/21  5:18 PM  Result Value Ref Range   Troponin I (High Sensitivity) 6 <18 ng/L  Magnesium     Status: None   Collection Time: 05/26/21  5:18 PM  Result Value Ref Range   Magnesium 1.7 1.7 - 2.4 mg/dL  Protime-INR     Status: None   Collection Time: 05/26/21  6:26 PM  Result Value Ref Range   Prothrombin Time 13.3 11.4 - 15.2 seconds   INR 1.0  0.8 - 1.2  APTT     Status: None   Collection Time: 05/26/21  6:26 PM  Result Value Ref Range   aPTT 28 24 - 36 seconds  Urinalysis, Complete w Microscopic Nasopharyngeal Swab     Status: Abnormal   Collection Time: 05/26/21  6:48 PM  Result Value Ref Range   Color, Urine YELLOW (A) YELLOW   APPearance HAZY (A) CLEAR   Specific Gravity, Urine 1.028 1.005 - 1.030   pH 5.0 5.0 - 8.0   Glucose, UA >=500 (A) NEGATIVE mg/dL   Hgb urine dipstick NEGATIVE NEGATIVE   Bilirubin Urine NEGATIVE NEGATIVE   Ketones, ur 20 (A) NEGATIVE mg/dL   Protein, ur NEGATIVE NEGATIVE mg/dL   Nitrite NEGATIVE NEGATIVE   Leukocytes,Ua NEGATIVE NEGATIVE   RBC / HPF 0-5 0 - 5 RBC/hpf   WBC, UA 0-5 0 - 5 WBC/hpf   Bacteria, UA RARE (A) NONE SEEN   Squamous Epithelial / LPF NONE SEEN 0 - 5   Mucus PRESENT    Hyaline Casts, UA PRESENT   Lactic acid, plasma     Status: Abnormal   Collection Time: 05/26/21  7:10 PM  Result Value Ref Range   Lactic Acid, Venous 2.2 (HH) 0.5 - 1.9 mmol/L    Assessment and Plan: >DM II/ Hyperglycemia/ DKA: -last a1c of 7.2 -presenting with glucose of over 500 elevated BHB no acidosis. -we will cont with IVF hydration and SSI coverage.  -hold glucotrol and jardiance and cont with lantus and SSI.  >Hypotension/ suspected severe sepsis: Pt was hypotensive initially and dehydrated which is most likely from combination of jardiance and BP meds making pt hypovolemic and hypotensive causing low bp and AKI. Pt remained hypotensive even with 2 L IVFB in ed with MAP of 67. Pt Denies any dizziness. PT has no wbc count or source of infection and I have continued IV abx but would d/c once procalcitonin negative and blood cultures are negative. We will cont with MIVF.strict I/O.  >AKI: Lab Results  Component Value Date   CREATININE 1.39 (H) 05/26/2021   CREATININE 1.35 (H)  05/26/2021   CREATININE 1.09 05/09/2021  Attribute to combination of metformin and dehydration.  We will contin  with MIVF.  >Memory issue: Pt lives alone but has difficulty  with his memory and may need placement .      Advance Care Planning:    Code Status: Full Code    Consults:  None    Family Communication:  Blunt,Brenda (Sister)  [phone] (Home Phone)   Severity of Illness: The appropriate patient status for this patient is OBSERVATION. Observation status is judged to be reasonable and necessary in order to provide the required intensity of service to ensure the patient's safety. The patient's presenting symptoms, physical exam findings, and initial radiographic and laboratory data in the context of their medical condition is felt to place them at decreased risk for further clinical deterioration. Furthermore, it is anticipated that the patient will be medically stable for discharge from the hospital within 2 midnights of admission.   Author: Para Skeans, MD 05/26/2021 7:57 PM  For on call review [email or web address].

## 2021-05-26 NOTE — ED Triage Notes (Signed)
Pt reports that he checked his blood sugar this am and it told him it was "high" he went to [facility] and they reported his CBG was 425. It is 511 here. Denies any symptoms. He takes a pills and shots to keep his CBG under control and is taking all his medication. ?

## 2021-05-26 NOTE — ED Provider Notes (Signed)
? ?[facility] ?Provider Note ? ? ? Event Date/Time  ? First MD Initiated Contact with Patient 05/26/21 1536   ?  (approximate) ? ? ?History  ? ?Hyperglycemia ? ? ?HPI ? ?Allen Hunter. is a 81 y.o. male with a history of diabetes, COPD, hypertension who presents with complaints of hyperglycemia.  Patient reports his glucose meter registered high today.  He admits that he has been drinking Pepsi's recently.  Otherwise he feels well.  Denies abdominal pain, no nausea or vomiting.  No fevers or chills ?  ? ? ?Physical Exam  ? ?Triage Vital Signs: ?ED Triage Vitals  ?Enc Vitals Group  ?   BP 05/26/21 1231 (!) 90/54  ?   Pulse Rate 05/26/21 1231 78  ?   Resp 05/26/21 1231 20  ?   Temp 05/26/21 1231 97.9 ?F (36.6 ?C)  ?   Temp Source 05/26/21 1231 Oral  ?   SpO2 05/26/21 1231 98 %  ?   Weight 05/26/21 1232 82.6 kg (182 lb)  ?   Height 05/26/21 1232 1.676 m ('5\' 6"'$ )  ?   Head Circumference --   ?   Peak Flow --   ?   Pain Score 05/26/21 1232 0  ?   Pain Loc --   ?   Pain Edu? --   ?   Excl. in [place]? --   ? ? ?Most recent vital signs: ?Vitals:  ? 05/26/21 1231 05/26/21 1555  ?BP: (!) 90/54 (!) 89/59  ?Pulse: 78 82  ?Resp: 20 16  ?Temp: 97.9 ?F (36.6 ?C)   ?SpO2: 98% 96%  ? ? ? ?General: Awake, no distress.  ?CV:  Good peripheral perfusion.  ?Resp:  Normal effort.  ?Abd:  No distention.  ?Other:   ? ? ?ED Results / Procedures / Treatments  ? ?Labs ?(all labs ordered are listed, but only abnormal results are displayed) ?Labs Reviewed  ?BASIC METABOLIC PANEL - Abnormal; Notable for the following components:  ?    Result Value  ? Potassium 5.2 (*)   ? Glucose, Bld 560 (*)   ? Creatinine, Ser 1.35 (*)   ? GFR, Estimated 52 (*)   ? All other components within normal limits  ?CBC - Abnormal; Notable for the following components:  ? WBC 3.7 (*)   ? RDW 17.2 (*)   ? All other components within normal limits  ?CBG MONITORING, ED - Abnormal; Notable for the following components:  ? Glucose-Capillary 511 (*)   ?  All other components within normal limits  ?CBG MONITORING, ED - Abnormal; Notable for the following components:  ? Glucose-Capillary 470 (*)   ? All other components within normal limits  ?CBG MONITORING, ED - Abnormal; Notable for the following components:  ? Glucose-Capillary 590 (*)   ? All other components within normal limits  ?URINALYSIS, ROUTINE W REFLEX MICROSCOPIC  ? ? ? ?EKG ? ? ? ? ?RADIOLOGY ? ? ? ? ?PROCEDURES: ? ?Critical Care performed:  ? ?Procedures ? ? ?MEDICATIONS ORDERED IN ED: ?Medications  ?0.9 %  sodium chloride infusion (1,000 mLs Intravenous Not Given 05/26/21 1532)  ?sodium chloride 0.9 % bolus 1,000 mL (0 mLs Intravenous Stopped 05/26/21 1422)  ?insulin aspart (novoLOG) injection 12 Units (12 Units Intravenous Given 05/26/21 1419)  ?insulin aspart (novoLOG) injection 10 Units (10 Units Intravenous Given 05/26/21 1552)  ? ? ? ?IMPRESSION / MDM / ASSESSMENT AND PLAN / ED COURSE  ?I reviewed the triage vital signs  and the nursing notes. ? ?Patient presents with hyperglycemia as noted above.  He is well-appearing and in no acute distress, initial blood pressure was borderline but improved with IV fluids ? ?Patient treated with IV fluids and IV insulin after noting that BMP demonstrated elevated glucose but normal anion gap ? ?CBC is unremarkable. ? ?Patient's glucose has not trended down yet, will give additional dose of insulin IV, additional fluids ? ?Have asked my colleague to follow-up, anticipate discharge after improvement in glucose ? ? ? ? ? ?  ? ? ?FINAL CLINICAL IMPRESSION(S) / ED DIAGNOSES  ? ?Final diagnoses:  ?Hyperglycemia  ? ? ? ?Rx / DC Orders  ? ?ED Discharge Orders   ? ? None  ? ?  ? ? ? ?Note:  This document was prepared using Dragon voice recognition software and may include unintentional dictation errors. ?  ?Lavonia Drafts, MD ?05/26/21 1558 ? ?

## 2021-05-26 NOTE — Sepsis Progress Note (Signed)
[facility] is following this sepsis ?

## 2021-05-26 NOTE — Consult Note (Signed)
CODE SEPSIS - PHARMACY COMMUNICATION ? ?**Broad Spectrum Antibiotics should be administered within 1 hour of Sepsis diagnosis** ? ?Time Code Sepsis Called/Page Received: 9574 ? ?Antibiotics Ordered: Vancomycin, Cefepime, Flagyl ? ?Time of 1st antibiotic administration: 1841 ? ?Additional action taken by pharmacy: none ? ?If necessary, Name of Provider/Nurse Contacted: n/a ? ? ? ?Pearla Dubonnet ,PharmD ?Clinical Pharmacist  ?05/26/2021  6:58 PM ? ?

## 2021-05-26 NOTE — Consult Note (Signed)
PHARMACY -  BRIEF ANTIBIOTIC NOTE  ? ?Pharmacy has received consult(s) for Vancomycin and Cefepime from an ED provider.  The patient's profile has been reviewed for ht/wt/allergies/indication/available labs.   ? ?One time order(s) placed for Vancomycin 1g IV and Cefepime 2g IV x 1 dose each. ? ?Further antibiotics/pharmacy consults should be ordered by admitting physician if indicated.       ?                ?Thank you, ?Collyn Selk A Raima Geathers ?05/26/2021  6:22 PM ? ?

## 2021-05-26 NOTE — ED Provider Notes (Signed)
I assumed care of the patient approximate 1500.  Please see outgoing writer's note for full details regarding patient's initial evaluation assessment.  In brief patient presents for evaluation of some hyperglycemia without any other associated complaints.  On arrival he is noted to be borderline hypotensive.  Plan is to give some IV fluids and insulin and recheck her sugar.  On recheck of sugar on repeat BMP it is improved to 336 from 560 but kidney function is worsening.  In addition he has been persistently low blood pressure now hypotensive after second liter of fluid with a BP of 89/59.  Concern for possible DKA versus early sepsis.  He is denying any other associated symptoms including chest pain, cough, shortness of breath, fever, nausea, vomiting, diarrhea or any extremity pain or headache.  His oropharynx is unremarkable.  His ears are unremarkable.  His lungs are clear bilaterally and his abdomen is soft.  Given concern for possible early sepsis full sepsis work-up initiated. ? ?Chest x-ray obtained without evidence of pneumonia.  Lactic acid 2:03 liters of fluid.  Beta hydroxybutyrate 1.9.  VBG with a pH of 7.26 PCO2 of 58 and bicarb of 26 consistent with a very mild hypercarbic acidosis.  Of note on the BMP patient's bicarb was 25 and his anion gap was 5 nonsuggestive of DKA.  I suspect some starvation ketosis.  UA has some ketones and glucose otherwise is not suggestive of any clinically significant cystitis.  INR and PTT are unremarkable.  Blood cultures obtained and broad-spectrum antibiotics given.  We will keep some LR infusion going at this time as patient seems relatively fluid responsive as his pressure has slowly increased.  We will admit to medicine service for further evaluation and management. ? ?.Critical Care ?Performed by: Lucrezia Starch, MD ?Authorized by: Lucrezia Starch, MD  ? ?Critical care provider statement:  ?  Critical care time (minutes):  30 ?  Critical care was necessary to  treat or prevent imminent or life-threatening deterioration of the following conditions:  Sepsis and dehydration ?  Critical care was time spent personally by me on the following activities:  Development of treatment plan with patient or surrogate, discussions with consultants, evaluation of patient's response to treatment, examination of patient, ordering and review of laboratory studies, ordering and review of radiographic studies, ordering and performing treatments and interventions, pulse oximetry, re-evaluation of patient's condition and review of old charts ? ? ? ?  ?Lucrezia Starch, MD ?05/26/21 1929 ? ?

## 2021-05-27 DIAGNOSIS — E860 Dehydration: Secondary | ICD-10-CM | POA: Diagnosis present

## 2021-05-27 DIAGNOSIS — E1165 Type 2 diabetes mellitus with hyperglycemia: Secondary | ICD-10-CM | POA: Diagnosis present

## 2021-05-27 DIAGNOSIS — Z20822 Contact with and (suspected) exposure to covid-19: Secondary | ICD-10-CM | POA: Diagnosis present

## 2021-05-27 DIAGNOSIS — N179 Acute kidney failure, unspecified: Secondary | ICD-10-CM | POA: Diagnosis present

## 2021-05-27 DIAGNOSIS — Z794 Long term (current) use of insulin: Secondary | ICD-10-CM | POA: Diagnosis not present

## 2021-05-27 DIAGNOSIS — G4733 Obstructive sleep apnea (adult) (pediatric): Secondary | ICD-10-CM | POA: Diagnosis present

## 2021-05-27 DIAGNOSIS — I110 Hypertensive heart disease with heart failure: Secondary | ICD-10-CM | POA: Diagnosis present

## 2021-05-27 DIAGNOSIS — Z7984 Long term (current) use of oral hypoglycemic drugs: Secondary | ICD-10-CM | POA: Diagnosis not present

## 2021-05-27 DIAGNOSIS — N400 Enlarged prostate without lower urinary tract symptoms: Secondary | ICD-10-CM | POA: Diagnosis present

## 2021-05-27 DIAGNOSIS — J449 Chronic obstructive pulmonary disease, unspecified: Secondary | ICD-10-CM | POA: Diagnosis present

## 2021-05-27 DIAGNOSIS — R739 Hyperglycemia, unspecified: Secondary | ICD-10-CM | POA: Diagnosis present

## 2021-05-27 DIAGNOSIS — Z8546 Personal history of malignant neoplasm of prostate: Secondary | ICD-10-CM | POA: Diagnosis not present

## 2021-05-27 DIAGNOSIS — Z8249 Family history of ischemic heart disease and other diseases of the circulatory system: Secondary | ICD-10-CM | POA: Diagnosis not present

## 2021-05-27 DIAGNOSIS — E861 Hypovolemia: Secondary | ICD-10-CM | POA: Diagnosis present

## 2021-05-27 DIAGNOSIS — T383X6A Underdosing of insulin and oral hypoglycemic [antidiabetic] drugs, initial encounter: Secondary | ICD-10-CM | POA: Diagnosis present

## 2021-05-27 DIAGNOSIS — E785 Hyperlipidemia, unspecified: Secondary | ICD-10-CM | POA: Diagnosis present

## 2021-05-27 DIAGNOSIS — Z87891 Personal history of nicotine dependence: Secondary | ICD-10-CM | POA: Diagnosis not present

## 2021-05-27 DIAGNOSIS — Z79899 Other long term (current) drug therapy: Secondary | ICD-10-CM | POA: Diagnosis not present

## 2021-05-27 DIAGNOSIS — Z8719 Personal history of other diseases of the digestive system: Secondary | ICD-10-CM | POA: Diagnosis not present

## 2021-05-27 DIAGNOSIS — I959 Hypotension, unspecified: Secondary | ICD-10-CM | POA: Diagnosis present

## 2021-05-27 DIAGNOSIS — I5032 Chronic diastolic (congestive) heart failure: Secondary | ICD-10-CM | POA: Diagnosis present

## 2021-05-27 DIAGNOSIS — Z7982 Long term (current) use of aspirin: Secondary | ICD-10-CM | POA: Diagnosis not present

## 2021-05-27 DIAGNOSIS — Z602 Problems related to living alone: Secondary | ICD-10-CM | POA: Diagnosis present

## 2021-05-27 LAB — BASIC METABOLIC PANEL
Anion gap: 17 — ABNORMAL HIGH (ref 5–15)
Anion gap: 8 (ref 5–15)
BUN: 21 mg/dL (ref 8–23)
BUN: 24 mg/dL — ABNORMAL HIGH (ref 8–23)
CO2: 19 mmol/L — ABNORMAL LOW (ref 22–32)
CO2: 23 mmol/L (ref 22–32)
Calcium: 9.1 mg/dL (ref 8.9–10.3)
Calcium: 8.6 mg/dL — ABNORMAL LOW (ref 8.9–10.3)
Chloride: 103 mmol/L (ref 98–111)
Chloride: 103 mmol/L (ref 98–111)
Creatinine, Ser: 1.2 mg/dL (ref 0.61–1.24)
Creatinine, Ser: 1.01 mg/dL (ref 0.61–1.24)
GFR, Estimated: >60 mL/min (ref >60)
GFR, Estimated: >60 mL/min (ref >60)
Glucose, Bld: 353 mg/dL — ABNORMAL HIGH (ref 70–99)
Glucose, Bld: 384 mg/dL — ABNORMAL HIGH (ref 70–99)
Potassium: 4.6 mmol/L (ref 3.5–5.1)
Potassium: 4.7 mmol/L (ref 3.5–5.1)
Sodium: 139 mmol/L (ref 135–145)
Sodium: 134 mmol/L — ABNORMAL LOW (ref 135–145)

## 2021-05-27 LAB — GLUCOSE, CAPILLARY
Glucose-Capillary: 319 mg/dL — ABNORMAL HIGH (ref 70–99)
Glucose-Capillary: 344 mg/dL — ABNORMAL HIGH (ref 70–99)
Glucose-Capillary: 382 mg/dL — ABNORMAL HIGH (ref 70–99)
Glucose-Capillary: 277 mg/dL — ABNORMAL HIGH (ref 70–99)

## 2021-05-27 LAB — CBC
HCT: 43.1 % (ref 39.0–52.0)
Hemoglobin: 13.6 g/dL (ref 13.0–17.0)
MCH: 30.3 pg (ref 26.0–34.0)
MCHC: 31.6 g/dL (ref 30.0–36.0)
MCV: 96 fL (ref 80.0–100.0)
Platelets: 160 10*3/uL (ref 150–400)
RBC: 4.49 MIL/uL (ref 4.22–5.81)
RDW: 17.2 % — ABNORMAL HIGH (ref 11.5–15.5)
WBC: 4.8 10*3/uL (ref 4.0–10.5)
nRBC: 0 % (ref 0.0–0.2)

## 2021-05-27 LAB — PROCALCITONIN: Procalcitonin: <0.1 ng/mL

## 2021-05-27 MED ORDER — ATORVASTATIN CALCIUM 20 MG PO TABS
80.0000 mg | ORAL_TABLET | Freq: Every day | ORAL | Status: DC
  Administered 2021-05-27 – 2021-05-28 (×2): 80 mg via ORAL
  Filled 2021-05-27 (×2): qty 4

## 2021-05-27 MED ORDER — INSULIN GLARGINE-YFGN 100 UNIT/ML ~~LOC~~ SOLN
34.0000 [IU] | Freq: Every day | SUBCUTANEOUS | Status: DC
  Administered 2021-05-27: 34 [IU] via SUBCUTANEOUS
  Filled 2021-05-27 (×2): qty 0.34

## 2021-05-27 MED ORDER — LACTATED RINGERS IV SOLN: INTRAVENOUS | Status: AC

## 2021-05-27 MED ORDER — MONTELUKAST SODIUM 10 MG PO TABS
10.0000 mg | ORAL_TABLET | Freq: Every day | ORAL | Status: DC
Start: 2021-05-27 — End: 2021-05-28
  Administered 2021-05-27: 10 mg via ORAL
  Filled 2021-05-27: qty 1

## 2021-05-27 MED ORDER — INSULIN ASPART 100 UNIT/ML IJ SOLN
0.0000 [IU] | Freq: Three times a day (TID) | INTRAMUSCULAR | Status: DC
  Administered 2021-05-27: 11 [IU] via SUBCUTANEOUS
  Administered 2021-05-27 – 2021-05-28 (×2): 15 [IU] via SUBCUTANEOUS
  Administered 2021-05-28: 5 [IU] via SUBCUTANEOUS
  Filled 2021-05-27 (×4): qty 1

## 2021-05-27 MED ORDER — ENOXAPARIN SODIUM 40 MG/0.4ML IJ SOSY
40.0000 mg | PREFILLED_SYRINGE | INTRAMUSCULAR | Status: DC
  Filled 2021-05-27: qty 0.4

## 2021-05-27 NOTE — Progress Notes (Signed)
PROGRESS NOTE    Allen Hunter.  FGH:[id number] DOB: 02-Aug-1940 DOA: 05/26/2021 PCP: [name], MD  [id number]/[id number]-AA   Assessment & Plan:   Principal Problem:   Hyperglycemia due to diabetes mellitus ([place]) Active Problems:   Diabetes ([place])   Chronic diastolic CHF (congestive heart failure) ([place])   AKI (acute kidney injury) ([place])   DKA (diabetic ketoacidosis) ([place])   Severe sepsis ([place])   Essential hypertension   Hyperglycemia   Allen Hunter. is a 81 y.o. male with medical history significant of DM II, HTN, Asthma brought to ed via for hyperglycemia at home.   # Hyperglycemia 2/2 # insulin non-compliance --Pt admitted to not taking his Lantus at home.   --not in DKA on presentation, however, BG 500's and beta-hydroxybutyric acid was elevated at 1.9 on presentation. --Pt was not ordered long-acting insulin on admission, and only ordered SSI at sensitive scale, so BG remained in 300's today and morning BMP showed gap opening up. Plan: --resume long-acting at home dose of 34u daily --change SSI to mod scale --LR'@100'$  for 10 hours --Ensure gap remained closed and bicarb normal prior to discharge  # DM2, poorly controlled --last A1c 8.7. --takes Glipizide 2.5 mg BID, Metformin 1000 mg BID, Lantus 34 units QHS at home.  Pt reported still taking Jardiance at home. Plan: --resume long-acting at home dose of 34u daily --change SSI to mod scale --hold home oral agents  # Hypotension  --likely 2/2 to volume depletion from osmotic diuresis.  BP improved with IVF given on presentation. Plan: --LR'@100'$  for 10 hours  # Sepsis, ruled out --did not meet any SIRS criteria on presentation, and no source of infection. --started on vanc/cefepime/flagyl on admission. Plan: --d/c all abx  # AKI, ruled out --Cr 1.35 on presentation, mildly elevated from baseline of around 1.1.  Did not meet criteria for AKI.    DVT prophylaxis: Lovenox SQ Code Status: Full code  Family  Communication:  Level of care: Med-Surg Dispo:   The patient is from: home Anticipated d/c is to: home Anticipated d/c date is: likely tomorrow Patient currently is not medically ready to d/c due to: hyperglycemia with gap opening up   Subjective and Interval History:  Pt said he felt well and was ready to go home.  Pt admitted to missing his insulin at home.  Pt was confused about how long he has been in the hospital, thought he had been here 4 days already.  Pt said he drove himself to the ED.  Pt was not ordered long-acting insulin on admission, and only ordered SSI at sensitive scale, so BG remained in 300's today and morning BMP showed gap opening up.  Pt was therefore kept inpatient.   Objective: Vitals:   05/27/21 0419 05/27/21 0727 05/27/21 1113 05/27/21 1538  BP: (!) 97/56 (!) 101/58 92/76 111/60  Pulse: 75 71 76 74  Resp: 16   17  Temp: 97.6 F (36.4 C) (!) 97.5 F (36.4 C) 97.6 F (36.4 C) 98.2 F (36.8 C)  TempSrc:  Oral Oral   SpO2: 98% 100% 97% 95%  Weight:      Height:        Intake/Output Summary (Last 24 hours) at 05/27/2021 1947 Last data filed at 05/27/2021 1828 Gross per 24 hour  Intake 992.44 ml  Output --  Net 992.44 ml   Filed Weights   05/26/21 1232 05/26/21 2133  Weight: 82.6 kg 74.4 kg    Examination:  Constitutional: NAD, alert, oriented to person and place HEENT: conjunctivae and lids normal, EOMI CV: No cyanosis.   RESP: normal respiratory effort, on RA SKIN: warm, dry Neuro: II - XII grossly intact.   Psych: Normal mood and affect.     Data Reviewed: I have personally reviewed following labs and imaging studies  CBC: Recent Labs  Lab 05/26/21 1237 05/26/21 2140 05/27/21 0616  WBC 3.7* 4.9 4.8  HGB 14.1 13.0 13.6  HCT 44.6 41.3 43.1  MCV 95.5 94.9 96.0  PLT 175 167 233   Basic Metabolic Panel: Recent Labs  Lab 05/26/21 1237 05/26/21 1718 05/26/21 2023 05/27/21 0616 05/27/21 1704  NA 135 137  --  139 134*  K 5.2*  3.8  --  4.6 4.7  CL 99 107  --  103 103  CO2 25 25  --  19* 23  GLUCOSE 560* 336*  --  353* 384*  BUN 21 22  --  21 24*  CREATININE 1.35* 1.39* 0.87 1.20 1.01  CALCIUM 9.5 8.6*  --  9.1 8.6*  MG  --  1.7  --   --   --    GFR: Estimated Creatinine Clearance: 50.9 mL/min (by C-G formula based on SCr of 1.01 mg/dL). Liver Function Tests: Recent Labs  Lab 05/26/21 2023  AST 13*  ALT 14  ALKPHOS 37*  BILITOT 0.8  PROT 3.3*  ALBUMIN 1.8*   Recent Labs  Lab 05/26/21 2023  LIPASE 30   No results for input(s): AMMONIA in the last 168 hours. Coagulation Profile: Recent Labs  Lab 05/26/21 1826  INR 1.0   Cardiac Enzymes: No results for input(s): CKTOTAL, CKMB, CKMBINDEX, TROPONINI in the last 168 hours. BNP (last 3 results) No results for input(s): PROBNP in the last 8760 hours. HbA1C: No results for input(s): HGBA1C in the last 72 hours. CBG: Recent Labs  Lab 05/26/21 1439 05/26/21 2132 05/27/21 0729 05/27/21 1217 05/27/21 1706  GLUCAP 590* 349* 319* 344* 382*   Lipid Profile: No results for input(s): CHOL, HDL, LDLCALC, TRIG, CHOLHDL, LDLDIRECT in the last 72 hours. Thyroid Function Tests: No results for input(s): TSH, T4TOTAL, FREET4, T3FREE, THYROIDAB in the last 72 hours. Anemia Panel: No results for input(s): VITAMINB12, FOLATE, FERRITIN, TIBC, IRON, RETICCTPCT in the last 72 hours. Sepsis Labs: Recent Labs  Lab 05/26/21 1718 05/26/21 1910 05/26/21 2023 05/27/21 0616  PROCALCITON  --   --  <0.10 <0.10  LATICACIDVEN 3.0* 2.2*  --   --     Recent Results (from the past 240 hour(s))  Resp Panel by RT-PCR (Flu A&B, Covid) Nasopharyngeal Swab     Status: None   Collection Time: 05/26/21  5:18 PM   Specimen: Nasopharyngeal Swab; Nasopharyngeal(NP) swabs in vial transport medium  Result Value Ref Range Status   SARS Coronavirus 2 by RT PCR NEGATIVE NEGATIVE Final    Comment: (NOTE) SARS-CoV-2 target nucleic acids are NOT DETECTED.  The SARS-CoV-2 RNA  is generally detectable in upper respiratory specimens during the acute phase of infection. The lowest concentration of SARS-CoV-2 viral copies this assay can detect is 138 copies/mL. A negative result does not preclude SARS-Cov-2 infection and should not be used as the sole basis for treatment or other patient management decisions. A negative result may occur with  improper specimen collection/handling, submission of specimen other than nasopharyngeal swab, presence of viral mutation(s) within the areas targeted by this assay, and inadequate number of viral copies(<138 copies/mL). A negative result must be combined with clinical  observations, patient history, and epidemiological information. The expected result is Negative.  Fact Sheet for Patients:  EntrepreneurPulse.com.au  Fact Sheet for Healthcare Providers:  IncredibleEmployment.be  This test is no t yet approved or cleared by the [place] FDA and  has been authorized for detection and/or diagnosis of SARS-CoV-2 by FDA under an Emergency Use Authorization (EUA). This EUA will remain  in effect (meaning this test can be used) for the duration of the COVID-19 declaration under Section 564(b)(1) of the Act, 21 U.S.C.section 360bbb-3(b)(1), unless the authorization is terminated  or revoked sooner.       Influenza A by PCR NEGATIVE NEGATIVE Final   Influenza B by PCR NEGATIVE NEGATIVE Final    Comment: (NOTE) The Xpert Xpress SARS-CoV-2/FLU/RSV plus assay is intended as an aid in the diagnosis of influenza from Nasopharyngeal swab specimens and should not be used as a sole basis for treatment. Nasal washings and aspirates are unacceptable for Xpert Xpress SARS-CoV-2/FLU/RSV testing.  Fact Sheet for Patients: EntrepreneurPulse.com.au  Fact Sheet for Healthcare Providers: IncredibleEmployment.be  This test is not yet approved or cleared by the  [place] FDA and has been authorized for detection and/or diagnosis of SARS-CoV-2 by FDA under an Emergency Use Authorization (EUA). This EUA will remain in effect (meaning this test can be used) for the duration of the COVID-19 declaration under Section 564(b)(1) of the Act, 21 U.S.C. section 360bbb-3(b)(1), unless the authorization is terminated or revoked.  Performed at [facility], [address] [place], [place] [address]   Blood Culture (routine x 2)     Status: None (Preliminary result)   Collection Time: 05/26/21  6:18 PM   Specimen: BLOOD  Result Value Ref Range Status   Specimen Description BLOOD BLOOD RIGHT HAND  Final   Special Requests   Final    BOTTLES DRAWN AEROBIC AND ANAEROBIC Blood Culture adequate volume   Culture   Final    NO GROWTH < 12 HOURS Performed at [facility], [address] [place], [place] [address]    Report Status PENDING  Incomplete  Blood Culture (routine x 2)     Status: None (Preliminary result)   Collection Time: 05/26/21  6:25 PM   Specimen: BLOOD  Result Value Ref Range Status   Specimen Description BLOOD BLOOD RIGHT FOREARM  Final   Special Requests   Final    BOTTLES DRAWN AEROBIC AND ANAEROBIC Blood Culture adequate volume   Culture   Final    NO GROWTH < 12 HOURS Performed at [facility], [address], [place], [place] [address]    Report Status PENDING  Incomplete      Radiology Studies: DG Chest 2 View  Result Date: 05/26/2021 CLINICAL DATA:  Hypoglycemia. EXAM: CHEST - 2 VIEW COMPARISON:  04/18/2021, chest CT 04/19/2020 FINDINGS: Lungs are adequately inflated with slight increased density over the right hilar/perihilar region seen only on the frontal film without significant change from the recent prior exam. No effusion. Cardiomediastinal silhouette and remainder of the exam is unchanged. IMPRESSION: 1. No acute cardiopulmonary disease. 2. Stable mild increased density over the right  hilar/perihilar region likely vascular. Recommend attention on follow-up. Electronically Signed   By: Marin Olp M.D.   On: 05/26/2021 18:50     Scheduled Meds:  atorvastatin  80 mg Oral Daily   darifenacin  7.5 mg Oral Daily   docusate sodium  100 mg Oral BID   donepezil  10 mg Oral Daily   enoxaparin (  LOVENOX) injection  40 mg Subcutaneous Q24H   insulin aspart  0-15 Units Subcutaneous TID WC   insulin glargine-yfgn  34 Units Subcutaneous Daily   montelukast  10 mg Oral QHS   tamsulosin  0.4 mg Oral Daily   Continuous Infusions:  lactated ringers 100 mL/hr at 05/27/21 1828     LOS: 0 days     [name], MD [facility] If 7PM-7AM, please contact night-coverage 05/27/2021, 7:47 PM

## 2021-05-27 NOTE — Progress Notes (Signed)
Patient refused lovenox SQ, patient educated, hospitalist made aware, will continue to monitor. ?

## 2021-05-27 NOTE — Progress Notes (Signed)
Nutrition Brief Note ? ?Patient identified on the Malnutrition Screening Tool (MST) Report ? ?Wt Readings from Last 15 Encounters:  ?05/26/21 74.4 kg  ?05/07/21 78.9 kg  ?04/21/21 79 kg  ?04/08/21 81.6 kg  ?11/14/20 80.3 kg  ?10/28/20 81.6 kg  ?09/05/20 79.8 kg  ?08/28/20 78.7 kg  ?08/01/20 80.3 kg  ?07/21/20 73.5 kg  ?04/18/20 78 kg  ?01/30/20 77.5 kg  ?11/28/19 80.3 kg  ?10/23/19 81.2 kg  ?08/25/19 81.6 kg  ? ?Allen Hunter. is a 81 y.o. male with medical history significant of DM II, HTN, Asthma brought to ed via for hyperglycemia at home. Pt is ambulatory at baseline. Pt states he does not remember any of his symptoms.He denies any dizziness or chest pain.  ? ?Pt admitted with hyperglycemia.  ? ?Reviewed I/O's: +1.7 L x 24 hours ? ?Spoke with pt at bedside, who reports he wants to go home. He reports feeling better and having a good appetite. He consumed 100% of his breakfast this morning. Pt reports he usually consumes 2 meals per day (Breakfast: eggs and potatoes, Dinner: meat, starch, and vegetables). Pt consumes water and the occasional diet soda.  ? ?Pt reports that his blood sugars have been "high" over the past few weeks. He denies any changes to his diet or medications. He shares that he often forgets to take his medications, but unable to tell this RD which medications he takes (DM is managed through his PCP office and he does not have trouble obtaining medications). Discussed ways to help pt remember to take his medications.  ? ?Pt denies any weight loss.  ? ?Nutrition-Focused physical exam completed. Findings are no fat depletion, no muscle depletion, and mild edema.   ? ?Medications reviewed and include lactated ringers infusion @ 100 ml/hr.  ? ?Lab Results  ?Component Value Date  ? HGBA1C 8.7 (H) 04/08/2021  ? PTA DM medications are 1000 mg metformin BID, 5 mg glipizide BID, 34 units insulin glargine daily, and 25 mg jardiance daily.  ? ?Labs reviewed: CBGS: [id number] (inpatient orders for glycemic  control are 0-15 units insulin aspart TID with meals and 34 units insulin glargine-yfgn daily).   ? ?Current diet order is heart healthy/ carb modified, patient is consuming approximately 100% of meals at this time. Labs and medications reviewed.  ? ?No nutrition interventions warranted at this time. If nutrition issues arise, please consult RD.  ? ?Loistine Chance, RD, LDN, CDCES ?Registered Dietitian II ?Certified Diabetes Care and Education Specialist ?Please refer to [facility] for RD and/or RD on-call/weekend/after hours pager   ?

## 2021-05-27 NOTE — TOC Initial Note (Signed)
Transition of Care (TOC) - Initial/Assessment Note  ? ? ?Patient Details  ?Name: Allen Hunter. ?MRN: [id number] ?Date of Birth: [birth year] ? ?Transition of Care (TOC) CM/SW Contact:    ?Allen Sam, LCSW ?Phone Number: ?05/27/2021, 8:53 AM ? ?Clinical Narrative:                 ? ?Patient known due to recent admissions, per chart review hx patient is from home where he lives alone, he says that he is independent and drives.  ? ?He has sisters that live near by.  Patient walks with a walker.  He currently has [facility] coming out, RN, PT, and OT.   ? ?Patient's sister Allen Hunter to pick him up at discharge and transport him home.   ? ?TOC will continue to follow for needs.  ? ?Expected Discharge Plan: [place] ?Barriers to Discharge: Continued Medical Work up ? ? ?Patient Goals and CMS Choice ?Patient states their goals for this hospitalization and ongoing recovery are:: to go home ?CMS Medicare.gov Compare Post Acute Care list provided to:: Patient ?Choice offered to / list presented to : Patient ? ?Expected Discharge Plan and Services ?Expected Discharge Plan: [place] ?  ?  ?  ?Living arrangements for the past 2 months: [place] ?                ?  ?  ?  ?  ?  ?  ?  ?  ?  ?  ? ?Prior Living Arrangements/Services ?Living arrangements for the past 2 months: [place] ?Lives with:: Self ?  ?       ?  ?  ?  ?  ? ?Activities of Daily Living ?Home Assistive Devices/Equipment: None ?ADL Screening (condition at time of admission) ?Patient's cognitive ability adequate to safely complete daily activities?: Yes ?Is the patient deaf or have difficulty hearing?: No ?Does the patient have difficulty seeing, even when wearing glasses/contacts?: No ?Does the patient have difficulty concentrating, remembering, or making decisions?: No ?Patient able to express need for assistance with ADLs?: Yes ?Does the patient have difficulty dressing or bathing?:  No ?Independently performs ADLs?: Yes (appropriate for developmental age) ?Does the patient have difficulty walking or climbing stairs?: No ?Weakness of Legs: None ?Weakness of Arms/Hands: None ? ?Permission Sought/Granted ?  ?  ?   ?   ?   ?   ? ?Emotional Assessment ?  ?  ?  ?Orientation: : Oriented to Self, Oriented to Place, Oriented to  Time, Oriented to Situation ?Alcohol / Substance Use: Not Applicable ?Psych Involvement: No (comment) ? ?Admission diagnosis:  Dehydration [E86.0] ?Lactic acidosis [E87.20] ?Hyperglycemia [R73.9] ?SIRS (systemic inflammatory response syndrome) (HCC) [R65.10] ?AKI (acute kidney injury) ([place]) [N17.9] ?Hyperglycemia due to diabetes mellitus ([place]) [E11.65] ?Patient Active Problem List  ? Diagnosis Date Noted  ? Hyperglycemia due to diabetes mellitus ([place]) 05/26/2021  ? Severe sepsis ([place]) 05/26/2021  ? Type 2 diabetes mellitus with hyperglycemia ([place]) 05/26/2021  ? AKI (acute kidney injury) ([place]) 05/08/2021  ? Acute metabolic encephalopathy 27/51/7001  ? Exacerbation of asthma 11/17/2020  ? OAB (overactive bladder) 11/17/2020  ? Mild cognitive impairment 08/01/2020  ? DKA (diabetic ketoacidosis) ([place]) 07/20/2020  ? Hyponatremia 07/20/2020  ? Dehydration 07/20/2020  ? Constipation 05/10/2019  ? Chronic diastolic CHF (congestive heart failure) ([place]) 11/15/2018  ? DOE (dyspnea on exertion) 05/17/2018  ? OSA (obstructive sleep apnea) 07/19/2015  ? Asthma  with acute exacerbation 04/10/2014  ? Unspecified asthma, with exacerbation 09/05/2013  ? GERD (gastroesophageal reflux disease) 03/01/2012  ? Diarrhea 03/01/2012  ? Hypersomnolence 01/19/2012  ? Prostate cancer ([place]) 01/19/2012  ? 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 Increased prostate specific antigen (PSA) velocity 01/21/2011  ? Erectile dysfunction 01/20/2011  ? Bladder neck obstruction 01/20/2011  ? Encounter for well adult exam with abnormal findings 01/18/2011  ? Localized swelling, mass and lump, head 01/09/2010  ? Allergic rhinitis  10/04/2008  ? Asthma 10/04/2008  ? BACK PAIN 04/18/2008  ? COPD (chronic obstructive pulmonary disease) ([place]) 10/20/2007  ? Fatigue 10/05/2007  ? Diabetes ([place]) 03/09/2007  ? BPH (benign prostatic hyperplasia) 03/09/2007  ? COLONIC POLYPS, HX OF 03/09/2007  ? Hyperlipidemia 10/19/2006  ? GOUT 10/19/2006  ? Essential hypertension 10/19/2006  ? ?PCP:  Allen Borg, MD ?Pharmacy:   ?[facility] [place] ?[address] ?[place] [address] ?Phone: [phone] Fax: [phone] ? ?[id number] [address], [address] ?[place] [address] ?Phone: [phone] Fax: [phone] ? ? ? ? ?Social Determinants of Health (SDOH) Interventions ?  ? ?Readmission Risk Interventions ?Readmission Risk Prevention Plan 05/08/2021 04/21/2021  ?Transportation Screening Complete Complete  ?PCP or Specialist Appt within 3-5 Days Complete Complete  ?[place] or Home Care Consult Complete Complete  ?Social Work Consult for [place] Planning/Counseling Complete Complete  ?Palliative Care Screening Not Applicable Not Applicable  ?Medication Review Press photographer) Complete Complete  ?Some recent data might be hidden  ? ? ? ?

## 2021-05-27 NOTE — Progress Notes (Signed)
Inpatient Diabetes Program Recommendations ? ?AACE/ADA: New Consensus Statement on Inpatient Glycemic Control (2015) ? ?Target Ranges:  Prepandial:   less than 140 mg/dL ?     Peak postprandial:   less than 180 mg/dL (1-2 hours) ?     Critically ill patients:  140 - 180 mg/dL  ? ? Latest Reference Range & Units 05/26/21 12:28 05/26/21 14:10 05/26/21 14:39 05/26/21 21:32  ?Glucose-Capillary 70 - 99 mg/dL 511 (HH) 470 (H) ? ?12 units Novolog ? 590 (HH) ? ?10 units Novolog '@1552'$  349 (H) ? ?4 units Novolog ?'@2228'$   ? ? ?To ED with Hyperglycemia ? ?History: DM2  ? ?Home DM Meds: Glipizide 2.5 mg BID  ?  Metformin 1000 mg BID ?  Lantus 34 units QHS  ? ?Current Orders: Novolog Sensitive Correction Scale/ SSI (0-9 units) TID AC + HS ? ? ?Diabetes Coordinator counseled this pt in Jan 2023  ? ? ? ?MD- Note 74am BMET shows the following:  ?Glucose 353 ?Anion Gap 17 ?CO2 level 19 ? ?Does pt need more IVF?   ? ?Also, pt taking Lantus insulin at home.  No basal insulin ordered at present. ? ?Please consider starting Semglee 34 units Daily (please start this AM) [Home dose] ? ? ? ? ?--Will follow patient during hospitalization-- ? ?[name] RN, MSN, CDE ?Diabetes Coordinator ?Inpatient Glycemic Control Team ?Team Pager: [phone] (8a-5p) ? ? ? ? ? ?

## 2021-05-28 DIAGNOSIS — E1165 Type 2 diabetes mellitus with hyperglycemia: Secondary | ICD-10-CM | POA: Diagnosis not present

## 2021-05-28 LAB — CBC
HCT: 35.4 % — ABNORMAL LOW (ref 39.0–52.0)
Hemoglobin: 11.4 g/dL — ABNORMAL LOW (ref 13.0–17.0)
MCH: 29.9 pg (ref 26.0–34.0)
MCHC: 32.2 g/dL (ref 30.0–36.0)
MCV: 92.9 fL (ref 80.0–100.0)
Platelets: 130 10*3/uL — ABNORMAL LOW (ref 150–400)
RBC: 3.81 MIL/uL — ABNORMAL LOW (ref 4.22–5.81)
RDW: 16.9 % — ABNORMAL HIGH (ref 11.5–15.5)
WBC: 3.5 10*3/uL — ABNORMAL LOW (ref 4.0–10.5)
nRBC: 0 % (ref 0.0–0.2)

## 2021-05-28 LAB — BASIC METABOLIC PANEL
Anion gap: 3 — ABNORMAL LOW (ref 5–15)
BUN: 20 mg/dL (ref 8–23)
CO2: 26 mmol/L (ref 22–32)
Calcium: 9.1 mg/dL (ref 8.9–10.3)
Chloride: 110 mmol/L (ref 98–111)
Creatinine, Ser: 1.01 mg/dL (ref 0.61–1.24)
GFR, Estimated: >60 mL/min (ref >60)
Glucose, Bld: 295 mg/dL — ABNORMAL HIGH (ref 70–99)
Potassium: 5 mmol/L (ref 3.5–5.1)
Sodium: 139 mmol/L (ref 135–145)

## 2021-05-28 LAB — GLUCOSE, CAPILLARY
Glucose-Capillary: 250 mg/dL — ABNORMAL HIGH (ref 70–99)
Glucose-Capillary: 399 mg/dL — ABNORMAL HIGH (ref 70–99)

## 2021-05-28 LAB — MAGNESIUM: Magnesium: 1.6 mg/dL — ABNORMAL LOW (ref 1.7–2.4)

## 2021-05-28 MED ORDER — INSULIN GLARGINE 100 UNIT/ML SOLOSTAR PEN: 25.0000 [IU] | PEN_INJECTOR | Freq: Two times a day (BID) | SUBCUTANEOUS | 3 refills | Status: AC

## 2021-05-28 MED ORDER — INSULIN GLARGINE-YFGN 100 UNIT/ML ~~LOC~~ SOLN
25.0000 [IU] | Freq: Two times a day (BID) | SUBCUTANEOUS | Status: DC
  Administered 2021-05-28: 25 [IU] via SUBCUTANEOUS
  Filled 2021-05-28 (×2): qty 0.25

## 2021-05-28 MED ORDER — INSULIN ASPART 100 UNIT/ML IJ SOLN
6.0000 [IU] | Freq: Three times a day (TID) | INTRAMUSCULAR | Status: DC
  Administered 2021-05-28: 6 [IU] via SUBCUTANEOUS
  Filled 2021-05-28: qty 1

## 2021-05-28 MED ORDER — MAGNESIUM SULFATE 2 GM/50ML IV SOLN
2.0000 g | Freq: Once | INTRAVENOUS | Status: AC
  Administered 2021-05-28: 2 g via INTRAVENOUS
  Filled 2021-05-28: qty 50

## 2021-05-28 MED ORDER — INSULIN ASPART 100 UNIT/ML IJ SOLN
20.0000 [IU] | Freq: Once | INTRAMUSCULAR | Status: AC
  Administered 2021-05-28: 20 [IU] via SUBCUTANEOUS
  Filled 2021-05-28: qty 1

## 2021-05-28 MED ORDER — METFORMIN HCL 1000 MG PO TABS: 1000.0000 mg | ORAL_TABLET | Freq: Two times a day (BID) | ORAL | 3 refills | Status: AC

## 2021-05-28 NOTE — Discharge Summary (Signed)
Physician Discharge Summary   Patient: Allen Hunter. MRN: [id number] DOB: 07/12/1940  Admit date:     05/26/2021  Discharge date: 05/28/21  Discharge Physician: Lorella Nimrod   PCP: [name], MD   Recommendations at discharge:  Patient need encouragement about healthy, carb modified meals. Follow-up with primary care provider within a week for Management of his uncontrolled diabetes.  Discharge Diagnoses: Principal Problem:   Hyperglycemia due to diabetes mellitus ([place]) Active Problems:   Diabetes (HCC)   Chronic diastolic CHF (congestive heart failure) (HCC)   AKI (acute kidney injury) ([place])   DKA (diabetic ketoacidosis) (HCC)   Severe sepsis (HCC)   Essential hypertension   Hyperglycemia  Hospital Course: Allen Hunter. is a 81 y.o. male with medical history significant of DM II, HTN, Asthma brought to ed for hyperglycemia at home. Patient was not taking his Lantus at home.  He lives alone and eat a lot of fast food. He was counseled extensively.  We made some changes to his Lantus scheduled, Madie 25 units twice daily and advised him to regularly use it.  He was admitted for hyperglycemia, no DKA. Patient had poorly controlled diabetes mellitus with hyperglycemia and A1c of 8.7.  He needs a close follow-up with primary care provider for further management of his diabetes.  He was also found to have borderline soft blood pressure on presentation, most likely secondary to osmotic diuresis and volume depletion.  Blood pressure improved with IV fluids.  He was told to keep himself well-hydrated.  We will stop home bisoprolol and he can resume Lantus from tomorrow. He will need a close follow-up with primary care provider.  Sepsis was mentioned but it was ruled out.  He was given antibiotics in the ED, they were not continued.  Patient will continue the rest of his home medications except mentioned above and follow-up with his providers.  Consultants: None Procedures  performed: None Disposition: Home health Diet recommendation:  Discharge Diet Orders (From admission, onward)     Start     Ordered   05/28/21 0000  Diet - low sodium heart healthy        05/28/21 1059           Cardiac and Carb modified diet DISCHARGE MEDICATION: Allergies as of 05/28/2021   No Known Allergies      Medication List     STOP taking these medications    bisoprolol 5 MG tablet Commonly known as: ZEBETA   Jardiance 25 MG Tabs tablet Generic drug: empagliflozin       TAKE these medications    allopurinol 300 MG tablet Commonly known as: ZYLOPRIM Take 1 tablet (300 mg total) by mouth daily.   aspirin 81 MG tablet Take 81 mg by mouth daily.   atorvastatin 80 MG tablet Commonly known as: LIPITOR Take 1 tablet (80 mg total) by mouth daily.   blood glucose meter kit and supplies Dispense based on patient and insurance preference. Use up to four times daily as directed. (FOR ICD-10 E10.9, E11.9).   donepezil 10 MG tablet Commonly known as: ARICEPT Take 1 tablet($RemoveBefor'10mg'mKdDFfkLobwL$ ) by mouth daily.   esomeprazole 40 MG capsule Commonly known as: NEXIUM Take 1 capsule (40 mg total) by mouth daily.   Fish Oil 500 MG Caps Take 1 capsule by mouth daily.   FREESTYLE LITE test strip Generic drug: glucose blood USE 1 STRIP TWICE A DAY AS NEEDED TO CHECK SUGAR   glipiZIDE 5 MG tablet Commonly  known as: GLUCOTROL TAKE ONE-HALF TABLET BY MOUTH TWO TIMES A DAY FOR DIABETES. TAKE 30 MINUTES BEFORE MEAL(S)   insulin glargine 100 UNIT/ML Solostar Pen Commonly known as: LANTUS Inject 25 Units into the skin 2 (two) times daily. What changed:  how much to take when to take this   Lancets Misc Use asd 1 per day 250.02   losartan 25 MG tablet Commonly known as: COZAAR Take 1 tablet (25 mg total) by mouth daily.   metFORMIN 1000 MG tablet Commonly known as: GLUCOPHAGE Take 1 tablet (1,000 mg total) by mouth 2 (two) times daily with a meal.   montelukast 10 MG  tablet Commonly known as: SINGULAIR Take 1 tablet (10 mg total) by mouth at bedtime.   solifenacin 5 MG tablet Commonly known as: VESICARE Take 1 tablet (5 mg total) by mouth daily.   tamsulosin 0.4 MG Caps capsule Commonly known as: Flomax Take 1 capsule (0.4 mg total) by mouth daily.        Follow-up Information     [name], MD. Schedule an appointment as soon as possible for a visit in 1 week(s).   Specialties: Internal Medicine, Radiology Contact information: [place] [address] [phone]         Nahser, Wonda Cheng, MD .   Specialty: Cardiology Contact information: [place] [address] [phone]                Discharge Exam: [name] Weights   05/26/21 1232 05/26/21 2133  Weight: 82.6 kg 74.4 kg   General.     In no acute distress. Pulmonary.  Lungs clear bilaterally, normal respiratory effort. CV.  Regular rate and rhythm, no JVD, rub or murmur. Abdomen.  Soft, nontender, nondistended, BS positive. CNS.  Alert and oriented .  No focal neurologic deficit. Extremities.  No edema, no cyanosis, pulses intact and symmetrical. Psychiatry.  Judgment and insight appears normal.   Condition at discharge: stable  The results of significant diagnostics from this hospitalization (including imaging, microbiology, ancillary and laboratory) are listed below for reference.   Imaging Studies: DG Chest 2 View  Result Date: 05/26/2021 CLINICAL DATA:  Hypoglycemia. EXAM: CHEST - 2 VIEW COMPARISON:  04/18/2021, chest CT 04/19/2020 FINDINGS: Lungs are adequately inflated with slight increased density over the right hilar/perihilar region seen only on the frontal film without significant change from the recent prior exam. No effusion. Cardiomediastinal silhouette and remainder of the exam is unchanged. IMPRESSION: 1. No acute cardiopulmonary disease. 2. Stable mild increased density over the right hilar/perihilar  region likely vascular. Recommend attention on follow-up. Electronically Signed   By: Marin Olp M.D.   On: 05/26/2021 18:50    Microbiology: Results for orders placed or performed during the hospital encounter of 05/26/21  Resp Panel by RT-PCR (Flu A&B, Covid) Nasopharyngeal Swab     Status: None   Collection Time: 05/26/21  5:18 PM   Specimen: Nasopharyngeal Swab; Nasopharyngeal(NP) swabs in vial transport medium  Result Value Ref Range Status   SARS Coronavirus 2 by RT PCR NEGATIVE NEGATIVE Final    Comment: (NOTE) SARS-CoV-2 target nucleic acids are NOT DETECTED.  The SARS-CoV-2 RNA is generally detectable in upper respiratory specimens during the acute phase of infection. The lowest concentration of SARS-CoV-2 viral copies this assay can detect is 138 copies/mL. A negative result does not preclude SARS-Cov-2 infection and should not be used as the sole basis for treatment or other patient management  decisions. A negative result may occur with  improper specimen collection/handling, submission of specimen other than nasopharyngeal swab, presence of viral mutation(s) within the areas targeted by this assay, and inadequate number of viral copies(<138 copies/mL). A negative result must be combined with clinical observations, patient history, and epidemiological information. The expected result is Negative.  Fact Sheet for Patients:  EntrepreneurPulse.com.au  Fact Sheet for Healthcare Providers:  IncredibleEmployment.be  This test is no t yet approved or cleared by the [place] FDA and  has been authorized for detection and/or diagnosis of SARS-CoV-2 by FDA under an Emergency Use Authorization (EUA). This EUA will remain  in effect (meaning this test can be used) for the duration of the COVID-19 declaration under Section 564(b)(1) of the Act, 21 U.S.C.section 360bbb-3(b)(1), unless the authorization is terminated  or revoked sooner.        Influenza A by PCR NEGATIVE NEGATIVE Final   Influenza B by PCR NEGATIVE NEGATIVE Final    Comment: (NOTE) The Xpert Xpress SARS-CoV-2/FLU/RSV plus assay is intended as an aid in the diagnosis of influenza from Nasopharyngeal swab specimens and should not be used as a sole basis for treatment. Nasal washings and aspirates are unacceptable for Xpert Xpress SARS-CoV-2/FLU/RSV testing.  Fact Sheet for Patients: EntrepreneurPulse.com.au  Fact Sheet for Healthcare Providers: IncredibleEmployment.be  This test is not yet approved or cleared by the [place] FDA and has been authorized for detection and/or diagnosis of SARS-CoV-2 by FDA under an Emergency Use Authorization (EUA). This EUA will remain in effect (meaning this test can be used) for the duration of the COVID-19 declaration under Section 564(b)(1) of the Act, 21 U.S.C. section 360bbb-3(b)(1), unless the authorization is terminated or revoked.  Performed at [facility], [address] [place], [place] [address]   Blood Culture (routine x 2)     Status: None (Preliminary result)   Collection Time: 05/26/21  6:18 PM   Specimen: BLOOD  Result Value Ref Range Status   Specimen Description BLOOD BLOOD RIGHT HAND  Final   Special Requests   Final    BOTTLES DRAWN AEROBIC AND ANAEROBIC Blood Culture adequate volume   Culture   Final    NO GROWTH 2 DAYS Performed at [facility], [address] [place], [place] [address]    Report Status PENDING  Incomplete  Blood Culture (routine x 2)     Status: None (Preliminary result)   Collection Time: 05/26/21  6:25 PM   Specimen: BLOOD  Result Value Ref Range Status   Specimen Description BLOOD BLOOD RIGHT FOREARM  Final   Special Requests   Final    BOTTLES DRAWN AEROBIC AND ANAEROBIC Blood Culture adequate volume   Culture   Final    NO GROWTH 2 DAYS Performed at [facility], [place], [place] [address]    Report Status PENDING  Incomplete    Labs: CBC: Recent Labs  Lab 05/26/21 1237 05/26/21 2140 05/27/21 0616 05/28/21 0611  WBC 3.7* 4.9 4.8 3.5*  HGB 14.1 13.0 13.6 11.4*  HCT 44.6 41.3 43.1 35.4*  MCV 95.5 94.9 96.0 92.9  PLT 175 167 160 938*   Basic Metabolic Panel: Recent Labs  Lab 05/26/21 1237 05/26/21 1718 05/26/21 2023 05/27/21 0616 05/27/21 1704 05/28/21 0611  NA 135 137  --  139 134* 139  K 5.2* 3.8  --  4.6 4.7 5.0  CL 99 107  --  103 103 110  CO2 25 25  --  19* 23 26  GLUCOSE 560* 336*  --  353* 384* 295*  BUN 21 22  --  21 24* 20  CREATININE 1.35* 1.39* 0.87 1.20 1.01 1.01  CALCIUM 9.5 8.6*  --  9.1 8.6* 9.1  MG  --  1.7  --   --   --  1.6*   Liver Function Tests: Recent Labs  Lab 05/26/21 2023  AST 13*  ALT 14  ALKPHOS 37*  BILITOT 0.8  PROT 3.3*  ALBUMIN 1.8*   CBG: Recent Labs  Lab 05/27/21 0729 05/27/21 1217 05/27/21 1706 05/27/21 2036 05/28/21 0825  GLUCAP 319* 344* 382* 277* 250*    Discharge time spent: greater than 30 minutes.  This record has been created using Systems analyst. Errors have been sought and corrected,but may not always be located. Such creation errors do not reflect on the standard of care.   Signed: Lorella Nimrod, MD [facility] 05/28/2021

## 2021-05-28 NOTE — TOC Progression Note (Signed)
Transition of Care (TOC) - Progression Note  ? ? ?Patient Details  ?Name: Allen Hunter. ?MRN: [id number] ?Date of Birth: Nov 30, 1940 ? ?Transition of Care (TOC) CM/SW Contact  ?Pete Pelt, RN ?Phone Number: ?05/28/2021, 1:23 PM ? ?Clinical Narrative:   Patient lives alone, states he has 2 nurses who are helpful and he can call when needed.  He drives to appointments but he can also call niece to assist.  Patient admits to occasions where he is non complaint with is meds and he states, " I guess I just forget, but I have a pillbox to remind me."  Discussed having a home health RN to assist.  Patient was agreeable to home health.  Georgina Snell at [facility] accepted patient for services. ? ?Patient declined PT, OT home health and states he has all DME at home at  this time.   ? ?Patient states he is safe and comfortable with being discharged home and he will work with home health to stay healthy as possible. ? ? ? ?Expected Discharge Plan: [place] ?Barriers to Discharge: Continued Medical Work up ? ?Expected Discharge Plan and Services ?Expected Discharge Plan: [place] ?  ?  ?  ?Living arrangements for the past 2 months: [place] ?Expected Discharge Date: 05/28/21               ?  ?  ?  ?  ?  ?  ?  ?  ?  ?  ? ? ?Social Determinants of Health (SDOH) Interventions ?  ? ?Readmission Risk Interventions ?Readmission Risk Prevention Plan 05/08/2021 04/21/2021  ?Transportation Screening Complete Complete  ?PCP or Specialist Appt within 3-5 Days Complete Complete  ?[place] or Home Care Consult Complete Complete  ?Social Work Consult for [place] Planning/Counseling Complete Complete  ?Palliative Care Screening Not Applicable Not Applicable  ?Medication Review Press photographer) Complete Complete  ?Some recent data might be hidden  ? ? ?

## 2021-05-29 ENCOUNTER — Inpatient Hospital Stay: Payer: Medicare HMO | Admitting: Internal Medicine

## 2021-05-29 ENCOUNTER — Telehealth: Payer: Self-pay

## 2021-05-29 LAB — BLOOD GAS, VENOUS
Acid-base deficit: 2.1 mmol/L — ABNORMAL HIGH (ref 0.0–2.0)
Bicarbonate: 26 mmol/L (ref 20.0–28.0)
Patient temperature: 37
pCO2, Ven: 58 mmHg (ref 44–60)
pH, Ven: 7.26 (ref 7.25–7.43)

## 2021-05-29 NOTE — Telephone Encounter (Signed)
Transition Care Management Unsuccessful Follow-up Telephone Call ? ?Date of discharge and from where:  TCM [facility] 05-28-21 Dx: hyperglycemia due to DM  ? ?Attempts:  1st Attempt ? ?Reason for unsuccessful TCM follow-up call:  Unable to leave message ? ?  ?

## 2021-05-31 LAB — CULTURE, BLOOD (ROUTINE X 2)
Culture: NO GROWTH
Culture: NO GROWTH
Special Requests: ADEQUATE
Special Requests: ADEQUATE

## 2021-06-02 ENCOUNTER — Other Ambulatory Visit: Payer: Self-pay | Admitting: Internal Medicine

## 2021-06-04 ENCOUNTER — Encounter: Payer: Self-pay | Admitting: Psychology

## 2021-06-04 ENCOUNTER — Encounter: Payer: Medicare HMO | Admitting: Psychology

## 2021-06-04 DIAGNOSIS — Z029 Encounter for administrative examinations, unspecified: Secondary | ICD-10-CM

## 2021-06-05 ENCOUNTER — Telehealth: Payer: Self-pay | Admitting: Internal Medicine

## 2021-06-05 NOTE — Telephone Encounter (Signed)
Ok for verbal 

## 2021-06-05 NOTE — Telephone Encounter (Signed)
Tammy with [facility] calls today in regards to PT. Tammy needs a verbal order to be able to see PT! ? ?CB: [phone] ?

## 2021-06-06 NOTE — Telephone Encounter (Signed)
Verbals given  

## 2021-06-11 ENCOUNTER — Telehealth: Payer: Self-pay

## 2021-06-11 ENCOUNTER — Encounter: Payer: Medicare HMO | Admitting: Psychology

## 2021-06-11 NOTE — Telephone Encounter (Signed)
Allen Hunter is wanting a CB regarding assistance for the pt ? ?[phone] ?

## 2021-06-12 NOTE — Telephone Encounter (Signed)
Unable to reach Allen Hunter or leave a voicemail ?

## 2021-06-16 ENCOUNTER — Ambulatory Visit: Payer: Medicare HMO | Admitting: Internal Medicine

## 2021-06-17 ENCOUNTER — Ambulatory Visit: Payer: Medicare HMO | Admitting: Physician Assistant

## 2021-06-18 ENCOUNTER — Inpatient Hospital Stay: Payer: Medicare HMO | Admitting: Internal Medicine

## 2021-06-20 DIAGNOSIS — I509 Heart failure, unspecified: Secondary | ICD-10-CM

## 2021-06-20 DIAGNOSIS — T2017XA Burn of first degree of neck, initial encounter: Secondary | ICD-10-CM | POA: Diagnosis not present

## 2021-06-20 DIAGNOSIS — E1165 Type 2 diabetes mellitus with hyperglycemia: Secondary | ICD-10-CM | POA: Diagnosis not present

## 2021-06-25 DIAGNOSIS — N1831 Chronic kidney disease, stage 3a: Secondary | ICD-10-CM | POA: Diagnosis not present

## 2021-06-26 ENCOUNTER — Ambulatory Visit: Payer: Medicare HMO | Admitting: *Deleted

## 2021-06-26 DIAGNOSIS — I10 Essential (primary) hypertension: Secondary | ICD-10-CM

## 2021-06-26 DIAGNOSIS — E1165 Type 2 diabetes mellitus with hyperglycemia: Secondary | ICD-10-CM

## 2021-06-26 NOTE — Chronic Care Management (AMB) (Signed)
? ?  06/26/2021 ? ?Allen Hunter. ?07/18/1940 ?[id number] ? ?CCM RN CM Case Closure note: ?Prior to contacting patient for scheduled follow up call, review of EHR indicates that patient is no longer coming to [facility] for [facility]- case closure with completion of care plan was completed accordingly ? ?[name], RN, BSN, CCRN Alumnus ?[place] ?[phone]: direct office ? ?

## 2021-07-11 DIAGNOSIS — E1122 Type 2 diabetes mellitus with diabetic chronic kidney disease: Secondary | ICD-10-CM | POA: Diagnosis not present

## 2021-07-11 DIAGNOSIS — N400 Enlarged prostate without lower urinary tract symptoms: Secondary | ICD-10-CM | POA: Diagnosis not present

## 2021-07-11 DIAGNOSIS — I129 Hypertensive chronic kidney disease with stage 1 through stage 4 chronic kidney disease, or unspecified chronic kidney disease: Secondary | ICD-10-CM | POA: Diagnosis not present

## 2021-07-11 DIAGNOSIS — N2889 Other specified disorders of kidney and ureter: Secondary | ICD-10-CM | POA: Diagnosis not present

## 2021-07-11 DIAGNOSIS — E875 Hyperkalemia: Secondary | ICD-10-CM | POA: Diagnosis not present

## 2021-07-11 DIAGNOSIS — N1831 Chronic kidney disease, stage 3a: Secondary | ICD-10-CM | POA: Diagnosis not present

## 2021-07-18 DIAGNOSIS — L83 Acanthosis nigricans: Secondary | ICD-10-CM | POA: Diagnosis not present

## 2021-07-18 DIAGNOSIS — Z8639 Personal history of other endocrine, nutritional and metabolic disease: Secondary | ICD-10-CM | POA: Diagnosis not present

## 2021-07-29 DIAGNOSIS — M1A9XX0 Chronic gout, unspecified, without tophus (tophi): Secondary | ICD-10-CM | POA: Diagnosis not present

## 2021-07-29 DIAGNOSIS — E782 Mixed hyperlipidemia: Secondary | ICD-10-CM | POA: Diagnosis not present

## 2021-07-29 DIAGNOSIS — E559 Vitamin D deficiency, unspecified: Secondary | ICD-10-CM | POA: Diagnosis not present

## 2021-07-29 DIAGNOSIS — E1165 Type 2 diabetes mellitus with hyperglycemia: Secondary | ICD-10-CM | POA: Diagnosis not present

## 2021-07-29 DIAGNOSIS — D519 Vitamin B12 deficiency anemia, unspecified: Secondary | ICD-10-CM | POA: Diagnosis not present

## 2021-07-29 DIAGNOSIS — I10 Essential (primary) hypertension: Secondary | ICD-10-CM | POA: Diagnosis not present

## 2021-07-29 DIAGNOSIS — R5383 Other fatigue: Secondary | ICD-10-CM | POA: Diagnosis not present

## 2021-08-05 DIAGNOSIS — L818 Other specified disorders of pigmentation: Secondary | ICD-10-CM | POA: Diagnosis not present

## 2021-08-12 DIAGNOSIS — K219 Gastro-esophageal reflux disease without esophagitis: Secondary | ICD-10-CM | POA: Diagnosis not present

## 2021-08-12 DIAGNOSIS — E782 Mixed hyperlipidemia: Secondary | ICD-10-CM | POA: Diagnosis not present

## 2021-08-12 DIAGNOSIS — M1A9XX0 Chronic gout, unspecified, without tophus (tophi): Secondary | ICD-10-CM | POA: Diagnosis not present

## 2021-08-12 DIAGNOSIS — I10 Essential (primary) hypertension: Secondary | ICD-10-CM | POA: Diagnosis not present

## 2021-08-12 DIAGNOSIS — E1165 Type 2 diabetes mellitus with hyperglycemia: Secondary | ICD-10-CM | POA: Diagnosis not present

## 2021-08-13 DIAGNOSIS — C61 Malignant neoplasm of prostate: Secondary | ICD-10-CM | POA: Diagnosis not present

## 2021-08-15 DIAGNOSIS — R918 Other nonspecific abnormal finding of lung field: Secondary | ICD-10-CM | POA: Diagnosis not present

## 2021-08-15 DIAGNOSIS — N289 Disorder of kidney and ureter, unspecified: Secondary | ICD-10-CM | POA: Diagnosis not present

## 2021-08-15 DIAGNOSIS — D49512 Neoplasm of unspecified behavior of left kidney: Secondary | ICD-10-CM | POA: Diagnosis not present

## 2021-08-15 DIAGNOSIS — J9809 Other diseases of bronchus, not elsewhere classified: Secondary | ICD-10-CM | POA: Diagnosis not present

## 2021-08-15 DIAGNOSIS — J439 Emphysema, unspecified: Secondary | ICD-10-CM | POA: Diagnosis not present

## 2021-08-20 DIAGNOSIS — N401 Enlarged prostate with lower urinary tract symptoms: Secondary | ICD-10-CM | POA: Diagnosis not present

## 2021-08-20 DIAGNOSIS — R3912 Poor urinary stream: Secondary | ICD-10-CM | POA: Diagnosis not present

## 2021-08-20 DIAGNOSIS — D49512 Neoplasm of unspecified behavior of left kidney: Secondary | ICD-10-CM | POA: Diagnosis not present

## 2021-08-20 DIAGNOSIS — C61 Malignant neoplasm of prostate: Secondary | ICD-10-CM | POA: Diagnosis not present

## 2021-08-31 ENCOUNTER — Encounter: Payer: Self-pay | Admitting: Cardiovascular Disease

## 2021-08-31 NOTE — Progress Notes (Signed)
This encounter was created in error - please disregard.

## 2021-09-01 ENCOUNTER — Encounter: Payer: Medicare HMO | Admitting: Cardiovascular Disease

## 2021-09-20 DEATH — deceased

## 2021-10-06 ENCOUNTER — Ambulatory Visit: Payer: Medicare HMO | Admitting: Internal Medicine

## 2021-11-25 ENCOUNTER — Encounter: Payer: Self-pay | Admitting: Cardiovascular Disease

## 2023-05-07 IMAGING — CT CT HEAD W/O CM
4 series · 16 of 47 positions shown, 18 images · non-contrast
Comparison: 07/20/2020, 09/24/2020

CLINICAL DATA: Altered level of consciousness, shortness of breath
and cough



[Series 2: head wo · axial · 0.44mm/px · z∈[+303,+423]mm · 7 of 32 slices shown, 9 images]
[im 4/32  brain]
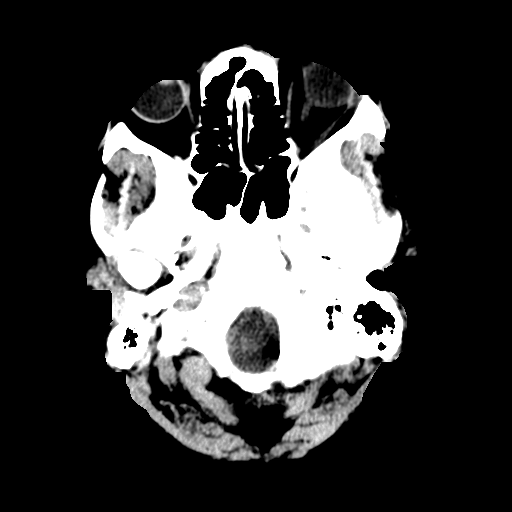
[im 4/32  bone]
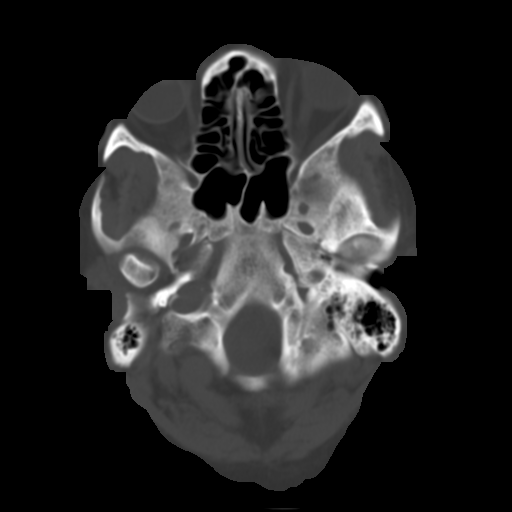
[im 8/32  brain]
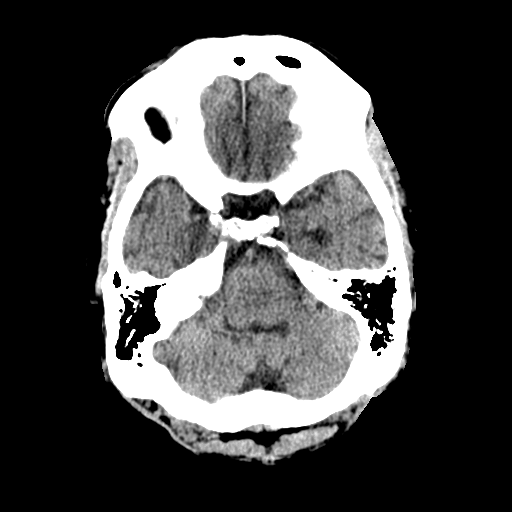
[im 12/32  brain]
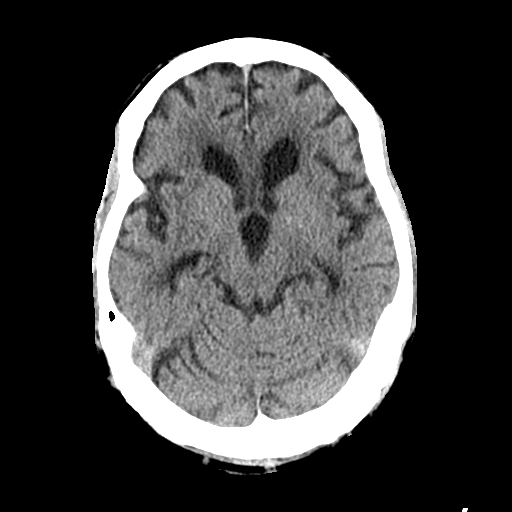
[im 16/32  brain]
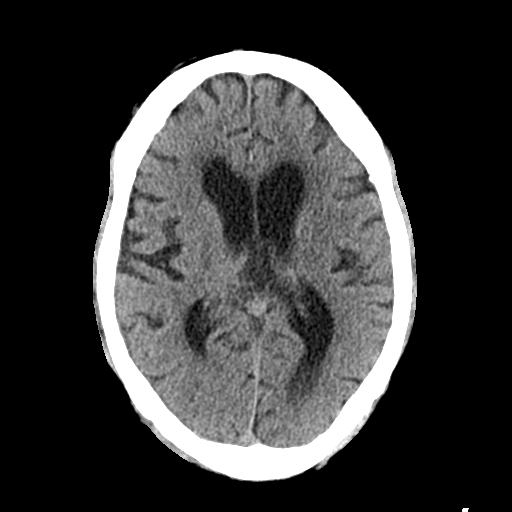
[im 20/32  brain]
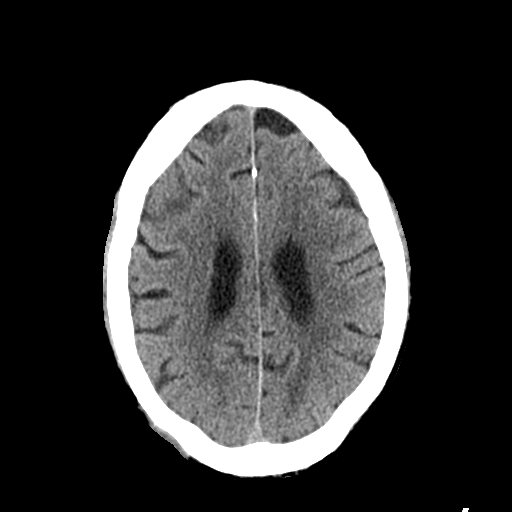
[im 20/32  bone]
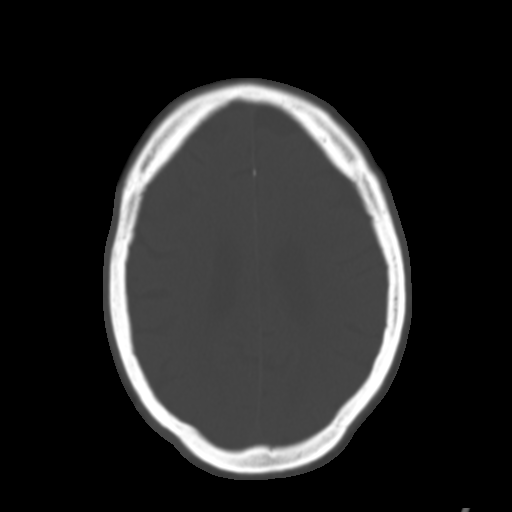
[im 24/32  brain]
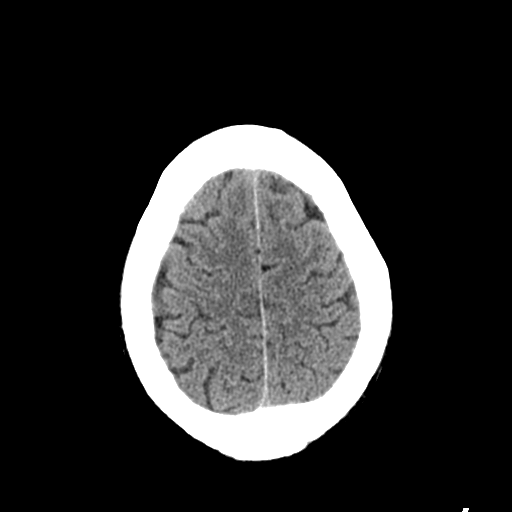
[im 28/32  brain]
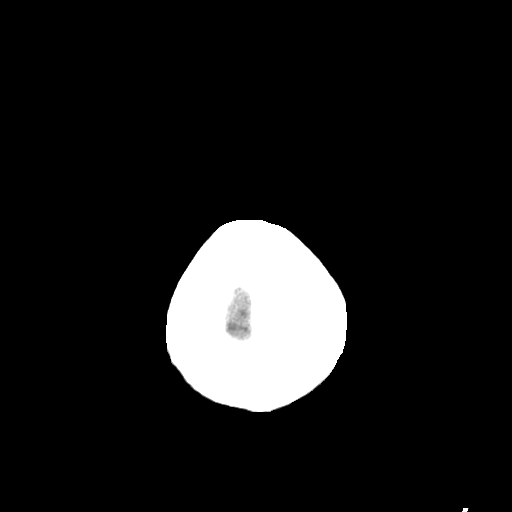

[Series 3: head bone · axial · 0.44mm/px · z∈[+302,+334]mm · 3 of 80 slices shown]
[im 8/80  bone]
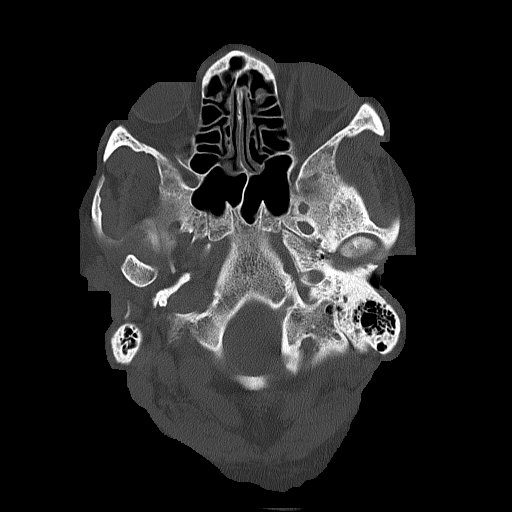
[im 16/80  bone]
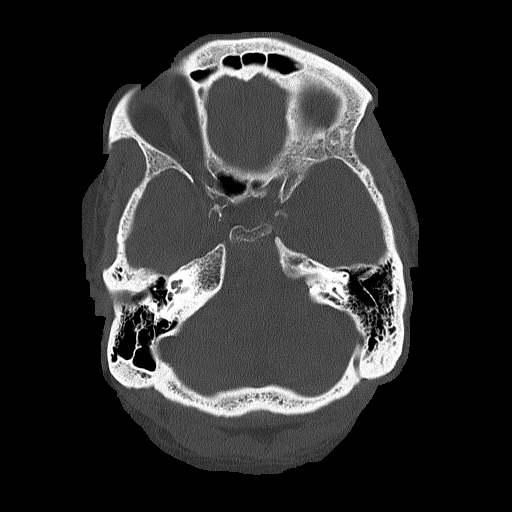
[im 24/80  bone]
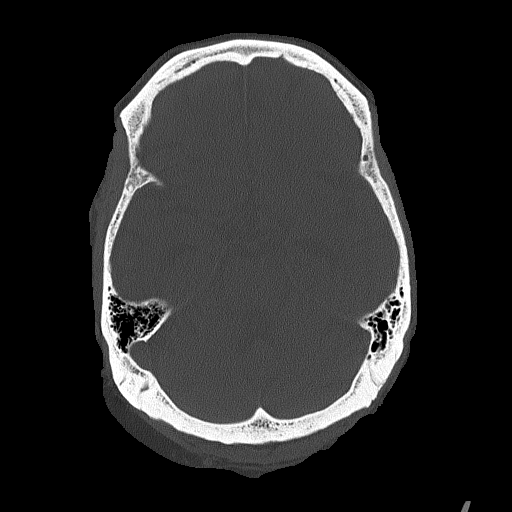

[Series 4: coronal soft tissue · coronal · 0.33mm/px · 3 of 69 slices shown]
[im 23/69  brain]
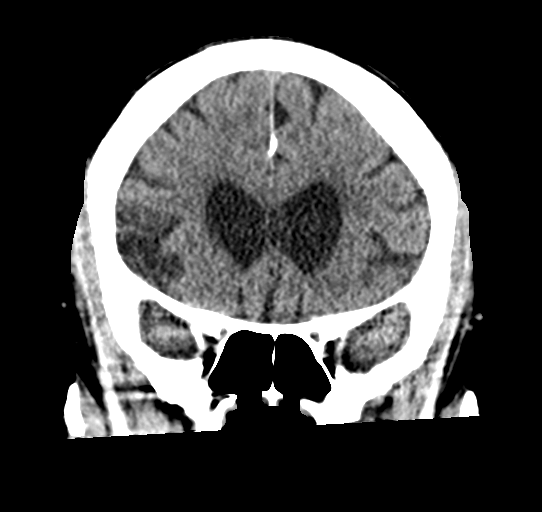
[im 31/69  brain]
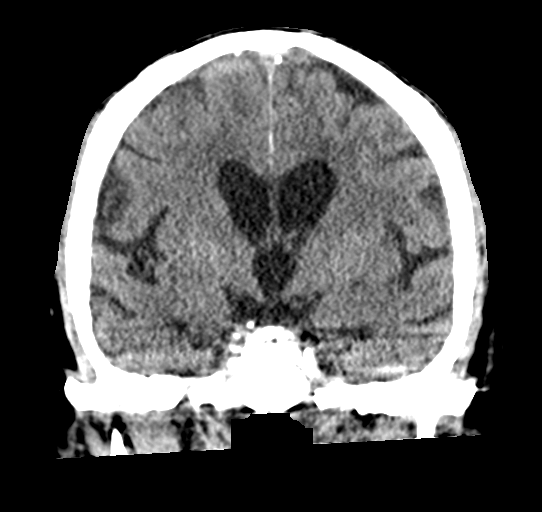
[im 38/69  brain]
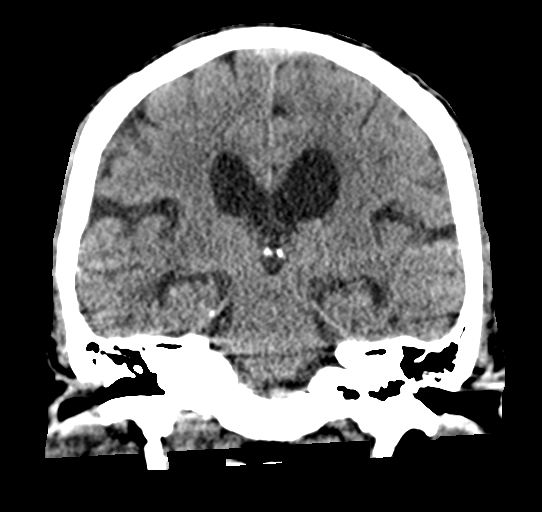

[Series 5: sagittal soft tissue · sagittal · 0.33mm/px · 3 of 60 slices shown]
[im 20/60  brain]
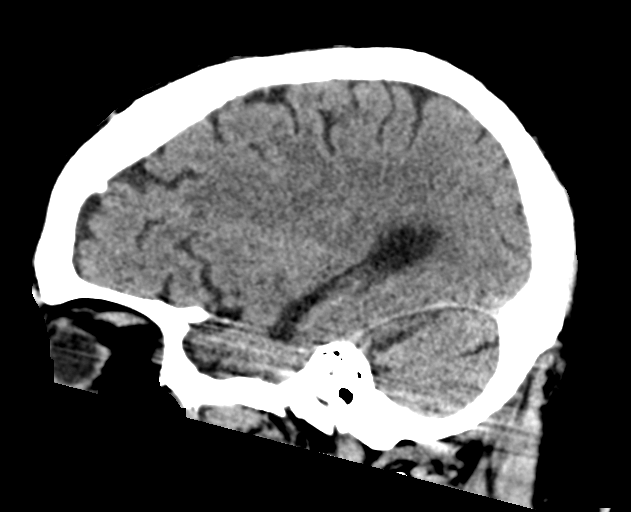
[im 30/60  brain]
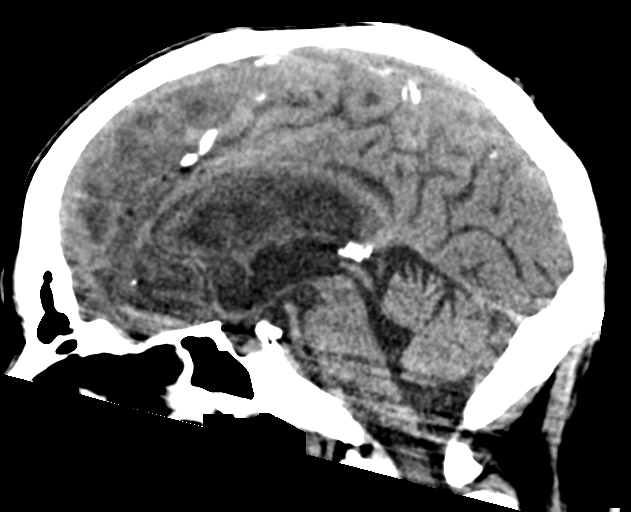
[im 40/60  brain]
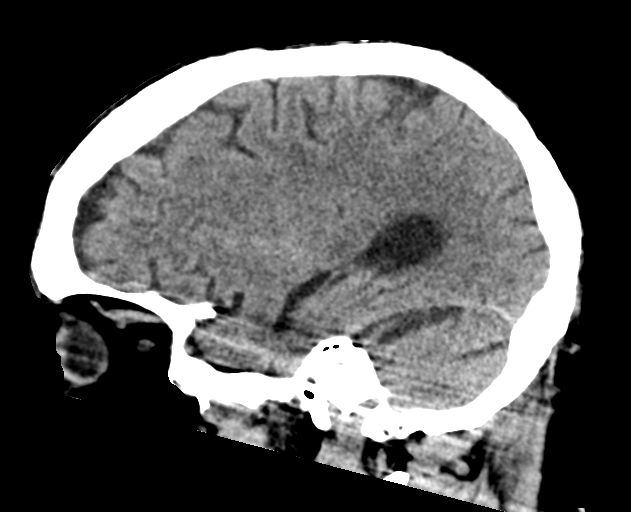

[16 of 47 positions shown; findings below may reference images not displayed]

FINDINGS: Brain: No acute infarct or hemorrhage. Stable cerebral atrophy.
Lateral ventricles and midline structures are unremarkable. No acute
extra-axial fluid collections. No mass effect.

Vascular: No hyperdense vessel or unexpected calcification.

Skull: Normal. Negative for fracture or focal lesion. Stable
sebaceous cyst right parietal scalp.

Sinuses/Orbits: No acute finding.

Other: None.
IMPRESSION: 1. Stable head CT, no acute intracranial process.
